# Patient Record
Sex: Female | Born: 1940
Health system: Southern US, Community
[De-identification: ages and names within clinical notes are randomized; demographics above are authoritative.]

## PROBLEM LIST (undated history)

## (undated) DIAGNOSIS — G43909 Migraine, unspecified, not intractable, without status migrainosus: Secondary | ICD-10-CM

## (undated) DIAGNOSIS — D72819 Decreased white blood cell count, unspecified: Secondary | ICD-10-CM

## (undated) DIAGNOSIS — N3281 Overactive bladder: Secondary | ICD-10-CM

## (undated) DIAGNOSIS — D72820 Lymphocytosis (symptomatic): Secondary | ICD-10-CM

## (undated) DIAGNOSIS — M810 Age-related osteoporosis without current pathological fracture: Secondary | ICD-10-CM

## (undated) DIAGNOSIS — I480 Paroxysmal atrial fibrillation: Secondary | ICD-10-CM

## (undated) DIAGNOSIS — I071 Rheumatic tricuspid insufficiency: Secondary | ICD-10-CM

## (undated) DIAGNOSIS — E559 Vitamin D deficiency, unspecified: Secondary | ICD-10-CM

## (undated) DIAGNOSIS — I340 Nonrheumatic mitral (valve) insufficiency: Secondary | ICD-10-CM

## (undated) DIAGNOSIS — Z9289 Personal history of other medical treatment: Secondary | ICD-10-CM

## (undated) DIAGNOSIS — N952 Postmenopausal atrophic vaginitis: Secondary | ICD-10-CM

## (undated) DIAGNOSIS — R079 Chest pain, unspecified: Secondary | ICD-10-CM

## (undated) DIAGNOSIS — F419 Anxiety disorder, unspecified: Secondary | ICD-10-CM

## (undated) DIAGNOSIS — I712 Thoracic aortic aneurysm, without rupture, unspecified: Secondary | ICD-10-CM

## (undated) DIAGNOSIS — E785 Hyperlipidemia, unspecified: Secondary | ICD-10-CM

## (undated) DIAGNOSIS — R7303 Prediabetes: Secondary | ICD-10-CM

## (undated) DIAGNOSIS — I10 Essential (primary) hypertension: Secondary | ICD-10-CM

## (undated) DIAGNOSIS — F32A Depression, unspecified: Secondary | ICD-10-CM

## (undated) DIAGNOSIS — I421 Obstructive hypertrophic cardiomyopathy: Secondary | ICD-10-CM

## (undated) DIAGNOSIS — K635 Polyp of colon: Secondary | ICD-10-CM

## (undated) DIAGNOSIS — I442 Atrioventricular block, complete: Secondary | ICD-10-CM

## (undated) DIAGNOSIS — I447 Left bundle-branch block, unspecified: Secondary | ICD-10-CM

## (undated) DIAGNOSIS — K219 Gastro-esophageal reflux disease without esophagitis: Secondary | ICD-10-CM

## (undated) DIAGNOSIS — I77810 Thoracic aortic ectasia: Secondary | ICD-10-CM

## (undated) DIAGNOSIS — F329 Major depressive disorder, single episode, unspecified: Secondary | ICD-10-CM

## (undated) HISTORY — DX: Lymphocytosis (symptomatic): D72.820

## (undated) HISTORY — DX: Polyp of colon: K63.5

## (undated) HISTORY — DX: Paroxysmal atrial fibrillation: I48.0

## (undated) HISTORY — PX: BREAST EXCISIONAL BIOPSY: SUR124

## (undated) HISTORY — DX: Thoracic aortic aneurysm, without rupture: I71.2

## (undated) HISTORY — DX: Thoracic aortic aneurysm, without rupture, unspecified: I71.20

## (undated) HISTORY — DX: Anxiety disorder, unspecified: F41.9

## (undated) HISTORY — DX: Prediabetes: R73.03

## (undated) HISTORY — PX: HEMORRHOID SURGERY: SHX153

## (undated) HISTORY — PX: TUBAL LIGATION: SHX77

## (undated) HISTORY — DX: Postmenopausal atrophic vaginitis: N95.2

## (undated) HISTORY — DX: Migraine, unspecified, not intractable, without status migrainosus: G43.909

## (undated) HISTORY — PX: BREAST SURGERY: SHX581

## (undated) HISTORY — DX: Overactive bladder: N32.81

## (undated) HISTORY — DX: Personal history of other medical treatment: Z92.89

## (undated) HISTORY — DX: Nonrheumatic mitral (valve) insufficiency: I34.0

## (undated) HISTORY — DX: Rheumatic tricuspid insufficiency: I07.1

## (undated) HISTORY — DX: Decreased white blood cell count, unspecified: D72.819

## (undated) HISTORY — DX: Age-related osteoporosis without current pathological fracture: M81.0

## (undated) HISTORY — DX: Obstructive hypertrophic cardiomyopathy: I42.1

## (undated) HISTORY — DX: Vitamin D deficiency, unspecified: E55.9

## (undated) HISTORY — DX: Thoracic aortic ectasia: I77.810

---

## 1998-07-20 ENCOUNTER — Encounter: Payer: Self-pay | Admitting: Emergency Medicine

## 1998-07-20 ENCOUNTER — Emergency Department (HOSPITAL_COMMUNITY): Admission: EM | Admit: 1998-07-20 | Discharge: 1998-07-20 | Payer: Self-pay | Admitting: Emergency Medicine

## 1998-09-23 ENCOUNTER — Ambulatory Visit (HOSPITAL_COMMUNITY): Admission: RE | Admit: 1998-09-23 | Discharge: 1998-09-23 | Payer: Self-pay | Admitting: Obstetrics and Gynecology

## 1998-09-23 ENCOUNTER — Encounter: Payer: Self-pay | Admitting: Obstetrics and Gynecology

## 1999-06-09 ENCOUNTER — Ambulatory Visit (HOSPITAL_BASED_OUTPATIENT_CLINIC_OR_DEPARTMENT_OTHER): Admission: RE | Admit: 1999-06-09 | Discharge: 1999-06-09 | Payer: Self-pay | Admitting: *Deleted

## 1999-09-21 ENCOUNTER — Ambulatory Visit (HOSPITAL_COMMUNITY): Admission: RE | Admit: 1999-09-21 | Discharge: 1999-09-21 | Payer: Self-pay | Admitting: Internal Medicine

## 1999-09-21 ENCOUNTER — Encounter: Payer: Self-pay | Admitting: Internal Medicine

## 1999-09-22 ENCOUNTER — Other Ambulatory Visit: Admission: RE | Admit: 1999-09-22 | Discharge: 1999-09-22 | Payer: Self-pay | Admitting: Obstetrics and Gynecology

## 1999-09-28 ENCOUNTER — Ambulatory Visit (HOSPITAL_COMMUNITY): Admission: RE | Admit: 1999-09-28 | Discharge: 1999-09-28 | Payer: Self-pay | Admitting: Obstetrics and Gynecology

## 1999-09-28 ENCOUNTER — Encounter: Payer: Self-pay | Admitting: Obstetrics and Gynecology

## 2000-06-20 ENCOUNTER — Encounter: Admission: RE | Admit: 2000-06-20 | Discharge: 2000-06-20 | Payer: Self-pay | Admitting: Obstetrics and Gynecology

## 2000-06-20 ENCOUNTER — Encounter: Payer: Self-pay | Admitting: Obstetrics and Gynecology

## 2000-09-21 ENCOUNTER — Other Ambulatory Visit: Admission: RE | Admit: 2000-09-21 | Discharge: 2000-09-21 | Payer: Self-pay | Admitting: Obstetrics and Gynecology

## 2000-10-04 ENCOUNTER — Ambulatory Visit (HOSPITAL_COMMUNITY): Admission: RE | Admit: 2000-10-04 | Discharge: 2000-10-04 | Payer: Self-pay | Admitting: Obstetrics and Gynecology

## 2000-10-04 ENCOUNTER — Encounter: Payer: Self-pay | Admitting: Obstetrics and Gynecology

## 2001-09-26 ENCOUNTER — Other Ambulatory Visit: Admission: RE | Admit: 2001-09-26 | Discharge: 2001-09-26 | Payer: Self-pay | Admitting: Obstetrics and Gynecology

## 2001-10-08 ENCOUNTER — Ambulatory Visit (HOSPITAL_COMMUNITY): Admission: RE | Admit: 2001-10-08 | Discharge: 2001-10-08 | Payer: Self-pay | Admitting: Obstetrics and Gynecology

## 2001-10-08 ENCOUNTER — Encounter: Payer: Self-pay | Admitting: Obstetrics and Gynecology

## 2001-10-26 ENCOUNTER — Ambulatory Visit (HOSPITAL_COMMUNITY): Admission: RE | Admit: 2001-10-26 | Discharge: 2001-10-26 | Payer: Self-pay | Admitting: Gastroenterology

## 2002-09-02 ENCOUNTER — Emergency Department (HOSPITAL_COMMUNITY): Admission: EM | Admit: 2002-09-02 | Discharge: 2002-09-02 | Payer: Self-pay | Admitting: Emergency Medicine

## 2002-09-02 ENCOUNTER — Encounter: Payer: Self-pay | Admitting: Emergency Medicine

## 2002-10-14 ENCOUNTER — Encounter: Payer: Self-pay | Admitting: Obstetrics and Gynecology

## 2002-10-14 ENCOUNTER — Ambulatory Visit (HOSPITAL_COMMUNITY): Admission: RE | Admit: 2002-10-14 | Discharge: 2002-10-14 | Payer: Self-pay | Admitting: Obstetrics and Gynecology

## 2002-11-14 ENCOUNTER — Other Ambulatory Visit: Admission: RE | Admit: 2002-11-14 | Discharge: 2002-11-14 | Payer: Self-pay | Admitting: Obstetrics and Gynecology

## 2002-11-19 ENCOUNTER — Encounter: Payer: Self-pay | Admitting: Obstetrics and Gynecology

## 2002-11-19 ENCOUNTER — Encounter: Admission: RE | Admit: 2002-11-19 | Discharge: 2002-11-19 | Payer: Self-pay | Admitting: Obstetrics and Gynecology

## 2002-12-24 ENCOUNTER — Encounter: Payer: Self-pay | Admitting: Internal Medicine

## 2002-12-24 ENCOUNTER — Ambulatory Visit (HOSPITAL_COMMUNITY): Admission: RE | Admit: 2002-12-24 | Discharge: 2002-12-24 | Payer: Self-pay | Admitting: Internal Medicine

## 2003-01-10 ENCOUNTER — Encounter: Payer: Self-pay | Admitting: Internal Medicine

## 2003-01-10 ENCOUNTER — Ambulatory Visit (HOSPITAL_COMMUNITY): Admission: RE | Admit: 2003-01-10 | Discharge: 2003-01-10 | Payer: Self-pay | Admitting: Internal Medicine

## 2003-07-15 ENCOUNTER — Encounter: Payer: Self-pay | Admitting: Gastroenterology

## 2003-07-15 ENCOUNTER — Encounter: Admission: RE | Admit: 2003-07-15 | Discharge: 2003-07-15 | Payer: Self-pay | Admitting: Gastroenterology

## 2003-07-31 ENCOUNTER — Ambulatory Visit (HOSPITAL_COMMUNITY): Admission: RE | Admit: 2003-07-31 | Discharge: 2003-07-31 | Payer: Self-pay | Admitting: Gastroenterology

## 2003-10-16 ENCOUNTER — Ambulatory Visit (HOSPITAL_COMMUNITY): Admission: RE | Admit: 2003-10-16 | Discharge: 2003-10-16 | Payer: Self-pay | Admitting: Obstetrics and Gynecology

## 2003-11-17 ENCOUNTER — Other Ambulatory Visit: Admission: RE | Admit: 2003-11-17 | Discharge: 2003-11-17 | Payer: Self-pay | Admitting: Obstetrics and Gynecology

## 2004-01-28 ENCOUNTER — Ambulatory Visit (HOSPITAL_COMMUNITY): Admission: RE | Admit: 2004-01-28 | Discharge: 2004-01-28 | Payer: Self-pay | Admitting: Internal Medicine

## 2004-10-18 ENCOUNTER — Ambulatory Visit (HOSPITAL_COMMUNITY): Admission: RE | Admit: 2004-10-18 | Discharge: 2004-10-18 | Payer: Self-pay | Admitting: Obstetrics and Gynecology

## 2004-11-17 ENCOUNTER — Other Ambulatory Visit: Admission: RE | Admit: 2004-11-17 | Discharge: 2004-11-17 | Payer: Self-pay | Admitting: Obstetrics and Gynecology

## 2005-11-09 ENCOUNTER — Ambulatory Visit (HOSPITAL_COMMUNITY): Admission: RE | Admit: 2005-11-09 | Discharge: 2005-11-09 | Payer: Self-pay | Admitting: Obstetrics and Gynecology

## 2005-11-24 ENCOUNTER — Other Ambulatory Visit: Admission: RE | Admit: 2005-11-24 | Discharge: 2005-11-24 | Payer: Self-pay | Admitting: Obstetrics and Gynecology

## 2006-11-24 ENCOUNTER — Ambulatory Visit (HOSPITAL_COMMUNITY): Admission: RE | Admit: 2006-11-24 | Discharge: 2006-11-24 | Payer: Self-pay | Admitting: Obstetrics and Gynecology

## 2007-01-25 ENCOUNTER — Ambulatory Visit (HOSPITAL_COMMUNITY): Admission: RE | Admit: 2007-01-25 | Discharge: 2007-01-25 | Payer: Self-pay | Admitting: Internal Medicine

## 2007-03-21 ENCOUNTER — Ambulatory Visit (HOSPITAL_COMMUNITY): Admission: RE | Admit: 2007-03-21 | Discharge: 2007-03-21 | Payer: Self-pay | Admitting: Obstetrics and Gynecology

## 2007-11-26 ENCOUNTER — Ambulatory Visit (HOSPITAL_COMMUNITY): Admission: RE | Admit: 2007-11-26 | Discharge: 2007-11-26 | Payer: Self-pay | Admitting: Obstetrics and Gynecology

## 2008-01-28 ENCOUNTER — Other Ambulatory Visit: Admission: RE | Admit: 2008-01-28 | Discharge: 2008-01-28 | Payer: Self-pay | Admitting: Obstetrics and Gynecology

## 2008-05-22 ENCOUNTER — Ambulatory Visit (HOSPITAL_COMMUNITY): Admission: RE | Admit: 2008-05-22 | Discharge: 2008-05-22 | Payer: Self-pay | Admitting: Internal Medicine

## 2008-05-29 ENCOUNTER — Ambulatory Visit (HOSPITAL_COMMUNITY): Admission: RE | Admit: 2008-05-29 | Discharge: 2008-05-29 | Payer: Self-pay | Admitting: Internal Medicine

## 2008-10-08 LAB — HM DEXA SCAN

## 2008-11-28 ENCOUNTER — Ambulatory Visit (HOSPITAL_COMMUNITY): Admission: RE | Admit: 2008-11-28 | Discharge: 2008-11-28 | Payer: Self-pay | Admitting: Obstetrics and Gynecology

## 2009-01-28 ENCOUNTER — Ambulatory Visit: Payer: Self-pay | Admitting: Obstetrics and Gynecology

## 2009-11-30 ENCOUNTER — Ambulatory Visit (HOSPITAL_COMMUNITY): Admission: RE | Admit: 2009-11-30 | Discharge: 2009-11-30 | Payer: Self-pay | Admitting: Obstetrics and Gynecology

## 2010-01-29 ENCOUNTER — Other Ambulatory Visit: Admission: RE | Admit: 2010-01-29 | Discharge: 2010-01-29 | Payer: Self-pay | Admitting: Obstetrics and Gynecology

## 2010-01-29 ENCOUNTER — Ambulatory Visit: Payer: Self-pay | Admitting: Obstetrics and Gynecology

## 2010-02-15 ENCOUNTER — Ambulatory Visit: Payer: Self-pay | Admitting: Obstetrics and Gynecology

## 2010-03-30 ENCOUNTER — Ambulatory Visit: Payer: Self-pay | Admitting: Obstetrics and Gynecology

## 2010-12-02 ENCOUNTER — Ambulatory Visit (HOSPITAL_COMMUNITY)
Admission: RE | Admit: 2010-12-02 | Discharge: 2010-12-02 | Payer: Self-pay | Source: Home / Self Care | Attending: Obstetrics and Gynecology | Admitting: Obstetrics and Gynecology

## 2011-03-25 NOTE — Op Note (Signed)
[place]. [facility]  Patient:    Alexandra Wheeler, Alexandra Wheeler Visit Number: [id number] MRN: [id number]          Service Type: END Location: ENDO Attending Physician:  Rich Brave Dictated by:   Florencia Reasons, M.D. Proc. Date: 10/26/01 Admit Date:  10/26/2001 Discharge Date: 10/26/2001   CC:         Marinus Maw, M.D.   Operative Report  PROCEDURE:  Colonoscopy.  INDICATIONS:  A 70 year old female for colon cancer screening.  FINDINGS:  Normal examination (redundant colon).  DESCRIPTION OF PROCEDURE:  The nature, purpose, and risks of the procedure had been discussed with the patient who provided written consent.  Sedation was Demerol 100 mg and Versed 10 mg IV without arrhythmias or desaturation.  The procedure was initiated with the Olympus pediatric adjustable tension video colonoscope.  However, despite full scope insertion, having the patient in the supine and right lateral decubitus positions, and applying external abdominal compression, I was not able to reach the base of the cecum with that scope or even the level of the ileocecal valve, so the scope was withdrawn.  I saw no abnormalities during withdrawal.  The patient was then recolonoscoped with the adult video colonoscope and this was able to reach the level of the ileocecal valve, as confirmed by transillumination of the right lower quadrant, and I was able to look down into the base of the cecum and see virtually the entire cecal surface area.  So it is not felt that any significant lesions would have been missed.  Pullback was then performed.  There was quite a bit of colonic redundancy and looping noted during this examination.  Otherwise, however, the examination was normal.  No polyps, cancer, colitis, vascular malformations, or diverticulosis were noted.  Retroflexion was attempted in the rectum, but could not be accomplished.  Due to a fairly small rectal ampulla.   No biopsies were obtained. The patient tolerated the procedure well and there were no apparent complications.  IMPRESSION:  Redundant colon, but otherwise normal examination.  PLAN:  Consider follow-up screening colonoscopy in five years. Dictated by:   Florencia Reasons, M.D. Attending Physician:  Rich Brave DD:  10/26/01 TD:  10/28/01 Job: [id number]

## 2011-03-25 NOTE — Op Note (Signed)
   NAME:  Alexandra Wheeler, HUSH                        ACCOUNT NO.:  [id number]   MEDICAL RECORD NO.:  [id number]                   PATIENT TYPE:  AMB   LOCATION:  ENDO                                 FACILITY:  [facility]   PHYSICIAN:  Bernette Redbird, M.D.                DATE OF BIRTH:  1941/10/01   DATE OF PROCEDURE:  07/31/2003  DATE OF DISCHARGE:                                 OPERATIVE REPORT   PROCEDURE:  Upper endoscopy.   INDICATION:  A 70 year old female with recurring chest pain unresponsive to  PPI therapy.   FINDINGS:  Possible laryngeal inflammation, otherwise normal exam.   DESCRIPTION OF PROCEDURE:  The nature, purpose, and risks of the procedure  were familiar to the patient from prior examination.  She provided written  consent.  Sedation was fentanyl 75 mcg and Versed 7 mg IV without  arrhythmias or desaturation.  The Olympus small-caliber adult video  endoscope was passed easily under direct vision.   The vocal cords, in particular the false cords, appeared prominent without  any discrete lesions, and the posterior portion of the larynx appeared boggy  and edematous.   The esophagus was entered under direct vision without significant difficulty  and had normal mucosa, no evidence of spasm, no reflux esophagitis, no free  reflux, no Barrett's esophagus, no varices, infection, or neoplasia and no  ring, stricture, or hiatal hernia.   The stomach contained no significant residual and had normal mucosa without  evidence of gastritis, erosions, ulcers, polyps, or masses including  retroflexed view of the proximal stomach.  The pylorus and duodenum appeared  to __________ to one foreshortened channel, although there was not classic  peptic scarring present.  The second portion of the duodenum looked normal.   The scope was removed from the patient.  No biopsies were obtained.  She  tolerated the procedure well, and there were no apparent complications.   IMPRESSION:   Essentially normal exam except for possible chronic laryngeal  inflammation.    PLAN:  Continue proton pump inhibitor therapy for now.  Consider possible  ENT evaluation.  Since no source of chest pain was identified on this exam,  the patient will need to continue to be followed by her cardiologist and her  primary care physician.                                               Bernette Redbird, M.D.    RB/MEDQ  D:  07/31/2003  T:  07/31/2003  Job:  [id number]   cc:   Lucky Cowboy, M.D.  [address]  [place], [place] [address]  Fax: [phone]   Nicki Guadalajara, M.D.  [phone] [address]  [place], [place] [address]  Fax: [phone]

## 2011-04-05 ENCOUNTER — Encounter (INDEPENDENT_AMBULATORY_CARE_PROVIDER_SITE_OTHER): Payer: Medicare PPO | Admitting: Obstetrics and Gynecology

## 2011-04-05 DIAGNOSIS — N3941 Urge incontinence: Secondary | ICD-10-CM

## 2011-04-05 DIAGNOSIS — M810 Age-related osteoporosis without current pathological fracture: Secondary | ICD-10-CM

## 2011-04-05 DIAGNOSIS — N952 Postmenopausal atrophic vaginitis: Secondary | ICD-10-CM

## 2011-04-05 DIAGNOSIS — N393 Stress incontinence (female) (male): Secondary | ICD-10-CM

## 2011-06-08 ENCOUNTER — Encounter: Payer: Self-pay | Admitting: Obstetrics and Gynecology

## 2011-06-10 ENCOUNTER — Telehealth: Payer: Self-pay

## 2011-06-10 ENCOUNTER — Encounter: Payer: Self-pay | Admitting: Obstetrics and Gynecology

## 2011-06-10 NOTE — Telephone Encounter (Signed)
Message copied by Venora Maples on Fri Jun 10, 2011  3:14 PM ------      Message from: Trellis Paganini      Created: Fri Jun 10, 2011  1:01 PM       Urine culture is normal. Is she symptomatic?

## 2011-06-10 NOTE — Telephone Encounter (Signed)
PT. NOTIFIED BY VOICEMAIL  PER DR. GOTTSEGENS NOTE BELOW & TOLD HER TO CALL BACK & LET ME KNOW EITHER WAY.

## 2011-11-17 ENCOUNTER — Other Ambulatory Visit: Payer: Self-pay | Admitting: Obstetrics and Gynecology

## 2011-11-17 DIAGNOSIS — Z1231 Encounter for screening mammogram for malignant neoplasm of breast: Secondary | ICD-10-CM

## 2011-12-15 ENCOUNTER — Ambulatory Visit (HOSPITAL_COMMUNITY)
Admission: RE | Admit: 2011-12-15 | Discharge: 2011-12-15 | Disposition: A | Discharge status: Home / Self Care | Payer: Medicare PPO | Source: Ambulatory Visit | Attending: Obstetrics and Gynecology | Admitting: Obstetrics and Gynecology

## 2011-12-15 DIAGNOSIS — Z1231 Encounter for screening mammogram for malignant neoplasm of breast: Secondary | ICD-10-CM

## 2012-09-25 ENCOUNTER — Encounter: Payer: Self-pay | Admitting: Gynecology

## 2012-09-25 DIAGNOSIS — N952 Postmenopausal atrophic vaginitis: Secondary | ICD-10-CM | POA: Insufficient documentation

## 2012-09-25 DIAGNOSIS — N3281 Overactive bladder: Secondary | ICD-10-CM | POA: Insufficient documentation

## 2012-09-25 DIAGNOSIS — M810 Age-related osteoporosis without current pathological fracture: Secondary | ICD-10-CM | POA: Insufficient documentation

## 2012-10-03 ENCOUNTER — Ambulatory Visit (INDEPENDENT_AMBULATORY_CARE_PROVIDER_SITE_OTHER): Payer: Medicare PPO | Admitting: Obstetrics and Gynecology

## 2012-10-03 ENCOUNTER — Encounter: Payer: Self-pay | Admitting: Obstetrics and Gynecology

## 2012-10-03 VITALS — BP 114/72 | Ht 65.75 in | Wt 153.0 lb

## 2012-10-03 DIAGNOSIS — M810 Age-related osteoporosis without current pathological fracture: Secondary | ICD-10-CM

## 2012-10-03 DIAGNOSIS — R300 Dysuria: Secondary | ICD-10-CM

## 2012-10-03 DIAGNOSIS — R3915 Urgency of urination: Secondary | ICD-10-CM

## 2012-10-03 DIAGNOSIS — N952 Postmenopausal atrophic vaginitis: Secondary | ICD-10-CM

## 2012-10-03 LAB — URINALYSIS W MICROSCOPIC + REFLEX CULTURE
Bilirubin Urine: NEGATIVE
Casts: NONE SEEN
Crystals: NONE SEEN
Glucose, UA: NEGATIVE mg/dL
Ketones, ur: NEGATIVE mg/dL
Nitrite: NEGATIVE
Protein, ur: NEGATIVE mg/dL
Specific Gravity, Urine: 1.015 (ref 1.005–1.030)
Urobilinogen, UA: 0.2 mg/dL (ref 0.0–1.0)
pH: 7.5 (ref 5.0–8.0)

## 2012-10-03 MED ORDER — NITROFURANTOIN MONOHYD MACRO 100 MG PO CAPS: 100.0000 mg | ORAL_CAPSULE | Freq: Two times a day (BID) | ORAL | Status: DC

## 2012-10-03 NOTE — Progress Notes (Signed)
Patient came to see me today. She is complaining of severe dysuria and vaginal burning. She had an abnormal urinalysis. She also has urinary urgency which has been a long-term problem. She is not on medication at the moment for this. She does not have hematuria or incontinence. She she does have vaginal burning. She is not sexually active and therefore does not have dyspareunia. She is having no vaginal bleeding or pelvic pain. She is up-to-date on her mammograms.She previously took  Evista and Fosamax for osteoporosis. She did not take her Fosamax long because of side effects. She tried one year of IV Reclast but had severe joint pain and would not do it again. We have discussed Prolia but she did not follow through. Her last bone density was 2004 and she has not had one since then even though we have requested her to have followup. She has had no fractures. She has always had normal Pap smears. Her last Pap smear was 2011.  ROS: 12 system review done. Pertinent positives above.  Physical examination:Alexandra Wheeler present. HEENT within normal limits. Neck: Thyroid not large. No masses. Supraclavicular nodes: not enlarged. Breasts: Examined in both sitting and lying  position. No skin changes and no masses. Abdomen: Soft no guarding rebound or masses or hernia. Pelvic: External: Within normal limits. BUS: Within normal limits. Vaginal:within normal limits. Poor  estrogen effect. No evidence of cystocele rectocele or enterocele. Cervix: clean. Uterus: Normal size and shape. Adnexa: No masses. Rectovaginal exam: Confirmatory and negative. Extremities: Within normal limits.  Assessment: #1. Urinary tract infection #2. Atrophic vaginitis #3. Osteoporosis #4. Detrussor instability  Plan: Macrobid twice a day for 7 days. Followup urinalysis. Hyalo  GYN gel. Bone density. Discussed Prolia based on bone density results. Patient will call for DI treatment when necessary. Pap not done.The new Pap smear  guidelines were discussed with the patient.

## 2012-10-03 NOTE — Patient Instructions (Signed)
Schedule  bone density. Return in one week for urinalysis.

## 2012-10-04 LAB — URINE CULTURE: Colony Count: 4000

## 2012-10-08 ENCOUNTER — Encounter: Payer: Self-pay | Admitting: Obstetrics and Gynecology

## 2012-10-18 ENCOUNTER — Encounter: Payer: Self-pay | Admitting: Obstetrics and Gynecology

## 2012-10-19 ENCOUNTER — Other Ambulatory Visit: Payer: Self-pay | Admitting: Internal Medicine

## 2012-10-19 DIAGNOSIS — R531 Weakness: Secondary | ICD-10-CM

## 2012-10-19 DIAGNOSIS — R410 Disorientation, unspecified: Secondary | ICD-10-CM

## 2012-10-24 ENCOUNTER — Ambulatory Visit
Admission: RE | Admit: 2012-10-24 | Discharge: 2012-10-24 | Disposition: A | Discharge status: Home / Self Care | Payer: Medicare PPO | Source: Ambulatory Visit | Attending: Internal Medicine | Admitting: Internal Medicine

## 2012-10-24 DIAGNOSIS — R410 Disorientation, unspecified: Secondary | ICD-10-CM

## 2012-10-24 DIAGNOSIS — R531 Weakness: Secondary | ICD-10-CM

## 2012-11-15 ENCOUNTER — Encounter: Payer: Self-pay | Admitting: Cardiology

## 2012-11-15 ENCOUNTER — Ambulatory Visit (INDEPENDENT_AMBULATORY_CARE_PROVIDER_SITE_OTHER): Payer: Medicare PPO | Admitting: Cardiology

## 2012-11-15 VITALS — BP 120/86 | HR 60 | Ht 65.75 in | Wt 153.0 lb

## 2012-11-15 DIAGNOSIS — R011 Cardiac murmur, unspecified: Secondary | ICD-10-CM

## 2012-11-15 DIAGNOSIS — R079 Chest pain, unspecified: Secondary | ICD-10-CM

## 2012-11-15 DIAGNOSIS — I447 Left bundle-branch block, unspecified: Secondary | ICD-10-CM | POA: Insufficient documentation

## 2012-11-15 NOTE — Progress Notes (Signed)
Patient ID: Alexandra Wheeler, female   DOB: 11-Nov-1940, 72 y.o.   MRN: [id number] PCP: Dr. Oneta Rack  72 yo with history of chronic LBBB presents for cardiology evaluation.  She has two main complaints: left hand tingling and left lateral chest pain.  She will get tingling from time to time only in her left hand.  It feels numb.  This is not associated with exertion.  She also has pain in her left lateral chest and shoulder that occurs every 1-2 weeks. There is no definite trigger.  It is not exertional.  She also reports significant generalized fatigue that has begun over the last few month.  Lab evaluation at PCP's office showed normal hemoglobin and TSH.  She has a LBBB on ECG today but this has been seen on prior ECGs.  No exertional dyspnea but not much formal exercise.   ECG: NSR, LBBB  Labs (12/13): K 4.1, creatinine 0.67, HCT 38.1, TSH normal, LDL 74, HDL 52, TGs 68  PMH: 1. LBBB 2. GERD 3. HTN: Not currently on medications.  4. Depression 5. Hyperlipidemia  SH: Lives in [place].  Quit smoking in 1990s.  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    FH: 2 brothers with CABG in their 73s, mother with PCM.  ROS: All systems reviewed and negative except as per HPI.   Current Outpatient Prescriptions  Medication Sig Dispense Refill  . ALPRAZolam (XANAX) 0.5 MG tablet Take 0.5 mg by mouth at bedtime as needed.      . Ascorbic Acid (VITAMIN C PO) Take by mouth daily.      Marland Kitchen BIOTIN PO Take by mouth daily.      . Calcium Carbonate-Vitamin D (CALCIUM + D PO) Take by mouth.      . Cholecalciferol (VITAMIN D PO) Take by mouth.      . citalopram (CELEXA) 10 MG tablet Take 10 mg by mouth daily.      Marland Kitchen FOLIC ACID PO Take by mouth daily.      Marland Kitchen MAGNESIUM PO Take by mouth.      . Multiple Vitamin (MULTIVITAMIN) tablet Take 1 tablet by mouth daily.      . Omega-3 Fatty Acids (FISH OIL PO) Take by mouth.      . Red Yeast Rice Extract (RED YEAST RICE PO) Take by mouth.        BP  120/86  Pulse 60  Ht 5' 5.75" (1.67 m)  Wt 153 lb (69.4 kg)  BMI 24.88 kg/m2  SpO2 96% General: NAD Neck: No JVD, no thyromegaly or thyroid nodule.  Lungs: Clear to auscultation bilaterally with normal respiratory effort. CV: Nondisplaced PMI.  Heart regular S1/S2, no S3/S4, 2/6 early systolic murmur RUSB.  No peripheral edema.  No carotid bruit.  Normal pedal pulses.  Abdomen: Soft, nontender, no hepatosplenomegaly, no distention.  Skin: Intact without lesions or rashes.  Neurologic: Alert and oriented x 3.  Psych: Normal affect. Extremities: No clubbing or cyanosis.  HEENT: Normal.   Assessment/Plan: 1. Murmur: Patient has an aortic-area systolic murmur.  I will get an echocardiogram to assess for aortic valve pathology.  2. LBBB: It appears that this is chronic and unchanged compared to prior ECGs.  3. Chest pain: Patient has fairly frequent episodes of atypical chest pain.  These episodes concern her because she had 2 brothers with CABG in their 32s.  I think that it would be reasonable to set her up for an adenosine Sestamibi given her  chest pain and fatigue.  Will need to be pharmacologic test due to LBBB.   Marca Ancona 11/15/2012 1:24 PM

## 2012-11-15 NOTE — Patient Instructions (Addendum)
Your physician has requested that you have an adenosine myoview. For further information please visit [email or web address] Please follow instruction sheet, as given.  Your physician has requested that you have an echocardiogram. Echocardiography is a painless test that uses sound waves to create images of your heart. It provides your doctor with information about the size and shape of your heart and how well your heart's chambers and valves are working. This procedure takes approximately one hour. There are no restrictions for this procedure.  Your physician recommends that you schedule a follow-up appointment in: 2-3 weeks with Dr Shirlee Latch.

## 2012-11-26 ENCOUNTER — Ambulatory Visit (HOSPITAL_COMMUNITY): Payer: Medicare PPO | Attending: Cardiology | Admitting: Radiology

## 2012-11-26 VITALS — Ht 65.0 in | Wt 151.0 lb

## 2012-11-26 DIAGNOSIS — I10 Essential (primary) hypertension: Secondary | ICD-10-CM | POA: Insufficient documentation

## 2012-11-26 DIAGNOSIS — R0609 Other forms of dyspnea: Secondary | ICD-10-CM | POA: Insufficient documentation

## 2012-11-26 DIAGNOSIS — R079 Chest pain, unspecified: Secondary | ICD-10-CM | POA: Insufficient documentation

## 2012-11-26 DIAGNOSIS — R002 Palpitations: Secondary | ICD-10-CM | POA: Insufficient documentation

## 2012-11-26 DIAGNOSIS — I447 Left bundle-branch block, unspecified: Secondary | ICD-10-CM | POA: Insufficient documentation

## 2012-11-26 DIAGNOSIS — R42 Dizziness and giddiness: Secondary | ICD-10-CM | POA: Insufficient documentation

## 2012-11-26 DIAGNOSIS — R0989 Other specified symptoms and signs involving the circulatory and respiratory systems: Secondary | ICD-10-CM | POA: Insufficient documentation

## 2012-11-26 MED ORDER — TECHNETIUM TC 99M SESTAMIBI GENERIC - CARDIOLITE
11.0000 | Freq: Once | INTRAVENOUS | Status: AC | PRN
  Administered 2012-11-26: 11 via INTRAVENOUS

## 2012-11-26 MED ORDER — ADENOSINE (DIAGNOSTIC) 3 MG/ML IV SOLN
0.5600 mg/kg | Freq: Once | INTRAVENOUS | Status: AC
  Administered 2012-11-26: 38.4 mg via INTRAVENOUS

## 2012-11-26 MED ORDER — TECHNETIUM TC 99M SESTAMIBI GENERIC - CARDIOLITE
33.0000 | Freq: Once | INTRAVENOUS | Status: AC | PRN
  Administered 2012-11-26: 33 via INTRAVENOUS

## 2012-11-26 NOTE — Progress Notes (Addendum)
[facility] SITE 3 NUCLEAR MED [address] [place], [place] [address] [phone]    Cardiology Nuclear Med Study  Alexandra Wheeler is a 72 y.o. female     MRN : [id number]     DOB: 07/07/1941  Procedure Date: 11/26/2012  Nuclear Med Background Indication for Stress Test:  Evaluation for Ischemia History:  6 yrs ago MPS: ok Cardiac Risk Factors: Family History - CAD, History of Smoking, Hypertension, LBBB and Lipids  Symptoms:  Chest Pain, Dizziness, DOE, Fatigue, Fatigue with Exertion, Light-Headedness, Nausea, Near Syncope, Palpitations, Rapid HR and SOB   Nuclear Pre-Procedure Caffeine/Decaff Intake:  None > 12 hrs NPO After: 9:00pm   Lungs: clear O2 Sat: 95% on room air IV 0.9% NS with Angio Cath:  20g  IV Site: R Antecubital x 1, tolerated well IV Started by:  Irean Hong, RN  Chest Size (in):  34 Cup Size: C  Height: 5\' 5"  (1.651 m)  Weight:  151 lb (68.493 kg)  BMI:  Body mass index is 25.13 kg/(m^2). Tech Comments:  n/a    Nuclear Med Study 1 or 2 day study: 1 day  Stress Test Type:  Adenosine  Reading MD: Marca Ancona, MD  Order Authorizing Provider:  Marca Ancona, MD  Resting Radionuclide: Technetium 31m Sestamibi  Resting Radionuclide Dose: 11.0 mCi   Stress Radionuclide:  Technetium 14m Sestamibi  Stress Radionuclide Dose: 33.0 mCi           Stress Protocol Rest HR: 50 Stress HR: 65  Rest BP: 119/90 Stress BP: 145/87  Exercise Time (min): n/a METS: n/a   Predicted Max HR: 148 bpm % Max HR: 43.92 bpm Rate Pressure Product: 9425    Dose of Adenosine (mg):  38.4 Dose of Lexiscan: n/a mg  Dose of Atropine (mg): n/a Dose of Dobutamine: n/a mcg/kg/min (at max HR)  Stress Test Technologist: Milana Na, EMT-P  Nuclear Technologist:  Domenic Polite, CNMT     Rest Procedure:  Myocardial perfusion imaging was performed at rest 45 minutes following the intravenous administration of Technetium 76m Sestamibi. Rest ECG: NSR-LBBB and  NSR-RBBB  Stress Procedure:  The patient received IV adenosine at 140 mcg/kg/min for 4 minutes. No changes. Technetium 72m Sestamibi was injected at the 2 minute mark and quantitative spect images were obtained after a 45 minute delay. Stress ECG: Uninteretable due to baseline LBBB  QPS Raw Data Images:  Normal; no motion artifact; normal heart/lung ratio. Stress Images:  Normal homogeneous uptake in all areas of the myocardium. Rest Images:  Normal homogeneous uptake in all areas of the myocardium. Subtraction (SDS):  There is no evidence of scar or ischemia. Transient Ischemic Dilatation (Normal <1.22):  1.04 Lung/Heart Ratio (Normal <0.45):  0.30  Quantitative Gated Spect Images QGS EDV:  107 ml QGS ESV:  40 ml  Impression Exercise Capacity:  Adenosine study with no exercise. BP Response:  Normal blood pressure response. Clinical Symptoms:  Chest tightness.  ECG Impression:  Baseline:  LBBB.  EKG uninterpretable due to LBBB at rest and stress. Comparison with Prior Nuclear Study: No images to compare  Overall Impression:  Normal stress nuclear study.  LV Ejection Fraction: 63%.  LV Wall Motion:  NL LV Function; NL Wall Motion  Marca Ancona 11/26/2012  Normal study, please inform patient  Marca Ancona 11/28/2012

## 2012-11-27 ENCOUNTER — Ambulatory Visit (HOSPITAL_COMMUNITY): Payer: Medicare PPO | Attending: Cardiology | Admitting: Radiology

## 2012-11-27 DIAGNOSIS — I447 Left bundle-branch block, unspecified: Secondary | ICD-10-CM

## 2012-11-27 DIAGNOSIS — Z87891 Personal history of nicotine dependence: Secondary | ICD-10-CM | POA: Insufficient documentation

## 2012-11-27 DIAGNOSIS — R072 Precordial pain: Secondary | ICD-10-CM | POA: Insufficient documentation

## 2012-11-27 DIAGNOSIS — R011 Cardiac murmur, unspecified: Secondary | ICD-10-CM

## 2012-11-27 DIAGNOSIS — I10 Essential (primary) hypertension: Secondary | ICD-10-CM | POA: Insufficient documentation

## 2012-11-27 DIAGNOSIS — R079 Chest pain, unspecified: Secondary | ICD-10-CM

## 2012-11-27 DIAGNOSIS — E785 Hyperlipidemia, unspecified: Secondary | ICD-10-CM | POA: Insufficient documentation

## 2012-11-27 NOTE — Progress Notes (Signed)
Echocardiogram performed.  

## 2012-11-28 ENCOUNTER — Encounter: Payer: Self-pay | Admitting: Cardiology

## 2012-11-28 NOTE — Progress Notes (Signed)
LMTCB 11/28/12

## 2012-11-29 ENCOUNTER — Telehealth: Payer: Self-pay | Admitting: Cardiology

## 2012-11-29 NOTE — Telephone Encounter (Signed)
Pt given results of stress test and echo.

## 2012-11-29 NOTE — Telephone Encounter (Signed)
Pt rtn call to Thurston Hole from yesterday to get test results

## 2012-12-03 ENCOUNTER — Telehealth: Payer: Self-pay | Admitting: Cardiology

## 2012-12-03 NOTE — Telephone Encounter (Signed)
New Problem:    Patient called in wanting to know the results of her recent cardiac testing again.  Please call back.

## 2012-12-03 NOTE — Telephone Encounter (Signed)
Spoke with pt about recent myoview and echo results.

## 2012-12-03 NOTE — Progress Notes (Signed)
Pt.notified

## 2012-12-05 ENCOUNTER — Ambulatory Visit: Payer: Medicare PPO | Admitting: Cardiology

## 2012-12-10 ENCOUNTER — Telehealth: Payer: Self-pay | Admitting: Cardiology

## 2012-12-10 NOTE — Telephone Encounter (Signed)
New Problem: ° ° ° °Patient returned your call.  Please call back. °

## 2012-12-10 NOTE — Telephone Encounter (Signed)
LMTCB

## 2012-12-11 ENCOUNTER — Encounter: Payer: Self-pay | Admitting: Cardiology

## 2012-12-12 NOTE — Telephone Encounter (Signed)
Spoke with pt about appt 

## 2012-12-12 NOTE — Telephone Encounter (Signed)
LMTCB

## 2012-12-25 ENCOUNTER — Ambulatory Visit (INDEPENDENT_AMBULATORY_CARE_PROVIDER_SITE_OTHER): Payer: Medicare PPO | Admitting: Cardiology

## 2012-12-25 ENCOUNTER — Encounter: Payer: Self-pay | Admitting: Cardiology

## 2012-12-25 VITALS — BP 112/68 | HR 72 | Ht 65.75 in | Wt 157.0 lb

## 2012-12-25 DIAGNOSIS — R079 Chest pain, unspecified: Secondary | ICD-10-CM

## 2012-12-25 DIAGNOSIS — R011 Cardiac murmur, unspecified: Secondary | ICD-10-CM

## 2012-12-25 DIAGNOSIS — I059 Rheumatic mitral valve disease, unspecified: Secondary | ICD-10-CM

## 2012-12-25 DIAGNOSIS — I447 Left bundle-branch block, unspecified: Secondary | ICD-10-CM

## 2012-12-25 MED ORDER — METOPROLOL TARTRATE 25 MG PO TABS: ORAL_TABLET | ORAL | Status: DC

## 2012-12-25 NOTE — Patient Instructions (Addendum)
Take lopressor (metoprolol tartrate) 12.5 mg two times a day. This will be 1/2 of a 25mg  tablet two times a day.  Your physician wants you to follow-up in: 1 year with Dr Shirlee Latch. (February 2015). You will receive a reminder letter in the mail two months in advance. If you don't receive a letter, please call our office to schedule the follow-up appointment.

## 2012-12-26 NOTE — Progress Notes (Signed)
Patient ID: Alexandra Wheeler, female   DOB: 27-Jul-1941, 72 y.o.   MRN: [id number] PCP: Dr. Oneta Rack  72 yo with history of chronic LBBB presents for cardiology evaluation.  At last appointment, she had two main complaints: left hand tingling and left lateral chest pain.  She will get tingling from time to time only in her left hand.  It feels numb.  This is not associated with exertion.  She also has pain in her left lateral chest and shoulder that occurs every 1-2 weeks. There is no definite trigger.  It is not exertional.  She also reports significant generalized fatigue that has begun over the last few months.  Lab evaluation at PCP's office showed normal hemoglobin and TSH.  She has a chronic LBBB.  No exertional dyspnea but not much formal exercise.   Given the chest pain, I had her get an adenosine sestamibi.  This was a normal study.  I also heard a murmur at last appointment so had her do an echocardiogram.  This showed normal EF but mild focal basal septal hypertrophy with mild systolic anterior motion of the anterior mitral leaflet leading to a mild LV outflow tract gradient.    ECG: NSR, LBBB  Labs (12/13): K 4.1, creatinine 0.67, HCT 38.1, TSH normal, LDL 74, HDL 52, TGs 68  PMH: 1. LBBB 2. GERD 3. HTN: Not currently on medications.  4. Depression 5. Hyperlipidemia 6. Chest pain: Adenosine sestamibi (1/14) with no evidence of ischemia or infarction, EF 63%.  7. Asymmetric focal basal septal hypertrophy: Echo (1/14) with EF 55%, mild asymmetric focal basal septal hypertrophy with mild SAM and mild MR, LVOT peak gradient 15 mmHg.   SH: Lives in [place].  Quit smoking in 1990s.  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    FH: 2 brothers with CABG in their 78s, mother with PCM.  ROS: All systems reviewed and negative except as per HPI.   Current Outpatient Prescriptions  Medication Sig Dispense Refill  . ALPRAZolam (XANAX) 0.5 MG tablet Take 0.5 mg by mouth at  bedtime as needed.      . Ascorbic Acid (VITAMIN C PO) Take by mouth daily.      Marland Kitchen BIOTIN PO Take by mouth daily.      . Calcium Carbonate-Vitamin D (CALCIUM + D PO) Take by mouth.      . Cholecalciferol (VITAMIN D PO) Take by mouth.      . citalopram (CELEXA) 10 MG tablet Take 10 mg by mouth daily.      Marland Kitchen FOLIC ACID PO Take by mouth daily.      Marland Kitchen MAGNESIUM PO Take by mouth.      . Multiple Vitamin (MULTIVITAMIN) tablet Take 1 tablet by mouth daily.      . Omega-3 Fatty Acids (FISH OIL PO) Take by mouth.      . Red Yeast Rice Extract (RED YEAST RICE PO) Take by mouth.      . metoprolol tartrate (LOPRESSOR) 25 MG tablet 1/2 tablet (total 12.5mg ) two times a day  90 tablet  3   No current facility-administered medications for this visit.    BP 112/68  Pulse 72  Ht 5' 5.75" (1.67 m)  Wt 157 lb (71.215 kg)  BMI 25.54 kg/m2 General: NAD Neck: No JVD, no thyromegaly or thyroid nodule.  Lungs: Clear to auscultation bilaterally with normal respiratory effort. CV: Nondisplaced PMI.  Heart regular S1/S2, no S3/S4, 2/6 early systolic murmur RUSB.  No peripheral edema.  No carotid bruit.  Normal pedal pulses.  Abdomen: Soft, nontender, no hepatosplenomegaly, no distention.  Neurologic: Alert and oriented x 3.  Psych: Normal affect. Extremities: No clubbing or cyanosis.   Assessment/Plan: 1. Murmur: Patient has an aortic-area systolic murmur.  This likely originates from her mild LV outflow tract gradient.  She has mild focal basal septal hypertrophy on echo with peak LVOT gradient to 15 mmHg at rest and mild mitral SAM with mild MR.  This may be a relatively benign sigmoid septum variant often seen in older women.  Cannot rule out hypertrophic cardiomyopathy variant though she has had no clinical evidence of this from the past.  I suggested that she start a beta blocker to decrease the degree of LVOT obstruction.  She is willing to start a low dose, will use metoprolol 12.5 mg bid.   2. LBBB: It  appears that this is chronic and unchanged compared to prior ECGs.  3. Chest pain: Atypical.  Adenosine sestamibi was normal.    Marca Ancona 12/26/2012 11:48 AM

## 2013-01-07 ENCOUNTER — Ambulatory Visit: Payer: Medicare PPO | Admitting: Cardiology

## 2013-01-16 ENCOUNTER — Other Ambulatory Visit (HOSPITAL_COMMUNITY): Payer: Self-pay | Admitting: Internal Medicine

## 2013-01-16 DIAGNOSIS — Z1231 Encounter for screening mammogram for malignant neoplasm of breast: Secondary | ICD-10-CM

## 2013-01-23 ENCOUNTER — Ambulatory Visit (HOSPITAL_COMMUNITY)
Admission: RE | Admit: 2013-01-23 | Discharge: 2013-01-23 | Disposition: A | Discharge status: Home / Self Care | Payer: Medicare PPO | Source: Ambulatory Visit | Attending: Internal Medicine | Admitting: Internal Medicine

## 2013-01-23 DIAGNOSIS — Z1231 Encounter for screening mammogram for malignant neoplasm of breast: Secondary | ICD-10-CM | POA: Insufficient documentation

## 2013-06-07 DIAGNOSIS — I442 Atrioventricular block, complete: Secondary | ICD-10-CM

## 2013-06-07 HISTORY — DX: Atrioventricular block, complete: I44.2

## 2013-06-10 ENCOUNTER — Encounter (HOSPITAL_COMMUNITY): Payer: Self-pay | Admitting: Physician Assistant

## 2013-06-10 ENCOUNTER — Emergency Department (HOSPITAL_COMMUNITY): Payer: Medicare PPO

## 2013-06-10 ENCOUNTER — Telehealth: Payer: Self-pay | Admitting: *Deleted

## 2013-06-10 ENCOUNTER — Inpatient Hospital Stay (HOSPITAL_COMMUNITY)
Admission: EM | Admit: 2013-06-10 | Discharge: 2013-06-12 | DRG: 244 | Disposition: A | Discharge status: Home / Self Care | Payer: Medicare PPO | Attending: Cardiovascular Disease | Admitting: Cardiovascular Disease

## 2013-06-10 ENCOUNTER — Inpatient Hospital Stay (HOSPITAL_COMMUNITY): Payer: Medicare PPO

## 2013-06-10 DIAGNOSIS — I10 Essential (primary) hypertension: Secondary | ICD-10-CM

## 2013-06-10 DIAGNOSIS — Z833 Family history of diabetes mellitus: Secondary | ICD-10-CM

## 2013-06-10 DIAGNOSIS — R079 Chest pain, unspecified: Secondary | ICD-10-CM

## 2013-06-10 DIAGNOSIS — I442 Atrioventricular block, complete: Secondary | ICD-10-CM

## 2013-06-10 DIAGNOSIS — Z803 Family history of malignant neoplasm of breast: Secondary | ICD-10-CM

## 2013-06-10 DIAGNOSIS — R011 Cardiac murmur, unspecified: Secondary | ICD-10-CM

## 2013-06-10 DIAGNOSIS — Z79899 Other long term (current) drug therapy: Secondary | ICD-10-CM

## 2013-06-10 DIAGNOSIS — K219 Gastro-esophageal reflux disease without esophagitis: Secondary | ICD-10-CM | POA: Diagnosis present

## 2013-06-10 DIAGNOSIS — I517 Cardiomegaly: Secondary | ICD-10-CM

## 2013-06-10 DIAGNOSIS — M810 Age-related osteoporosis without current pathological fracture: Secondary | ICD-10-CM | POA: Diagnosis present

## 2013-06-10 DIAGNOSIS — I447 Left bundle-branch block, unspecified: Secondary | ICD-10-CM | POA: Diagnosis present

## 2013-06-10 DIAGNOSIS — E785 Hyperlipidemia, unspecified: Secondary | ICD-10-CM | POA: Diagnosis present

## 2013-06-10 DIAGNOSIS — R748 Abnormal levels of other serum enzymes: Secondary | ICD-10-CM | POA: Diagnosis present

## 2013-06-10 DIAGNOSIS — N3281 Overactive bladder: Secondary | ICD-10-CM

## 2013-06-10 DIAGNOSIS — N952 Postmenopausal atrophic vaginitis: Secondary | ICD-10-CM

## 2013-06-10 DIAGNOSIS — R7989 Other specified abnormal findings of blood chemistry: Secondary | ICD-10-CM

## 2013-06-10 DIAGNOSIS — Z87891 Personal history of nicotine dependence: Secondary | ICD-10-CM

## 2013-06-10 DIAGNOSIS — Z8249 Family history of ischemic heart disease and other diseases of the circulatory system: Secondary | ICD-10-CM

## 2013-06-10 DIAGNOSIS — F411 Generalized anxiety disorder: Secondary | ICD-10-CM | POA: Diagnosis present

## 2013-06-10 DIAGNOSIS — Z602 Problems related to living alone: Secondary | ICD-10-CM

## 2013-06-10 DIAGNOSIS — F329 Major depressive disorder, single episode, unspecified: Secondary | ICD-10-CM | POA: Diagnosis present

## 2013-06-10 DIAGNOSIS — Z9851 Tubal ligation status: Secondary | ICD-10-CM

## 2013-06-10 DIAGNOSIS — I2510 Atherosclerotic heart disease of native coronary artery without angina pectoris: Secondary | ICD-10-CM | POA: Diagnosis present

## 2013-06-10 DIAGNOSIS — F3289 Other specified depressive episodes: Secondary | ICD-10-CM | POA: Diagnosis present

## 2013-06-10 HISTORY — DX: Gastro-esophageal reflux disease without esophagitis: K21.9

## 2013-06-10 HISTORY — DX: Chest pain, unspecified: R07.9

## 2013-06-10 HISTORY — DX: Essential (primary) hypertension: I10

## 2013-06-10 HISTORY — DX: Atrioventricular block, complete: I44.2

## 2013-06-10 HISTORY — DX: Depression, unspecified: F32.A

## 2013-06-10 HISTORY — DX: Hyperlipidemia, unspecified: E78.5

## 2013-06-10 HISTORY — DX: Left bundle-branch block, unspecified: I44.7

## 2013-06-10 HISTORY — DX: Major depressive disorder, single episode, unspecified: F32.9

## 2013-06-10 HISTORY — DX: Personal history of other medical treatment: Z92.89

## 2013-06-10 LAB — COMPREHENSIVE METABOLIC PANEL
AST: 52 U/L — ABNORMAL HIGH (ref 0–37)
CO2: 27 mEq/L (ref 19–32)
Calcium: 9.9 mg/dL (ref 8.4–10.5)
Creatinine, Ser: 0.86 mg/dL (ref 0.50–1.10)
GFR calc Af Amer: 76 mL/min — ABNORMAL LOW (ref >90)
GFR calc non Af Amer: 66 mL/min — ABNORMAL LOW (ref >90)

## 2013-06-10 LAB — MAGNESIUM: Magnesium: 2.2 mg/dL (ref 1.5–2.5)

## 2013-06-10 LAB — CBC WITH DIFFERENTIAL/PLATELET
Basophils Absolute: 0 10*3/uL (ref 0.0–0.1)
Basophils Relative: 1 % (ref 0–1)
Eosinophils Absolute: 0.1 10*3/uL (ref 0.0–0.7)
MCH: 29 pg (ref 26.0–34.0)
MCHC: 33.6 g/dL (ref 30.0–36.0)
Monocytes Relative: 8 % (ref 3–12)
Neutrophils Relative %: 55 % (ref 43–77)
Platelets: 185 10*3/uL (ref 150–400)
RDW: 14 % (ref 11.5–15.5)

## 2013-06-10 LAB — PROTIME-INR
INR: 0.98 (ref 0.00–1.49)
Prothrombin Time: 12.8 seconds (ref 11.6–15.2)

## 2013-06-10 MED ORDER — ONE-DAILY MULTI VITAMINS PO TABS
1.0000 | ORAL_TABLET | Freq: Every day | ORAL | Status: DC
Start: 2013-06-10 — End: 2013-06-10

## 2013-06-10 MED ORDER — MAGNESIUM OXIDE 400 (241.3 MG) MG PO TABS
200.0000 mg | ORAL_TABLET | Freq: Every day | ORAL | Status: DC
  Administered 2013-06-12: 10:00:00 200 mg via ORAL
  Filled 2013-06-10 (×2): qty 0.5

## 2013-06-10 MED ORDER — ENOXAPARIN SODIUM 40 MG/0.4ML ~~LOC~~ SOLN
40.0000 mg | SUBCUTANEOUS | Status: DC
  Administered 2013-06-10: 40 mg via SUBCUTANEOUS
  Filled 2013-06-10 (×2): qty 0.4

## 2013-06-10 MED ORDER — SODIUM CHLORIDE 0.9 % IJ SOLN: 3.0000 mL | INTRAMUSCULAR | Status: DC | PRN

## 2013-06-10 MED ORDER — VITAMIN D3 25 MCG (1000 UNIT) PO TABS
1000.0000 [IU] | ORAL_TABLET | Freq: Every day | ORAL | Status: DC
  Administered 2013-06-12: 1000 [IU] via ORAL
  Filled 2013-06-10 (×2): qty 1

## 2013-06-10 MED ORDER — ACETAMINOPHEN 325 MG PO TABS: 650.0000 mg | ORAL_TABLET | ORAL | Status: DC | PRN

## 2013-06-10 MED ORDER — ADULT MULTIVITAMIN W/MINERALS CH
1.0000 | ORAL_TABLET | Freq: Every day | ORAL | Status: DC
  Administered 2013-06-12: 10:00:00 1 via ORAL
  Filled 2013-06-10 (×2): qty 1

## 2013-06-10 MED ORDER — FOLIC ACID 1 MG PO TABS
1.0000 mg | ORAL_TABLET | Freq: Every day | ORAL | Status: DC
  Administered 2013-06-10 – 2013-06-12 (×2): 1 mg via ORAL
  Filled 2013-06-10 (×3): qty 1

## 2013-06-10 MED ORDER — ZOLPIDEM TARTRATE 5 MG PO TABS: 5.0000 mg | ORAL_TABLET | Freq: Every evening | ORAL | Status: DC | PRN

## 2013-06-10 MED ORDER — ASPIRIN 81 MG PO CHEW
324.0000 mg | CHEWABLE_TABLET | ORAL | Status: AC
  Administered 2013-06-11: 324 mg via ORAL
  Filled 2013-06-10: qty 4

## 2013-06-10 MED ORDER — NITROGLYCERIN 0.4 MG SL SUBL: 0.4000 mg | SUBLINGUAL_TABLET | SUBLINGUAL | Status: DC | PRN

## 2013-06-10 MED ORDER — ONDANSETRON HCL 4 MG/2ML IJ SOLN: 4.0000 mg | Freq: Four times a day (QID) | INTRAMUSCULAR | Status: DC | PRN

## 2013-06-10 MED ORDER — CITALOPRAM HYDROBROMIDE 10 MG PO TABS
10.0000 mg | ORAL_TABLET | Freq: Every day | ORAL | Status: DC
  Administered 2013-06-10 – 2013-06-12 (×3): 10 mg via ORAL
  Filled 2013-06-10 (×3): qty 1

## 2013-06-10 MED ORDER — ALPRAZOLAM 0.25 MG PO TABS: 0.2500 mg | ORAL_TABLET | Freq: Two times a day (BID) | ORAL | Status: DC | PRN

## 2013-06-10 MED ORDER — MAGNESIUM 250 MG PO TABS: 250.0000 mg | ORAL_TABLET | Freq: Every day | ORAL | Status: DC

## 2013-06-10 MED ORDER — ALPRAZOLAM 0.25 MG PO TABS
0.5000 mg | ORAL_TABLET | Freq: Every evening | ORAL | Status: DC | PRN
  Administered 2013-06-10 – 2013-06-11 (×2): 0.5 mg via ORAL
  Filled 2013-06-10: qty 2
  Filled 2013-06-10: qty 1

## 2013-06-10 MED ORDER — SODIUM CHLORIDE 0.9 % IV SOLN: 250.0000 mL | INTRAVENOUS | Status: DC | PRN

## 2013-06-10 MED ORDER — VITAMIN C 500 MG PO TABS
500.0000 mg | ORAL_TABLET | Freq: Every day | ORAL | Status: DC
  Administered 2013-06-12: 10:00:00 500 mg via ORAL
  Filled 2013-06-10 (×2): qty 1

## 2013-06-10 MED ORDER — OMEGA-3-ACID ETHYL ESTERS 1 G PO CAPS
1.0000 g | ORAL_CAPSULE | Freq: Every day | ORAL | Status: DC
  Administered 2013-06-12: 1 g via ORAL
  Filled 2013-06-10 (×2): qty 1

## 2013-06-10 MED ORDER — SODIUM CHLORIDE 0.9 % IJ SOLN
3.0000 mL | Freq: Two times a day (BID) | INTRAMUSCULAR | Status: DC
  Administered 2013-06-10: 3 mL via INTRAVENOUS

## 2013-06-10 MED ORDER — ASPIRIN 81 MG PO CHEW
324.0000 mg | CHEWABLE_TABLET | Freq: Once | ORAL | Status: AC
  Administered 2013-06-10: 324 mg via ORAL
  Filled 2013-06-10: qty 4

## 2013-06-10 MED ORDER — BIOTIN 5 MG PO CAPS: 5.0000 mg | ORAL_CAPSULE | Freq: Every day | ORAL | Status: DC

## 2013-06-10 MED ORDER — CALCIUM CARBONATE-VITAMIN D 500-200 MG-UNIT PO TABS
1.0000 | ORAL_TABLET | Freq: Every day | ORAL | Status: DC
  Administered 2013-06-12: 1 via ORAL
  Filled 2013-06-10 (×2): qty 1

## 2013-06-10 NOTE — ED Notes (Signed)
Cardiology MD at bedside.

## 2013-06-10 NOTE — ED Provider Notes (Signed)
I saw and evaluated the patient, reviewed the resident's note and I agree with the findings and plan.  ECG shows third degree heart block, rate of 36, LBBB, LVH and repol abn.  Pt with fatigue, weakness, worse with exertion for past 6 days, seen by PCP today.  Pt is in no distress, had a HA earlier, resolved now.  No CP, back pain.  No syncope, but has felt lightheaded, likely cardiac in etiology.  Will need cardiac evaluation, admission, may require pacemaker.  Pt is protecting airway, no distress, no focal deficits, lungs clear.      CRITICAL CARE Performed by: Lear Ng Total critical care time: 30 min Critical care time was exclusive of separately billable procedures and treating other patients. Critical care was necessary to treat or prevent imminent or life-threatening deterioration. Critical care was time spent personally by me on the following activities: development of treatment plan with patient and/or surrogate as well as nursing, discussions with consultants, evaluation of patient's response to treatment, examination of patient, obtaining history from patient or surrogate, ordering and performing treatments and interventions, ordering and review of laboratory studies, ordering and review of radiographic studies, pulse oximetry and re-evaluation of patient's condition.   Impression: Heart block, 3rd degree Elevated troponin HTN   Gavin Pound. Treyson Axel, MD 06/10/13 1616

## 2013-06-10 NOTE — H&P (Signed)
History and Physical   Patient ID: Alexandra Wheeler MRN: [id number], DOB/AGE: May 05, 1941 72 y.o. Date of Encounter: 06/10/2013  Primary Physician: Nadean Corwin, MD Primary Cardiologist: DM  Chief Complaint:   Weakness  HPI: Alexandra Wheeler is a 72 y.o. female with no history of CAD. Starting last Wednesday, she noticed increased DOE and weakness. If she was still, no problems. If she did anything, she became SOB and was extremely weak. She limited her activities but did not improve. She had no chest pain and no palpitations. She did not have true presyncope but was lightheaded at times. She waited for her physical with Dr. Oneta Rack and when he checked her, he sent her immediately to the ER. In the ER, she is in CHB with a ventricular escape rhythm in the 30s. Lying still, she is comfortable and not SOB.   Past Medical History  Diagnosis Date  . Osteoporosis   . DI (detrusor instability)   . Atrophic vaginitis   . Anxiety   . Colon polyps   . H/O echocardiogram     a. Echo 11/2012:EF 50-55%, focal basal septal hypertrophy with some mitral valve SAM (mild) and MR.  This may be a sigmoid septum with advanced age or could be a variant of hypertrophic cardiomyopathy.  Marland Kitchen LBBB (left bundle branch block)   . Depression   . HTN (hypertension)   . GERD (gastroesophageal reflux disease)   . Hyperlipidemia   . Chest pain     a. Adenosine sestamibi (1/14) with no evidence of ischemia or infarction, EF 63%.     Surgical History:  Past Surgical History  Procedure Laterality Date  . Breast surgery      Breast cyst  . Tubal ligation    . Hemorrhoid surgery       I have reviewed the patient's current medications. Prior to Admission medications   Medication Sig Start Date End Date Taking? Authorizing Provider  ALPRAZolam Prudy Feeler) 0.5 MG tablet Take 0.5 mg by mouth at bedtime as needed for anxiety.    Yes Historical Provider, MD  Ascorbic Acid (VITAMIN C PO) Take 1 tablet by mouth  daily.    Yes Historical Provider, MD  BIOTIN PO Take 1 tablet by mouth daily.    Yes Historical Provider, MD  Calcium Carbonate-Vitamin D (CALCIUM + D PO) Take 1 tablet by mouth daily.    Yes Historical Provider, MD  Cholecalciferol (VITAMIN D PO) Take 1 tablet by mouth daily.    Yes Historical Provider, MD  citalopram (CELEXA) 10 MG tablet Take 10 mg by mouth daily.   Yes Historical Provider, MD  FOLIC ACID PO Take 1 tablet by mouth daily.    Yes Historical Provider, MD  MAGNESIUM PO Take 1 tablet by mouth daily.    Yes Historical Provider, MD  Multiple Vitamin (MULTIVITAMIN) tablet Take 1 tablet by mouth daily.   Yes Historical Provider, MD  Omega-3 Fatty Acids (FISH OIL PO) Take 1 capsule by mouth daily.    Yes Historical Provider, MD  Red Yeast Rice Extract (RED YEAST RICE PO) Take 1 tablet by mouth daily.    Yes Historical Provider, MD   Scheduled Meds: Continuous Infusions: PRN Meds:.    Allergies: No Known Allergies  History   Social History  . Marital Status: Widowed    Spouse Name: N/A    Number of Children: N/A  . Years of Education: N/A   Occupational History  . Part-time at [employer]  History Main Topics  . Smoking status: Former Games developer  . Smokeless tobacco: Never Used  . Alcohol Use: No  . Drug Use: No  . Sexually Active: No   Other Topics Concern  . Not on file   Social History Narrative   Lives alone. Dtr in the area.    Family History  Problem Relation Age of Onset  . Diabetes Mother   . Heart disease Mother   . Diabetes Brother   . Hypertension Brother   . Heart disease Brother   . Breast cancer Sister 8  . Aneurysm Brother    Family Status  Relation Status Death Age  . Brother Alive     Review of Systems:   Full 14-point review of systems otherwise negative except as noted above.  Physical Exam: Blood pressure 112/91, pulse 37, temperature 97.9 F (36.6 C), temperature source Oral, resp. rate 11, SpO2 100.00%. General: Well  developed, well nourished,female in no acute distress. Head: Normocephalic, atraumatic, sclera non-icteric, no xanthomas, nares are without discharge. Dentition: good Neck: No carotid bruits. JVD not elevated. No thyromegally Lungs: Good expansion bilaterally. without wheezes or rhonchi.  Heart: Regular rate and rhythm with S1 S2.  No S3 or S4.  2/6 murmur, no rubs, or gallops appreciated. Abdomen: Soft, non-tender, non-distended with normoactive bowel sounds. No hepatomegaly. No rebound/guarding. No obvious abdominal masses. Msk:  Strength and tone appear normal for age. No joint deformities or effusions, no spine or costo-vertebral angle tenderness. Extremities: No clubbing or cyanosis. No edema.  Distal pedal pulses are 2+ in 4 extrem Neuro: Alert and oriented X 3. Moves all extremities spontaneously. No focal deficits noted. Psych:  Responds to questions appropriately with a normal affect. Skin: No rashes or lesions noted  Labs:   Lab Results  Component Value Date   WBC 6.2 06/10/2013   HGB 14.1 06/10/2013   HCT 42.0 06/10/2013   MCV 86.2 06/10/2013   PLT 185 06/10/2013    Recent Labs  06/10/13 1430  INR 0.98     Recent Labs Lab 06/10/13 1430  NA 143  K 3.6  CL 105  CO2 27  BUN 12  CREATININE 0.86  CALCIUM 9.9  PROT 6.9  BILITOT 0.4  ALKPHOS 88  ALT 161*  AST 52*  GLUCOSE 108*    Recent Labs  06/10/13 1431  TROPONINI 0.66*    Radiology/Studies: Dg Chest Portable 1 View 06/10/2013   *RADIOLOGY REPORT*  Clinical Data: Bradycardia  PORTABLE CHEST - 1 VIEW  Comparison: 05/22/2008, chest MRI 01/10/2003  Findings: Lobulated masses at the lung apices are larger, likely corresponding to the thoracic meningoceles as seen on prior dedicated imaging. Pacing pads overlie the chest.  Mild moderate enlargement of the cardiomediastinal silhouette is slightly more prominent which may be in part due to technique.  The lungs are clear.  No pleural effusion.  No new osseous abnormality.   IMPRESSION: No acute cardiopulmonary process allowing for technique.   Original Report Authenticated By: Christiana Pellant, M.D.     Cardiac Cath:  Echo: 11/27/2012 Study Conclusions - Left ventricle: The cavity size was normal. There was mild focal basal hypertrophy of the septum. Systolic function was normal. The estimated ejection fraction was in the range of 50% to 55%. Wall motion was normal; there were no regional wall motion abnormalities. Doppler parameters are consistent with abnormal left ventricular relaxation (grade 1 diastolic dysfunction). - Aortic valve: Trivial regurgitation. Mean gradient: 9mm Hg (S). Peak gradient: 15mm Hg (S). - Mitral  valve: There was mild systolic anterior motion. Mild regurgitation. Impressions: - Proximal septal thickening and mild systolic anterior motion of the mitral valve; no significant gradient at rest.  ECG: 10-Jun-2013 13:46:49   CHB Right bundle branch block Left anterior fascicular block ** Bifascicular block ** Left ventricular hypertrophy with repolarization abnormality Vent. rate 36 BPM PR interval 664 ms QRS duration 166 ms QT/QTc 528/408 ms P-R-T axes 65 -67 92  ASSESSMENT AND PLAN:  Principal Problem:   CHB (complete heart block) Active Problems:   LBBB (left bundle branch block)   Elevated troponin   Melida Quitter, PA-C 06/10/2013 5:15 PM Beeper [phone]  See my HP note   Charlton Haws

## 2013-06-10 NOTE — H&P (Signed)
Physician History and Physical  Patient ID: Alexandra Wheeler MRN: [id number] DOB/AGE: Jun 14, 1941 72 y.o. Admit date: 06/10/2013  Primary Care Physician: Alexandra Corwin, MD Primary Cardiologist:  Alexandra Wheeler  Active Problems:   * No active hospital problems. *   HPI:   72 yo last seen by DM 12/25/12  Had LBBB, murmur and atypical chest pain.  Echo showed normal EF with small LVOT gradient.  Adenosine myovue was normal.  She was supposed to start lopressor For the presumed LVOT gradient but never did.  She has had a week of presyncope dizzyness and malaise.  Denies chest pain.  Found to be in CHB in ER rate 40 with stable BP.  Troponin .66 TSH has been normal and renal function and K normal.  Because of mildly elevated troponin and elevated LFT;s concern for subacute MI  Bedside echo showed normal EF with mild distal septal hypokinesis but no evidence of large anterior MI.  There was also no effusion.  Patient indicates she has only been taking vitamins.  Mild dyspnea    Review of systems complete and found to be negative unless listed above   Past Medical History  Diagnosis Date  . Osteoporosis   . DI (detrusor instability)   . Atrophic vaginitis   . Anxiety   . Colon polyps   . H/O echocardiogram     a. Echo 11/2012:EF 50-55%, focal basal septal hypertrophy with some mitral valve SAM (mild) and MR.  This may be a sigmoid septum with advanced age or could be a variant of hypertrophic cardiomyopathy.  Marland Kitchen LBBB (left bundle branch block)   . Depression   . HTN (hypertension)   . GERD (gastroesophageal reflux disease)   . Hyperlipidemia   . Chest pain     a. Adenosine sestamibi (1/14) with no evidence of ischemia or infarction, EF 63%.     Family History  Problem Relation Age of Onset  . Diabetes Mother   . Heart disease Mother   . Diabetes Brother   . Hypertension Brother   . Heart disease Brother   . Breast cancer Sister 18  . Aneurysm Brother     History   Social History  .  Marital Status: Widowed    Spouse Name: N/A    Number of Children: N/A  . Years of Education: N/A   Occupational History  . Part-time at [employer]    Social History Main Topics  . Smoking status: Former Games developer  . Smokeless tobacco: Never Used  . Alcohol Use: No  . Drug Use: No  . Sexually Active: No   Other Topics Concern  . Not on file   Social History Narrative   Lives alone. Dtr in the area.    Past Surgical History  Procedure Laterality Date  . Breast surgery      Breast cyst  . Tubal ligation    . Hemorrhoid surgery        (Not in a hospital admission)  Physical Exam: Blood pressure 112/91, pulse 37, temperature 97.9 F (36.6 C), temperature source Oral, resp. rate 11, SpO2 100.00%.    Affect appropriate Healthy:  appears younger than stated age HEENT: normal Neck supple with no adenopathy JVP normal no bruits no thyromegaly Cannon A waves  Lungs clear with no wheezing and good diaphragmatic motion Heart:  S1/S2 systolic murmur, no rub, gallop or click PMI normal Abdomen: benighn, BS positve, no tenderness, no AAA no bruit.  No HSM or HJR Distal pulses intact with  no bruits No edema Neuro non-focal Skin warm and dry No muscular weakness   Labs:   Lab Results  Component Value Date   WBC 6.2 06/10/2013   HGB 14.1 06/10/2013   HCT 42.0 06/10/2013   MCV 86.2 06/10/2013   PLT 185 06/10/2013    Recent Labs Lab 06/10/13 1430  NA 143  K 3.6  CL 105  CO2 27  BUN 12  CREATININE 0.86  CALCIUM 9.9  PROT 6.9  BILITOT 0.4  ALKPHOS 88  ALT 161*  AST 52*  GLUCOSE 108*   Lab Results  Component Value Date   TROPONINI 0.66* 06/10/2013       Radiology: Dg Chest Portable 1 View  06/10/2013   *RADIOLOGY REPORT*  Clinical Data: Bradycardia  PORTABLE CHEST - 1 VIEW  Comparison: 05/22/2008, chest MRI 01/10/2003  Findings: Lobulated masses at the lung apices are larger, likely corresponding to the thoracic meningoceles as seen on prior dedicated imaging. Pacing pads  overlie the chest.  Mild moderate enlargement of the cardiomediastinal silhouette is slightly more prominent which may be in part due to technique.  The lungs are clear.  No pleural effusion.  No new osseous abnormality.  IMPRESSION: No acute cardiopulmonary process allowing for technique.   Original Report Authenticated By: Christiana Pellant, M.D.    EKG: 11/15/12  SR rate 54 LBBB  In ER she was not externally pacing when I saw her but ECG with either CHB vs isorhythmic AV disocation and RBBB LAFB pattern.    ASSESSMENT AND PLAN:   Heart Block:  Symptomatic with no obvious reversable cause in setting of chronic LBBB.  Fortunately she indicates not taking lopressor.  She had a negative adenosine myovue 11/15/12 and is not having chest pain.  Echo with no discrete RWMAls  However mildly elevated troponin and elevated LFTs.  Discussed need for cath and PPM with patient and daughter Alexandra Wheeler.  She is asymptomatic laying in bed with good BP and I dont think temporary wire needed tonight.  Placing it now would risk malfunction overnight and asystole.  Discussed with Dr Johney Frame.  Admit to CCU follow enzymes.  Atropine and epi at bedside.  Keep external pads on.  Cath with temp wire and then PPM tomorrow.  Patient and daughter understand issues and seriousness of situation and need for monitoring in CCU HTN:  No Rx [place] in heart block.  Allow vasocontriction Elevated LFTls  Echo shows normal LV function Differential includes subacute MI and passive congestion but neither quite fit. Will foloow and see if it resolves post pacing  Signed: Theron Arista Nishan8/02/2013, 5:03 PM

## 2013-06-10 NOTE — Progress Notes (Signed)
  Echocardiogram 2D Echocardiogram limited has been performed.  Alexandra Wheeler FRANCES 06/10/2013, 5:13 PM

## 2013-06-10 NOTE — ED Notes (Addendum)
Pt c/o generalized weakness, intermittent dizziness, lightheadedness & has noticed SOB with exertion x 1 week. Denies CP. Sent here from MD's office due to low HR.  Pt noted to be in 3rd degree HB, placed on Zoll.  Pt A&OX4, elevated BP presently, no history of HTN

## 2013-06-10 NOTE — ED Notes (Signed)
Went to her MDs today for her physical.  Found Sinus bradycardia,  A_V block pt. Is dizzy,. And weak,   Skin is is p/w/d  Denies any chest pain.

## 2013-06-10 NOTE — Telephone Encounter (Signed)
Received call/ DOD call, pt is in complete heart block/ pt to go to [facility] ED, pt will arrive by family member, Mardella Layman was notified of pts soon arrival.

## 2013-06-10 NOTE — Progress Notes (Signed)
PHARMACIST - PHYSICIAN ORDER COMMUNICATION  CONCERNING: P&T Medication Policy on Herbal Medications  DESCRIPTION:  This patient's order for:  biotin  has been noted.  This product(s) is classified as an "herbal" or natural product. Due to a lack of definitive safety studies or FDA approval, nonstandard manufacturing practices, plus the potential risk of unknown drug-drug interactions while on inpatient medications, the Pharmacy and Therapeutics Committee does not permit the use of "herbal" or natural products of this type within [facility].   ACTION TAKEN: Please reevaluate patient's clinical condition at discharge and address if the herbal or natural product(s) should be resumed at that time.   Margie Billet, PharmD Clinical Pharmacist - Resident Pager: [phone] Pharmacy: [phone] 06/10/2013 7:02 PM

## 2013-06-10 NOTE — ED Provider Notes (Signed)
CSN: [id number]     Arrival date & time 06/10/13  1258 History      Chief Complaint  Patient presents with  . Bradycardia   (Consider location/radiation/quality/duration/timing/severity/associated sxs/prior Treatment) HPI  72 year old female with history of LBBB, GERD, hypertension per record (patient denies), chest pain with recent negative stress test in 11/2012 presents from PCP office for lightheadedness, generalized weakness and bradycardia found on EKG.  Patient reports symptoms began last Tuesday.  Reports she has felt fatigued, with especially easy fatigue and dyspnea on exertion.  Symptoms are severe.  Denies chest pain, dyspnea at rest, syncope.  Notes lightheadedness worse with exertion.  Past Medical History  Diagnosis Date  . Osteoporosis   . DI (detrusor instability)   . Atrophic vaginitis   . Anxiety   . Colon polyps    Past Surgical History  Procedure Laterality Date  . Breast surgery      Breast cyst  . Tubal ligation    . Hemorrhoid surgery     Family History  Problem Relation Age of Onset  . Diabetes Mother   . Heart disease Mother   . Diabetes Brother   . Hypertension Brother   . Heart disease Brother   . Breast cancer Sister 48  . Aneurysm Brother    History  Substance Use Topics  . Smoking status: Former Games developer  . Smokeless tobacco: Never Used  . Alcohol Use: No   OB History   Grav Para Term Preterm Abortions TAB SAB Ect Mult Living   2 2 2       1      Review of Systems  Constitutional: Positive for fatigue. Negative for fever.  HENT: Negative for sore throat and neck pain.   Eyes: Negative for visual disturbance.  Respiratory: Positive for shortness of breath (on exertion). Negative for cough.   Cardiovascular: Negative for chest pain.  Gastrointestinal: Negative for nausea, vomiting, abdominal pain and diarrhea.  Genitourinary: Negative for difficulty urinating.  Musculoskeletal: Negative for back pain.  Skin: Negative for rash.   Neurological: Positive for light-headedness. Negative for syncope and headaches (this AM, now resolved).    Allergies  Review of patient's allergies indicates no known allergies.  Home Medications   Current Outpatient Rx  Name  Route  Sig  Dispense  Refill  . ALPRAZolam (XANAX) 0.5 MG tablet   Oral   Take 0.5 mg by mouth at bedtime as needed.         . Ascorbic Acid (VITAMIN C PO)   Oral   Take by mouth daily.         Marland Kitchen BIOTIN PO   Oral   Take by mouth daily.         . Calcium Carbonate-Vitamin D (CALCIUM + D PO)   Oral   Take by mouth.         . Cholecalciferol (VITAMIN D PO)   Oral   Take by mouth.         . citalopram (CELEXA) 10 MG tablet   Oral   Take 10 mg by mouth daily.         Marland Kitchen FOLIC ACID PO   Oral   Take by mouth daily.         Marland Kitchen MAGNESIUM PO   Oral   Take by mouth.         . metoprolol tartrate (LOPRESSOR) 25 MG tablet      1/2 tablet (total 12.5mg ) two times a day  90 tablet   3   . Multiple Vitamin (MULTIVITAMIN) tablet   Oral   Take 1 tablet by mouth daily.         . Omega-3 Fatty Acids (FISH OIL PO)   Oral   Take by mouth.         . Red Yeast Rice Extract (RED YEAST RICE PO)   Oral   Take by mouth.          BP 195/76  Pulse 36  Temp(Src) 97.9 F (36.6 C) (Oral)  Resp 15  SpO2 99% Physical Exam  Nursing note and vitals reviewed. Constitutional: She is oriented to person, place, and time. She appears well-developed and well-nourished. No distress.  HENT:  Head: Normocephalic and atraumatic.  Eyes: Conjunctivae and EOM are normal.  Neck: Normal range of motion.  Cardiovascular: Regular rhythm, normal heart sounds and intact distal pulses.  Bradycardia present.  Exam reveals no gallop and no friction rub.   No murmur heard. Pulmonary/Chest: Effort normal and breath sounds normal. No respiratory distress. She has no wheezes. She has no rales.  Abdominal: Soft. She exhibits no distension. There is no  tenderness. There is no guarding.  Musculoskeletal: She exhibits edema (trace). She exhibits no tenderness.  Neurological: She is alert and oriented to person, place, and time.  Skin: Skin is warm and dry. No rash noted. She is not diaphoretic. No erythema.    ED Course   Procedures (including critical care time)  Labs Reviewed - No data to display No results found. No diagnosis found.  MDM  72 year old female with history of LBBB, GERD, hypertension per record (patient denies), chest pain with recent negative stress test in 11/2012 presents from PCP office for lightheadedness, generalized weakness and bradycardia found on EKG.  EKG on arrival consistent with complete heart block.  Patient well appearing with normal mentation, hypertensive blood pressures, HR of 36.  Denies acute chest pain and describes subacute presentation with fatigue and exertional fatigue and shortness of breath not consistent with ACS however patient with elevated troponin of .66.  Given 324mg  ASA and cardiology consulted for concern of complete heart block and elevated troponin.  Patient remained stable throughout ED stay and is awaiting bed and Cardiology evaluation.   Rhae Lerner, MD 06/10/13 [phone]

## 2013-06-10 NOTE — ED Notes (Signed)
Ronny Flurry, PA with [facility] at bedside.  Bedside ECHO complete.

## 2013-06-11 ENCOUNTER — Encounter (HOSPITAL_COMMUNITY)
Admission: EM | Disposition: A | Discharge status: Home / Self Care | Payer: Self-pay | Source: Home / Self Care | Attending: Cardiovascular Disease

## 2013-06-11 ENCOUNTER — Encounter (HOSPITAL_COMMUNITY): Payer: Self-pay | Admitting: Surgery

## 2013-06-11 DIAGNOSIS — I442 Atrioventricular block, complete: Secondary | ICD-10-CM

## 2013-06-11 DIAGNOSIS — R42 Dizziness and giddiness: Secondary | ICD-10-CM

## 2013-06-11 DIAGNOSIS — I214 Non-ST elevation (NSTEMI) myocardial infarction: Secondary | ICD-10-CM

## 2013-06-11 DIAGNOSIS — I447 Left bundle-branch block, unspecified: Secondary | ICD-10-CM

## 2013-06-11 DIAGNOSIS — I10 Essential (primary) hypertension: Secondary | ICD-10-CM

## 2013-06-11 HISTORY — PX: PERMANENT PACEMAKER INSERTION: SHX5480

## 2013-06-11 HISTORY — PX: PACEMAKER INSERTION: SHX728

## 2013-06-11 HISTORY — PX: TEMPORARY PACEMAKER INSERTION: SHX5471

## 2013-06-11 HISTORY — PX: LEFT HEART CATHETERIZATION WITH CORONARY ANGIOGRAM: SHX5451

## 2013-06-11 LAB — COMPREHENSIVE METABOLIC PANEL
ALT: 112 U/L — ABNORMAL HIGH (ref 0–35)
AST: 35 U/L (ref 0–37)
CO2: 24 mEq/L (ref 19–32)
Calcium: 9 mg/dL (ref 8.4–10.5)
Chloride: 107 mEq/L (ref 96–112)
Creatinine, Ser: 0.8 mg/dL (ref 0.50–1.10)
GFR calc Af Amer: 83 mL/min — ABNORMAL LOW (ref >90)
GFR calc non Af Amer: 72 mL/min — ABNORMAL LOW (ref >90)
Glucose, Bld: 90 mg/dL (ref 70–99)
Total Bilirubin: 0.4 mg/dL (ref 0.3–1.2)

## 2013-06-11 LAB — TROPONIN I: Troponin I: 1.06 ng/mL (ref <0.30)

## 2013-06-11 SURGERY — LEFT HEART CATHETERIZATION WITH CORONARY ANGIOGRAM
Anesthesia: LOCAL | Site: Shoulder

## 2013-06-11 SURGERY — PERMANENT PACEMAKER INSERTION
Anesthesia: LOCAL

## 2013-06-11 MED ORDER — ACETAMINOPHEN 325 MG PO TABS: 325.0000 mg | ORAL_TABLET | ORAL | Status: DC | PRN

## 2013-06-11 MED ORDER — NITROGLYCERIN 0.2 MG/ML ON CALL CATH LAB
INTRAVENOUS | Status: AC
  Filled 2013-06-11: qty 1

## 2013-06-11 MED ORDER — HYDROCODONE-ACETAMINOPHEN 5-325 MG PO TABS
1.0000 | ORAL_TABLET | ORAL | Status: DC | PRN
  Administered 2013-06-11 – 2013-06-12 (×3): 1 via ORAL
  Filled 2013-06-11 (×3): qty 1

## 2013-06-11 MED ORDER — FENTANYL CITRATE 0.05 MG/ML IJ SOLN
INTRAMUSCULAR | Status: AC
  Filled 2013-06-11: qty 2

## 2013-06-11 MED ORDER — MIDAZOLAM HCL 5 MG/5ML IJ SOLN
INTRAMUSCULAR | Status: AC
  Filled 2013-06-11: qty 5

## 2013-06-11 MED ORDER — ONDANSETRON HCL 4 MG/2ML IJ SOLN: 4.0000 mg | Freq: Four times a day (QID) | INTRAMUSCULAR | Status: DC | PRN

## 2013-06-11 MED ORDER — LIDOCAINE HCL (PF) 1 % IJ SOLN
INTRAMUSCULAR | Status: AC
  Filled 2013-06-11: qty 30

## 2013-06-11 MED ORDER — CEFAZOLIN SODIUM-DEXTROSE 2-3 GM-% IV SOLR
2.0000 g | INTRAVENOUS | Status: DC
  Filled 2013-06-11: qty 50

## 2013-06-11 MED ORDER — HEPARIN (PORCINE) IN NACL 2-0.9 UNIT/ML-% IJ SOLN
INTRAMUSCULAR | Status: AC
  Filled 2013-06-11: qty 500

## 2013-06-11 MED ORDER — HEPARIN (PORCINE) IN NACL 2-0.9 UNIT/ML-% IJ SOLN
INTRAMUSCULAR | Status: AC
  Filled 2013-06-11: qty 1000

## 2013-06-11 MED ORDER — CHLORHEXIDINE GLUCONATE 4 % EX LIQD: 60.0000 mL | Freq: Once | CUTANEOUS | Status: DC

## 2013-06-11 MED ORDER — MIDAZOLAM HCL 2 MG/2ML IJ SOLN
INTRAMUSCULAR | Status: AC
  Filled 2013-06-11: qty 2

## 2013-06-11 MED ORDER — CEFAZOLIN SODIUM 1-5 GM-% IV SOLN
1.0000 g | Freq: Four times a day (QID) | INTRAVENOUS | Status: AC
  Administered 2013-06-11 – 2013-06-12 (×3): 1 g via INTRAVENOUS
  Filled 2013-06-11 (×3): qty 50

## 2013-06-11 MED ORDER — SODIUM CHLORIDE 0.9 % IV SOLN: INTRAVENOUS | Status: DC

## 2013-06-11 MED ORDER — LIDOCAINE HCL (PF) 1 % IJ SOLN
INTRAMUSCULAR | Status: AC
  Filled 2013-06-11: qty 60

## 2013-06-11 MED ORDER — YOU HAVE A PACEMAKER BOOK
Freq: Once | Status: AC
  Administered 2013-06-11: 21:00:00
  Filled 2013-06-11: qty 1

## 2013-06-11 MED ORDER — SODIUM CHLORIDE 0.9 % IV SOLN: INTRAVENOUS | Status: AC

## 2013-06-11 MED ORDER — SODIUM CHLORIDE 0.9 % IR SOLN
80.0000 mg | Status: DC
  Filled 2013-06-11: qty 2

## 2013-06-11 NOTE — Care Management Note (Signed)
    Page 1 of 1   06/11/2013     11:05:42 AM   CARE MANAGEMENT NOTE 06/11/2013  Patient:  Alexandra Wheeler, Alexandra Wheeler   Account Number:  [id number]  Date Initiated:  06/11/2013  Documentation initiated by:  Junius Creamer  Subjective/Objective Assessment:   adm w complete heart block, temp pacer     Action/Plan:   lives w fam, pcp dr Nelva Nay [place]   Anticipated DC Date:     Anticipated DC Plan:        DC Planning Services  CM consult      Choice offered to / List presented to:             Status of service:   Medicare Important Message given?   (If response is "NO", the following Medicare IM given date fields will be blank) Date Medicare IM given:   Date Additional Medicare IM given:    Discharge Disposition:  HOME/SELF CARE  Per UR Regulation:  Reviewed for med. necessity/level of care/duration of stay  If discussed at Long Length of Stay Meetings, dates discussed:    Comments:

## 2013-06-11 NOTE — Interval H&P Note (Signed)
History and Physical Interval Note:  06/11/2013 9:02 AM  Alexandra Wheeler  has presented today for cardiac cath with with the diagnosis of NSTEMI. The various methods of treatment have been discussed with the patient and family. After consideration of risks, benefits and other options for treatment, the patient has consented to  Procedure(s): LEFT HEART CATHETERIZATION WITH CORONARY ANGIOGRAM (N/A) TEMPORARY PACEMAKER INSERTION (N/A) as a surgical intervention .  The patient's history has been reviewed, patient examined, no change in status, stable for surgery.  I have reviewed the patient's chart and labs.  Questions were answered to the patient's satisfaction.    Cath Lab Visit (complete for each Cath Lab visit)  Clinical Evaluation Leading to the Procedure:   ACS: yes  Non-ACS:    Anginal Classification: No Symptoms  Anti-ischemic medical therapy: No Therapy  Non-Invasive Test Results: No non-invasive testing performed  Prior CABG: No previous CABG        [name]

## 2013-06-11 NOTE — Consult Note (Signed)
ELECTROPHYSIOLOGY CONSULT NOTE    Patient ID: Alexandra Wheeler MRN: [id number], DOB/AGE: 04/23/1941 72 y.o.  Admit date: 06/10/2013 Date of Consult: 06-11-2013  Primary Physician: Nadean Corwin, MD Primary Cardiologist: Marca Ancona, MD  Reason for Consultation: complete heart block  HPI:  Alexandra Wheeler is a 72 year old female with a past medical history significant for hypertension, GERD, hyperlipidemia, atypical chest pain and LBBB.  Recent echo showed normal EF with small LVOT gradient.  She had an adenosine myoview which was normal.  She was prescribed Lopressor but never started this medication.  For the last week, she has been having presyncope, dizziness, and malaise.  She went to her PCP for a physical and was found to be in heart block and was referred to [facility]  She presented to the ER and was found to be in CHB with RBBB and LAFB.  Bedside echo demonstrated normal EF with mild distal septal hypokinesis.  Troponin was elevated at 0.66 on admission (last 2.07).  TSH and electrolytes were normal.  Plan is for cardiac catheterization today for further evaluation.   She was on no AV nodal blocking agents at home.  She denies problems with chest pain, palpitations, or pre-syncope.  She does report that she has had dyspnea on exertion for the past week.  She has had no fevers or chills.  She is retired from [employer].  She is widowed but has family support with her daughter close by.   ROS is negative except as outlined above.   Past Medical History  Diagnosis Date  . Osteoporosis   . DI (detrusor instability)   . Atrophic vaginitis   . Anxiety   . Colon polyps   . H/O echocardiogram     a. Echo 11/2012:EF 50-55%, focal basal septal hypertrophy with some mitral valve SAM (mild) and MR.  This may be a sigmoid septum with advanced age or could be a variant of hypertrophic cardiomyopathy.  Marland Kitchen LBBB (left bundle branch block)   . Depression   . HTN (hypertension)    . GERD (gastroesophageal reflux disease)   . Hyperlipidemia   . Chest pain     a. Adenosine sestamibi (1/14) with no evidence of ischemia or infarction, EF 63%.      Surgical History:  Past Surgical History  Procedure Laterality Date  . Breast surgery      Breast cyst  . Tubal ligation    . Hemorrhoid surgery       Prescriptions prior to admission  Medication Sig Dispense Refill  . ALPRAZolam (XANAX) 0.5 MG tablet Take 0.5 mg by mouth at bedtime as needed for anxiety.       . Ascorbic Acid (VITAMIN C PO) Take 1 tablet by mouth Alexandra.       Marland Kitchen BIOTIN PO Take 1 tablet by mouth Alexandra.       . Calcium Carbonate-Vitamin D (CALCIUM + D PO) Take 1 tablet by mouth Alexandra.       . Cholecalciferol (VITAMIN D PO) Take 1 tablet by mouth Alexandra.       . citalopram (CELEXA) 10 MG tablet Take 10 mg by mouth Alexandra.      Marland Kitchen FOLIC ACID PO Take 1 tablet by mouth Alexandra.       Marland Kitchen MAGNESIUM PO Take 1 tablet by mouth Alexandra.       . Multiple Vitamin (MULTIVITAMIN) tablet Take 1 tablet by mouth Alexandra.      . Omega-3 Fatty  Acids (FISH OIL PO) Take 1 capsule by mouth Alexandra.       . Red Yeast Rice Extract (RED YEAST RICE PO) Take 1 tablet by mouth Alexandra.         Inpatient Medications:  . calcium-vitamin D  1 tablet Oral Alexandra  . cholecalciferol  1,000 Units Oral Alexandra  . citalopram  10 mg Oral Alexandra  . enoxaparin (LOVENOX) injection  40 mg Subcutaneous Q24H  . folic acid  1 mg Oral Alexandra  . magnesium oxide  200 mg Oral Alexandra  . multivitamin with minerals  1 tablet Oral Alexandra  . omega-3 acid ethyl esters  1 g Oral Alexandra  . sodium chloride  3 mL Intravenous Q12H  . sodium chloride  3 mL Intravenous Q12H  . vitamin C  500 mg Oral Alexandra    Allergies: No Known Allergies  History   Social History  . Marital Status: Widowed    Spouse Name: N/A    Number of Children: N/A  . Years of Education: N/A   Occupational History  . Part-time at [employer]    Social History Main Topics  . Smoking status:  Former Games developer  . Smokeless tobacco: Never Used  . Alcohol Use: No  . Drug Use: No  . Sexually Active: No   Other Topics Concern  . Not on file   Social History Narrative   Lives alone. Dtr in the area.     Family History  Problem Relation Age of Onset  . Diabetes Mother   . Heart disease Mother   . Diabetes Brother   . Hypertension Brother   . Heart disease Brother   . Breast cancer Sister 70  . Aneurysm Brother     Physical Exam: Filed Vitals:   06/11/13 0400 06/11/13 0500 06/11/13 0600 06/11/13 0700  BP: 147/79 149/86 136/80 109/64  Pulse: 36 36 36 37  Temp:      TempSrc:      Resp: 17 13 10 18   Height:  5\' 5"  (1.651 m)    Weight:  152 lb 4.8 oz (69.083 kg)    SpO2: 94% 97% 96% 95%    GEN- The patient is well appearing, alert and oriented x 3 today.   Head- normocephalic, atraumatic Eyes-  Sclera clear, conjunctiva pink Ears- hearing intact Oropharynx- clear Neck- supple, no JVP Lymph- no cervical lymphadenopathy Lungs- Clear to ausculation bilaterally, normal work of breathing Heart- Regular rate and rhythm, no murmurs, rubs or gallops, PMI not laterally displaced GI- soft, NT, ND, + BS Extremities- no clubbing, cyanosis, or edema MS- no significant deformity or atrophy Skin- no rash or lesion Psych- euthymic mood, full affect Neuro- strength and sensation are intact  Labs:   Lab Results  Component Value Date   WBC 6.2 06/10/2013   HGB 14.1 06/10/2013   HCT 42.0 06/10/2013   MCV 86.2 06/10/2013   PLT 185 06/10/2013    Recent Labs Lab 06/10/13 1430  NA 143  K 3.6  CL 105  CO2 27  BUN 12  CREATININE 0.86  CALCIUM 9.9  PROT 6.9  BILITOT 0.4  ALKPHOS 88  ALT 161*  AST 52*  GLUCOSE 108*   Lab Results  Component Value Date   TROPONINI 2.07* 06/11/2013    Radiology/Studies: X-ray Chest Pa And Lateral 06/10/2013   *RADIOLOGY REPORT*  Clinical Data: Shortness of breath.  Heart palpitations.  CHEST - 2 VIEW  Comparison: Single view of the chest  06/10/2012 PA and lateral  chest 05/22/2008.  MRI chest 01/28/2004.  Findings: There is cardiomegaly but no edema.  Lungs are clear.  No pneumothorax or pleural effusion.  Biapical opacities are again seen and correspond to thoracic meningoceles seen on prior MRI.  IMPRESSION: Cardiomegaly without acute disease.   Original Report Authenticated By: Holley Dexter, M.D.   ZOX:[id number] heart block with RBBB and LAFB  TELEMETRY: complete heart block, v rates 30's  Assessment and Plan:  1. NSTEMI I think that her symptoms are due to AV block and that her elevated troponin is due to heart strain.  An acute thrombotic event seems less likely given her presentation.  I do however agree with Dr Eden Emms that cath is prudent at this point.  She will proceed with cath today.  We will defer decision regarding PPM until after her cath.  2. Complete heart block The patient has symptomatic complete heart block.  She has a known LBBB which is representative of chronic intrinsic conduction disease.  She is on no AV nodal agents.  We will proceed with cath as above and make a decision about pacing afterwards.  3. HTN Stable No change required today

## 2013-06-11 NOTE — H&P (View-Only) (Signed)
  ELECTROPHYSIOLOGY CONSULT NOTE    Patient ID: Alexandra Wheeler MRN: [id number], DOB/AGE: 12/22/1940 71 y.o.  Admit date: 06/10/2013 Date of Consult: 06-11-2013  Primary Physician: [name],[name], MD Primary Cardiologist: Dalton McLean, MD  Reason for Consultation: complete heart block  HPI:  Alexandra Wheeler is a 71 year old female with a past medical history significant for hypertension, GERD, hyperlipidemia, atypical chest pain and LBBB.  Recent echo showed normal EF with small LVOT gradient.  She had an adenosine myoview which was normal.  She was prescribed Lopressor but never started this medication.  For the last week, she has been having presyncope, dizziness, and malaise.  She went to her PCP for a physical and was found to be in heart block and was referred to [facility].  She presented to the ER and was found to be in CHB with RBBB and LAFB.  Bedside echo demonstrated normal EF with mild distal septal hypokinesis.  Troponin was elevated at 0.66 on admission (last 2.07).  TSH and electrolytes were normal.  Plan is for cardiac catheterization today for further evaluation.   She was on no AV nodal blocking agents at home.  She denies problems with chest pain, palpitations, or pre-syncope.  She does report that she has had dyspnea on exertion for the past week.  She has had no fevers or chills.  She is retired from [employer].  She is widowed but has family support with her daughter close by.   ROS is negative except as outlined above.   Past Medical History  Diagnosis Date  . Osteoporosis   . DI (detrusor instability)   . Atrophic vaginitis   . Anxiety   . Colon polyps   . H/O echocardiogram     a. Echo 11/2012:EF 50-55%, focal basal septal hypertrophy with some mitral valve SAM (mild) and MR.  This may be a sigmoid septum with advanced age or could be a variant of hypertrophic cardiomyopathy.  . LBBB (left bundle branch block)   . Depression   . HTN (hypertension)    . GERD (gastroesophageal reflux disease)   . Hyperlipidemia   . Chest pain     a. Adenosine sestamibi (1/14) with no evidence of ischemia or infarction, EF 63%.      Surgical History:  Past Surgical History  Procedure Laterality Date  . Breast surgery      Breast cyst  . Tubal ligation    . Hemorrhoid surgery       Prescriptions prior to admission  Medication Sig Dispense Refill  . ALPRAZolam (XANAX) 0.5 MG tablet Take 0.5 mg by mouth at bedtime as needed for anxiety.       . Ascorbic Acid (VITAMIN C PO) Take 1 tablet by mouth daily.       . BIOTIN PO Take 1 tablet by mouth daily.       . Calcium Carbonate-Vitamin D (CALCIUM + D PO) Take 1 tablet by mouth daily.       . Cholecalciferol (VITAMIN D PO) Take 1 tablet by mouth daily.       . citalopram (CELEXA) 10 MG tablet Take 10 mg by mouth daily.      . FOLIC ACID PO Take 1 tablet by mouth daily.       . MAGNESIUM PO Take 1 tablet by mouth daily.       . Multiple Vitamin (MULTIVITAMIN) tablet Take 1 tablet by mouth daily.      . Omega-3 Fatty   Acids (FISH OIL PO) Take 1 capsule by mouth daily.       . Red Yeast Rice Extract (RED YEAST RICE PO) Take 1 tablet by mouth daily.         Inpatient Medications:  . calcium-vitamin D  1 tablet Oral Daily  . cholecalciferol  1,000 Units Oral Daily  . citalopram  10 mg Oral Daily  . enoxaparin (LOVENOX) injection  40 mg Subcutaneous Q24H  . folic acid  1 mg Oral Daily  . magnesium oxide  200 mg Oral Daily  . multivitamin with minerals  1 tablet Oral Daily  . omega-3 acid ethyl esters  1 g Oral Daily  . sodium chloride  3 mL Intravenous Q12H  . sodium chloride  3 mL Intravenous Q12H  . vitamin C  500 mg Oral Daily    Allergies: No Known Allergies  History   Social History  . Marital Status: Widowed    Spouse Name: N/A    Number of Children: N/A  . Years of Education: N/A   Occupational History  . Part-time at [employer]    Social History Main Topics  . Smoking status:  Former Smoker  . Smokeless tobacco: Never Used  . Alcohol Use: No  . Drug Use: No  . Sexually Active: No   Other Topics Concern  . Not on file   Social History Narrative   Lives alone. Dtr in the area.     Family History  Problem Relation Age of Onset  . Diabetes Mother   . Heart disease Mother   . Diabetes Brother   . Hypertension Brother   . Heart disease Brother   . Breast cancer Sister 67  . Aneurysm Brother     Physical Exam: Filed Vitals:   06/11/13 0400 06/11/13 0500 06/11/13 0600 06/11/13 0700  BP: 147/79 149/86 136/80 109/64  Pulse: 36 36 36 37  Temp:      TempSrc:      Resp: 17 13 10 18  Height:  5' 5" (1.651 m)    Weight:  152 lb 4.8 oz (69.083 kg)    SpO2: 94% 97% 96% 95%    GEN- The patient is well appearing, alert and oriented x 3 today.   Head- normocephalic, atraumatic Eyes-  Sclera clear, conjunctiva pink Ears- hearing intact Oropharynx- clear Neck- supple, no JVP Lymph- no cervical lymphadenopathy Lungs- Clear to ausculation bilaterally, normal work of breathing Heart- Regular rate and rhythm, no murmurs, rubs or gallops, PMI not laterally displaced GI- soft, NT, ND, + BS Extremities- no clubbing, cyanosis, or edema MS- no significant deformity or atrophy Skin- no rash or lesion Psych- euthymic mood, full affect Neuro- strength and sensation are intact  Labs:   Lab Results  Component Value Date   WBC 6.2 06/10/2013   HGB 14.1 06/10/2013   HCT 42.0 06/10/2013   MCV 86.2 06/10/2013   PLT 185 06/10/2013    Recent Labs Lab 06/10/13 1430  NA 143  K 3.6  CL 105  CO2 27  BUN 12  CREATININE 0.86  CALCIUM 9.9  PROT 6.9  BILITOT 0.4  ALKPHOS 88  ALT 161*  AST 52*  GLUCOSE 108*   Lab Results  Component Value Date   TROPONINI 2.07* 06/11/2013    Radiology/Studies: X-ray Chest Pa And Lateral 06/10/2013   *RADIOLOGY REPORT*  Clinical Data: Shortness of breath.  Heart palpitations.  CHEST - 2 VIEW  Comparison: Single view of the chest  06/10/2012 PA and lateral   chest 05/22/2008.  MRI chest 01/28/2004.  Findings: There is cardiomegaly but no edema.  Lungs are clear.  No pneumothorax or pleural effusion.  Biapical opacities are again seen and correspond to thoracic meningoceles seen on prior MRI.  IMPRESSION: Cardiomegaly without acute disease.   Original Report Authenticated By: Thomas D'Alessio, M.D.   EKG:complete heart block with RBBB and LAFB  TELEMETRY: complete heart block, v rates 30's  Assessment and Plan:  1. NSTEMI I think that her symptoms are due to AV block and that her elevated troponin is due to heart strain.  An acute thrombotic event seems less likely given her presentation.  I do however agree with Dr Nishan that cath is prudent at this point.  She will proceed with cath today.  We will defer decision regarding PPM until after her cath.  2. Complete heart block The patient has symptomatic complete heart block.  She has a known LBBB which is representative of chronic intrinsic conduction disease.  She is on no AV nodal agents.  We will proceed with cath as above and make a decision about pacing afterwards.  3. HTN Stable No change required today   

## 2013-06-11 NOTE — Op Note (Signed)
SURGEON:  Hillis Range, MD     PREPROCEDURE DIAGNOSIS:  Symptomatic complete heart block    POSTPROCEDURE DIAGNOSIS:  Symptomatic complete heart block     PROCEDURES:   1.  Pacemaker implantation.     INTRODUCTION:  Alexandra Wheeler is a 72 y.o. female with a history of complete heart block who presents today for pacemaker implantation.  The patient reports intermittent episodes of dizziness and fatigue over the past few days.  No reversible causes have been identified.  She has required temporary pacing for her complete heart block.  The patient therefore presents today for pacemaker implantation.     DESCRIPTION OF PROCEDURE:  Informed written consent was obtained, and  the patient was brought to the electrophysiology lab in a fasting state.  The patient required no sedation for the procedure today.  The patients left chest was prepped and draped in the usual sterile fashion by the EP lab staff. The skin overlying the left deltopectoral region was infiltrated with lidocaine for local analgesia.  A 4-cm incision was made over the left deltopectoral region.  A left subcutaneous pacemaker pocket was fashioned using a combination of sharp and blunt dissection. Electrocautery was required to assure hemostasis.    RA/RV Lead Placement: The left axillary vein was therefore cannulated.  Through the left axillary vein, a [employer] model [phone] (serial number  [id number]) right atrial lead and a [facility] model [phone] (serial number  [id number]) right ventricular lead were advanced with fluoroscopic visualization into the right atrial appendage and right ventricular apex positions respectively.  Initial atrial lead P- waves measured 4 mV with impedance of 619 ohms and a threshold of 0.3 V at 0.5 msec.  Right ventricular lead R-waves measured 28 mV with an impedance of  862 ohms and a threshold of 0.4 V at 0.5 msec.  Both leads were secured to the pectoralis fascia using #2-0 silk over the suture  sleeves.   Device Placement:  The leads were then connected to a [facility] Assurity DR  model [id number] (serial number  [id number] ) pacemaker.  The pocket was irrigated with copious gentamicin solution.  The pacemaker was then placed into the pocket.  The pocket was then closed in 2 layers with 2.0 Vicryl suture for the subcutaneous and subcuticular layers.  Steri-  Strips and a sterile dressing were then applied.  There were no early apparent complications.     CONCLUSIONS:   1. Successful implantation of a [employer] Assurity DR  dual-chamber pacemaker for symptomatic complete heart block  2. No early apparent complications.           Hillis Range, MD 06/11/2013 11:53 AM

## 2013-06-11 NOTE — CV Procedure (Signed)
   Cardiac Catheterization Operative Report  Alexandra Wheeler [id number] 8/5/20149:49 AM [name],[name], MD  Procedure Performed:  1. Left Heart Catheterization 2. Selective Coronary Angiography 3. Left ventricular angiogram 4. Placement of temporary transvenous pacemaker  Operator: Verne Carrow, MD  Indication: 72 yo female admitted with complete heart block, dizziness and mild troponin elevation. Plans for cardiac cath to exclude obstructive CAD then placement of temporary pacemaker while awaiting permanent pacemaker.                                      Procedure Details: The risks, benefits, complications, treatment options, and expected outcomes were discussed with the patient. The patient and/or family concurred with the proposed plan, giving informed consent. The patient was brought to the cath lab after IV hydration was begun and oral premedication was given. The patient was further sedated with Versed and Fentanyl. The right groin was prepped and draped in the usual manner. Using the modified Seldinger access technique, a 6 French sheath was placed in the right femoral artery. A 6 French sheath was placed in the right femoral vein. Standard diagnostic catheters were used to perform selective coronary angiography. A pigtail catheter was used to perform a left ventricular angiogram. A single electrode transvenous pacemaker was placed from the right femoral vein.   There were no immediate complications. The patient was taken to the recovery area in stable condition.   Hemodynamic Findings: Central aortic pressure: 103/67 Left ventricular pressure: 123/6/11  Angiographic Findings:  Left main:  Eccentric 10% ostial stenosis.   Left Anterior Descending Artery: Large caliber vessel that courses to the apex. Mild luminal irregularities in the mid vessel. Several small diagonal branches.   Circumflex Artery: Large caliber vessel with large intermediate branch and large  OM branch. No obstructive disease.   Right Coronary Artery: Moderate caliber dominant vessel with no obstructive disease.   Left Ventricular Angiogram: LVEF=50-55% with subtle hypokinesis of the antero-apical wall.   Impression: 1. Mild non-obstructive CAD 2. Preserved LV systolic function 3. Complete heart block, now s/p insertion of temporary transvenous pacemaker.   Recommendations: No further ischemic evaluation. She will likely need permanent pacing. Dr. Johney Frame is following. Continue temporary transvenous pacing until decision made regarding timing of permanent pacemaker.        Complications:  None. The patient tolerated the procedure well.

## 2013-06-12 ENCOUNTER — Inpatient Hospital Stay (HOSPITAL_COMMUNITY): Payer: Medicare PPO

## 2013-06-12 LAB — BASIC METABOLIC PANEL
BUN: 10 mg/dL (ref 6–23)
Calcium: 8.8 mg/dL (ref 8.4–10.5)
Creatinine, Ser: 0.76 mg/dL (ref 0.50–1.10)
GFR calc non Af Amer: 82 mL/min — ABNORMAL LOW (ref >90)
Glucose, Bld: 96 mg/dL (ref 70–99)

## 2013-06-12 NOTE — Discharge Instructions (Signed)
° °  Supplemental Discharge Instructions for  Pacemaker/Defibrillator Patients  Activity No heavy lifting or vigorous activity with your left/right arm for 6 to 8 weeks.  Do not raise your left/right arm above your head for one week.  Gradually raise your affected arm as drawn below.           08/08                      08/09                        08/10                     08/11       NO DRIVING for 1 week; you may begin driving on 78/29/5621. WOUND CARE   Keep the wound area clean and dry.  Do not get this area wet for one week. No showers for one week; you may shower on 06/20/2013.   The tape/steri-strips on your wound will fall off; do not pull them off.  No bandage is needed on the site.  DO  NOT apply any creams, oils, or ointments to the wound area.   If you notice any drainage or discharge from the wound, any swelling or bruising at the site, or you develop a fever > 101? F after you are discharged home, call the office at once.  Special Instructions   You are still able to use cellular telephones; use the ear opposite the side where you have your pacemaker/defibrillator.  Avoid carrying your cellular phone near your device.   When traveling through airports, show security personnel your identification card to avoid being screened in the metal detectors.  Ask the security personnel to use the hand wand.   Avoid arc welding equipment, MRI testing (magnetic resonance imaging), TENS units (transcutaneous nerve stimulators).  Call the office for questions about other devices.   Avoid electrical appliances that are in poor condition or are not properly grounded.   Microwave ovens are safe to be near or to operate.

## 2013-06-12 NOTE — Discharge Summary (Signed)
ELECTROPHYSIOLOGY DISCHARGE SUMMARY    Patient ID: Alexandra Wheeler,  MRN: [id number], DOB/AGE: May 18, 1941 72 y.o.  Admit date: 06/10/2013 Discharge date: 06/12/2013  Primary Care Physician: Nadean Corwin, MD  Primary Cardiologist / EP: Shirlee Latch, MD / Johney Frame, MD  Primary Discharge Diagnosis:  1. Complete heart block s/p PPM implantation  - Normal pacemaker function  2. Focal basal hypertrophy   - has been reluctant start beta blockers and wishes to discuss further with Dr Shirlee Latch in follow-up  3. Mild, nonobstructive CAD  4. Preserved LV function  Secondary Discharge Diagnoses:  1. HTN 2. Dyslipidemia 3. GERD 4. LBBB 5. Anxiety  Procedures This Admission:  1. Cardiac catheterization and temporary pacing wire placement 06/11/2013 Hemodynamic Findings:  Central aortic pressure: 103/67  Left ventricular pressure: 123/6/11  Angiographic Findings:  Left main: Eccentric 10% ostial stenosis.  Left Anterior Descending Artery: Large caliber vessel that courses to the apex. Mild luminal irregularities in the mid vessel. Several small diagonal branches.  Circumflex Artery: Large caliber vessel with large intermediate branch and large OM branch. No obstructive disease.  Right Coronary Artery: Moderate caliber dominant vessel with no obstructive disease.  Left Ventricular Angiogram: LVEF=50-55% with subtle hypokinesis of the antero-apical wall.  Impression:  1. Mild non-obstructive CAD  2. Preserved LV systolic function  3. Complete heart block, now s/p insertion of temporary transvenous pacemaker.  2. Permanent pacemaker implantation 06/11/2013 RA lead - [employer] Jude Medical model [phone] (serial number [id number])  RV lead Educational psychologist Medical model [phone] (serial number [id number])  Device - [employer] Jude Medical Assurity DR model [phone] (serial number [id number])   History and Hospital Course:  Alexandra Wheeler is a 72 year old woman with known LBBB, HTN, dyslipidemia and asymmetric focal  basal septal hypertrophy with mild SAM who presented 06/10/2013 with weakness and DOE. She was found to have complete heart block on admission. Troponin was elevated; therefore, she underwent definitive evaluation of her coronary anatomy with cardiac catheterization on 06/11/2013. This revealed mild, nonobstructive CAD with preserved LV function. A temporary pacing wire was placed. She was evaluated by Dr. Johney Frame. There were no reversible causes for CHB identified; therefore, she met criteria for permanent pacing. She then underwent permanent pacemaker implantation on 06/11/2013. Ms. Grenz tolerated this procedure well without any immediate complication. She remains hemodynamically stable and afebrile. Her chest xray shows stable lead placement without pneumothorax. Her device interrogation shows normal PPM function with stable lead parameters/measurements. Her implant site is intact without significant bleeding or hematoma. She has been given discharge instructions including wound care and activity restrictions. she will follow-up in 10 days for wound check. There were no changes made to her medications. She has been seen, examined and deemed stable for discharge today by Dr. Hillis Range.  Discharge Vitals: Blood pressure 132/68, pulse 60, temperature 98.6 F (37 C), temperature source Oral, resp. rate 20, height 5\' 5"  (1.651 m), weight 151 lb 7.3 oz (68.7 kg), SpO2 96.00%.   Labs: Lab Results  Component Value Date   WBC 6.2 06/10/2013   HGB 14.1 06/10/2013   HCT 42.0 06/10/2013   MCV 86.2 06/10/2013   PLT 185 06/10/2013     Recent Labs Lab 06/11/13 0500 06/12/13 0430  NA 142 141  K 3.8 3.7  CL 107 108  CO2 24 25  BUN 13 10  CREATININE 0.80 0.76  CALCIUM 9.0 8.8  PROT 5.9*  --   BILITOT 0.4  --   ALKPHOS 73  --  ALT 112*  --   AST 35  --   GLUCOSE 90 96   Lab Results  Component Value Date   TROPONINI 1.06* 06/11/2013     Recent Labs  06/10/13 1430  INR 0.98    Disposition:  The  patient is being discharged in stable condition.  Follow-up:     Follow-up Information   Follow up with [facility] On 06/20/2013. (At 4:00 PM for wound check)    Contact information:   [address] [place] [address] [phone]      Follow up with Marca Ancona, MD On 08/14/2013. (At 4:30 PM for hospital follow-up)    Contact information:   [address] [place] [address] [phone]     Discharge Medications:    Medication List         ALPRAZolam 0.5 MG tablet  Commonly known as:  XANAX  Take 0.5 mg by mouth at bedtime as needed for anxiety.     BIOTIN PO  Take 1 tablet by mouth daily.     CALCIUM + D PO  Take 1 tablet by mouth daily.     citalopram 10 MG tablet  Commonly known as:  CELEXA  Take 10 mg by mouth daily.     FISH OIL PO  Take 1 capsule by mouth daily.     FOLIC ACID PO  Take 1 tablet by mouth daily.     MAGNESIUM PO  Take 1 tablet by mouth daily.     multivitamin tablet  Take 1 tablet by mouth daily.     RED YEAST RICE PO  Take 1 tablet by mouth daily.     VITAMIN C PO  Take 1 tablet by mouth daily.     VITAMIN D PO  Take 1 tablet by mouth daily.       Duration of Discharge Encounter: Greater than 30 minutes including physician time.  Limmie Patricia, PA-C 06/12/2013, 8:07 AM   Hillis Range, MD

## 2013-06-12 NOTE — Progress Notes (Signed)
ELECTROPHYSIOLOGY ROUNDING NOTE    Patient Name: Alexandra Wheeler Date of Encounter: 06/12/2013    SUBJECTIVE:Patient feels well this morning.  No chest pain or shortness of breath.  S/p cardiac catheterization and pacemaker implant yesterday.   TELEMETRY: Reviewed telemetry pt  P synchronous pacing Filed Vitals:   06/11/13 1920 06/11/13 2031 06/12/13 0013 06/12/13 0413  BP: 125/82 129/85 139/84 132/68  Pulse: 60 60 64 60  Temp:  97.8 F (36.6 C) 98.1 F (36.7 C) 98.6 F (37 C)  TempSrc:  Oral Oral Oral  Resp:  18 20 20   Height:      Weight:   151 lb 7.3 oz (68.7 kg)   SpO2: 95% 95% 96% 96%    Intake/Output Summary (Last 24 hours) at 06/12/13 0647 Last data filed at 06/12/13 0253  Gross per 24 hour  Intake    700 ml  Output   1100 ml  Net   -400 ml    CURRENT MEDICATIONS: . calcium-vitamin D  1 tablet Oral Daily  . cholecalciferol  1,000 Units Oral Daily  . citalopram  10 mg Oral Daily  . folic acid  1 mg Oral Daily  . magnesium oxide  200 mg Oral Daily  . multivitamin with minerals  1 tablet Oral Daily  . omega-3 acid ethyl esters  1 g Oral Daily  . vitamin C  500 mg Oral Daily   Physical Exam: Filed Vitals:   06/11/13 1920 06/11/13 2031 06/12/13 0013 06/12/13 0413  BP: 125/82 129/85 139/84 132/68  Pulse: 60 60 64 60  Temp:  97.8 F (36.6 C) 98.1 F (36.7 C) 98.6 F (37 C)  TempSrc:  Oral Oral Oral  Resp:  18 20 20   Height:      Weight:   151 lb 7.3 oz (68.7 kg)   SpO2: 95% 95% 96% 96%    GEN- The patient is well appearing, alert and oriented x 3 today.   Head- normocephalic, atraumatic Eyes-  Sclera clear, conjunctiva pink Ears- hearing intact Oropharynx- clear Neck- supple, no JVP  Lungs- Clear to ausculation bilaterally, normal work of breathing Heart- Regular rate and rhythm, no murmurs, rubs or gallops, PMI not laterally displaced GI- soft, NT, ND, + BS Extremities- no clubbing, cyanosis, or edema MS- no significant deformity or  atrophy Skin- pacemaker pocket is without hematoma  LABS: Basic Metabolic Panel:  Recent Labs  16/10/96 1430 06/11/13 0500 06/12/13 0430  NA 143 142 141  K 3.6 3.8 3.7  CL 105 107 108  CO2 27 24 25   GLUCOSE 108* 90 96  BUN 12 13 10   CREATININE 0.86 0.80 0.76  CALCIUM 9.9 9.0 8.8  MG 2.2  --   --    Liver Function Tests:  Recent Labs  06/10/13 1430 06/11/13 0500  AST 52* 35  ALT 161* 112*  ALKPHOS 88 73  BILITOT 0.4 0.4  PROT 6.9 5.9*  ALBUMIN 3.7 3.1*   CBC:  Recent Labs  06/10/13 1430  WBC 6.2  NEUTROABS 3.4  HGB 14.1  HCT 42.0  MCV 86.2  PLT 185   Cardiac Enzymes:  Recent Labs  06/10/13 1932 06/11/13 0038 06/11/13 0500  TROPONINI 1.18* 2.07* 1.06*     Radiology/Studies:  Final result pending, leads in stable position. No obvious pneumothorax  DEVICE INTERROGATION: Device interrogation reviewed and normal (see paper chart)  Principal Problem:   CHB (complete heart block) Active Problems:   LBBB (left bundle branch block)   Elevated troponin  Assessment and Plan:   1. Complete heart block Normal pacemaker function Routine wound care and follow-up  2, HTN Stable No change required today  3. Focal basal hypertrophy She has been reluctant start beta blockers and wishes to discuss further with Dr Shirlee Latch  DC to home today

## 2013-06-15 ENCOUNTER — Encounter (HOSPITAL_COMMUNITY): Payer: Self-pay | Admitting: *Deleted

## 2013-06-19 ENCOUNTER — Telehealth: Payer: Self-pay | Admitting: Cardiology

## 2013-06-19 NOTE — Telephone Encounter (Signed)
New prob  Pt states she has a rash developing at the site she had a cath. She wants to speak with a nurse.

## 2013-06-19 NOTE — Telephone Encounter (Signed)
Pt states she developed a rash today on the front and back of both upper legs. She states there is no swelling, redness,rash or drainage at the cath site. She denies any new meds. I reviewed with Dr Binnie Kand benadryl cream to rash PRN, use benadryl PO 25mg  every 6 hours prn. Contact PCP if rash does not improve. Pt verbalized understanding.

## 2013-06-20 ENCOUNTER — Ambulatory Visit (INDEPENDENT_AMBULATORY_CARE_PROVIDER_SITE_OTHER): Payer: Medicare PPO | Admitting: *Deleted

## 2013-06-20 DIAGNOSIS — I442 Atrioventricular block, complete: Secondary | ICD-10-CM

## 2013-06-20 DIAGNOSIS — Z950 Presence of cardiac pacemaker: Secondary | ICD-10-CM

## 2013-06-20 NOTE — Progress Notes (Signed)
Pt seen in device clinic for follow up of recently implanted pacemaker.  Wound well healed.  No redness, swelling, or edema.  Steri-strips removed today.   Device interrogated and found to be functioning normally.  No changes made today. See PaceArt for full details.  Pt denies chest pain, shortness of breath, palpitations, or dizziness.  ROV w/ Dr. Johney Frame 09/16/13 @ 10:15  Alexandra Wheeler 06/20/2013 5:24 PM

## 2013-06-21 LAB — PACEMAKER DEVICE OBSERVATION
AL AMPLITUDE: 1.4 mv
AL IMPEDENCE PM: 525 Ohm
BAMS-0001: 150 {beats}/min
BATTERY VOLTAGE: 3.0982 V
RV LEAD IMPEDENCE PM: 662.5 Ohm
VENTRICULAR PACING PM: 99

## 2013-07-01 ENCOUNTER — Telehealth: Payer: Self-pay | Admitting: Cardiology

## 2013-07-01 NOTE — Telephone Encounter (Signed)
That is fine with me.

## 2013-07-01 NOTE — Telephone Encounter (Signed)
That's fine but she just had a pacer put in and needs to see Dr Johney Frame first and pacer clinic

## 2013-07-01 NOTE — Telephone Encounter (Signed)
New Problem  Pt request to see Dr. Eden Emms instead of Dr. Shirlee Latch Dr. Eden Emms seen her in the hospital

## 2013-07-02 NOTE — Telephone Encounter (Signed)
APPT RESCHEDULED WITH DR Eden Emms   SEE APPT./CY LMTCB .Zack Seal

## 2013-07-04 NOTE — Telephone Encounter (Signed)
PT AWARE OF  APPT   .Alexandra Wheeler

## 2013-07-16 ENCOUNTER — Encounter: Payer: Self-pay | Admitting: Internal Medicine

## 2013-08-14 ENCOUNTER — Encounter: Payer: Medicare PPO | Admitting: Cardiology

## 2013-09-16 ENCOUNTER — Encounter: Payer: Self-pay | Admitting: Internal Medicine

## 2013-09-16 ENCOUNTER — Ambulatory Visit (INDEPENDENT_AMBULATORY_CARE_PROVIDER_SITE_OTHER): Payer: Medicare PPO | Admitting: Internal Medicine

## 2013-09-16 VITALS — BP 131/91 | HR 66 | Ht 63.0 in | Wt 153.1 lb

## 2013-09-16 DIAGNOSIS — I442 Atrioventricular block, complete: Secondary | ICD-10-CM

## 2013-09-16 DIAGNOSIS — I10 Essential (primary) hypertension: Secondary | ICD-10-CM

## 2013-09-16 NOTE — Patient Instructions (Signed)
Your physician wants you to follow-up in: 06/2014 with Dr Johney Frame Bonita Quin will receive a reminder letter in the mail two months in advance. If you don't receive a letter, please call our office to schedule the follow-up appointment.    Remote monitoring is used to monitor your Pacemaker or ICD from home. This monitoring reduces the number of office visits required to check your device to one time per year. It allows [place] to keep an eye on the functioning of your device to ensure it is working properly. You are scheduled for a device check from home on 12/19/13. You may send your transmission at any time that day. If you have a wireless device, the transmission will be sent automatically. After your physician reviews your transmission, you will receive a postcard with your next transmission date.

## 2013-09-16 NOTE — Progress Notes (Signed)
PCP: Nadean Corwin, MD Primary Cardiologist:  Dr Suszanne Conners is a 72 y.o. female who presents today for routine electrophysiology followup.  Since having her PPM implanted, the patient reports doing very well.  Her exercise tolerance is much improved and dizziness is resolved. Today, she denies symptoms of palpitations, chest pain, shortness of breath,  lower extremity edema,  or syncope.  The patient is otherwise without complaint today.   Past Medical History  Diagnosis Date  . Osteoporosis   . DI (detrusor instability)   . Atrophic vaginitis   . Anxiety   . Colon polyps   . H/O echocardiogram     a. Echo 11/2012:EF 50-55%, focal basal septal hypertrophy with some mitral valve SAM (mild) and MR.  This may be a sigmoid septum with advanced age or could be a variant of hypertrophic cardiomyopathy.  Marland Kitchen LBBB (left bundle branch block)   . Depression   . HTN (hypertension)   . GERD (gastroesophageal reflux disease)   . Hyperlipidemia   . Chest pain     a. Adenosine sestamibi (1/14) with no evidence of ischemia or infarction, EF 63%.   . Complete heart block 06-2013    status post pacemaker implantation by Dr Johney Frame 06-11-2013   Past Surgical History  Procedure Laterality Date  . Breast surgery      Breast cyst  . Tubal ligation    . Hemorrhoid surgery    . Pacemaker insertion  06-11-2013    STJ dual chamber pacemaker implanted by Dr Johney Frame for complete heart block    Current Outpatient Prescriptions  Medication Sig Dispense Refill  . ALPRAZolam (XANAX) 0.5 MG tablet Take 0.5 mg by mouth at bedtime as needed for anxiety.       . Ascorbic Acid (VITAMIN C PO) Take 1 tablet by mouth daily.       Marland Kitchen BIOTIN PO Take 1 tablet by mouth daily.       . Calcium Carbonate-Vitamin D (CALCIUM + D PO) Take 1 tablet by mouth daily.       . Cholecalciferol (VITAMIN D PO) Take 1 tablet by mouth daily.       . citalopram (CELEXA) 10 MG tablet Take 10 mg by mouth daily.      Marland Kitchen FOLIC  ACID PO Take 1 tablet by mouth daily.       Marland Kitchen MAGNESIUM PO Take 1 tablet by mouth daily.       . Multiple Vitamin (MULTIVITAMIN) tablet Take 1 tablet by mouth daily.      . Omega-3 Fatty Acids (FISH OIL PO) Take 1 capsule by mouth daily.       . Red Yeast Rice Extract (RED YEAST RICE PO) Take 1 tablet by mouth daily.        No current facility-administered medications for this visit.    Physical Exam: Filed Vitals:   09/16/13 1110  BP: 131/91  Pulse: 66  Height: 5\' 3"  (1.6 m)  Weight: 153 lb 1.9 oz (69.455 kg)    GEN- The patient is well appearing, alert and oriented x 3 today.   Head- normocephalic, atraumatic Eyes-  Sclera clear, conjunctiva pink Ears- hearing intact Oropharynx- clear Lungs- Clear to ausculation bilaterally, normal work of breathing Chest- pacemaker pocket is well healed Heart- Regular rate and rhythm, no murmurs, rubs or gallops, PMI not laterally displaced GI- soft, NT, ND, + BS Extremities- no clubbing, cyanosis, or edema  Pacemaker interrogation- reviewed in detail today,  See PACEART report  Assessment and Plan:  1. Complete heart block Normal pacemaker function See Pace Art report No changes today  2. HTN Stable No change required today  Merlin Return 8/15 for device follow-up

## 2013-09-24 LAB — MDC_IDC_ENUM_SESS_TYPE_INCLINIC
Brady Statistic RV Percent Paced: 99.87 %
Date Time Interrogation Session: 20141110171749
Implantable Pulse Generator Model: 2240
Implantable Pulse Generator Serial Number: 7523768
Lead Channel Impedance Value: 562.5 Ohm
Lead Channel Impedance Value: 800 Ohm
Lead Channel Pacing Threshold Pulse Width: 0.4 ms
Lead Channel Setting Sensing Sensitivity: 4 mV

## 2013-10-02 ENCOUNTER — Encounter: Payer: Medicare PPO | Admitting: Cardiovascular Disease

## 2013-10-08 ENCOUNTER — Encounter: Payer: Self-pay | Admitting: Physician Assistant

## 2013-10-08 DIAGNOSIS — R7303 Prediabetes: Secondary | ICD-10-CM

## 2013-10-08 DIAGNOSIS — I10 Essential (primary) hypertension: Secondary | ICD-10-CM | POA: Insufficient documentation

## 2013-10-08 DIAGNOSIS — E782 Mixed hyperlipidemia: Secondary | ICD-10-CM | POA: Insufficient documentation

## 2013-10-08 DIAGNOSIS — E559 Vitamin D deficiency, unspecified: Secondary | ICD-10-CM

## 2013-10-08 DIAGNOSIS — E785 Hyperlipidemia, unspecified: Secondary | ICD-10-CM

## 2013-10-08 DIAGNOSIS — R7309 Other abnormal glucose: Secondary | ICD-10-CM | POA: Insufficient documentation

## 2013-10-10 ENCOUNTER — Ambulatory Visit: Payer: Self-pay | Admitting: Physician Assistant

## 2013-10-10 ENCOUNTER — Other Ambulatory Visit (HOSPITAL_COMMUNITY)
Admission: RE | Admit: 2013-10-10 | Discharge: 2013-10-10 | Disposition: A | Discharge status: Home / Self Care | Payer: Medicare PPO | Source: Ambulatory Visit | Attending: Gynecology | Admitting: Gynecology

## 2013-10-10 ENCOUNTER — Encounter: Payer: Self-pay | Admitting: Women's Health

## 2013-10-10 ENCOUNTER — Ambulatory Visit (INDEPENDENT_AMBULATORY_CARE_PROVIDER_SITE_OTHER): Payer: Medicare PPO | Admitting: Women's Health

## 2013-10-10 VITALS — BP 126/88 | Ht 64.0 in | Wt 155.4 lb

## 2013-10-10 DIAGNOSIS — Z124 Encounter for screening for malignant neoplasm of cervix: Secondary | ICD-10-CM

## 2013-10-10 NOTE — Progress Notes (Signed)
Alexandra Wheeler Oct 22, 1941 [id number]    History:    The patient presents for breast and pelvic exam. Postmenopausal with no bleeding/no HRT. Normal Pap and mammogram history. Osteoporosis T score -3.3 at spine has had numerous side effects with Fosamax, E. visit, and request and declines further treatment. Negative colonoscopy 2004. Had a pacemaker placed 2014 and has been better after. Not sexually active.  Past medical history, past surgical history, family history and social history were all reviewed and documented in the EPIC chart. Retired from [employer]. History of numerous lipomas.   ROS:  A  ROS was performed and pertinent positives and negatives are included in the history.  Exam:  Filed Vitals:   10/10/13 0819  BP: 126/88    General appearance:  Normal Head/Neck:  Normal, without cervical or supraclavicular adenopathy. Thyroid:  Symmetrical, normal in size, without palpable masses or nodularity. Respiratory  Effort:  Normal  Auscultation:  Clear without wheezing or rhonchi Cardiovascular  Auscultation:  Regular rate, without rubs, murmurs or gallops  Edema/varicosities:  Not grossly evident Abdominal  Soft,nontender, without masses, guarding or rebound.  Liver/spleen:  No organomegaly noted  Hernia:  None appreciated  Skin  Inspection:  Grossly normal soft mobile 3 cm probable lipoma on left inner wrist, 2 cm soft mobile probable lipoma left axilla.  Palpation:  Grossly normal Neurologic/psychiatric  Orientation:  Normal with appropriate conversation.  Mood/affect:  Normal  Genitourinary    Breasts: Examined lying and sitting.     Right: Without masses, retractions, discharge or axillary adenopathy.     Left: Without masses, retractions, discharge or axillary adenopathy.   Inguinal/mons:  Normal without inguinal adenopathy  External genitalia:  Normal  BUS/Urethra/Skene's glands:  Normal  Bladder:  Normal  Vagina:  Normal  Cervix:   Normal  Uterus:   normal in size, shape and contour.  Midline and mobile  Adnexa/parametria:     Rt: Without masses or tenderness.   Lt: Without masses or tenderness.  Anus and perineum: Normal  Digital rectal exam: Normal sphincter tone without palpated masses or tenderness  Assessment/Plan:  72 y.o.  WWF G2P1 for breast and pelvic exam with no complaints.  Postmenopausal/no bleeding/no HRT Osteoporosis declines further treatment Pacemaker 2014 Probable lipoma on left lower wrist and left axilla Hypertension-primary care labs and meds  Plan: Alert primary care to probable lipomas at scheduled office visit in 2 months. SBE's, continue annual mammogram, reviewed 3D tomography and encouraged history of dense breast. Schedule repeat screening colonoscopy. Current on all vaccines. Regular exercise, home safety and fall prevention discussed. Continue care with primary care for labs. Pap. Pap normal 2011, new screening guidelines reviewed.    Harrington Challenger WHNP, 1:28 PM 10/10/2013

## 2013-10-10 NOTE — Patient Instructions (Signed)
Health Recommendations for Postmenopausal Women Respected and ongoing research has looked at the most common causes of death, disability, and poor quality of life in postmenopausal women. The causes include heart disease, diseases of blood vessels, diabetes, depression, cancer, and bone loss (osteoporosis). Many things can be done to help lower the chances of developing these and other common problems: CARDIOVASCULAR DISEASE Heart Disease: A heart attack is a medical emergency. Know the signs and symptoms of a heart attack. Below are things women can do to reduce their risk for heart disease.   Do not smoke. If you smoke, quit.  Aim for a healthy weight. Being overweight causes many preventable deaths. Eat a healthy and balanced diet and drink an adequate amount of liquids.  Get moving. Make a commitment to be more physically active. Aim for 30 minutes of activity on most, if not all days of the week.  Eat for heart health. Choose a diet that is low in saturated fat and cholesterol and eliminate trans fat. Include whole grains, vegetables, and fruits. Read and understand the labels on food containers before buying.  Know your numbers. Ask your caregiver to check your blood pressure, cholesterol (total, HDL, LDL, triglycerides) and blood glucose. Work with your caregiver on improving your entire clinical picture.  High blood pressure. Limit or stop your table salt intake (try salt substitute and food seasonings). Avoid salty foods and drinks. Read labels on food containers before buying. Eating well and exercising can help control high blood pressure. STROKE  Stroke is a medical emergency. Stroke may be the result of a blood clot in a blood vessel in the brain or by a brain hemorrhage (bleeding). Know the signs and symptoms of a stroke. To lower the risk of developing a stroke:  Avoid fatty foods.  Quit smoking.  Control your diabetes, blood pressure, and irregular heart rate. THROMBOPHLEBITIS  (BLOOD CLOT) OF THE LEG  Becoming overweight and leading a stationary lifestyle may also contribute to developing blood clots. Controlling your diet and exercising will help lower the risk of developing blood clots. CANCER SCREENING  Breast Cancer: Take steps to reduce your risk of breast cancer.  You should practice "breast self-awareness." This means understanding the normal appearance and feel of your breasts and should include breast self-examination. Any changes detected, no matter how small, should be reported to your caregiver.  After age 40, you should have a clinical breast exam (CBE) every year.  Starting at age 40, you should consider having a mammogram (breast X-ray) every year.  If you have a family history of breast cancer, talk to your caregiver about genetic screening.  If you are at high risk for breast cancer, talk to your caregiver about having an MRI and a mammogram every year.  Intestinal or Stomach Cancer: Tests to consider are a rectal exam, fecal occult blood, sigmoidoscopy, and colonoscopy. Women who are high risk may need to be screened at an earlier age and more often.  Cervical Cancer:  Beginning at age 30, you should have a Pap test every 3 years as long as the past 3 Pap tests have been normal.  If you have had past treatment for cervical cancer or a condition that could lead to cancer, you need Pap tests and screening for cancer for at least 20 years after your treatment.  If you had a hysterectomy for a problem that was not cancer or a condition that could lead to cancer, then you no longer need Pap tests.    If you are between ages 65 and 70, and you have had normal Pap tests going back 10 years, you no longer need Pap tests.  If Pap tests have been discontinued, risk factors (such as a new sexual partner) need to be reassessed to determine if screening should be resumed.  Some medical problems can increase the chance of getting cervical cancer. In these  cases, your caregiver may recommend more frequent screening and Pap tests.  Uterine Cancer: If you have vaginal bleeding after reaching menopause, you should notify your caregiver.  Ovarian cancer: Other than yearly pelvic exams, there are no reliable tests available to screen for ovarian cancer at this time except for yearly pelvic exams.  Lung Cancer: Yearly chest X-rays can detect lung cancer and should be done on high risk women, such as cigarette smokers and women with chronic lung disease (emphysema).  Skin Cancer: A complete body skin exam should be done at your yearly examination. Avoid overexposure to the sun and ultraviolet light lamps. Use a strong sun block cream when in the sun. All of these things are important in lowering the risk of skin cancer. MENOPAUSE Menopause Symptoms: Hormone therapy products are effective for treating symptoms associated with menopause:  Moderate to severe hot flashes.  Night sweats.  Mood swings.  Headaches.  Tiredness.  Loss of sex drive.  Insomnia.  Other symptoms. Hormone replacement carries certain risks, especially in older women. Women who use or are thinking about using estrogen or estrogen with progestin treatments should discuss that with their caregiver. Your caregiver will help you understand the benefits and risks. The ideal dose of hormone replacement therapy is not known. The Food and Drug Administration (FDA) has concluded that hormone therapy should be used only at the lowest doses and for the shortest amount of time to reach treatment goals.  OSTEOPOROSIS Protecting Against Bone Loss and Preventing Fracture: If you use hormone therapy for prevention of bone loss (osteoporosis), the risks for bone loss must outweigh the risk of the therapy. Ask your caregiver about other medications known to be safe and effective for preventing bone loss and fractures. To guard against bone loss or fractures, the following is recommended:  If  you are less than age 50, take 1000 mg of calcium and at least 600 mg of Vitamin D per day.  If you are greater than age 50 but less than age 70, take 1200 mg of calcium and at least 600 mg of Vitamin D per day.  If you are greater than age 70, take 1200 mg of calcium and at least 800 mg of Vitamin D per day. Smoking and excessive alcohol intake increases the risk of osteoporosis. Eat foods rich in calcium and vitamin D and do weight bearing exercises several times a week as your caregiver suggests. DIABETES Diabetes Melitus: If you have Type I or Type 2 diabetes, you should keep your blood sugar under control with diet, exercise and recommended medication. Avoid too many sweets, starchy and fatty foods. Being overweight can make control more difficult. COGNITION AND MEMORY Cognition and Memory: Menopausal hormone therapy is not recommended for the prevention of cognitive disorders such as Alzheimer's disease or memory loss.  DEPRESSION  Depression may occur at any age, but is common in elderly women. The reasons may be because of physical, medical, social (loneliness), or financial problems and needs. If you are experiencing depression because of medical problems and control of symptoms, talk to your caregiver about this. Physical activity and   exercise may help with mood and sleep. Community and volunteer involvement may help your sense of value and worth. If you have depression and you feel that the problem is getting worse or becoming severe, talk to your caregiver about treatment options that are best for you. ACCIDENTS  Accidents are common and can be serious in the elderly woman. Prepare your house to prevent accidents. Eliminate throw rugs, place hand bars in the bath, shower and toilet areas. Avoid wearing high heeled shoes or walking on wet, snowy, and icy areas. Limit or stop driving if you have vision or hearing problems, or you feel you are unsteady with you movements and  reflexes. HEPATITIS C Hepatitis C is a type of viral infection affecting the liver. It is spread mainly through contact with blood from an infected person. It can be treated, but if left untreated, it can lead to severe liver damage over years. Many people who are infected do not know that the virus is in their blood. If you are a "baby-boomer", it is recommended that you have one screening test for Hepatitis C. IMMUNIZATIONS  Several immunizations are important to consider having during your senior years, including:   Tetanus, diptheria, and pertussis booster shot.  Influenza every year before the flu season begins.  Pneumonia vaccine.  Shingles vaccine.  Others as indicated based on your specific needs. Talk to your caregiver about these. Document Released: 12/16/2005 Document Revised: 10/10/2012 Document Reviewed: 08/11/2008 [employer] Patient Information 2014 [employer].  

## 2013-10-16 ENCOUNTER — Encounter: Payer: Medicare PPO | Admitting: Cardiovascular Disease

## 2013-11-11 ENCOUNTER — Other Ambulatory Visit: Payer: Self-pay | Admitting: Gastroenterology

## 2013-11-14 ENCOUNTER — Encounter: Payer: Medicare PPO | Admitting: Cardiovascular Disease

## 2013-12-19 ENCOUNTER — Encounter: Payer: Self-pay | Admitting: Internal Medicine

## 2013-12-19 ENCOUNTER — Ambulatory Visit (INDEPENDENT_AMBULATORY_CARE_PROVIDER_SITE_OTHER): Payer: Medicare PPO | Admitting: *Deleted

## 2013-12-19 DIAGNOSIS — I442 Atrioventricular block, complete: Secondary | ICD-10-CM

## 2013-12-19 LAB — MDC_IDC_ENUM_SESS_TYPE_REMOTE
Battery Remaining Longevity: 121 mo
Battery Voltage: 2.99 V
Implantable Pulse Generator Model: 2240
Lead Channel Impedance Value: 550 Ohm
Lead Channel Pacing Threshold Amplitude: 1.25 V
Lead Channel Pacing Threshold Pulse Width: 0.4 ms
Lead Channel Pacing Threshold Pulse Width: 0.4 ms
Lead Channel Setting Pacing Amplitude: 2 V
Lead Channel Setting Pacing Pulse Width: 0.4 ms
MDC IDC MSMT LEADCHNL RA PACING THRESHOLD AMPLITUDE: 0.5 V
MDC IDC MSMT LEADCHNL RA SENSING INTR AMPL: 1.6 mV
MDC IDC MSMT LEADCHNL RV IMPEDANCE VALUE: 730 Ohm
MDC IDC MSMT LEADCHNL RV SENSING INTR AMPL: 12 mV
MDC IDC PG SERIAL: 7523768
MDC IDC SESS DTM: 20150212104453
MDC IDC SET LEADCHNL RV PACING AMPLITUDE: 1.5 V
MDC IDC SET LEADCHNL RV SENSING SENSITIVITY: 4 mV
MDC IDC STAT BRADY AP VP PERCENT: 67 %
MDC IDC STAT BRADY AP VS PERCENT: 1 %
MDC IDC STAT BRADY AS VP PERCENT: 33 %
MDC IDC STAT BRADY AS VS PERCENT: 1 %
MDC IDC STAT BRADY RA PERCENT PACED: 67 %
MDC IDC STAT BRADY RV PERCENT PACED: 99 %

## 2013-12-25 ENCOUNTER — Encounter: Payer: Self-pay | Admitting: *Deleted

## 2014-01-01 ENCOUNTER — Telehealth: Payer: Self-pay | Admitting: Internal Medicine

## 2014-01-01 NOTE — Telephone Encounter (Signed)
LMOVM w/ my direct line. 

## 2014-01-01 NOTE — Telephone Encounter (Signed)
New message     Did we get her last device check from home

## 2014-01-06 ENCOUNTER — Encounter: Payer: Self-pay | Admitting: *Deleted

## 2014-01-07 NOTE — Telephone Encounter (Signed)
Updated pt we automatically received her transmission on 12/19/13. Updated pt to disregard the letter she received dated 12/25/13.

## 2014-01-09 ENCOUNTER — Ambulatory Visit: Payer: Self-pay | Admitting: Internal Medicine

## 2014-01-22 ENCOUNTER — Encounter: Payer: Self-pay | Admitting: Internal Medicine

## 2014-01-22 ENCOUNTER — Ambulatory Visit (INDEPENDENT_AMBULATORY_CARE_PROVIDER_SITE_OTHER): Payer: Medicare PPO | Admitting: Internal Medicine

## 2014-01-22 VITALS — BP 136/88 | HR 68 | Temp 98.1°F | Resp 16 | Ht 65.25 in | Wt 157.8 lb

## 2014-01-22 DIAGNOSIS — Z79899 Other long term (current) drug therapy: Secondary | ICD-10-CM

## 2014-01-22 DIAGNOSIS — I10 Essential (primary) hypertension: Secondary | ICD-10-CM

## 2014-01-22 DIAGNOSIS — R7303 Prediabetes: Secondary | ICD-10-CM

## 2014-01-22 DIAGNOSIS — E559 Vitamin D deficiency, unspecified: Secondary | ICD-10-CM

## 2014-01-22 DIAGNOSIS — E785 Hyperlipidemia, unspecified: Secondary | ICD-10-CM

## 2014-01-22 LAB — HEPATIC FUNCTION PANEL
ALT: 15 U/L (ref 0–35)
AST: 19 U/L (ref 0–37)
Albumin: 4 g/dL (ref 3.5–5.2)
Alkaline Phosphatase: 73 U/L (ref 39–117)
BILIRUBIN DIRECT: 0.1 mg/dL (ref 0.0–0.3)
BILIRUBIN INDIRECT: 0.4 mg/dL (ref 0.2–1.2)
Total Bilirubin: 0.5 mg/dL (ref 0.2–1.2)
Total Protein: 6.7 g/dL (ref 6.0–8.3)

## 2014-01-22 LAB — CBC WITH DIFFERENTIAL/PLATELET
BASOS PCT: 1 % (ref 0–1)
Basophils Absolute: 0 10*3/uL (ref 0.0–0.1)
EOS ABS: 0.1 10*3/uL (ref 0.0–0.7)
Eosinophils Relative: 2 % (ref 0–5)
HEMATOCRIT: 39.7 % (ref 36.0–46.0)
HEMOGLOBIN: 13 g/dL (ref 12.0–15.0)
LYMPHS ABS: 1.6 10*3/uL (ref 0.7–4.0)
Lymphocytes Relative: 41 % (ref 12–46)
MCH: 27.4 pg (ref 26.0–34.0)
MCHC: 32.7 g/dL (ref 30.0–36.0)
MCV: 83.6 fL (ref 78.0–100.0)
MONO ABS: 0.3 10*3/uL (ref 0.1–1.0)
Monocytes Relative: 7 % (ref 3–12)
Neutro Abs: 1.9 10*3/uL (ref 1.7–7.7)
Neutrophils Relative %: 49 % (ref 43–77)
Platelets: 206 10*3/uL (ref 150–400)
RBC: 4.75 MIL/uL (ref 3.87–5.11)
RDW: 14.2 % (ref 11.5–15.5)
WBC: 3.8 10*3/uL — ABNORMAL LOW (ref 4.0–10.5)

## 2014-01-22 LAB — LIPID PANEL
CHOL/HDL RATIO: 2.5 ratio
Cholesterol: 155 mg/dL (ref 0–200)
HDL: 63 mg/dL (ref >39)
LDL Cholesterol: 79 mg/dL (ref 0–99)
Triglycerides: 67 mg/dL (ref <150)
VLDL: 13 mg/dL (ref 0–40)

## 2014-01-22 LAB — BASIC METABOLIC PANEL WITH GFR
BUN: 10 mg/dL (ref 6–23)
CALCIUM: 9.2 mg/dL (ref 8.4–10.5)
CO2: 29 mEq/L (ref 19–32)
Chloride: 104 mEq/L (ref 96–112)
Creat: 0.79 mg/dL (ref 0.50–1.10)
GFR, EST AFRICAN AMERICAN: 86 mL/min
GFR, Est Non African American: 74 mL/min
GLUCOSE: 90 mg/dL (ref 70–99)
Potassium: 4.3 mEq/L (ref 3.5–5.3)
SODIUM: 142 meq/L (ref 135–145)

## 2014-01-22 LAB — HEMOGLOBIN A1C
Hgb A1c MFr Bld: 5.8 % — ABNORMAL HIGH (ref <5.7)
MEAN PLASMA GLUCOSE: 120 mg/dL — AB (ref <117)

## 2014-01-22 LAB — TSH: TSH: 0.743 u[IU]/mL (ref 0.350–4.500)

## 2014-01-22 LAB — MAGNESIUM: MAGNESIUM: 2.2 mg/dL (ref 1.5–2.5)

## 2014-01-22 MED ORDER — BISOPROLOL-HYDROCHLOROTHIAZIDE 5-6.25 MG PO TABS: 1.0000 | ORAL_TABLET | Freq: Every day | ORAL | Status: DC

## 2014-01-22 NOTE — Patient Instructions (Signed)

## 2014-01-22 NOTE — Progress Notes (Signed)
Patient ID: Alexandra Wheeler, female   DOB: 1941/01/11, 73 y.o.   MRN: [id number]    This very nice 73 y.o. female presents for 3 month follow up with Labile Hypertension, ASHD/CHB/Pacemaker (06/2013), Hyperlipidemia, Pre-Diabetes and Vitamin D Deficiency.    Labile HTN predates since 2000 and has been treated with prn HCTZ. . Today's BP: 136/88 mmHg.  She does report occasional throbbing HA.Patient was found to have CHB in Aug 2014 and had a permanent cardiac pacemaker inserted. Patient denies any cardiac type chest pain, palpitations, dyspnea/orthopnea/PND, dizziness, claudication, or dependent edema.     Hyperlipidemia is controlled with diet & meds. Last Cholesterol was 159, Triglycerides were 62, HDL 53 and LDL 94 in Aug 2014 - at goal. Patient denies myalgias or other med SE's.    Also, the patient has history of PreDiabetes with A1c 5.9 % in Feb 2014 and last A1c was 5.7% in Aug 2014. Patient denies any symptoms of reactive hypoglycemia, diabetic polys, paresthesias or visual blurring.   Further, Patient has history of Vitamin D Deficiency  Of 34 in [address] with last vitamin D of 102 in Aug 2014. Patient supplements vitamin D without any suspected side-effects.  Medication Sig  . ALPRAZolam (XANAX) 0.5 MG tablet Take 0.5 mg by mouth at bedtime as needed for anxiety.   . Ascorbic Acid (VITAMIN C PO) Take 1 tablet by mouth daily.   Marland Kitchen BIOTIN PO Take 1 tablet by mouth daily.   . Calcium Carbonate-Vitamin D (CALCIUM + D PO) Take 1 tablet by mouth daily.   . Cholecalciferol (VITAMIN D PO) Take 1 tablet by mouth daily.   Marland Kitchen FOLIC ACID PO Take 1 tablet by mouth daily.   . hydrochlorothiazide (HYDRODIURIL) 25 MG tablet Take 25 mg by mouth as needed.  Marland Kitchen MAGNESIUM PO Take 1 tablet by mouth daily.   . Multiple Vitamin (MULTIVITAMIN) tablet Take 1 tablet by mouth daily.  . Omega-3 Fatty Acids (FISH OIL PO) Take 1 capsule by mouth daily.   . Red Yeast Rice Extract (RED YEAST RICE PO) Take 1 tablet by  mouth daily.      Allergies  Allergen Reactions  . Actonel [Risedronate Sodium]   . Boniva [Ibandronic Acid]   . Evista [Raloxifene]   . Fosamax [Alendronate Sodium]   . Levsin [Hyoscyamine Sulfate]   . Mobic [Meloxicam]   . Topamax [Topiramate]   . Zoloft [Sertraline Hcl]    PMHx:   Past Medical History  Diagnosis Date  . Osteoporosis   . DI (detrusor instability)   . Atrophic vaginitis   . Anxiety   . Colon polyps   . H/O echocardiogram     a. Echo 11/2012:EF 50-55%, focal basal septal hypertrophy with some mitral valve SAM (mild) and MR.  This may be a sigmoid septum with advanced age or could be a variant of hypertrophic cardiomyopathy.  Marland Kitchen LBBB (left bundle branch block)   . Depression   . HTN (hypertension)   . Hyperlipidemia   . Chest pain     a. Adenosine sestamibi (1/14) with no evidence of ischemia or infarction, EF 63%.   . Complete heart block 06-2013    status post pacemaker implantation by Dr Johney Frame 06-11-2013  . Migraines   . Prediabetes   . Vitamin D deficiency   . GERD (gastroesophageal reflux disease)     History + Hyplori via EGD   FHx:    Reviewed / unchanged  SHx:    Reviewed / unchanged  Systems Review: Constitutional: Denies fever, chills, wt changes, headaches, insomnia, fatigue, night sweats, change in appetite. Eyes: Denies redness, blurred vision, diplopia, discharge, itchy, watery eyes.  ENT: Denies discharge, congestion, post nasal drip, epistaxis, sore throat, earache, hearing loss, dental pain, tinnitus, vertigo, sinus pain, snoring.  CV: Denies chest pain, palpitations, irregular heartbeat, syncope, dyspnea, diaphoresis, orthopnea, PND, claudication, edema. Respiratory: denies cough, dyspnea, DOE, pleurisy, hoarseness, laryngitis, wheezing.  Gastrointestinal: Denies dysphagia, odynophagia, heartburn, reflux, water brash, abdominal pain or cramps, nausea, vomiting, bloating, diarrhea, constipation, hematemesis, melena, hematochezia,  or  hemorrhoids. Genitourinary: Denies dysuria, frequency, urgency, nocturia, hesitancy, discharge, hematuria, flank pain. Musculoskeletal: Denies arthralgias, myalgias, stiffness, jt. swelling, pain, limp, strain/sprain.  Skin: Denies pruritus, rash, hives, warts, acne, eczema, change in skin lesion(s). Neuro: No weakness, tremor, incoordination, spasms, paresthesia, or pain. Psychiatric: Denies confusion, memory loss, or sensory loss. Endo: Denies change in weight, skin, hair change.  Heme/Lymph: No excessive bleeding, bruising, orenlarged lymph nodes.  Exam:  BP 136/88  Pulse 68  Temp 98.1 F (36.7 C)   Resp 16  Ht 5' 5.25"   Wt 157 lb 12.8 oz   BMI 26.07 kg/m2  Appears well nourished - in no distress. Eyes: PERRLA, EOMs, conjunctiva no swelling or erythema. Sinuses: No frontal/maxillary tenderness ENT/Mouth: EAC's clear, TM's nl w/o erythema, bulging. Nares clear w/o erythema, swelling, exudates. Oropharynx clear without erythema or exudates. Oral hygiene is good. Tongue normal, non obstructing. Hearing intact.  Neck: Supple. Thyroid nl. Car 2+/2+ without bruits, nodes or JVD. Chest: Respirations nl with BS clear & equal w/o rales, rhonchi, wheezing or stridor.  Cor: Heart sounds normal w/ regular rate and rhythm without sig. murmurs, gallops, clicks, or rubs. Peripheral pulses normal and equal  without edema.  Abdomen: Soft & bowel sounds normal. Non-tender w/o guarding, rebound, hernias, masses, or organomegaly.  Musculoskeletal: Full ROM all peripheral extremities, joint stability, 5/5 strength, and normal gait.  Skin: Warm, dry without exposed rashes, lesions, ecchymosis apparent.  Neuro: Cranial nerves intact, reflexes equal bilaterally. Sensory-motor testing grossly intact. Tendon reflexes grossly intact.  Pysch: Alert & oriented x 3. Insight and judgement nl & appropriate. No ideations.  Assessment and Plan:  1. Labile Hypertension - Continue monitor blood pressure at home.  Continue diet same. Add Rx Ziac - 5 for BP and HA's.  2. Hyperlipidemia - Continue diet/meds, exercise,& lifestyle modifications. Continue monitor periodic cholesterol/liver & renal functions   3. Pre-diabetes/Insulin Resistance - Continue diet, exercise, lifestyle modifications. Monitor appropriate labs.  4. Vitamin D Deficiency - Continue supplementation.  Recommended regular exercise, BP monitoring, weight control, and discussed med and SE's. Recommended labs to assess and monitor clinical status. Further disposition pending results of labs.

## 2014-01-23 LAB — VITAMIN D 25 HYDROXY (VIT D DEFICIENCY, FRACTURES): Vit D, 25-Hydroxy: 120 ng/mL — ABNORMAL HIGH (ref 30–89)

## 2014-01-23 LAB — INSULIN, FASTING: Insulin fasting, serum: 7 u[IU]/mL (ref 3–28)

## 2014-02-05 ENCOUNTER — Ambulatory Visit: Payer: Self-pay | Admitting: Internal Medicine

## 2014-02-24 ENCOUNTER — Other Ambulatory Visit: Payer: Self-pay | Admitting: *Deleted

## 2014-02-24 ENCOUNTER — Other Ambulatory Visit: Payer: Self-pay | Admitting: Emergency Medicine

## 2014-02-24 MED ORDER — RANITIDINE HCL 300 MG PO TABS: 300.0000 mg | ORAL_TABLET | Freq: Every day | ORAL | Status: DC

## 2014-02-24 MED ORDER — ALPRAZOLAM 1 MG PO TABS: ORAL_TABLET | ORAL | Status: DC

## 2014-03-10 ENCOUNTER — Other Ambulatory Visit: Payer: Self-pay | Admitting: Internal Medicine

## 2014-03-10 DIAGNOSIS — Z1231 Encounter for screening mammogram for malignant neoplasm of breast: Secondary | ICD-10-CM

## 2014-03-24 ENCOUNTER — Encounter: Payer: Self-pay | Admitting: Internal Medicine

## 2014-03-24 ENCOUNTER — Ambulatory Visit (INDEPENDENT_AMBULATORY_CARE_PROVIDER_SITE_OTHER): Payer: Medicare PPO | Admitting: *Deleted

## 2014-03-24 DIAGNOSIS — I442 Atrioventricular block, complete: Secondary | ICD-10-CM

## 2014-03-24 NOTE — Progress Notes (Signed)
Remote pacemaker transmission.   

## 2014-03-25 LAB — MDC_IDC_ENUM_SESS_TYPE_REMOTE
Battery Remaining Longevity: 121 mo
Battery Remaining Percentage: 95.5 %
Battery Voltage: 2.99 V
Brady Statistic AP VP Percent: 68 %
Brady Statistic AP VS Percent: 1 %
Brady Statistic AS VP Percent: 32 %
Brady Statistic AS VS Percent: 1 %
Brady Statistic RA Percent Paced: 68 %
Date Time Interrogation Session: 20150518060010
Implantable Pulse Generator Model: 2240
Implantable Pulse Generator Serial Number: 7523768
Lead Channel Impedance Value: 580 Ohm
Lead Channel Pacing Threshold Amplitude: 0.5 V
Lead Channel Pacing Threshold Amplitude: 0.75 V
Lead Channel Pacing Threshold Pulse Width: 0.4 ms
Lead Channel Sensing Intrinsic Amplitude: 12 mV
Lead Channel Sensing Intrinsic Amplitude: 5 mV
Lead Channel Setting Pacing Amplitude: 2 V
Lead Channel Setting Sensing Sensitivity: 4 mV
MDC IDC MSMT LEADCHNL RV IMPEDANCE VALUE: 710 Ohm
MDC IDC MSMT LEADCHNL RV PACING THRESHOLD PULSEWIDTH: 0.4 ms
MDC IDC SET LEADCHNL RV PACING AMPLITUDE: 1 V
MDC IDC SET LEADCHNL RV PACING PULSEWIDTH: 0.4 ms
MDC IDC STAT BRADY RV PERCENT PACED: 99 %

## 2014-03-27 ENCOUNTER — Encounter: Payer: Self-pay | Admitting: Cardiology

## 2014-04-07 ENCOUNTER — Other Ambulatory Visit: Payer: Self-pay | Admitting: Internal Medicine

## 2014-04-07 ENCOUNTER — Ambulatory Visit (HOSPITAL_COMMUNITY)
Admission: RE | Admit: 2014-04-07 | Discharge: 2014-04-07 | Disposition: A | Discharge status: Home / Self Care | Payer: Medicare PPO | Source: Ambulatory Visit | Attending: Internal Medicine | Admitting: Internal Medicine

## 2014-04-07 DIAGNOSIS — Z1231 Encounter for screening mammogram for malignant neoplasm of breast: Secondary | ICD-10-CM | POA: Insufficient documentation

## 2014-04-29 ENCOUNTER — Ambulatory Visit (INDEPENDENT_AMBULATORY_CARE_PROVIDER_SITE_OTHER): Payer: Medicare PPO | Admitting: Emergency Medicine

## 2014-04-29 ENCOUNTER — Encounter: Payer: Self-pay | Admitting: Emergency Medicine

## 2014-04-29 VITALS — BP 118/80 | HR 66 | Temp 98.0°F | Resp 16 | Ht 65.0 in | Wt 151.0 lb

## 2014-04-29 DIAGNOSIS — R7309 Other abnormal glucose: Secondary | ICD-10-CM

## 2014-04-29 DIAGNOSIS — R5383 Other fatigue: Secondary | ICD-10-CM

## 2014-04-29 DIAGNOSIS — I10 Essential (primary) hypertension: Secondary | ICD-10-CM

## 2014-04-29 DIAGNOSIS — E782 Mixed hyperlipidemia: Secondary | ICD-10-CM

## 2014-04-29 DIAGNOSIS — E538 Deficiency of other specified B group vitamins: Secondary | ICD-10-CM

## 2014-04-29 DIAGNOSIS — N951 Menopausal and female climacteric states: Secondary | ICD-10-CM

## 2014-04-29 DIAGNOSIS — D649 Anemia, unspecified: Secondary | ICD-10-CM

## 2014-04-29 DIAGNOSIS — R5381 Other malaise: Secondary | ICD-10-CM

## 2014-04-29 DIAGNOSIS — R232 Flushing: Secondary | ICD-10-CM

## 2014-04-29 LAB — HEMOGLOBIN A1C
HEMOGLOBIN A1C: 5.9 % — AB (ref <5.7)
Mean Plasma Glucose: 123 mg/dL — ABNORMAL HIGH (ref <117)

## 2014-04-29 MED ORDER — HYDROCORTISONE 2.5 % RE CREA: 1.0000 "application " | TOPICAL_CREAM | Freq: Two times a day (BID) | RECTAL | Status: DC

## 2014-04-29 NOTE — Progress Notes (Signed)
Subjective:    Patient ID: Alexandra Wheeler, female    DOB: 1941/03/05, 73 y.o.   MRN: [id number]  HPI Comments: 73 yo WF presents for 3 month F/U for HTN, Cholesterol, Pre-Dm, D. Deficient. She has been eating better. She has started exercising 3-4 times weekly. She has noticed more hot flashes since temperatures have increased. She seems more tired than normal. She notes hot flashes have been on/off since pacemaker last 8/14 was installed. WBC             3.8   01/22/2014 HGB            13.0   01/22/2014 HCT            39.7   01/22/2014 PLT             206   01/22/2014 GLUCOSE          90   01/22/2014 CHOL            155   01/22/2014 TRIG             67   01/22/2014 HDL              63   01/22/2014 LDLCALC          79   01/22/2014 ALT              15   01/22/2014 AST              19   01/22/2014 NA              142   01/22/2014 K               4.3   01/22/2014 CL              104   01/22/2014 CREATININE     0.79   01/22/2014 BUN              10   01/22/2014 CO2              29   01/22/2014 TSH           0.743   01/22/2014 INR            0.98   06/10/2013 HGBA1C          5.8   01/22/2014   Hyperlipidemia  Gastrophageal Reflux Associated symptoms include fatigue.     Medication List       This list is accurate as of: 04/29/14 10:00 AM.  Always use your most recent med list.               ALPRAZolam 1 MG tablet  Commonly known as:  XANAX  1/2 to 1 tab TID PRN for anxiety     BIOTIN PO  Take 1 tablet by mouth daily.     CALCIUM + D PO  Take 1 tablet by mouth daily.     citalopram 20 MG tablet  Commonly known as:  CELEXA  Take 20 mg by mouth daily.     FISH OIL PO  Take 1 capsule by mouth daily.     FOLIC ACID PO  Take 1 tablet by mouth daily.     hydrochlorothiazide 25 MG tablet  Commonly known as:  HYDRODIURIL  Take 25 mg by mouth as needed.     MAGNESIUM PO  Take 500 mg by mouth daily.     multivitamin tablet  Take 1 tablet  by mouth daily.     ranitidine 300 MG  tablet  Commonly known as:  ZANTAC  Take 1 tablet (300 mg total) by mouth at bedtime.     RED YEAST RICE PO  Take 1 tablet by mouth daily.     Tretinoin (Emollient) 0.05 % Crea     VITAMIN C PO  Take 1 tablet by mouth daily.     VITAMIN D PO  Take 5,000 Units by mouth daily.       Allergies  Allergen Reactions  . Actonel [Risedronate Sodium]   . Boniva [Ibandronic Acid]   . Evista [Raloxifene]   . Fosamax [Alendronate Sodium]   . Levsin [Hyoscyamine Sulfate]   . Mobic [Meloxicam]   . Topamax [Topiramate]   . Zoloft [Sertraline Hcl]    Past Medical History  Diagnosis Date  . Osteoporosis   . DI (detrusor instability)   . Atrophic vaginitis   . Anxiety   . Colon polyps   . H/O echocardiogram     a. Echo 11/2012:EF 50-55%, focal basal septal hypertrophy with some mitral valve SAM (mild) and MR.  This may be a sigmoid septum with advanced age or could be a variant of hypertrophic cardiomyopathy.  Marland Kitchen LBBB (left bundle branch block)   . Depression   . HTN (hypertension)   . Hyperlipidemia   . Chest pain     a. Adenosine sestamibi (1/14) with no evidence of ischemia or infarction, EF 63%.   . Complete heart block 06-2013    status post pacemaker implantation by Dr Johney Frame 06-11-2013  . Migraines   . Prediabetes   . Vitamin D deficiency   . GERD (gastroesophageal reflux disease)     History + Hyplori via EGD      Review of Systems  Constitutional: Positive for fatigue.  Endocrine: Positive for heat intolerance.  All other systems reviewed and are negative.  BP 118/80  Pulse 66  Temp(Src) 98 F (36.7 C) (Temporal)  Resp 16  Ht 5\' 5"  (1.651 m)  Wt 151 lb (68.493 kg)  BMI 25.13 kg/m2     Objective:   Physical Exam  Nursing note and vitals reviewed. Constitutional: She is oriented to person, place, and time. She appears well-developed and well-nourished. No distress.  HENT:  Head: Normocephalic and atraumatic.  Right Ear: External ear normal.  Left Ear:  External ear normal.  Nose: Nose normal.  Mouth/Throat: Oropharynx is clear and moist.  Eyes: Conjunctivae and EOM are normal.  Neck: Normal range of motion. Neck supple. No JVD present. No thyromegaly present.  Cardiovascular: Normal rate, regular rhythm, normal heart sounds and intact distal pulses.   Pulmonary/Chest: Effort normal and breath sounds normal.  Abdominal: Soft. Bowel sounds are normal. She exhibits no distension and no mass. There is no tenderness. There is no rebound and no guarding.  Genitourinary:  Declined exam  Musculoskeletal: Normal range of motion. She exhibits no edema and no tenderness.  Lymphadenopathy:    She has no cervical adenopathy.  Neurological: She is alert and oriented to person, place, and time. No cranial nerve deficit.  Skin: Skin is warm and dry. No rash noted. No erythema. No pallor.  Psychiatric: She has a normal mood and affect. Her behavior is normal. Judgment and thought content normal.          Assessment & Plan:  1.  3 month F/U for HTN, Cholesterol, Pre-Dm, D. Deficient. Needs healthy diet, cardio QD and obtain healthy weight. Check Labs, Check  BP if >130/80 call office   2. ? Anal itch/ hemorrhoid- Anusol AD- w/c if no change for further evaluation. Patient noted after visit mild anal/ hemorrhoid irritation and wants to try cream before exam.

## 2014-04-30 ENCOUNTER — Telehealth: Payer: Self-pay | Admitting: Hematology and Oncology

## 2014-04-30 ENCOUNTER — Other Ambulatory Visit: Payer: Self-pay | Admitting: Emergency Medicine

## 2014-04-30 DIAGNOSIS — R6889 Other general symptoms and signs: Secondary | ICD-10-CM

## 2014-04-30 LAB — BASIC METABOLIC PANEL WITH GFR
BUN: 7 mg/dL (ref 6–23)
CO2: 24 meq/L (ref 19–32)
Calcium: 9 mg/dL (ref 8.4–10.5)
Chloride: 108 mEq/L (ref 96–112)
Creat: 0.75 mg/dL (ref 0.50–1.10)
GFR, Est Non African American: 79 mL/min
GLUCOSE: 99 mg/dL (ref 70–99)
Potassium: 4 mEq/L (ref 3.5–5.3)
SODIUM: 141 meq/L (ref 135–145)

## 2014-04-30 LAB — CBC WITH DIFFERENTIAL/PLATELET
Basophils Absolute: 0 10*3/uL (ref 0.0–0.1)
Basophils Relative: 2 % — ABNORMAL HIGH (ref 0–1)
Eosinophils Absolute: 0.1 10*3/uL (ref 0.0–0.7)
Eosinophils Relative: 4 % (ref 0–5)
HEMATOCRIT: 37.6 % (ref 36.0–46.0)
HEMOGLOBIN: 12.5 g/dL (ref 12.0–15.0)
LYMPHS ABS: 1.2 10*3/uL (ref 0.7–4.0)
Lymphocytes Relative: 51 % — ABNORMAL HIGH (ref 12–46)
MCH: 28 pg (ref 26.0–34.0)
MCHC: 33.2 g/dL (ref 30.0–36.0)
MCV: 84.3 fL (ref 78.0–100.0)
MONO ABS: 0.2 10*3/uL (ref 0.1–1.0)
MONOS PCT: 8 % (ref 3–12)
NEUTROS PCT: 35 % — AB (ref 43–77)
Neutro Abs: 0.8 10*3/uL — ABNORMAL LOW (ref 1.7–7.7)
Platelets: 194 10*3/uL (ref 150–400)
RBC: 4.46 MIL/uL (ref 3.87–5.11)
RDW: 14.5 % (ref 11.5–15.5)
WBC: 2.4 10*3/uL — ABNORMAL LOW (ref 4.0–10.5)

## 2014-04-30 LAB — HEPATIC FUNCTION PANEL
ALBUMIN: 3.9 g/dL (ref 3.5–5.2)
ALK PHOS: 70 U/L (ref 39–117)
ALT: 28 U/L (ref 0–35)
AST: 25 U/L (ref 0–37)
Bilirubin, Direct: 0.1 mg/dL (ref 0.0–0.3)
Indirect Bilirubin: 0.2 mg/dL (ref 0.2–1.2)
TOTAL PROTEIN: 6.3 g/dL (ref 6.0–8.3)
Total Bilirubin: 0.3 mg/dL (ref 0.2–1.2)

## 2014-04-30 LAB — TSH: TSH: 0.59 u[IU]/mL (ref 0.350–4.500)

## 2014-04-30 LAB — IRON AND TIBC
%SAT: 18 % — AB (ref 20–55)
Iron: 54 ug/dL (ref 42–145)
TIBC: 305 ug/dL (ref 250–470)
UIBC: 251 ug/dL (ref 125–400)

## 2014-04-30 LAB — LIPID PANEL
CHOL/HDL RATIO: 2.8 ratio
CHOLESTEROL: 148 mg/dL (ref 0–200)
HDL: 53 mg/dL (ref >39)
LDL Cholesterol: 85 mg/dL (ref 0–99)
Triglycerides: 52 mg/dL (ref <150)
VLDL: 10 mg/dL (ref 0–40)

## 2014-04-30 LAB — VITAMIN B12: VITAMIN B 12: 385 pg/mL (ref 211–911)

## 2014-04-30 LAB — INSULIN, FASTING: Insulin fasting, serum: 12 u[IU]/mL (ref 3–28)

## 2014-04-30 LAB — FOLATE RBC: RBC Folate: 342 ng/mL (ref >280)

## 2014-04-30 LAB — CORTISOL: Cortisol, Plasma: 12.9 ug/dL

## 2014-04-30 NOTE — Telephone Encounter (Signed)
LEFT MESSAGE FOR PATIENT TO RETURN CALL TO SCHEDULE NP APPT.  °

## 2014-05-19 ENCOUNTER — Ambulatory Visit (HOSPITAL_BASED_OUTPATIENT_CLINIC_OR_DEPARTMENT_OTHER): Payer: Medicare PPO | Admitting: Hematology and Oncology

## 2014-05-19 ENCOUNTER — Encounter: Payer: Self-pay | Admitting: Hematology and Oncology

## 2014-05-19 ENCOUNTER — Ambulatory Visit: Payer: Medicare PPO

## 2014-05-19 ENCOUNTER — Telehealth: Payer: Self-pay | Admitting: Hematology and Oncology

## 2014-05-19 VITALS — BP 146/83 | HR 73 | Temp 98.2°F | Resp 18 | Ht 65.0 in | Wt 148.1 lb

## 2014-05-19 DIAGNOSIS — D72820 Lymphocytosis (symptomatic): Secondary | ICD-10-CM | POA: Insufficient documentation

## 2014-05-19 DIAGNOSIS — D72819 Decreased white blood cell count, unspecified: Secondary | ICD-10-CM

## 2014-05-19 HISTORY — DX: Decreased white blood cell count, unspecified: D72.819

## 2014-05-19 HISTORY — DX: Lymphocytosis (symptomatic): D72.820

## 2014-05-19 NOTE — Progress Notes (Signed)
[place] NOTE  Patient Care Team: Unk Pinto, MD as PCP - General (Internal Medicine) Unk Pinto, MD as Attending Physician (Internal Medicine) Larey Dresser, MD as Consulting Physician (Cardiology) Edison Nasuti, MD as Consulting Physician (Neurology)  CHIEF COMPLAINTS/PURPOSE OF CONSULTATION:  Leukopenia  HISTORY OF PRESENTING ILLNESS:  Alexandra Wheeler 73 y.o. female is here because of thrombocytopenia.  She was found to have abnormal CBC from routine blood work done at her physician's office. 3 months ago, her white blood cell count was low at 3.8 and recently at 2.4. She denies recent infection. She cannot remember when she was prescribed antibiotics. She denies new lymphadenopathy. Denies new medication. She denies prior blood or platelet transfusions   MEDICAL HISTORY:  Past Medical History  Diagnosis Date   Osteoporosis    DI (detrusor instability)    Atrophic vaginitis    Anxiety    Colon polyps    H/O echocardiogram     a. Echo 11/2012:EF 50-55%, focal basal septal hypertrophy with some mitral valve SAM (mild) and MR.  This may be a sigmoid septum with advanced age or could be a variant of hypertrophic cardiomyopathy.   LBBB (left bundle branch block)    Depression    HTN (hypertension)    Hyperlipidemia    Chest pain     a. Adenosine sestamibi (1/14) with no evidence of ischemia or infarction, EF 63%.    Complete heart block 06-2013    status post pacemaker implantation by Dr Rayann Heman 06-11-2013   Migraines    Prediabetes    Vitamin D deficiency    GERD (gastroesophageal reflux disease)     History + Hyplori via EGD   Leukopenia 05/19/2014   Lymphocytosis 05/19/2014    SURGICAL HISTORY: Past Surgical History  Procedure Laterality Date   Breast surgery      Breast cyst   Tubal ligation     Hemorrhoid surgery     Pacemaker insertion  06-11-2013    STJ dual chamber pacemaker implanted by Dr Rayann Heman for  complete heart block    SOCIAL HISTORY: History   Social History   Marital Status: Widowed    Spouse Name: N/A    Number of Children: N/A   Years of Education: N/A   Occupational History   Part-time at [employer] Topics   Smoking status: Former Smoker    Types: Cigarettes    Quit date: 10/09/1975   Smokeless tobacco: Never Used   Alcohol Use: No   Drug Use: No   Sexual Activity: No   Other Topics Concern   Not on file   Social History Narrative   Lives alone. Dtr in the area.    FAMILY HISTORY: Family History  Problem Relation Age of Onset   Diabetes Mother    Heart disease Mother    Diabetes Brother    Hypertension Brother    Heart disease Brother    Breast cancer Sister 19   Aneurysm Brother    Stroke Brother    Cancer Father     prostate   Diabetes Brother    Heart disease Brother     ALLERGIES:  is allergic to actonel; boniva; evista; fosamax; levsin; mobic; topamax; and zoloft.  MEDICATIONS:  Current Outpatient Prescriptions  Medication Sig Dispense Refill   ALPRAZolam (XANAX) 1 MG tablet 1/2 to 1 tab TID PRN for anxiety  270 tablet  1   Ascorbic Acid (VITAMIN C PO) Take  1 tablet by mouth daily.        aspirin EC 81 MG tablet Take 81 mg by mouth daily.       BIOTIN PO Take 1 tablet by mouth daily.        Calcium Carbonate-Vitamin D (CALCIUM + D PO) Take 1 tablet by mouth daily.        Cholecalciferol (VITAMIN D PO) Take 5,000 Units by mouth daily.        citalopram (CELEXA) 20 MG tablet Take 20 mg by mouth daily.       FOLIC ACID PO Take 1 tablet by mouth daily.        hydrochlorothiazide (HYDRODIURIL) 25 MG tablet Take 25 mg by mouth as needed.       hydrocortisone (ANUSOL-HC) 2.5 % rectal cream Place 1 application rectally 2 (two) times daily.  30 g  0   MAGNESIUM PO Take 500 mg by mouth daily.        Multiple Vitamin (MULTIVITAMIN) tablet Take 1 tablet by mouth daily.       Omega-3 Fatty Acids  (FISH OIL PO) Take 1 capsule by mouth daily.        ranitidine (ZANTAC) 300 MG tablet Take 1 tablet (300 mg total) by mouth at bedtime.  90 tablet  1   Red Yeast Rice Extract (RED YEAST RICE PO) Take 1 tablet by mouth daily.        Tretinoin, Facial Wrinkles, (TRETINOIN, EMOLLIENT,) 0.05 % CREA        No current facility-administered medications for this visit.    REVIEW OF SYSTEMS:   Constitutional: Denies fevers, chills or abnormal night sweats Eyes: Denies blurriness of vision, double vision or watery eyes Ears, nose, mouth, throat, and face: Denies mucositis or sore throat Respiratory: Denies cough, dyspnea or wheezes Cardiovascular: Denies palpitation, chest discomfort or lower extremity swelling Gastrointestinal:  Denies nausea, heartburn or change in bowel habits Skin: Denies abnormal skin rashes Lymphatics: Denies new lymphadenopathy or easy bruising Neurological:Denies numbness, tingling or new weaknesses Behavioral/Psych: Mood is stable, no new changes  All other systems were reviewed with the patient and are negative.  PHYSICAL EXAMINATION: ECOG PERFORMANCE STATUS: 0 - Asymptomatic  Filed Vitals:   05/19/14 1007  BP: 146/83  Pulse: 73  Temp: 98.2 F (36.8 C)  Resp: 18   Filed Weights   05/19/14 1007  Weight: 148 lb 1.6 oz (67.178 kg)    GENERAL:alert, no distress and comfortable SKIN: skin color, texture, turgor are normal, no rashes or significant lesions EYES: normal, conjunctiva are pink and non-injected, sclera clear OROPHARYNX:no exudate, no erythema and lips, buccal mucosa, and tongue normal  NECK: supple, thyroid normal size, non-tender, without nodularity LYMPH:  no palpable lymphadenopathy in the cervical, axillary or inguinal LUNGS: clear to auscultation and percussion with normal breathing effort HEART: regular rate & rhythm and no murmurs and no lower extremity edema ABDOMEN:abdomen soft, non-tender and normal bowel sounds Musculoskeletal:no  cyanosis of digits and no clubbing  PSYCH: alert & oriented x 3 with fluent speech NEURO: no focal motor/sensory deficits  LABORATORY DATA:  I have reviewed the data as listed Recent Results (from the past 2160 hour(s))  [id number]     Status: None   Collection Time    03/25/14  9:20 AM      Result Value Ref Range   Date Time Interrogation Session [id number]     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     Pulse Gen Model 2240 Assurity  DR     Pulse Gen Serial Number [id number]     RV Sense Sensitivity 4     RV Adaptation Mode Fixed Pacing     RA Pace Amplitude 2     RV Pace PulseWidth 0.4     RV Pace Amplitude 1     Lead Channel Status      RA Impedance 580     RA Amplitude 5     RA Pacing Amplitude 0.5     RA Pacing PulseWidth 0.4     Lead Channel Status      RV IMPEDANCE 710     RV Amplitude 12     RV Pacing Amplitude 0.75     RV Pacing PulseWidth 0.4     Battery Status MOS     Battery Longevity 121     Battery Percent 95.5     Battery Voltage 2.99     Brady RA Perc Paced 68     Brady RV Perc Paced 99     Brady AP VP Percent 68     Brady AS VP Percent 32     Brady AP VS Percent 1     Brady AS VS Percent 1     Eval Rhythm Ap-Vp     Miscellaneous Comment       Value: Pacemaker remote check. Device function reviewed. Impedance, sensing, auto capture thresholds consistent with previous measurements. Histograms appropriate for patient and level of activity. All other diagnostic data reviewed and is appropriate and      stable for patient. Real time/magnet EGM shows appropriate sensing and capture. 25 mode switches, <1%, PAC's and atrial tachy.  No ventricular high rate episodes.  PMT noted on previous transmission.   Estimated longevity 9.8 years. Plan to follow in 3      months remotely, to see in office annually.  ROV 06/26/14 @ 9:15am with Dr. Rayann Heman.  HEPATIC FUNCTION PANEL     Status: None   Collection Time    04/29/14 10:17 AM       Result Value Ref Range   Total Bilirubin 0.3  0.2 - 1.2 mg/dL   Bilirubin, Direct 0.1  0.0 - 0.3 mg/dL   Indirect Bilirubin 0.2  0.2 - 1.2 mg/dL   Alkaline Phosphatase 70  39 - 117 U/L   AST 25  0 - 37 U/L   ALT 28  0 - 35 U/L   Total Protein 6.3  6.0 - 8.3 g/dL   Albumin 3.9  3.5 - 5.2 g/dL  LIPID PANEL     Status: None   Collection Time    04/29/14 10:17 AM      Result Value Ref Range   Cholesterol 148  0 - 200 mg/dL   Comment: ATP III Classification:           < 200        mg/dL        Desirable          200 - 239     mg/dL        Borderline High          >= 240        mg/dL        High         Triglycerides 52  <150 mg/dL   HDL 53  >39 mg/dL   Total CHOL/HDL Ratio 2.8     VLDL 10  0 - 40 mg/dL   LDL Cholesterol  85  0 - 99 mg/dL   Comment:       Total Cholesterol/HDL Ratio:CHD Risk                            Coronary Heart Disease Risk Table                                            Men       Women              1/2 Average Risk              3.4        3.3                  Average Risk              5.0        4.4               2X Average Risk              9.6        7.1               3X Average Risk             23.4       11.0     Use the calculated Patient Ratio above and the CHD Risk table      to determine the patient's CHD Risk.     ATP III Classification (LDL):           < 100        mg/dL         Optimal          100 - 129     mg/dL         Near or Above Optimal          130 - 159     mg/dL         Borderline High          160 - 189     mg/dL         High           > 190        mg/dL         Very High        BASIC METABOLIC PANEL WITH GFR     Status: None   Collection Time    04/29/14 10:17 AM      Result Value Ref Range   Sodium 141  135 - 145 mEq/L   Potassium 4.0  3.5 - 5.3 mEq/L   Chloride 108  96 - 112 mEq/L   CO2 24  19 - 32 mEq/L   Glucose, Bld 99  70 - 99 mg/dL   BUN 7  6 - 23 mg/dL   Creat 0.75  0.50 - 1.10 mg/dL   Calcium 9.0  8.4 - 10.5 mg/dL    GFR, Est African American >89     GFR, Est Non African American 79     Comment:       The estimated GFR is a calculation valid for adults (>=2 years old)     that uses the CKD-EPI algorithm to adjust for age and sex. It is  not to be used for children, pregnant women, hospitalized patients,        patients on dialysis, or with rapidly changing kidney function.     According to the NKDEP, eGFR >89 is normal, 60-89 shows mild     impairment, 30-59 shows moderate impairment, 15-29 shows severe     impairment and <15 is ESRD.        INSULIN, FASTING     Status: None   Collection Time    04/29/14 10:17 AM      Result Value Ref Range   Insulin fasting, serum 12  3 - 28 uIU/mL  HEMOGLOBIN A1C     Status: Abnormal   Collection Time    04/29/14 10:17 AM      Result Value Ref Range   Hemoglobin A1C 5.9 (*) <5.7 %   Comment:                                                                            According to the ADA Clinical Practice Recommendations for 2011, when     HbA1c is used as a screening test:             >=6.5%   Diagnostic of Diabetes Mellitus                (if abnormal result is confirmed)           5.7-6.4%   Increased risk of developing Diabetes Mellitus           References:Diagnosis and Classification of Diabetes Mellitus,Diabetes     IZTI,4580,99(IPJAS 1):S62-S69 and Standards of Medical Care in             Diabetes - 2011,Diabetes Care,2011,34 (Suppl 1):S11-S61.         Mean Plasma Glucose 123 (*) <117 mg/dL  CBC WITH DIFFERENTIAL     Status: Abnormal   Collection Time    04/29/14 10:17 AM      Result Value Ref Range   WBC 2.4 (*) 4.0 - 10.5 K/uL   RBC 4.46  3.87 - 5.11 MIL/uL   Hemoglobin 12.5  12.0 - 15.0 g/dL   HCT 37.6  36.0 - 46.0 %   MCV 84.3  78.0 - 100.0 fL   MCH 28.0  26.0 - 34.0 pg   MCHC 33.2  30.0 - 36.0 g/dL   RDW 14.5  11.5 - 15.5 %   Platelets 194  150 - 400 K/uL   Neutrophils Relative % 35 (*) 43 - 77 %   Neutro Abs 0.8 (*) 1.7 - 7.7  K/uL   Lymphocytes Relative 51 (*) 12 - 46 %   Lymphs Abs 1.2  0.7 - 4.0 K/uL   Monocytes Relative 8  3 - 12 %   Monocytes Absolute 0.2  0.1 - 1.0 K/uL   Eosinophils Relative 4  0 - 5 %   Eosinophils Absolute 0.1  0.0 - 0.7 K/uL   Basophils Relative 2 (*) 0 - 1 %   Basophils Absolute 0.0  0.0 - 0.1 K/uL   Smear Review SEE NOTE     Comment:       RBC, Platelet and WBC Morphology unremarkable  TSH  Status: None   Collection Time    04/29/14 10:17 AM      Result Value Ref Range   TSH 0.590  0.350 - 4.500 uIU/mL  VITAMIN B12     Status: None   Collection Time    04/29/14 10:17 AM      Result Value Ref Range   Vitamin B-12 385  211 - 911 pg/mL  IRON AND TIBC     Status: Abnormal   Collection Time    04/29/14 10:17 AM      Result Value Ref Range   Iron 54  42 - 145 ug/dL   UIBC 251  125 - 400 ug/dL   TIBC 305  250 - 470 ug/dL   %SAT 18 (*) 20 - 55 %  FOLATE RBC     Status: None   Collection Time    04/29/14 10:17 AM      Result Value Ref Range   RBC Folate 342  >280 ng/mL   Comment: Reference range not established for pediatric patients.  CORTISOL     Status: None   Collection Time    04/29/14 10:17 AM      Result Value Ref Range   Cortisol, Plasma 12.9     Comment:       AM:  4.3 - 22.4 ug/dL     PM:  3.1 - 16.7 ug/dL   ASSESSMENT & PLAN Leukopenia Cause is unknown. The patient denies recent history of fevers, cough, chills, diarrhea or dysuria. She is asymptomatic from the leukopenia. I will observe for now.  I will order additional work up for this

## 2014-05-19 NOTE — Assessment & Plan Note (Signed)
Cause is unknown. The patient denies recent history of fevers, cough, chills, diarrhea or dysuria. She is asymptomatic from the leukopenia. I will observe for now.  I will order additional work up for this

## 2014-05-19 NOTE — Telephone Encounter (Signed)
gv adn printed appt sched and avs for pt for July  °

## 2014-05-19 NOTE — Progress Notes (Signed)
Checked in new pt with no financial concerns. °

## 2014-05-20 ENCOUNTER — Other Ambulatory Visit (HOSPITAL_BASED_OUTPATIENT_CLINIC_OR_DEPARTMENT_OTHER): Payer: Medicare PPO

## 2014-05-20 ENCOUNTER — Other Ambulatory Visit: Payer: Medicare PPO

## 2014-05-20 ENCOUNTER — Other Ambulatory Visit (HOSPITAL_COMMUNITY)
Admission: RE | Admit: 2014-05-20 | Discharge: 2014-05-20 | Disposition: A | Discharge status: Home / Self Care | Payer: Medicare PPO | Source: Ambulatory Visit | Attending: Hematology and Oncology | Admitting: Hematology and Oncology

## 2014-05-20 DIAGNOSIS — D72819 Decreased white blood cell count, unspecified: Secondary | ICD-10-CM

## 2014-05-20 LAB — CBC WITH DIFFERENTIAL/PLATELET
BASO%: 1.4 % (ref 0.0–2.0)
Basophils Absolute: 0 10*3/uL (ref 0.0–0.1)
EOS%: 3.1 % (ref 0.0–7.0)
Eosinophils Absolute: 0.1 10*3/uL (ref 0.0–0.5)
HEMATOCRIT: 39.3 % (ref 34.8–46.6)
HGB: 12.4 g/dL (ref 11.6–15.9)
LYMPH%: 56.7 % — AB (ref 14.0–49.7)
MCH: 27.7 pg (ref 25.1–34.0)
MCHC: 31.6 g/dL (ref 31.5–36.0)
MCV: 87.7 fL (ref 79.5–101.0)
MONO#: 0.2 10*3/uL (ref 0.1–0.9)
MONO%: 7.3 % (ref 0.0–14.0)
NEUT#: 0.9 10*3/uL — ABNORMAL LOW (ref 1.5–6.5)
NEUT%: 31.5 % — AB (ref 38.4–76.8)
Platelets: 185 10*3/uL (ref 145–400)
RBC: 4.48 10*6/uL (ref 3.70–5.45)
RDW: 13.9 % (ref 11.2–14.5)
WBC: 2.9 10*3/uL — ABNORMAL LOW (ref 3.9–10.3)
lymph#: 1.6 10*3/uL (ref 0.9–3.3)

## 2014-05-20 LAB — MORPHOLOGY: PLT EST: ADEQUATE

## 2014-05-20 LAB — CHCC SMEAR

## 2014-05-21 ENCOUNTER — Telehealth: Payer: Self-pay | Admitting: *Deleted

## 2014-05-21 LAB — SEDIMENTATION RATE: Sed Rate: 4 mm/hr (ref 0–22)

## 2014-05-21 LAB — ANA: Anti Nuclear Antibody(ANA): NEGATIVE

## 2014-05-21 LAB — FLOW CYTOMETRY

## 2014-05-21 NOTE — Telephone Encounter (Signed)
Should not be a problem

## 2014-05-21 NOTE — Telephone Encounter (Signed)
Pt has a Mission Trip to [place] planned for August 26 th.  Has to pay for the remainder of her trip soon and asks if Dr. Bertis RuddyGorsuch thinks it will be ok to travel?  States "no shots" required for her travel.

## 2014-05-21 NOTE — Telephone Encounter (Signed)
Informed pt it should not be a problem to travel per Dr. Bertis RuddyGorsuch.  She verbalized understanding.

## 2014-06-02 ENCOUNTER — Ambulatory Visit (HOSPITAL_BASED_OUTPATIENT_CLINIC_OR_DEPARTMENT_OTHER): Payer: Medicare PPO | Admitting: Hematology and Oncology

## 2014-06-02 ENCOUNTER — Encounter: Payer: Self-pay | Admitting: Hematology and Oncology

## 2014-06-02 ENCOUNTER — Telehealth: Payer: Self-pay | Admitting: Hematology and Oncology

## 2014-06-02 VITALS — BP 135/96 | HR 84 | Temp 98.8°F | Resp 20 | Ht 65.0 in | Wt 148.9 lb

## 2014-06-02 DIAGNOSIS — D72819 Decreased white blood cell count, unspecified: Secondary | ICD-10-CM

## 2014-06-02 NOTE — Assessment & Plan Note (Signed)
Cause is unknown. The patient denies recent history of fevers, cough, chills, diarrhea or dysuria. She is asymptomatic from the leukopenia. I will observe for now.  So far, workup for autoimmune disorder is negative. Peripheral smear is unremarkable and the white blood cell which could be reactive. I will see her back in 3 months repeat blood work history and physical examination. I do not see any indication for bone marrow aspirate and biopsy for now.

## 2014-06-02 NOTE — Telephone Encounter (Signed)
gv adn printed appt sched and avs for pt for OCT °

## 2014-06-02 NOTE — Progress Notes (Signed)
[facility] OFFICE PROGRESS NOTE  [name],[name], MD SUMMARY OF HEMATOLOGIC HISTORY: She was found to have abnormal CBC from routine blood work done at her physician's office. 3 months ago, her white blood cell count was low at 3.8 and recently at 2.4. She denies recent infection. She cannot remember when she was prescribed antibiotics. She denies new lymphadenopathy. Denies new medication. She denies prior blood or platelet transfusions INTERVAL HISTORY: Zada Findersudrey F Servantes 73 y.o. female returns for further followup. She feels well.  I have reviewed the past medical history, past surgical history, social history and family history with the patient and they are unchanged from previous note.  ALLERGIES:  is allergic to actonel; boniva; evista; fosamax; levsin; mobic; topamax; and zoloft.  MEDICATIONS:  Current Outpatient Prescriptions  Medication Sig Dispense Refill  . ALPRAZolam (XANAX) 1 MG tablet 1/2 to 1 tab TID PRN for anxiety  270 tablet  1  . Ascorbic Acid (VITAMIN C PO) Take 1 tablet by mouth daily.       Marland Kitchen. aspirin EC 81 MG tablet Take 81 mg by mouth daily.      Marland Kitchen. BIOTIN PO Take 1 tablet by mouth daily.       . Calcium Carbonate-Vitamin D (CALCIUM + D PO) Take 1 tablet by mouth daily.       . Cholecalciferol (VITAMIN D PO) Take 5,000 Units by mouth daily.       . citalopram (CELEXA) 20 MG tablet Take 20 mg by mouth daily.      Marland Kitchen. FOLIC ACID PO Take 1 tablet by mouth daily.       . hydrocortisone (ANUSOL-HC) 2.5 % rectal cream Place 1 application rectally 2 (two) times daily.  30 g  0  . MAGNESIUM PO Take 500 mg by mouth daily.       . Multiple Vitamin (MULTIVITAMIN) tablet Take 1 tablet by mouth daily.      . Omega-3 Fatty Acids (FISH OIL PO) Take 1 capsule by mouth daily.       . ranitidine (ZANTAC) 300 MG tablet Take 1 tablet (300 mg total) by mouth at bedtime.  90 tablet  1  . Red Yeast Rice Extract (RED YEAST RICE PO) Take 1 tablet by mouth daily.        . Tretinoin, Facial Wrinkles, (TRETINOIN, EMOLLIENT,) 0.05 % CREA       . hydrochlorothiazide (HYDRODIURIL) 25 MG tablet Take 25 mg by mouth as needed.       No current facility-administered medications for this visit.     REVIEW OF SYSTEMS:   Constitutional: Denies fevers, chills or night sweats Eyes: Denies blurriness of vision Ears, nose, mouth, throat, and face: Denies mucositis or sore throat Respiratory: Denies cough, dyspnea or wheezes Cardiovascular: Denies palpitation, chest discomfort or lower extremity swelling Gastrointestinal:  Denies nausea, heartburn or change in bowel habits Skin: Denies abnormal skin rashes Lymphatics: Denies new lymphadenopathy or easy bruising Neurological:Denies numbness, tingling or new weaknesses Behavioral/Psych: Mood is stable, no new changes  All other systems were reviewed with the patient and are negative.  PHYSICAL EXAMINATION: ECOG PERFORMANCE STATUS: 0 - Asymptomatic  Filed Vitals:   06/02/14 0834  BP: 135/96  Pulse: 84  Temp: 98.8 F (37.1 C)  Resp: 20   Filed Weights   06/02/14 0834  Weight: 148 lb 14.4 oz (67.541 kg)    GENERAL:alert, no distress and comfortable Musculoskeletal:no cyanosis of digits and no clubbing  NEURO: alert & oriented x 3  with fluent speech, no focal motor/sensory deficits  LABORATORY DATA:  I have reviewed the data as listed No results found for this or any previous visit (from the past 48 hour(s)).  Lab Results  Component Value Date   WBC 2.9* 05/20/2014   HGB 12.4 05/20/2014   HCT 39.3 05/20/2014   MCV 87.7 05/20/2014   PLT 185 05/20/2014    ASSESSMENT & PLAN:  Leukopenia Cause is unknown. The patient denies recent history of fevers, cough, chills, diarrhea or dysuria. She is asymptomatic from the leukopenia. I will observe for now.  So far, workup for autoimmune disorder is negative. Peripheral smear is unremarkable and the white blood cell which could be reactive. I will see her back in 3  months repeat blood work history and physical examination. I do not see any indication for bone marrow aspirate and biopsy for now.      All questions were answered. The patient knows to call the clinic with any problems, questions or concerns. No barriers to learning was detected.  I spent 15 minutes counseling the patient face to face. The total time spent in the appointment was 20 minutes and more than 50% was on counseling.     Ketina Mars, MD 06/02/2014 10:15 AM

## 2014-06-04 ENCOUNTER — Other Ambulatory Visit: Payer: Self-pay | Admitting: Internal Medicine

## 2014-06-18 ENCOUNTER — Encounter: Payer: Self-pay | Admitting: Internal Medicine

## 2014-06-26 ENCOUNTER — Encounter: Payer: Self-pay | Admitting: Internal Medicine

## 2014-06-26 ENCOUNTER — Ambulatory Visit (INDEPENDENT_AMBULATORY_CARE_PROVIDER_SITE_OTHER): Payer: Medicare PPO | Admitting: Internal Medicine

## 2014-06-26 VITALS — BP 128/86 | HR 65 | Ht 65.0 in | Wt 148.1 lb

## 2014-06-26 DIAGNOSIS — I442 Atrioventricular block, complete: Secondary | ICD-10-CM

## 2014-06-26 DIAGNOSIS — Z950 Presence of cardiac pacemaker: Secondary | ICD-10-CM

## 2014-06-26 DIAGNOSIS — I10 Essential (primary) hypertension: Secondary | ICD-10-CM

## 2014-06-26 LAB — MDC_IDC_ENUM_SESS_TYPE_INCLINIC
Brady Statistic RA Percent Paced: 69 %
Brady Statistic RV Percent Paced: 99.93 %
Date Time Interrogation Session: 20150820132739
Implantable Pulse Generator Model: 2240
Implantable Pulse Generator Serial Number: 7523768
Lead Channel Impedance Value: 662.5 Ohm
Lead Channel Pacing Threshold Amplitude: 0.875 V
Lead Channel Pacing Threshold Pulse Width: 0.4 ms
Lead Channel Pacing Threshold Pulse Width: 0.4 ms
Lead Channel Sensing Intrinsic Amplitude: 12 mV
Lead Channel Setting Pacing Amplitude: 1.125
Lead Channel Setting Pacing Pulse Width: 0.4 ms
Lead Channel Setting Sensing Sensitivity: 4 mV
MDC IDC MSMT BATTERY REMAINING LONGEVITY: 115.2 mo
MDC IDC MSMT BATTERY VOLTAGE: 2.99 V
MDC IDC MSMT LEADCHNL RA IMPEDANCE VALUE: 550 Ohm
MDC IDC MSMT LEADCHNL RA PACING THRESHOLD AMPLITUDE: 0.5 V
MDC IDC MSMT LEADCHNL RA PACING THRESHOLD AMPLITUDE: 0.5 V
MDC IDC MSMT LEADCHNL RA SENSING INTR AMPL: 2.4 mV
MDC IDC MSMT LEADCHNL RV PACING THRESHOLD PULSEWIDTH: 0.4 ms
MDC IDC SET LEADCHNL RA PACING AMPLITUDE: 2 V

## 2014-06-26 NOTE — Progress Notes (Signed)
PCP: Nadean Corwin, MD Primary Cardiologist:  Dr Suszanne Conners is a 73 y.o. female who presents today for routine electrophysiology followup.  Since her last visit, the patient reports doing very well.   Today, she denies symptoms of palpitations, chest pain, shortness of breath,  lower extremity edema,  or syncope.  The patient is otherwise without complaint today.   Past Medical History  Diagnosis Date  . Osteoporosis   . DI (detrusor instability)   . Atrophic vaginitis   . Anxiety   . Colon polyps   . H/O echocardiogram     a. Echo 11/2012:EF 50-55%, focal basal septal hypertrophy with some mitral valve SAM (mild) and MR.  This may be a sigmoid septum with advanced age or could be a variant of hypertrophic cardiomyopathy.  Marland Kitchen LBBB (left bundle branch block)   . Depression   . HTN (hypertension)   . Hyperlipidemia   . Chest pain     a. Adenosine sestamibi (1/14) with no evidence of ischemia or infarction, EF 63%.   . Complete heart block 06-2013    status post pacemaker implantation by Dr Johney Frame 06-11-2013  . Migraines   . Prediabetes   . Vitamin D deficiency   . GERD (gastroesophageal reflux disease)     History + Hyplori via EGD  . Leukopenia 05/19/2014  . Lymphocytosis 05/19/2014   Past Surgical History  Procedure Laterality Date  . Breast surgery      Breast cyst  . Tubal ligation    . Hemorrhoid surgery    . Pacemaker insertion  06-11-2013    STJ dual chamber pacemaker implanted by Dr Johney Frame for complete heart block    Current Outpatient Prescriptions  Medication Sig Dispense Refill  . ALPRAZolam (XANAX) 1 MG tablet 1/2 to 1 tab TID PRN for anxiety  270 tablet  1  . Ascorbic Acid (VITAMIN C PO) Take 1 tablet by mouth daily.       Marland Kitchen aspirin EC 81 MG tablet Take 81 mg by mouth daily.      Marland Kitchen BIOTIN PO Take 1 tablet by mouth daily.       . Calcium Carbonate-Vitamin D (CALCIUM + D PO) Take 1 tablet by mouth daily.       . Cholecalciferol (VITAMIN D PO) Take  5,000 Units by mouth daily.       . citalopram (CELEXA) 20 MG tablet Take 20 mg by mouth daily.      Marland Kitchen FOLIC ACID PO Take 1 tablet by mouth daily.       . hydrochlorothiazide (HYDRODIURIL) 25 MG tablet Take 25 mg by mouth as needed.      . hydrocortisone (ANUSOL-HC) 2.5 % rectal cream Place 1 application rectally 2 (two) times daily.  30 g  0  . MAGNESIUM PO Take 500 mg by mouth daily.       . Multiple Vitamin (MULTIVITAMIN) tablet Take 1 tablet by mouth daily.      . Omega-3 Fatty Acids (FISH OIL PO) Take 1 capsule by mouth daily.       . Red Yeast Rice Extract (RED YEAST RICE PO) Take 1 tablet by mouth daily.       . Tretinoin, Facial Wrinkles, (TRETINOIN, EMOLLIENT,) 0.05 % CREA APPLY TO AFFECTED AREA DAILY AS DIRECTED  40 g  2  . ranitidine (ZANTAC) 300 MG tablet Take 1 tablet (300 mg total) by mouth at bedtime.  90 tablet  1   No current facility-administered medications  for this visit.    Physical Exam: Filed Vitals:   06/26/14 0927  BP: 128/86  Pulse: 65  Height: 5\' 5"  (1.651 m)  Weight: 148 lb 1.9 oz (67.187 kg)    GEN- The patient is well appearing, alert and oriented x 3 today.   Head- normocephalic, atraumatic Eyes-  Sclera clear, conjunctiva pink Ears- hearing intact Oropharynx- clear Lungs- Clear to ausculation bilaterally, normal work of breathing Chest- pacemaker pocket is well healed Heart- Regular rate and rhythm, no murmurs, rubs or gallops, PMI not laterally displaced GI- soft, NT, ND, + BS Extremities- no clubbing, cyanosis, or edema  Pacemaker interrogation- reviewed in detail today,  See PACEART report  Assessment and Plan:  1. Complete heart block Normal pacemaker function See Pace Art report No changes today  2. HTN Stable No change required today  Merlin Return to see me in 1 year

## 2014-06-26 NOTE — Patient Instructions (Signed)
Your physician recommends that you continue on your current medications as directed. Please refer to the Current Medication list given to you today.  Your physician wants you to follow-up in: 1 YEAR WITH DR. ALLRED. You will receive a reminder letter in the mail two months in advance. If you don't receive a letter, please call our office to schedule the follow-up appointment.  Remote monitoring is used to monitor your Pacemaker of ICD from home. This monitoring reduces the number of office visits required to check your device to one time per year. It allows us to keep an eye on the functioning of your device to ensure it is working properly. You are scheduled for a device check from home on 09/25/2014. You may send your transmission at any time that day. If you have a wireless device, the transmission will be sent automatically. After your physician reviews your transmission, you will receive a postcard with your next transmission date.   

## 2014-07-02 ENCOUNTER — Encounter: Payer: Self-pay | Admitting: Internal Medicine

## 2014-08-22 ENCOUNTER — Encounter: Payer: Self-pay | Admitting: Internal Medicine

## 2014-08-22 ENCOUNTER — Ambulatory Visit (INDEPENDENT_AMBULATORY_CARE_PROVIDER_SITE_OTHER): Payer: Medicare PPO | Admitting: Internal Medicine

## 2014-08-22 ENCOUNTER — Other Ambulatory Visit: Payer: Self-pay | Admitting: *Deleted

## 2014-08-22 VITALS — BP 120/84 | HR 72 | Temp 97.5°F | Resp 16 | Ht 63.0 in | Wt 149.0 lb

## 2014-08-22 DIAGNOSIS — R7303 Prediabetes: Secondary | ICD-10-CM

## 2014-08-22 DIAGNOSIS — I10 Essential (primary) hypertension: Secondary | ICD-10-CM

## 2014-08-22 DIAGNOSIS — Z9181 History of falling: Secondary | ICD-10-CM

## 2014-08-22 DIAGNOSIS — Z0001 Encounter for general adult medical examination with abnormal findings: Secondary | ICD-10-CM

## 2014-08-22 DIAGNOSIS — Z79899 Other long term (current) drug therapy: Secondary | ICD-10-CM | POA: Insufficient documentation

## 2014-08-22 DIAGNOSIS — E785 Hyperlipidemia, unspecified: Secondary | ICD-10-CM

## 2014-08-22 DIAGNOSIS — Z1212 Encounter for screening for malignant neoplasm of rectum: Secondary | ICD-10-CM

## 2014-08-22 DIAGNOSIS — E559 Vitamin D deficiency, unspecified: Secondary | ICD-10-CM

## 2014-08-22 DIAGNOSIS — Z1331 Encounter for screening for depression: Secondary | ICD-10-CM

## 2014-08-22 DIAGNOSIS — R6889 Other general symptoms and signs: Secondary | ICD-10-CM

## 2014-08-22 LAB — CBC WITH DIFFERENTIAL/PLATELET
BASOS PCT: 2 % — AB (ref 0–1)
Basophils Absolute: 0.1 10*3/uL (ref 0.0–0.1)
EOS ABS: 0.1 10*3/uL (ref 0.0–0.7)
Eosinophils Relative: 2 % (ref 0–5)
HCT: 38.5 % (ref 36.0–46.0)
Hemoglobin: 12.6 g/dL (ref 12.0–15.0)
Lymphocytes Relative: 46 % (ref 12–46)
Lymphs Abs: 1.3 10*3/uL (ref 0.7–4.0)
MCH: 28.1 pg (ref 26.0–34.0)
MCHC: 32.7 g/dL (ref 30.0–36.0)
MCV: 85.7 fL (ref 78.0–100.0)
Monocytes Absolute: 0.3 10*3/uL (ref 0.1–1.0)
Monocytes Relative: 11 % (ref 3–12)
NEUTROS PCT: 39 % — AB (ref 43–77)
Neutro Abs: 1.1 10*3/uL — ABNORMAL LOW (ref 1.7–7.7)
PLATELETS: 215 10*3/uL (ref 150–400)
RBC: 4.49 MIL/uL (ref 3.87–5.11)
RDW: 14.5 % (ref 11.5–15.5)
WBC: 2.9 10*3/uL — ABNORMAL LOW (ref 4.0–10.5)

## 2014-08-22 LAB — HEMOGLOBIN A1C
HEMOGLOBIN A1C: 6 % — AB (ref <5.7)
Mean Plasma Glucose: 126 mg/dL — ABNORMAL HIGH (ref <117)

## 2014-08-22 MED ORDER — CITALOPRAM HYDROBROMIDE 20 MG PO TABS: 20.0000 mg | ORAL_TABLET | Freq: Every day | ORAL | Status: DC

## 2014-08-22 MED ORDER — TRETINOIN (EMOLLIENT) 0.05 % EX CREA: TOPICAL_CREAM | CUTANEOUS | Status: DC

## 2014-08-22 NOTE — Patient Instructions (Signed)
Recommend the book "The END of DIETING" by Dr Baker Janus   and the book "The END of DIABETES " by Dr Excell Seltzer  At [email or web address] - get book & Audio CD's      Being diabetic has a  300% increased risk for heart attack, stroke, cancer, and alzheimer- type vascular dementia. It is very important that you work harder with diet by avoiding all foods that are white except chicken & fish. Avoid white rice (brown & wild rice is OK), white potatoes (sweetpotatoes in moderation is OK), White bread or wheat bread or anything made out of white flour like bagels, donuts, rolls, buns, biscuits, cakes, pastries, cookies, pizza crust, and pasta (made from white flour & egg whites) - vegetarian pasta or spinach or wheat pasta is OK. Multigrain breads like Arnold's or [employer], or multigrain sandwich thins or flatbreads.  Diet, exercise and weight loss can reverse and cure diabetes in the early stages.  Diet, exercise and weight loss is very important in the control and prevention of complications of diabetes which affects every system in your body, ie. Brain - dementia/stroke, eyes - glaucoma/blindness, heart - heart attack/heart failure, kidneys - dialysis, stomach - gastric paralysis, intestines - malabsorption, nerves - severe painful neuritis, circulation - gangrene & loss of a leg(s), and finally cancer and Alzheimers.    I recommend avoid fried & greasy foods,  sweets/candy, white rice (brown or wild rice or Quinoa is OK), white potatoes (sweet potatoes are OK) - anything made from white flour - bagels, doughnuts, rolls, buns, biscuits,white and wheat breads, pizza crust and traditional pasta made of white flour & egg white(vegetarian pasta or spinach or wheat pasta is OK).  Multi-grain bread is OK - like multi-grain flat bread or sandwich thins. Avoid alcohol in excess. Exercise is also important.    Eat all the vegetables you want - avoid meat, especially red meat and dairy - especially cheese.  Cheese  is the most concentrated form of trans-fats which is the worst thing to clog up our arteries. Veggie cheese is OK which can be found in the fresh produce section at [employer] or [employer] or [employer]  Preventive Care for Adults A healthy lifestyle and preventive care can promote health and wellness. Preventive health guidelines for women include the following key practices.  A routine yearly physical is a good way to check with your health care provider about your health and preventive screening. It is a chance to share any concerns and updates on your health and to receive a thorough exam.  Visit your dentist for a routine exam and preventive care every 6 months. Brush your teeth twice a day and floss once a day. Good oral hygiene prevents tooth decay and gum disease.  The frequency of eye exams is based on your age, health, family medical history, use of contact lenses, and other factors. Follow your health care provider's recommendations for frequency of eye exams.  Eat a healthy diet. Foods like vegetables, fruits, whole grains, low-fat dairy products, and lean protein foods contain the nutrients you need without too many calories. Decrease your intake of foods high in solid fats, added sugars, and salt. Eat the right amount of calories for you.Get information about a proper diet from your health care provider, if necessary.  Regular physical exercise is one of the most important things you can do for your health. Most adults should get at least 150 minutes of moderate-intensity exercise (any activity that increases  your heart rate and causes you to sweat) each week. In addition, most adults need muscle-strengthening exercises on 2 or more days a week.  Maintain a healthy weight. The body mass index (BMI) is a screening tool to identify possible weight problems. It provides an estimate of body fat based on height and weight. Your health care provider can find your BMI and can help you  achieve or maintain a healthy weight.For adults 20 years and older:  A BMI below 18.5 is considered underweight.  A BMI of 18.5 to 24.9 is normal.  A BMI of 25 to 29.9 is considered overweight.  A BMI of 30 and above is considered obese.  Maintain normal blood lipids and cholesterol levels by exercising and minimizing your intake of saturated fat. Eat a balanced diet with plenty of fruit and vegetables. If your lipid or cholesterol levels are high, you are over 50, or you are at high risk for heart disease, you may need your cholesterol levels checked more frequently.Ongoing high lipid and cholesterol levels should be treated with medicines if diet and exercise are not working.  If you smoke, find out from your health care provider how to quit. If you do not use tobacco, do not start.  Lung cancer screening is recommended for adults aged 55-80 years who are at high risk for developing lung cancer because of a history of smoking. A yearly low-dose CT scan of the lungs is recommended for people who have at least a 30-pack-year history of smoking and are a current smoker or have quit within the past 15 years. A pack year of smoking is smoking an average of 1 pack of cigarettes a day for 1 year (for example: 1 pack a day for 30 years or 2 packs a day for 15 years). Yearly screening should continue until the smoker has stopped smoking for at least 15 years. Yearly screening should be stopped for people who develop a health problem that would prevent them from having lung cancer treatment.  Avoid use of street drugs. Do not share needles with anyone. Ask for help if you need support or instructions about stopping the use of drugs.  High blood pressure causes heart disease and increases the risk of stroke.  Ongoing high blood pressure should be treated with medicines if weight loss and exercise do not work.  If you are 55-79 years old, ask your health care provider if you should take aspirin to  prevent strokes.  Diabetes screening involves taking a blood sample to check your fasting blood sugar level. This should be done once every 3 years, after age 45, if you are within normal weight and without risk factors for diabetes. Testing should be considered at a younger age or be carried out more frequently if you are overweight and have at least 1 risk factor for diabetes.  Breast cancer screening is essential preventive care for women. You should practice "breast self-awareness." This means understanding the normal appearance and feel of your breasts and may include breast self-examination. Any changes detected, no matter how small, should be reported to a health care provider. Women in their 20s and 30s should have a clinical breast exam (CBE) by a health care provider as part of a regular health exam every 1 to 3 years. After age 40, women should have a CBE every year. Starting at age 40, women should consider having a mammogram (breast X-ray test) every year. Women who have a family history of breast cancer should   talk to their health care provider about genetic screening. Women at a high risk of breast cancer should talk to their health care providers about having an MRI and a mammogram every year.  Breast cancer gene (BRCA)-related cancer risk assessment is recommended for women who have family members with BRCA-related cancers. BRCA-related cancers include breast, ovarian, tubal, and peritoneal cancers. Having family members with these cancers may be associated with an increased risk for harmful changes (mutations) in the breast cancer genes BRCA1 and BRCA2. Results of the assessment will determine the need for genetic counseling and BRCA1 and BRCA2 testing.  Routine pelvic exams to screen for cancer are no longer recommended for nonpregnant women who are considered low risk for cancer of the pelvic organs (ovaries, uterus, and vagina) and who do not have symptoms. Ask your health care provider  if a screening pelvic exam is right for you.  If you have had past treatment for cervical cancer or a condition that could lead to cancer, you need Pap tests and screening for cancer for at least 20 years after your treatment. If Pap tests have been discontinued, your risk factors (such as having a new sexual partner) need to be reassessed to determine if screening should be resumed. Some women have medical problems that increase the chance of getting cervical cancer. In these cases, your health care provider may recommend more frequent screening and Pap tests.    Colorectal cancer can be detected and often prevented. Most routine colorectal cancer screening begins at the age of 90 years and continues through age 8 years. However, your health care provider may recommend screening at an earlier age if you have risk factors for colon cancer. On a yearly basis, your health care provider may provide home test kits to check for hidden blood in the stool. Use of a small camera at the end of a tube, to directly examine the colon (sigmoidoscopy or colonoscopy), can detect the earliest forms of colorectal cancer. Talk to your health care provider about this at age 86, when routine screening begins. Direct exam of the colon should be repeated every 5-10 years through age 32 years, unless early forms of pre-cancerous polyps or small growths are found.  Osteoporosis is a disease in which the bones lose minerals and strength with aging. This can result in serious bone fractures or breaks. The risk of osteoporosis can be identified using a bone density scan. Women ages 75 years and over and women at risk for fractures or osteoporosis should discuss screening with their health care providers. Ask your health care provider whether you should take a calcium supplement or vitamin D to reduce the rate of osteoporosis.  Menopause can be associated with physical symptoms and risks. Hormone replacement therapy is available to  decrease symptoms and risks. You should talk to your health care provider about whether hormone replacement therapy is right for you.  Use sunscreen. Apply sunscreen liberally and repeatedly throughout the day. You should seek shade when your shadow is shorter than you. Protect yourself by wearing long sleeves, pants, a wide-brimmed hat, and sunglasses year round, whenever you are outdoors.  Once a month, do a whole body skin exam, using a mirror to look at the skin on your back. Tell your health care provider of new moles, moles that have irregular borders, moles that are larger than a pencil eraser, or moles that have changed in shape or color.  Stay current with required vaccines (immunizations).  Influenza vaccine. All adults  should be immunized every year.  Tetanus, diphtheria, and acellular pertussis (Td, Tdap) vaccine. Pregnant women should receive 1 dose of Tdap vaccine during each pregnancy. The dose should be obtained regardless of the length of time since the last dose. Immunization is preferred during the 27th-36th week of gestation. An adult who has not previously received Tdap or who does not know her vaccine status should receive 1 dose of Tdap. This initial dose should be followed by tetanus and diphtheria toxoids (Td) booster doses every 10 years. Adults with an unknown or incomplete history of completing a 3-dose immunization series with Td-containing vaccines should begin or complete a primary immunization series including a Tdap dose. Adults should receive a Td booster every 10 years.    Zoster vaccine. One dose is recommended for adults aged 38 years or older unless certain conditions are present.    Pneumococcal 13-valent conjugate (PCV13) vaccine. When indicated, a person who is uncertain of her immunization history and has no record of immunization should receive the PCV13 vaccine. An adult aged 94 years or older who has certain medical conditions and has not been previously  immunized should receive 1 dose of PCV13 vaccine. This PCV13 should be followed with a dose of pneumococcal polysaccharide (PPSV23) vaccine. The PPSV23 vaccine dose should be obtained at least 8 weeks after the dose of PCV13 vaccine. An adult aged 42 years or older who has certain medical conditions and previously received 1 or more doses of PPSV23 vaccine should receive 1 dose of PCV13. The PCV13 vaccine dose should be obtained 1 or more years after the last PPSV23 vaccine dose.    Pneumococcal polysaccharide (PPSV23) vaccine. When PCV13 is also indicated, PCV13 should be obtained first. All adults aged 55 years and older should be immunized. An adult younger than age 50 years who has certain medical conditions should be immunized. Any person who resides in a nursing home or long-term care facility should be immunized. An adult smoker should be immunized. People with an immunocompromised condition and certain other conditions should receive both PCV13 and PPSV23 vaccines. People with human immunodeficiency virus (HIV) infection should be immunized as soon as possible after diagnosis. Immunization during chemotherapy or radiation therapy should be avoided. Routine use of PPSV23 vaccine is not recommended for American Indians, [place], or people younger than 65 years unless there are medical conditions that require PPSV23 vaccine. When indicated, people who have unknown immunization and have no record of immunization should receive PPSV23 vaccine. One-time revaccination 5 years after the first dose of PPSV23 is recommended for people aged 19-64 years who have chronic kidney failure, nephrotic syndrome, asplenia, or immunocompromised conditions. People who received 1-2 doses of PPSV23 before age 19 years should receive another dose of PPSV23 vaccine at age 22 years or later if at least 5 years have passed since the previous dose. Doses of PPSV23 are not needed for people immunized with PPSV23 at or after  age 15 years.   Preventive Services / Frequency  Ages 21 years and over  Blood pressure check.** / Every 1 to 2 years.  Lipid and cholesterol check.** / Every 5 years beginning at age 74 years.  Lung cancer screening. / Every year if you are aged 44-80 years and have a 30-pack-year history of smoking and currently smoke or have quit within the past 15 years. Yearly screening is stopped once you have quit smoking for at least 15 years or develop a health problem that would prevent you from having  lung cancer treatment.  Clinical breast exam.** / Every year after age 88 years.  BRCA-related cancer risk assessment.** / For women who have family members with a BRCA-related cancer (breast, ovarian, tubal, or peritoneal cancers).  Mammogram.** / Every year beginning at age 53 years and continuing for as long as you are in good health. Consult with your health care provider.  Pap test.** / Every 3 years starting at age 84 years through age 24 or 29 years with 3 consecutive normal Pap tests. Testing can be stopped between 65 and 70 years with 3 consecutive normal Pap tests and no abnormal Pap or HPV tests in the past 10 years.  Fecal occult blood test (FOBT) of stool. / Every year beginning at age 67 years and continuing until age 70 years. You may not need to do this test if you get a colonoscopy every 10 years.  Flexible sigmoidoscopy or colonoscopy.** / Every 5 years for a flexible sigmoidoscopy or every 10 years for a colonoscopy beginning at age 22 years and continuing until age 27 years.  Hepatitis C blood test.** / For all people born from [birth year] through 1966 and any individual with known risks for hepatitis C.  Osteoporosis screening.** / A one-time screening for women ages 66 years and over and women at risk for fractures or osteoporosis.  Skin self-exam. / Monthly.  Influenza vaccine. / Every year.  Tetanus, diphtheria, and acellular pertussis (Tdap/Td) vaccine.** / 1 dose of Td  every 10 years.  Zoster vaccine.** / 1 dose for adults aged 72 years or older.  Pneumococcal 13-valent conjugate (PCV13) vaccine.** / Consult your health care provider.  Pneumococcal polysaccharide (PPSV23) vaccine.** / 1 dose for all adults aged 54 years and older.

## 2014-08-22 NOTE — Progress Notes (Signed)
Patient ID: Alexandra Wheeler, female   DOB: [birth year], 73 y.o.   MRN: [id number]  Annual Screening Comprehensive Examination  This very nice 73 y.o.WWF presents for complete physical.  Patient has been followed for LabileHTN,   Prediabetes, Hyperlipidemia, and Vitamin D Deficiency.    Patient has been monitored expectantly for labile HTN many years. She take HCTZ sparingly for dependent edema which in years past was associated with volume related HTN. Patient's BP has been controlled at home and patient denies any cardiac symptoms as chest pain, palpitations, shortness of breath, dizziness or ankle swelling. Today's BP: 120/84 mmHg    Patient's hyperlipidemia is controlled with diet and medications. Patient denies myalgias or other medication SE's. Last lipids were  At goal - Total Cholesterol 148; HDL  53; LDL 85; Triglycerides 52 on 04/29/2014.   Patient has  prediabetes predating since July 2011 and patient denies reactive hypoglycemic symptoms, visual blurring, diabetic polys, or paresthesias. Last A1c was 5.9% on  04/29/2014.   Finally, patient has history of Vitamin D Deficiency (34 in 2008)  and last Vitamin D was 120 on 01/22/2014 and dose 9%,000 u) was decreased from qd to 4 x/wk.  Medication Sig  . ALPRAZolam (XANAX) 1 MG tablet 1/2 to 1 tab TID PRN for anxiety  . Ascorbic Acid (VITAMIN C PO) Take 1 tablet by mouth daily.   Marland Kitchen. aspirin EC 81 MG tablet Take 81 mg by mouth daily.  Marland Kitchen. BIOTIN PO Take 1 tablet by mouth daily.   . Calcium Carbonate-Vitamin D (CALCIUM + D PO) Take 1 tablet by mouth daily.   . Cholecalciferol (VITAMIN D PO) Take 5,000 Units by mouth daily.   Marland Kitchen. FOLIC ACID PO Take 1 tablet by mouth daily.   . hydrochlorothiazide (HYDRODIURIL) 25 MG tablet Take 25 mg by mouth as needed.  . hydrocortisone (ANUSOL-HC) 2.5 % rectal cream Place 1 application rectally 2 (two) times daily.  Marland Kitchen. MAGNESIUM PO Take 500 mg by mouth daily.   . Multiple Vitamin (MULTIVITAMIN) tablet Take 1  tablet by mouth daily.  . Omega-3 Fatty Acids (FISH OIL PO) Take 1 capsule by mouth daily.   . ranitidine (ZANTAC) 300 MG tablet Take 1 tablet (300 mg total) by mouth at bedtime.  . Red Yeast Rice Extract (RED YEAST RICE PO) Take 1 tablet by mouth daily.    Allergies  Allergen Reactions  . Actonel [Risedronate Sodium]   . Boniva [Ibandronic Acid]   . Evista [Raloxifene]   . Fosamax [Alendronate Sodium]   . Levsin [Hyoscyamine Sulfate]   . Mobic [Meloxicam]   . Topamax [Topiramate]   . Zoloft [Sertraline Hcl]    Past Medical History  Diagnosis Date  . Osteoporosis   . DI (detrusor instability)   . Atrophic vaginitis   . Anxiety   . Colon polyps   . H/O echocardiogram     a. Echo 11/2012:EF 50-55%, focal basal septal hypertrophy with some mitral valve SAM (mild) and MR.  This may be a sigmoid septum with advanced age or could be a variant of hypertrophic cardiomyopathy.  Marland Kitchen. LBBB (left bundle branch block)   . Depression   . HTN (hypertension)   . Hyperlipidemia   . Chest pain     a. Adenosine sestamibi (1/14) with no evidence of ischemia or infarction, EF 63%.   . Complete heart block 06-2013    status post pacemaker implantation by Dr Johney FrameAllred 06-11-2013  . Migraines   . Prediabetes   . Vitamin D  deficiency   . GERD (gastroesophageal reflux disease)     History + Hyplori via EGD  . Leukopenia 05/19/2014  . Lymphocytosis 05/19/2014   Immunization History  Administered Date(s) Administered  . Influenza Split 08/06/2014  . Pneumococcal-Unspecified 10/08/2010  . Td 05/09/2011  . Zoster 05/09/2011   Health Maintenance  Topic Date Due  . Influenza Vaccine  06/08/2015  . Mammogram  04/07/2016  . Tetanus/tdap  05/08/2021  . Colonoscopy  11/12/2023  . Pneumococcal Polysaccharide Vaccine Age 465 And Over  Completed  . Zostavax  Completed   Past Surgical History  Procedure Laterality Date  . Breast surgery      Breast cyst  . Tubal ligation    . Hemorrhoid surgery    .  Pacemaker insertion  06-11-2013    STJ dual chamber pacemaker implanted by Dr Johney FrameAllred for complete heart block   Family History  Problem Relation Age of Onset  . Diabetes Mother   . Heart disease Mother   . Diabetes Brother   . Hypertension Brother   . Heart disease Brother   . Breast cancer Sister [id number]  . Aneurysm Brother   . Stroke Brother   . Cancer Father     prostate  . Diabetes Brother   . Heart disease Brother    History  Substance Use Topics  . Smoking status: Former Smoker    Types: Cigarettes    Quit date: 10/09/1975  . Smokeless tobacco: Never Used  . Alcohol Use: No    ROS Constitutional: Denies fever, chills, weight loss/gain, headaches, insomnia, fatigue, night sweats, and change in appetite. Eyes: Denies redness, blurred vision, diplopia, discharge, itchy, watery eyes.  ENT: Denies discharge, congestion, post nasal drip, epistaxis, sore throat, earache, hearing loss, dental pain, Tinnitus, Vertigo, Sinus pain, snoring.  Cardio: Denies chest pain, palpitations, irregular heartbeat, syncope, dyspnea, diaphoresis, orthopnea, PND, claudication, edema Respiratory: denies cough, dyspnea, DOE, pleurisy, hoarseness, laryngitis, wheezing.  Gastrointestinal: Denies dysphagia, heartburn, reflux, water brash, pain, cramps, nausea, vomiting, bloating, diarrhea, constipation, hematemesis, melena, hematochezia, jaundice, hemorrhoids Genitourinary: Denies dysuria, frequency, urgency, nocturia, hesitancy, discharge, hematuria, flank pain Breast: Breast lumps, nipple discharge, bleeding.  Musculoskeletal: Denies arthralgia, myalgia, stiffness, Jt. Swelling, pain, limp, and strain/sprain. Denies falls. Skin: Denies puritis, rash, hives, warts, acne, eczema, changing in skin lesion Neuro: No weakness, tremor, incoordination, spasms, paresthesia, pain Psychiatric: Denies confusion, memory loss, sensory loss. Denies Depression. Endocrine: Denies change in weight, skin, hair change,  nocturia, and paresthesia, diabetic polys, visual blurring, hyper / hypo glycemic episodes.  Heme/Lymph: No excessive bleeding, bruising, enlarged lymph nodes.  Physical Exam  BP 120/84  Pulse 72  Temp(Src) 97.5 F (36.4 C) (Temporal)  Resp 16  Ht 5\' 3"  (1.6 m)  Wt 149 lb (67.586 kg)  BMI 26.40 kg/m2  General Appearance: Well nourished and in no apparent distress. Eyes: PERRLA, EOMs, conjunctiva no swelling or erythema, normal fundi and vessels. Sinuses: No frontal/maxillary tenderness ENT/Mouth: EACs patent / TMs  nl. Nares clear without erythema, swelling, mucoid exudates. Oral hygiene is good. No erythema, swelling, or exudate. Tongue normal, non-obstructing. Tonsils not swollen or erythematous. Hearing normal.  Neck: Supple, thyroid normal. No bruits, nodes or JVD. Respiratory: Respiratory effort normal.  BS equal and clear bilateral without rales, rhonci, wheezing or stridor. Cardio: Heart sounds are normal with regular rate and rhythm and no murmurs, rubs or gallops. Peripheral pulses are normal and equal bilaterally without edema. No aortic or femoral bruits. Chest: symmetric with normal excursions and percussion. Breasts: Symmetric,  without lumps, nipple discharge, retractions, or fibrocystic changes.  Abdomen: Flat, soft, with bowl sounds. Nontender, no guarding, rebound, hernias, masses, or organomegaly.  Lymphatics: Non tender without lymphadenopathy.  Musculoskeletal: Full ROM all peripheral extremities, joint stability, 5/5 strength, and normal gait. Skin: Warm and dry without rashes, lesions, cyanosis, clubbing or  ecchymosis.  Neuro: Cranial nerves intact, reflexes equal bilaterally. Normal muscle tone, no cerebellar symptoms. Sensation intact.  Pysch: Awake and oriented X 3, normal affect, Insight and Judgment appropriate.  Assessment and Plan  1. Encounter for general adult medical examination with abnormal findings   2. Essential hypertension  -  Monitoring  expectantly -  Microalbumin / creatinine urine ratio -  EKG 12-Lead -  [place], RETROPERITNL ABD,  LTD -  TSH  3. Hyperlipidemia  - Monitoring on Diet - Lipid panel  4. Prediabetes  - Hemoglobin A1c - Insulin, fasting  5. Vitamin D deficiency  - Vit D  Level  6. Screening for rectal cancer  - POC Hemoccult Bld/Stl (3-Cd Home Screen); Future  7. Medication management  - Urine Microscopic - CBC with Differential - BASIC METABOLIC PANEL WITH GFR - Hepatic function panel - Magnesium  8. At low risk for fall   9. Screening for depression  - Negative screen   Continue prudent diet as discussed, weight control, BP monitoring, regular exercise, and medications. Discussed med's effects and SE's. Screening labs and tests as requested with regular follow-up as recommended.

## 2014-08-23 LAB — HEPATIC FUNCTION PANEL
ALT: 27 U/L (ref 0–35)
AST: 26 U/L (ref 0–37)
Albumin: 3.8 g/dL (ref 3.5–5.2)
Alkaline Phosphatase: 72 U/L (ref 39–117)
BILIRUBIN TOTAL: 0.3 mg/dL (ref 0.2–1.2)
Bilirubin, Direct: 0.1 mg/dL (ref 0.0–0.3)
Indirect Bilirubin: 0.2 mg/dL (ref 0.2–1.2)
Total Protein: 6.3 g/dL (ref 6.0–8.3)

## 2014-08-23 LAB — URINALYSIS, MICROSCOPIC ONLY
Bacteria, UA: NONE SEEN
Casts: NONE SEEN
Crystals: NONE SEEN
Squamous Epithelial / LPF: NONE SEEN

## 2014-08-23 LAB — TSH: TSH: 1.609 u[IU]/mL (ref 0.350–4.500)

## 2014-08-23 LAB — LIPID PANEL
CHOLESTEROL: 137 mg/dL (ref 0–200)
HDL: 58 mg/dL (ref >39)
LDL Cholesterol: 70 mg/dL (ref 0–99)
Total CHOL/HDL Ratio: 2.4 Ratio
Triglycerides: 43 mg/dL (ref <150)
VLDL: 9 mg/dL (ref 0–40)

## 2014-08-23 LAB — INSULIN, FASTING: INSULIN FASTING, SERUM: 6 u[IU]/mL (ref 2.0–19.6)

## 2014-08-23 LAB — MICROALBUMIN / CREATININE URINE RATIO: CREATININE, URINE: 12.5 mg/dL

## 2014-08-23 LAB — BASIC METABOLIC PANEL WITH GFR
BUN: 12 mg/dL (ref 6–23)
CALCIUM: 9.3 mg/dL (ref 8.4–10.5)
CO2: 29 mEq/L (ref 19–32)
Chloride: 104 mEq/L (ref 96–112)
Creat: 0.76 mg/dL (ref 0.50–1.10)
GFR, Est Non African American: 78 mL/min
Glucose, Bld: 104 mg/dL — ABNORMAL HIGH (ref 70–99)
Potassium: 4.5 mEq/L (ref 3.5–5.3)
SODIUM: 139 meq/L (ref 135–145)

## 2014-08-23 LAB — VITAMIN D 25 HYDROXY (VIT D DEFICIENCY, FRACTURES): VIT D 25 HYDROXY: 104 ng/mL — AB (ref 30–89)

## 2014-08-23 LAB — MAGNESIUM: MAGNESIUM: 2.2 mg/dL (ref 1.5–2.5)

## 2014-09-01 ENCOUNTER — Other Ambulatory Visit (HOSPITAL_BASED_OUTPATIENT_CLINIC_OR_DEPARTMENT_OTHER): Payer: Medicare PPO

## 2014-09-01 ENCOUNTER — Encounter: Payer: Self-pay | Admitting: Hematology and Oncology

## 2014-09-01 ENCOUNTER — Other Ambulatory Visit (INDEPENDENT_AMBULATORY_CARE_PROVIDER_SITE_OTHER): Payer: Medicare PPO | Admitting: *Deleted

## 2014-09-01 ENCOUNTER — Ambulatory Visit (HOSPITAL_BASED_OUTPATIENT_CLINIC_OR_DEPARTMENT_OTHER): Payer: Medicare PPO | Admitting: Hematology and Oncology

## 2014-09-01 VITALS — BP 136/80 | HR 77 | Temp 98.5°F | Resp 18 | Ht 63.0 in | Wt 146.1 lb

## 2014-09-01 DIAGNOSIS — Z1212 Encounter for screening for malignant neoplasm of rectum: Secondary | ICD-10-CM

## 2014-09-01 DIAGNOSIS — D72819 Decreased white blood cell count, unspecified: Secondary | ICD-10-CM

## 2014-09-01 LAB — CBC WITH DIFFERENTIAL/PLATELET
BASO%: 1 % (ref 0.0–2.0)
BASOS ABS: 0 10*3/uL (ref 0.0–0.1)
EOS%: 1.8 % (ref 0.0–7.0)
Eosinophils Absolute: 0.1 10*3/uL (ref 0.0–0.5)
HCT: 41.4 % (ref 34.8–46.6)
HEMOGLOBIN: 12.9 g/dL (ref 11.6–15.9)
LYMPH%: 35.5 % (ref 14.0–49.7)
MCH: 27.4 pg (ref 25.1–34.0)
MCHC: 31.1 g/dL — ABNORMAL LOW (ref 31.5–36.0)
MCV: 87.9 fL (ref 79.5–101.0)
MONO#: 0.3 10*3/uL (ref 0.1–0.9)
MONO%: 8 % (ref 0.0–14.0)
NEUT#: 2.2 10*3/uL (ref 1.5–6.5)
NEUT%: 53.7 % (ref 38.4–76.8)
Platelets: 181 10*3/uL (ref 145–400)
RBC: 4.71 10*6/uL (ref 3.70–5.45)
RDW: 13.4 % (ref 11.2–14.5)
WBC: 4 10*3/uL (ref 3.9–10.3)
lymph#: 1.4 10*3/uL (ref 0.9–3.3)

## 2014-09-01 LAB — POC HEMOCCULT BLD/STL (HOME/3-CARD/SCREEN)
Card #2 Fecal Occult Blod, POC: NEGATIVE
Card #3 Fecal Occult Blood, POC: NEGATIVE
FECAL OCCULT BLD: NEGATIVE

## 2014-09-01 LAB — MORPHOLOGY: PLT EST: ADEQUATE

## 2014-09-01 NOTE — Assessment & Plan Note (Signed)
Cause is unknown, likely related to mild bone marrow suppression from some mild low grade viral infection. This has resolved. I reassured the patient.

## 2014-09-01 NOTE — Progress Notes (Signed)
[name] OFFICE PROGRESS NOTE  Wheeler,Alexandra DAVID, MD SUMMARY OF HEMATOLOGIC HISTORY: She was found to have abnormal CBC from routine blood work done at her physician's office. 3 months ago, her white blood cell count was low at 3.8 and recently at 2.4. She was asymptomatic and I recommended observation  INTERVAL HISTORY: Alexandra Wheeler 73 y.o. female returns for follow-up. She denies recent infections  I have reviewed the past medical history, past surgical history, social history and family history with the patient and they are unchanged from previous note.  ALLERGIES:  is allergic to actonel; boniva; evista; fosamax; levsin; mobic; topamax; and zoloft.  MEDICATIONS:  Current Outpatient Prescriptions  Medication Sig Dispense Refill  . ALPRAZolam (XANAX) 1 MG tablet 1/2 to 1 tab TID PRN for anxiety  270 tablet  1  . Ascorbic Acid (VITAMIN C PO) Take 1 tablet by mouth daily.       Marland Kitchen. aspirin EC 81 MG tablet Take 81 mg by mouth daily.      Marland Kitchen. BIOTIN PO Take 1 tablet by mouth daily.       . Calcium Carbonate-Vitamin D (CALCIUM + D PO) Take 1 tablet by mouth daily.       . Cholecalciferol (VITAMIN D PO) Take 5,000 Units by mouth daily.       . citalopram (CELEXA) 20 MG tablet Take 1 tablet (20 mg total) by mouth daily.  90 tablet  4  . FOLIC ACID PO Take 1 tablet by mouth daily.       . hydrochlorothiazide (HYDRODIURIL) 25 MG tablet Take 25 mg by mouth as needed.      . hydrocortisone (ANUSOL-HC) 2.5 % rectal cream Place 1 application rectally 2 (two) times daily.  30 g  0  . MAGNESIUM PO Take 500 mg by mouth daily.       . Multiple Vitamin (MULTIVITAMIN) tablet Take 1 tablet by mouth daily.      . Omega-3 Fatty Acids (FISH OIL PO) Take 1 capsule by mouth daily.       . ranitidine (ZANTAC) 300 MG tablet Take 1 tablet (300 mg total) by mouth at bedtime.  90 tablet  1  . Red Yeast Rice Extract (RED YEAST RICE PO) Take 1 tablet by mouth daily.       . Tretinoin, Facial  Wrinkles, (TRETINOIN, EMOLLIENT,) 0.05 % CREA APPLY TO AFFECTED AREA DAILY AS DIRECTED  40 g  2   No current facility-administered medications for this visit.     REVIEW OF SYSTEMS:   Constitutional: Denies fevers, chills or night sweats Eyes: Denies blurriness of vision Ears, nose, mouth, throat, and face: Denies mucositis or sore throat Respiratory: Denies cough, dyspnea or wheezes Cardiovascular: Denies palpitation, chest discomfort or lower extremity swelling Gastrointestinal:  Denies nausea, heartburn or change in bowel habits Skin: Denies abnormal skin rashes Lymphatics: Denies new lymphadenopathy or easy bruising Neurological:Denies numbness, tingling or new weaknesses Behavioral/Psych: Mood is stable, no new changes  All other systems were reviewed with the patient and are negative.  PHYSICAL EXAMINATION: ECOG PERFORMANCE STATUS: 0 - Asymptomatic  Filed Vitals:   09/01/14 1056  BP: 136/80  Pulse: 77  Temp: 98.5 F (36.9 C)  Resp: 18   Filed Weights   09/01/14 1056  Weight: 146 lb 1.6 oz (66.271 kg)    GENERAL:alert, no distress and comfortable SKIN: skin color, texture, turgor are normal, no rashes or significant lesions Musculoskeletal:no cyanosis of digits and no clubbing  NEURO:  alert & oriented x 3 with fluent speech, no focal motor/sensory deficits  LABORATORY DATA:  I have reviewed the data as listed Results for orders placed in visit on 09/01/14 (from the past 48 hour(s))  CBC WITH DIFFERENTIAL     Status: Abnormal   Collection Time    09/01/14 10:35 AM      Result Value Ref Range   WBC 4.0  3.9 - 10.3 10e3/uL   NEUT# 2.2  1.5 - 6.5 10e3/uL   HGB 12.9  11.6 - 15.9 g/dL   HCT 16.141.4  09.634.8 - 04.546.6 %   Platelets 181  145 - 400 10e3/uL   MCV 87.9  79.5 - 101.0 fL   MCH 27.4  25.1 - 34.0 pg   MCHC 31.1 (*) 31.5 - 36.0 g/dL   RBC 4.094.71  8.113.70 - 9.145.45 10e6/uL   RDW 13.4  11.2 - 14.5 %   lymph# 1.4  0.9 - 3.3 10e3/uL   MONO# 0.3  0.1 - 0.9 10e3/uL    Eosinophils Absolute 0.1  0.0 - 0.5 10e3/uL   Basophils Absolute 0.0  0.0 - 0.1 10e3/uL   NEUT% 53.7  38.4 - 76.8 %   LYMPH% 35.5  14.0 - 49.7 %   MONO% 8.0  0.0 - 14.0 %   EOS% 1.8  0.0 - 7.0 %   BASO% 1.0  0.0 - 2.0 %    Lab Results  Component Value Date   WBC 4.0 09/01/2014   HGB 12.9 09/01/2014   HCT 41.4 09/01/2014   MCV 87.9 09/01/2014   PLT 181 09/01/2014    ASSESSMENT & PLAN:  Leukopenia Cause is unknown, likely related to mild bone marrow suppression from some mild low grade viral infection. This has resolved. I reassured the patient.       All questions were answered. The patient knows to call the clinic with any problems, questions or concerns. No barriers to learning was detected.  I spent 10 minutes counseling the patient face to face. The total time spent in the appointment was 15 minutes and more than 50% was on counseling.     Gasper Hopes, MD 09/01/2014 11:15 AM

## 2014-09-04 ENCOUNTER — Encounter: Payer: Self-pay | Admitting: Internal Medicine

## 2014-09-08 ENCOUNTER — Encounter: Payer: Self-pay | Admitting: Hematology and Oncology

## 2014-09-25 ENCOUNTER — Encounter: Payer: Self-pay | Admitting: Internal Medicine

## 2014-09-25 ENCOUNTER — Ambulatory Visit (INDEPENDENT_AMBULATORY_CARE_PROVIDER_SITE_OTHER): Payer: Medicare PPO | Admitting: *Deleted

## 2014-09-25 DIAGNOSIS — I442 Atrioventricular block, complete: Secondary | ICD-10-CM

## 2014-09-25 NOTE — Progress Notes (Signed)
Remote pacemaker transmission.   

## 2014-09-26 LAB — MDC_IDC_ENUM_SESS_TYPE_REMOTE
Battery Remaining Longevity: 115 mo
Battery Remaining Percentage: 92 %
Battery Voltage: 2.99 V
Brady Statistic AS VS Percent: 1 %
Brady Statistic RV Percent Paced: 99 %
Date Time Interrogation Session: 20151119090013
Implantable Pulse Generator Model: 2240
Implantable Pulse Generator Serial Number: 7523768
Lead Channel Impedance Value: 510 Ohm
Lead Channel Pacing Threshold Amplitude: 0.875 V
Lead Channel Pacing Threshold Pulse Width: 0.4 ms
Lead Channel Pacing Threshold Pulse Width: 0.4 ms
Lead Channel Sensing Intrinsic Amplitude: 3.4 mV
Lead Channel Setting Pacing Amplitude: 2 V
Lead Channel Setting Pacing Pulse Width: 0.4 ms
MDC IDC MSMT LEADCHNL RA PACING THRESHOLD AMPLITUDE: 0.5 V
MDC IDC MSMT LEADCHNL RV IMPEDANCE VALUE: 610 Ohm
MDC IDC MSMT LEADCHNL RV SENSING INTR AMPL: 12 mV
MDC IDC SET LEADCHNL RV PACING AMPLITUDE: 1.125
MDC IDC SET LEADCHNL RV SENSING SENSITIVITY: 4 mV
MDC IDC STAT BRADY AP VP PERCENT: 66 %
MDC IDC STAT BRADY AP VS PERCENT: 1 %
MDC IDC STAT BRADY AS VP PERCENT: 34 %
MDC IDC STAT BRADY RA PERCENT PACED: 66 %

## 2014-10-10 ENCOUNTER — Telehealth: Payer: Self-pay | Admitting: *Deleted

## 2014-10-10 NOTE — Telephone Encounter (Signed)
Called pt to adjust her PVARP due to frequent PMT discovered on Merlin. Pt coming to device clinic 10/14/14.

## 2014-10-14 ENCOUNTER — Ambulatory Visit (INDEPENDENT_AMBULATORY_CARE_PROVIDER_SITE_OTHER): Payer: Medicare PPO | Admitting: *Deleted

## 2014-10-14 DIAGNOSIS — I442 Atrioventricular block, complete: Secondary | ICD-10-CM

## 2014-10-14 LAB — MDC_IDC_ENUM_SESS_TYPE_INCLINIC
Battery Remaining Longevity: 122.4 mo
Implantable Pulse Generator Model: 2240
Implantable Pulse Generator Serial Number: 7523768
Lead Channel Impedance Value: 637.5 Ohm
Lead Channel Pacing Threshold Amplitude: 0.75 V
Lead Channel Pacing Threshold Pulse Width: 0.4 ms
Lead Channel Sensing Intrinsic Amplitude: 12 mV
Lead Channel Sensing Intrinsic Amplitude: 2.1 mV
MDC IDC MSMT BATTERY VOLTAGE: 2.99 V
MDC IDC MSMT LEADCHNL RA IMPEDANCE VALUE: 525 Ohm
MDC IDC SESS DTM: 20151208170032
MDC IDC SET LEADCHNL RA PACING AMPLITUDE: 2 V
MDC IDC SET LEADCHNL RV PACING AMPLITUDE: 1 V
MDC IDC SET LEADCHNL RV PACING PULSEWIDTH: 0.4 ms
MDC IDC SET LEADCHNL RV SENSING SENSITIVITY: 4 mV
MDC IDC STAT BRADY RA PERCENT PACED: 66 %
MDC IDC STAT BRADY RV PERCENT PACED: 99.96 %

## 2014-10-14 NOTE — Progress Notes (Signed)
Pacemaker check in clinic to change PVARP due to PMT. Changed PVARP from 275 to 300. Merlin 12-09-14 and ROV in August with JA.

## 2014-10-16 ENCOUNTER — Encounter (HOSPITAL_COMMUNITY): Payer: Self-pay | Admitting: Cardiovascular Disease

## 2014-10-28 ENCOUNTER — Encounter: Payer: Self-pay | Admitting: Internal Medicine

## 2014-12-24 ENCOUNTER — Ambulatory Visit: Payer: Self-pay | Admitting: Physician Assistant

## 2014-12-29 ENCOUNTER — Ambulatory Visit (INDEPENDENT_AMBULATORY_CARE_PROVIDER_SITE_OTHER): Payer: Medicare PPO | Admitting: *Deleted

## 2014-12-29 DIAGNOSIS — I442 Atrioventricular block, complete: Secondary | ICD-10-CM

## 2014-12-29 LAB — MDC_IDC_ENUM_SESS_TYPE_INCLINIC
Battery Voltage: 2.99 V
Brady Statistic RA Percent Paced: 71 %
Brady Statistic RV Percent Paced: 99 %
Implantable Pulse Generator Model: 2240
Lead Channel Impedance Value: 480 Ohm
Lead Channel Impedance Value: 610 Ohm
Lead Channel Sensing Intrinsic Amplitude: 4.1 mV
Lead Channel Setting Pacing Amplitude: 1 V
Lead Channel Setting Pacing Amplitude: 2 V
Lead Channel Setting Pacing Pulse Width: 0.4 ms
MDC IDC MSMT BATTERY REMAINING PERCENTAGE: 95 % — AB
MDC IDC MSMT LEADCHNL RV PACING THRESHOLD AMPLITUDE: 0.75 V
MDC IDC MSMT LEADCHNL RV PACING THRESHOLD PULSEWIDTH: 0.4 ms
MDC IDC MSMT LEADCHNL RV SENSING INTR AMPL: 12 mV
MDC IDC PG SERIAL: 7523768
MDC IDC SET LEADCHNL RV SENSING SENSITIVITY: 4 mV

## 2014-12-29 NOTE — Progress Notes (Signed)
Remote pacemaker transmission.   

## 2015-01-05 ENCOUNTER — Encounter: Payer: Self-pay | Admitting: Cardiology

## 2015-01-12 ENCOUNTER — Encounter: Payer: Self-pay | Admitting: Internal Medicine

## 2015-01-19 ENCOUNTER — Encounter: Payer: Self-pay | Admitting: Cardiology

## 2015-02-18 ENCOUNTER — Telehealth: Payer: Self-pay | Admitting: Internal Medicine

## 2015-02-18 NOTE — Telephone Encounter (Signed)
Follow up      Did we get her transmission?  The box is beeping

## 2015-02-18 NOTE — Telephone Encounter (Signed)
Informed patient that remote was received. No episodes recorded from today. Freeze capture shows AP-VP. Lead measurements are within normal limits. Patient voiced understanding of information given.

## 2015-02-18 NOTE — Telephone Encounter (Signed)
New message      1. Has your device fired? no  2. Is you device beeping? no  3. Are you experiencing draining or swelling at device site? no 4. Are you calling to see if we received your device transmission?no  5. Have you passed out? No Pt has a pacemaker.  She has been feeling dizzy and lightheaded.  Can we pull her transmission reports and see if anything is showing?

## 2015-02-18 NOTE — Telephone Encounter (Signed)
Called pt to request manual transmission. Will review once received and call pt back.

## 2015-03-25 ENCOUNTER — Ambulatory Visit (INDEPENDENT_AMBULATORY_CARE_PROVIDER_SITE_OTHER): Payer: Medicare PPO | Admitting: Internal Medicine

## 2015-03-25 ENCOUNTER — Encounter: Payer: Self-pay | Admitting: Internal Medicine

## 2015-03-25 ENCOUNTER — Other Ambulatory Visit: Payer: Self-pay | Admitting: Internal Medicine

## 2015-03-25 VITALS — BP 108/74 | HR 64 | Temp 97.5°F | Resp 16 | Ht 63.0 in | Wt 154.0 lb

## 2015-03-25 DIAGNOSIS — Z1331 Encounter for screening for depression: Secondary | ICD-10-CM

## 2015-03-25 DIAGNOSIS — N952 Postmenopausal atrophic vaginitis: Secondary | ICD-10-CM

## 2015-03-25 DIAGNOSIS — D72819 Decreased white blood cell count, unspecified: Secondary | ICD-10-CM

## 2015-03-25 DIAGNOSIS — R6889 Other general symptoms and signs: Secondary | ICD-10-CM

## 2015-03-25 DIAGNOSIS — Z1231 Encounter for screening mammogram for malignant neoplasm of breast: Secondary | ICD-10-CM

## 2015-03-25 DIAGNOSIS — Z0000 Encounter for general adult medical examination without abnormal findings: Secondary | ICD-10-CM

## 2015-03-25 DIAGNOSIS — I442 Atrioventricular block, complete: Secondary | ICD-10-CM

## 2015-03-25 DIAGNOSIS — I447 Left bundle-branch block, unspecified: Secondary | ICD-10-CM

## 2015-03-25 DIAGNOSIS — R7303 Prediabetes: Secondary | ICD-10-CM

## 2015-03-25 DIAGNOSIS — Z0001 Encounter for general adult medical examination with abnormal findings: Secondary | ICD-10-CM

## 2015-03-25 DIAGNOSIS — M810 Age-related osteoporosis without current pathological fracture: Secondary | ICD-10-CM

## 2015-03-25 DIAGNOSIS — N3281 Overactive bladder: Secondary | ICD-10-CM

## 2015-03-25 DIAGNOSIS — D72820 Lymphocytosis (symptomatic): Secondary | ICD-10-CM

## 2015-03-25 DIAGNOSIS — Z79899 Other long term (current) drug therapy: Secondary | ICD-10-CM

## 2015-03-25 DIAGNOSIS — Z9181 History of falling: Secondary | ICD-10-CM

## 2015-03-25 DIAGNOSIS — E785 Hyperlipidemia, unspecified: Secondary | ICD-10-CM

## 2015-03-25 DIAGNOSIS — E559 Vitamin D deficiency, unspecified: Secondary | ICD-10-CM

## 2015-03-25 DIAGNOSIS — I10 Essential (primary) hypertension: Secondary | ICD-10-CM

## 2015-03-25 LAB — MAGNESIUM: Magnesium: 2.2 mg/dL (ref 1.5–2.5)

## 2015-03-25 LAB — BASIC METABOLIC PANEL WITH GFR
BUN: 12 mg/dL (ref 6–23)
CALCIUM: 9.1 mg/dL (ref 8.4–10.5)
CO2: 28 meq/L (ref 19–32)
CREATININE: 0.69 mg/dL (ref 0.50–1.10)
Chloride: 105 mEq/L (ref 96–112)
GFR, Est African American: >89 mL/min
GFR, Est Non African American: 86 mL/min
Glucose, Bld: 81 mg/dL (ref 70–99)
Potassium: 4.3 mEq/L (ref 3.5–5.3)
Sodium: 140 mEq/L (ref 135–145)

## 2015-03-25 LAB — CBC WITH DIFFERENTIAL/PLATELET
Basophils Absolute: 0 10*3/uL (ref 0.0–0.1)
Basophils Relative: 1 % (ref 0–1)
EOS PCT: 5 % (ref 0–5)
Eosinophils Absolute: 0.1 10*3/uL (ref 0.0–0.7)
HCT: 39.2 % (ref 36.0–46.0)
HEMOGLOBIN: 12.8 g/dL (ref 12.0–15.0)
LYMPHS ABS: 1.3 10*3/uL (ref 0.7–4.0)
LYMPHS PCT: 48 % — AB (ref 12–46)
MCH: 28 pg (ref 26.0–34.0)
MCHC: 32.7 g/dL (ref 30.0–36.0)
MCV: 85.8 fL (ref 78.0–100.0)
MONO ABS: 0.3 10*3/uL (ref 0.1–1.0)
MPV: 10 fL (ref 8.6–12.4)
Monocytes Relative: 9 % (ref 3–12)
Neutro Abs: 1 10*3/uL — ABNORMAL LOW (ref 1.7–7.7)
Neutrophils Relative %: 37 % — ABNORMAL LOW (ref 43–77)
Platelets: 200 10*3/uL (ref 150–400)
RBC: 4.57 MIL/uL (ref 3.87–5.11)
RDW: 14.5 % (ref 11.5–15.5)
WBC: 2.8 10*3/uL — ABNORMAL LOW (ref 4.0–10.5)

## 2015-03-25 LAB — LIPID PANEL
CHOLESTEROL: 143 mg/dL (ref 0–200)
HDL: 57 mg/dL (ref >=46)
LDL Cholesterol: 74 mg/dL (ref 0–99)
Total CHOL/HDL Ratio: 2.5 Ratio
Triglycerides: 60 mg/dL (ref <150)
VLDL: 12 mg/dL (ref 0–40)

## 2015-03-25 LAB — HEPATIC FUNCTION PANEL
ALK PHOS: 72 U/L (ref 39–117)
ALT: 18 U/L (ref 0–35)
AST: 17 U/L (ref 0–37)
Albumin: 3.8 g/dL (ref 3.5–5.2)
Bilirubin, Direct: 0.1 mg/dL (ref 0.0–0.3)
Indirect Bilirubin: 0.3 mg/dL (ref 0.2–1.2)
Total Bilirubin: 0.4 mg/dL (ref 0.2–1.2)
Total Protein: 6.4 g/dL (ref 6.0–8.3)

## 2015-03-25 LAB — TSH: TSH: 1.369 u[IU]/mL (ref 0.350–4.500)

## 2015-03-25 LAB — HEMOGLOBIN A1C
Hgb A1c MFr Bld: 5.9 % — ABNORMAL HIGH (ref <5.7)
MEAN PLASMA GLUCOSE: 123 mg/dL — AB (ref <117)

## 2015-03-25 NOTE — Patient Instructions (Signed)
Please take prednisone as seen below.  Please take 60 mg on day 1 then take 40 mg daily for four days then you can discontinue.    Please add in walking.

## 2015-03-25 NOTE — Progress Notes (Signed)
Patient ID: Alexandra Wheeler, female   DOB: 21-Oct-1941, 74 y.o.   MRN: [id number]   [facility]  Assessment:    1. LBBB (left bundle branch block) -followed by Dr. Johney FrameAllred  2. CHB (complete heart block) -s/p pacemaker -followed by Dr. Johney FrameAllred 3. Essential hypertension -cont meds -monitor at home -diet and exercise -follow with Dr. Johney FrameAllred too - TSH  4. Prediabetes -diet and exercise - Hemoglobin A1c - Insulin, random  5. Osteoporosis -needs DEXA scan will order  6. DI (detrusor instability) -stable and currently being monitored  7. Atrophic vaginitis -monitor -not currently an issue -not using estrogens  8. Vitamin D deficiency -cont supplement - Vitamin D 1,25 dihydroxy  9. Hyperlipidemia -cont red yeast rice -diet and exercsie - Lipid panel  10. Leukopenia -cbc -followed by Dr. Bertis RuddyGorsuch  11. Lymphocytosis -followed by Dr. Bertis RuddyGorsuch  12. Medication management  - CBC with Differential/Platelet - BASIC METABOLIC PANEL WITH GFR - Hepatic function panel - Magnesium  13. Vitamin D deficiency -cont supplement - Vitamin D 1,25 dihydroxy      Plan:   During the course of the visit the patient was educated and counseled about appropriate screening and preventive services including:    Pneumococcal vaccine   Influenza vaccine  Td vaccine  Screening electrocardiogram  Bone densitometry screening  Colorectal cancer screening  Diabetes screening  Glaucoma screening  Nutrition counseling   Advanced directives: requested  Screening recommendations, referrals: Vaccinations:  Immunization History  Administered Date(s) Administered  . Influenza Split 08/06/2014  . Pneumococcal-Unspecified 10/08/2010  . Td 05/09/2011  . Zoster 05/09/2011   Patient due for Prevnar 13 shot.   Nutrition assessed and recommended  Colonoscopy not indicated Recommended yearly ophthalmology/optometry visit for glaucoma screening and  checkup Recommended yearly dental visit for hygiene and checkup Advanced directives - undecided  Conditions/risks identified: BMI: Discussed weight loss, diet, and increase physical activity.  Increase physical activity: AHA recommends 150 minutes of physical activity a week.  Medications reviewed PreDiabetes is not at goal, ACE/ARB therapy: No, Reason not on Ace Inhibitor/ARB therapy:  not indicated Urinary Incontinence is an issue: discussed non pharmacology and pharmacology options.  Fall risk: low- discussed PT, home fall assessment, medications.   Subjective:    Alexandra Wheeler is a 74 y.o.  female who presents for Medicare Annual Wellness Visit and complete physical.  Date of last medicare wellness visit is unknown.  She has had elevated blood pressure for many years.. Her blood pressure has been controlled at home, & today their BP is BP: 108/74 mmHg She does not workout. She reports that she used to walk but she has not been taking the time to go walking.   She denies chest pain, shortness of breath, dizziness.   She is on cholesterol medication and denies myalgias. Her cholesterol is at goal. The cholesterol last visit was:  Lab Results  Component Value Date   CHOL 137 08/22/2014   HDL 58 08/22/2014   LDLCALC 70 08/22/2014   TRIG 43 08/22/2014   CHOLHDL 2.4 08/22/2014    She has had prediabetes since 2011. She has been working on diet and exercise for prediabetes, and denies foot ulcerations, hyperglycemia, hypoglycemia , increased appetite, nausea, paresthesia of the feet, polydipsia, polyuria, visual disturbances, vomiting and weight loss. Last A1C in the office was:  Lab Results  Component Value Date   HGBA1C 6.0* 08/22/2014  She has been working on diet and has been cutting out carbs and  sugars.  Patient is on Vitamin D supplement.   Lab Results  Component Value Date   VD25OH 104* 08/22/2014     She does report the occasional posterior neck pain which bothers her  more at night time and goes away completely in the morning.      Names of Other Physician/Practitioners you currently use: 1. [facility] here for primary care 2. [facility], eye doctor, last visit 2-3 years ago 3. Valma CavaWaldren, dentist, last visit 6 months ago  Patient Care Team: Lucky CowboyWilliam [name], MD as PCP - General (Internal Medicine) Lucky CowboyWilliam [name], MD as Attending Physician (Internal Medicine) Hillis RangeJames Allred, MD as Consulting Physician (Cardiology) Artis DelayNi Gorsuch, MD as Consulting Physician (Hematology and Oncology) Bernette Redbirdobert Buccini, MD as Consulting Physician (Gastroenterology)  Medication Review: Current Outpatient Prescriptions on File Prior to Visit  Medication Sig Dispense Refill  . ALPRAZolam (XANAX) 1 MG tablet 1/2 to 1 tab TID PRN for anxiety 270 tablet 1  . Ascorbic Acid (VITAMIN C PO) Take 1 tablet by mouth daily.     Marland Kitchen. aspirin EC 81 MG tablet Take 81 mg by mouth daily.    Marland Kitchen. BIOTIN PO Take 1 tablet by mouth daily.     . Calcium Carbonate-Vitamin D (CALCIUM + D PO) Take 1 tablet by mouth daily.     . Cholecalciferol (VITAMIN D PO) Take 5,000 Units by mouth daily.     . citalopram (CELEXA) 20 MG tablet Take 1 tablet (20 mg total) by mouth daily. 90 tablet 4  . FOLIC ACID PO Take 1 tablet by mouth daily.     . hydrochlorothiazide (HYDRODIURIL) 25 MG tablet Take 25 mg by mouth as needed.    . hydrocortisone (ANUSOL-HC) 2.5 % rectal cream Place 1 application rectally 2 (two) times daily. 30 g 0  . MAGNESIUM PO Take 500 mg by mouth daily.     . Multiple Vitamin (MULTIVITAMIN) tablet Take 1 tablet by mouth daily.    . Omega-3 Fatty Acids (FISH OIL PO) Take 1 capsule by mouth daily.     . Red Yeast Rice Extract (RED YEAST RICE PO) Take 1 tablet by mouth daily.     . Tretinoin, Facial Wrinkles, (TRETINOIN, EMOLLIENT,) 0.05 % CREA APPLY TO AFFECTED AREA DAILY AS DIRECTED 40 g 2  . ranitidine (ZANTAC) 300 MG tablet Take 1 tablet (300 mg total) by mouth  at bedtime. 90 tablet 1   No current facility-administered medications on file prior to visit.    Current Problems (verified) Patient Active Problem List   Diagnosis Date Noted  . Medication management 08/22/2014  . Leukopenia 05/19/2014  . Lymphocytosis 05/19/2014  . Vitamin D deficiency   . Prediabetes   . HTN (hypertension)   . Hyperlipidemia   . CHB (complete heart block) 06/10/2013  . LBBB (left bundle branch block) 11/15/2012  . Osteoporosis   . DI (detrusor instability)   . Atrophic vaginitis     Screening Tests Health Maintenance  Topic Date Due  . PNA vac Low Risk Adult (2 of 2 - PCV13) 10/09/2011  . INFLUENZA VACCINE  06/08/2015  . MAMMOGRAM  04/07/2016  . TETANUS/TDAP  05/08/2021  . COLONOSCOPY  11/12/2023  . DEXA SCAN  Completed  . ZOSTAVAX  Completed    Immunization History  Administered Date(s) Administered  . Influenza Split 08/06/2014  . Pneumococcal-Unspecified 10/08/2010  . Td 05/09/2011  . Zoster 05/09/2011    Preventative care: Last colonoscopy: 11/11/13  History reviewed: allergies, current medications, past  family history, past medical history, past social history, past surgical history and problem list  Risk Factors: Tobacco History  Substance Use Topics  . Smoking status: Former Smoker    Types: Cigarettes    Quit date: 10/09/1975  . Smokeless tobacco: Never Used  . Alcohol Use: No   She does smoke.  Patient is not a former smoker. Are there smokers in your home (other than you)?  No Alcohol Current alcohol use: none  Caffeine Current caffeine use: denies use  Exercise Current exercise: none  Nutrition/Diet Current diet: in general, a "healthy" diet    Cardiac risk factors: advanced age (older than 26 for men, 34 for women), diabetes mellitus, dyslipidemia, family history of premature cardiovascular disease, hypertension, obesity (BMI >= 30 kg/m2) and sedentary lifestyle.  Depression Screen (Note: if answer to either of  the following is "Yes", a more complete depression screening is indicated)   Q1: Over the past two weeks, have you felt down, depressed or hopeless? No  Q2: Over the past two weeks, have you felt little interest or pleasure in doing things? No  Have you lost interest or pleasure in daily life? No  Do you often feel hopeless? No  Do you cry easily over simple problems? No  Activities of Daily Living In your present state of health, do you have any difficulty performing the following activities?:  Driving? No Managing money?  No Feeding yourself? No Getting from bed to chair? No Climbing a flight of stairs? No Preparing food and eating?: No Bathing or showering? No Getting dressed: No Getting to the toilet? No Using the toilet:No Moving around from place to place: No In the past year have you fallen or had a near fall?:No   Are you sexually active?  No  Do you have more than one partner?  No  Vision Difficulties: No  Hearing Difficulties: No Do you often ask people to speak up or repeat themselves? No Do you experience ringing or noises in your ears? No Do you have difficulty understanding soft or whispered voices? Sometimes.  Cognition  Do you feel that you have a problem with memory?No  Do you often misplace items? No  Do you feel safe at home?  Yes  Advanced directives Does patient have a Health Care Power of Attorney? No Does patient have a Living Will? Yes  Past Medical History  Diagnosis Date  . Osteoporosis   . DI (detrusor instability)   . Atrophic vaginitis   . Anxiety   . Colon polyps   . H/O echocardiogram     a. Echo 11/2012:EF 50-55%, focal basal septal hypertrophy with some mitral valve SAM (mild) and MR.  This may be a sigmoid septum with advanced age or could be a variant of hypertrophic cardiomyopathy.  Marland Kitchen LBBB (left bundle branch block)   . Depression   . HTN (hypertension)   . Hyperlipidemia   . Chest pain     a. Adenosine sestamibi (1/14) with  no evidence of ischemia or infarction, EF 63%.   . Complete heart block 06-2013    status post pacemaker implantation by Dr Johney Frame 06-11-2013  . Migraines   . Prediabetes   . Vitamin D deficiency   . GERD (gastroesophageal reflux disease)     History + Hyplori via EGD  . Leukopenia 05/19/2014  . Lymphocytosis 05/19/2014   Past Surgical History  Procedure Laterality Date  . Breast surgery      Breast cyst  . Tubal ligation    .  Hemorrhoid surgery    . Pacemaker insertion  06-11-2013    STJ dual chamber pacemaker implanted by Dr Johney Frame for complete heart block  . Left heart catheterization with coronary angiogram N/A 06/11/2013    Procedure: LEFT HEART CATHETERIZATION WITH CORONARY ANGIOGRAM;  Surgeon: Kathleene Hazel, MD;  Location[facility] CATH LAB;  Service: Cardiovascular;  Laterality: N/A;  . Temporary pacemaker insertion N/A 06/11/2013    Procedure: TEMPORARY PACEMAKER INSERTION;  Surgeon: Kathleene Hazel, MD;  Location: [facility] CATH LAB;  Service: Cardiovascular;  Laterality: N/A;  . Permanent pacemaker insertion Left 06/11/2013    Procedure: PERMANENT PACEMAKER INSERTION;  Surgeon: Kathleene Hazel, MD;  Location: [facility] CATH LAB;  Service: Cardiovascular;  Laterality: Left;  . Permanent pacemaker insertion N/A 06/11/2013    Procedure: PERMANENT PACEMAKER INSERTION;  Surgeon: Hillis Range, MD;  Location: [facility] CATH LAB;  Service: Cardiovascular;  Laterality: N/A;    Review of Systems  Constitutional: Positive for malaise/fatigue. Negative for fever and chills.  HENT: Negative for congestion, ear pain, sore throat and tinnitus.   Respiratory: Negative for cough, shortness of breath and wheezing.   Cardiovascular: Negative for palpitations, claudication and leg swelling.  Gastrointestinal: Negative for heartburn, nausea, diarrhea, constipation, blood in stool and melena.  Genitourinary: Negative for dysuria, urgency and frequency.  Skin: Negative.   Neurological: Positive for headaches.  Negative for sensory change, focal weakness and loss of consciousness.  Psychiatric/Behavioral: Negative for depression. The patient is not nervous/anxious and does not have insomnia.      Objective:     BP 108/74 mmHg  Pulse 64  Temp(Src) 97.5 F (36.4 C)  Resp 16  Ht  (1.6 m)  Wt 154 lb (69.854 kg)  BMI 27.29 kg/m2  General Appearance: Well nourished, alert, WD/WN, female and in no apparent distress. Eyes: PERRLA, EOMs, conjunctiva no swelling or erythema, normal fundi and vessels. Sinuses: No frontal/maxillary tenderness ENT/Mouth: EACs patent / TMs  nl. Nares clear without erythema, swelling, mucoid exudates. Oral hygiene is good. No erythema, swelling, or exudate. Tongue normal, non-obstructing. Tonsils not swollen or erythematous. Hearing normal.  Neck: Supple, thyroid normal. No bruits, nodes or JVD. Respiratory: Respiratory effort normal.  BS equal and clear bilateral without rales, rhonci, wheezing or stridor. Cardio: Heart sounds with soft 2/6 murmur heard best on left sternal border with regular rate and rhythm and no rubs or gallops. Peripheral pulses are normal and equal bilaterally without edema. No aortic or femoral bruits. Chest: symmetric with normal excursions and percussion. S/p pacemaker Breasts: Symmetric, without lumps, nipple discharge, retractions, or fibrocystic changes.  Abdomen: Flat, soft  with nl bowel sounds. Nontender, no guarding, rebound, hernias, masses, or organomegaly.  Lymphatics: Non tender without lymphadenopathy.  Musculoskeletal: Full ROM all peripheral extremities, joint stability, 5/5 strength, and normal gait.  Tenderness over the occipital and mastoid processes.   Skin: Warm and dry without rashes, lesions, cyanosis, clubbing or  ecchymosis.  Neuro: Cranial nerves intact, reflexes equal bilaterally. Normal muscle tone, no cerebellar symptoms. Sensation intact.  Pysch: Alert and oriented X 3, normal affect, Insight and Judgment  appropriate.   Cognitive Testing  Alert? Yes  Normal Appearance?Yes  Oriented to person? Yes  Place? Yes   Time? Yes  Recall of three objects?  Yes  Can perform simple calculations? Yes  Displays appropriate judgment? Yes  Can read the correct time from a watch/clock?Yes  Medicare Attestation I have personally reviewed: The patient's medical and social history Their use of alcohol, tobacco or  illicit drugs Their current medications and supplements The patient's functional ability including ADLs,fall risks, home safety risks, cognitive, and hearing and visual impairment Diet and physical activities Evidence for depression or mood disorders  The patient's weight, height, BMI, and visual acuity have been recorded in the chart.  I have made referrals, counseling, and provided education to the patient based on review of the above and I have provided the patient with a written personalized care plan for preventive services.  Over 40 minutes of exam, counseling, chart review was performed.   Terri Piedra, PA-C   03/25/2015

## 2015-03-26 LAB — INSULIN, RANDOM: Insulin: 5 u[IU]/mL (ref 2.0–19.6)

## 2015-03-28 LAB — VITAMIN D 1,25 DIHYDROXY
Vitamin D 1, 25 (OH)2 Total: 39 pg/mL (ref 18–72)
Vitamin D3 1, 25 (OH)2: 39 pg/mL

## 2015-04-01 ENCOUNTER — Ambulatory Visit (HOSPITAL_COMMUNITY): Payer: Medicare PPO

## 2015-04-01 ENCOUNTER — Telehealth: Payer: Self-pay | Admitting: *Deleted

## 2015-04-01 ENCOUNTER — Ambulatory Visit (INDEPENDENT_AMBULATORY_CARE_PROVIDER_SITE_OTHER): Payer: Medicare PPO | Admitting: *Deleted

## 2015-04-01 DIAGNOSIS — I442 Atrioventricular block, complete: Secondary | ICD-10-CM

## 2015-04-01 LAB — CUP PACEART REMOTE DEVICE CHECK
Battery Remaining Percentage: 95.5 %
Brady Statistic AP VS Percent: 1 %
Brady Statistic AS VP Percent: 32 %
Brady Statistic AS VS Percent: 1 %
Brady Statistic RV Percent Paced: 99 %
Date Time Interrogation Session: 20160525060022
Lead Channel Impedance Value: 510 Ohm
Lead Channel Pacing Threshold Pulse Width: 0.4 ms
Lead Channel Pacing Threshold Pulse Width: 0.4 ms
Lead Channel Sensing Intrinsic Amplitude: 12 mV
Lead Channel Setting Pacing Amplitude: 1 V
Lead Channel Setting Pacing Pulse Width: 0.4 ms
Lead Channel Setting Sensing Sensitivity: 4 mV
MDC IDC MSMT BATTERY REMAINING LONGEVITY: 121 mo
MDC IDC MSMT BATTERY VOLTAGE: 2.99 V
MDC IDC MSMT LEADCHNL RA PACING THRESHOLD AMPLITUDE: 0.5 V
MDC IDC MSMT LEADCHNL RA SENSING INTR AMPL: 2.6 mV
MDC IDC MSMT LEADCHNL RV IMPEDANCE VALUE: 610 Ohm
MDC IDC MSMT LEADCHNL RV PACING THRESHOLD AMPLITUDE: 0.75 V
MDC IDC SET LEADCHNL RA PACING AMPLITUDE: 2 V
MDC IDC STAT BRADY AP VP PERCENT: 68 %
MDC IDC STAT BRADY RA PERCENT PACED: 67 %
Pulse Gen Model: 2240
Pulse Gen Serial Number: 7523768

## 2015-04-01 NOTE — Progress Notes (Signed)
Remote pacemaker transmission.   

## 2015-04-01 NOTE — Telephone Encounter (Signed)
New Message   Patient is calling because she does not think her pace checked to day. Please give patient a call.

## 2015-04-01 NOTE — Telephone Encounter (Signed)
Informed pt that transmission was received.  

## 2015-04-06 ENCOUNTER — Other Ambulatory Visit: Payer: Self-pay | Admitting: Hematology and Oncology

## 2015-04-06 DIAGNOSIS — D72819 Decreased white blood cell count, unspecified: Secondary | ICD-10-CM

## 2015-04-08 ENCOUNTER — Telehealth: Payer: Self-pay | Admitting: Hematology and Oncology

## 2015-04-08 NOTE — Telephone Encounter (Signed)
s.w. pt and advised on June appt....pt ok and aware °

## 2015-04-13 ENCOUNTER — Ambulatory Visit (HOSPITAL_COMMUNITY): Payer: Medicare PPO

## 2015-04-13 ENCOUNTER — Ambulatory Visit (HOSPITAL_COMMUNITY)
Admission: RE | Admit: 2015-04-13 | Discharge: 2015-04-13 | Disposition: A | Discharge status: Home / Self Care | Payer: Medicare PPO | Source: Ambulatory Visit | Attending: Internal Medicine | Admitting: Internal Medicine

## 2015-04-13 DIAGNOSIS — Z1231 Encounter for screening mammogram for malignant neoplasm of breast: Secondary | ICD-10-CM | POA: Diagnosis present

## 2015-04-15 ENCOUNTER — Encounter: Payer: Self-pay | Admitting: Cardiology

## 2015-04-21 ENCOUNTER — Encounter: Payer: Self-pay | Admitting: Internal Medicine

## 2015-04-21 DIAGNOSIS — M5136 Other intervertebral disc degeneration, lumbar region: Secondary | ICD-10-CM | POA: Diagnosis not present

## 2015-04-21 DIAGNOSIS — M5032 Other cervical disc degeneration, mid-cervical region: Secondary | ICD-10-CM | POA: Diagnosis not present

## 2015-04-22 ENCOUNTER — Telehealth: Payer: Self-pay | Admitting: Internal Medicine

## 2015-04-22 ENCOUNTER — Other Ambulatory Visit: Payer: Self-pay | Admitting: Internal Medicine

## 2015-04-22 MED ORDER — ALPRAZOLAM 1 MG PO TABS: ORAL_TABLET | ORAL | Status: DC

## 2015-04-22 NOTE — Telephone Encounter (Signed)
PLEASE FAX RENEWAL TO [facility]   ALPRAZolam (XANAX) 1 MG tablet [[id number]      Thank you, Theodoro Kalata Referral Coordinator  [facility] [phone] ext. [phone] Fax [phone]

## 2015-04-27 ENCOUNTER — Encounter: Payer: Self-pay | Admitting: Hematology and Oncology

## 2015-04-27 ENCOUNTER — Telehealth: Payer: Self-pay | Admitting: Hematology and Oncology

## 2015-04-27 ENCOUNTER — Ambulatory Visit (HOSPITAL_BASED_OUTPATIENT_CLINIC_OR_DEPARTMENT_OTHER): Payer: Medicare PPO | Admitting: Hematology and Oncology

## 2015-04-27 ENCOUNTER — Other Ambulatory Visit (HOSPITAL_BASED_OUTPATIENT_CLINIC_OR_DEPARTMENT_OTHER): Payer: Medicare PPO

## 2015-04-27 VITALS — BP 116/75 | HR 66 | Temp 98.4°F | Resp 18 | Ht 63.0 in | Wt 151.7 lb

## 2015-04-27 DIAGNOSIS — D72819 Decreased white blood cell count, unspecified: Secondary | ICD-10-CM

## 2015-04-27 LAB — CBC WITH DIFFERENTIAL/PLATELET
BASO%: 1.1 % (ref 0.0–2.0)
Basophils Absolute: 0.1 10*3/uL (ref 0.0–0.1)
EOS%: 1.5 % (ref 0.0–7.0)
Eosinophils Absolute: 0.1 10*3/uL (ref 0.0–0.5)
HEMATOCRIT: 41.8 % (ref 34.8–46.6)
HGB: 13.4 g/dL (ref 11.6–15.9)
LYMPH#: 2.1 10*3/uL (ref 0.9–3.3)
LYMPH%: 39.6 % (ref 14.0–49.7)
MCH: 27.7 pg (ref 25.1–34.0)
MCHC: 31.9 g/dL (ref 31.5–36.0)
MCV: 86.8 fL (ref 79.5–101.0)
MONO#: 0.4 10*3/uL (ref 0.1–0.9)
MONO%: 7.7 % (ref 0.0–14.0)
NEUT%: 50.1 % (ref 38.4–76.8)
NEUTROS ABS: 2.6 10*3/uL (ref 1.5–6.5)
Platelets: 194 10*3/uL (ref 145–400)
RBC: 4.82 10*6/uL (ref 3.70–5.45)
RDW: 13.5 % (ref 11.2–14.5)
WBC: 5.3 10*3/uL (ref 3.9–10.3)

## 2015-04-27 LAB — MORPHOLOGY
PLT EST: ADEQUATE
RBC Comments: NORMAL

## 2015-04-27 NOTE — Assessment & Plan Note (Signed)
Cause is unknown. The patient probably have constitutional leukopenia with intermittent bone marrow suppression from stress or low grade viral infection. This has resolved. I reassured the patient. No further workup is needed.  I will see her on a yearly basis.

## 2015-04-27 NOTE — Telephone Encounter (Signed)
Gave adn printed avs for pt °

## 2015-04-27 NOTE — Progress Notes (Signed)
[facility] OFFICE PROGRESS NOTE  [name],[name], MD SUMMARY OF HEMATOLOGIC HISTORY:  She was found to have abnormal CBC from routine blood work done at her physician's office. 3 months ago, her white blood cell count was low at 3.8 and recently at 2.4. She was asymptomatic and I recommended observation INTERVAL HISTORY: Alexandra Wheeler 74 y.o. female returns for further follow-up. She feels well. Denies recent infection.  I have reviewed the past medical history, past surgical history, social history and family history with the patient and they are unchanged from previous note.  ALLERGIES:  is allergic to actonel; boniva; evista; fosamax; levsin; mobic; topamax; and zoloft.  MEDICATIONS:  Current Outpatient Prescriptions  Medication Sig Dispense Refill  . ALPRAZolam (XANAX) 1 MG tablet 1/2 to 1 tab TID PRN for anxiety 270 tablet 1  . Ascorbic Acid (VITAMIN C PO) Take 1 tablet by mouth daily.     Marland Kitchen aspirin EC 81 MG tablet Take 81 mg by mouth daily.    Marland Kitchen BIOTIN PO Take 1 tablet by mouth daily.     . Calcium Carbonate-Vitamin D (CALCIUM + D PO) Take 1 tablet by mouth daily.     . Cholecalciferol (VITAMIN D PO) Take 5,000 Units by mouth daily.     . citalopram (CELEXA) 20 MG tablet Take 1 tablet (20 mg total) by mouth daily. 90 tablet 4  . FOLIC ACID PO Take 1 tablet by mouth daily.     . hydrocortisone (ANUSOL-HC) 2.5 % rectal cream Place 1 application rectally 2 (two) times daily. 30 g 0  . MAGNESIUM PO Take 500 mg by mouth daily.     . Multiple Vitamin (MULTIVITAMIN) tablet Take 1 tablet by mouth daily.    . Omega-3 Fatty Acids (FISH OIL PO) Take 1 capsule by mouth daily.     . Red Yeast Rice Extract (RED YEAST RICE PO) Take 1 tablet by mouth daily.     . Tretinoin, Facial Wrinkles, (TRETINOIN, EMOLLIENT,) 0.05 % CREA APPLY TO AFFECTED AREA DAILY AS DIRECTED 40 g 2  . hydrochlorothiazide (HYDRODIURIL) 25 MG tablet Take 25 mg by mouth as needed.    . ranitidine  (ZANTAC) 300 MG tablet Take 1 tablet (300 mg total) by mouth at bedtime. 90 tablet 1   No current facility-administered medications for this visit.     REVIEW OF SYSTEMS:   Constitutional: Denies fevers, chills or night sweats Eyes: Denies blurriness of vision Ears, nose, mouth, throat, and face: Denies mucositis or sore throat Respiratory: Denies cough, dyspnea or wheezes Cardiovascular: Denies palpitation, chest discomfort or lower extremity swelling Gastrointestinal:  Denies nausea, heartburn or change in bowel habits Skin: Denies abnormal skin rashes Lymphatics: Denies new lymphadenopathy or easy bruising Neurological:Denies numbness, tingling or new weaknesses Behavioral/Psych: Mood is stable, no new changes  All other systems were reviewed with the patient and are negative.  PHYSICAL EXAMINATION: ECOG PERFORMANCE STATUS: 0 - Asymptomatic  Filed Vitals:   04/27/15 1219  BP: 116/75  Pulse: 66  Temp: 98.4 F (36.9 C)  Resp: 18   Filed Weights   04/27/15 1219  Weight: 151 lb 11.2 oz (68.811 kg)    GENERAL:alert, no distress and comfortable SKIN: skin color, texture, turgor are normal, no rashes or significant lesions EYES: normal, Conjunctiva are pink and non-injected, sclera clear Musculoskeletal:no cyanosis of digits and no clubbing  NEURO: alert & oriented x 3 with fluent speech, no focal motor/sensory deficits  LABORATORY DATA:  I have reviewed the  data as listed Results for orders placed or performed in visit on 04/27/15 (from the past 48 hour(s))  CBC with Differential/Platelet     Status: None   Collection Time: 04/27/15 12:05 PM  Result Value Ref Range   WBC 5.3 3.9 - 10.3 10e3/uL   NEUT# 2.6 1.5 - 6.5 10e3/uL   HGB 13.4 11.6 - 15.9 g/dL   HCT 12.4 58.0 - 99.8 %   Platelets 194 145 - 400 10e3/uL   MCV 86.8 79.5 - 101.0 fL   MCH 27.7 25.1 - 34.0 pg   MCHC 31.9 31.5 - 36.0 g/dL   RBC 3.38 2.50 - 5.39 10e6/uL   RDW 13.5 11.2 - 14.5 %   lymph# 2.1 0.9 -  3.3 10e3/uL   MONO# 0.4 0.1 - 0.9 10e3/uL   Eosinophils Absolute 0.1 0.0 - 0.5 10e3/uL   Basophils Absolute 0.1 0.0 - 0.1 10e3/uL   NEUT% 50.1 38.4 - 76.8 %   LYMPH% 39.6 14.0 - 49.7 %   MONO% 7.7 0.0 - 14.0 %   EOS% 1.5 0.0 - 7.0 %   BASO% 1.1 0.0 - 2.0 %    Lab Results  Component Value Date   WBC 5.3 04/27/2015   HGB 13.4 04/27/2015   HCT 41.8 04/27/2015   MCV 86.8 04/27/2015   PLT 194 04/27/2015    ASSESSMENT & PLAN:  Leukopenia Cause is unknown. The patient probably have constitutional leukopenia with intermittent bone marrow suppression from stress or low grade viral infection. This has resolved. I reassured the patient. No further workup is needed.  I will see her on a yearly basis.   All questions were answered. The patient knows to call the clinic with any problems, questions or concerns. No barriers to learning was detected.  I spent 10 minutes counseling the patient face to face. The total time spent in the appointment was 15 minutes and more than 50% was on counseling.     [facility], Reyaan Thoma, MD 6/20/201612:34 PM

## 2015-06-08 ENCOUNTER — Telehealth: Payer: Self-pay | Admitting: *Deleted

## 2015-06-08 NOTE — Telephone Encounter (Signed)
Not available by phone.

## 2015-06-08 NOTE — Telephone Encounter (Signed)
Patient called and asked how to wean off Celexa.  Per Dr Oneta Rack, take 1/2 tab daily x 2 weeks and then 1/2 tab every other day x 2 weeks.  Patient aware.

## 2015-07-20 ENCOUNTER — Telehealth: Payer: Self-pay | Admitting: Internal Medicine

## 2015-07-20 DIAGNOSIS — R0600 Dyspnea, unspecified: Secondary | ICD-10-CM

## 2015-07-20 NOTE — Telephone Encounter (Signed)
Patient states that she has noticed an increased amount of DOE x 3 weeks. Patient denies any ShOB at rest, orthopnea, abdominal bloating, or high sodium diet. Patient states that she has chronic LE edema.  Remote reviewed. Normal device function per freeze and lead measurements. Histogram is appropriate for patient's activity level. 70 mode switch episodes recorded since 10/14/14---max dur. 8 mins (05/13/15), Max A 205 (05/22/15), last 07/15/15---AT per EGMs. Patient Vp >99%--chronic (implanted for CHB)---last echo 06/10/2013. No ventricular high rate episodes.  Informed patient that her device is functioning just as it should and that she should keep her appt with Dr. Johney Frame for 08/10/15. Patient voiced understanding.   Will forward note to Dr.Allred for any further recommendations

## 2015-07-20 NOTE — Telephone Encounter (Signed)
Patient c/o SOB on exertion for several weeks.  She st she "just hasn't felt right." She st she feels like she did before she got her pacemaker. She st she can barely walk without getting SOB. Patient is not in distress on the phone. She also c/o an intermittent "fluttering feeling" in her chest for 2 weeks.  She has no chest pain. She has no VS to report.  To [facility] and Dr. Johney Frame for recommendations.

## 2015-07-20 NOTE — Telephone Encounter (Signed)
Asked patient to send a remote. Verbal instructions given on how to do this. Patient voiced understanding.

## 2015-07-20 NOTE — Telephone Encounter (Signed)
NewMessage   Pt c/o Shortness Of Breath: STAT if SOB developed within the last 24 hours or pt is noticeably SOB on the phone  1. Are you currently SOB (can you hear that pt is SOB on the phone)? No,   2. How long have you been experiencing SOB? 3 weeks  3. Are you SOB when sitting or when up moving around? Up moving around  4. Are you currently experiencing any other symptoms? Hot flashes

## 2015-07-22 NOTE — Telephone Encounter (Signed)
Please order echo call to see if she needs earlier follow-up with gen cardiology NP/PA

## 2015-07-23 NOTE — Telephone Encounter (Signed)
Informed patient that Dr.Allred has recommended an echo. Patient voiced understanding. Will place order and have scheduling to call her with an appt.

## 2015-07-24 NOTE — Addendum Note (Signed)
Addended by: Sebastian Ache on: 07/24/2015 05:12 PM   Modules accepted: Orders

## 2015-07-24 NOTE — Telephone Encounter (Signed)
Forwarded to [employer] to schedule echo.

## 2015-07-27 ENCOUNTER — Telehealth (HOSPITAL_COMMUNITY): Payer: Self-pay | Admitting: *Deleted

## 2015-07-27 ENCOUNTER — Other Ambulatory Visit: Payer: Self-pay | Admitting: Internal Medicine

## 2015-07-27 MED ORDER — PREDNISONE 20 MG PO TABS: ORAL_TABLET | ORAL | Status: DC

## 2015-07-31 ENCOUNTER — Other Ambulatory Visit: Payer: Self-pay

## 2015-07-31 ENCOUNTER — Ambulatory Visit (HOSPITAL_COMMUNITY): Payer: Medicare PPO | Attending: Cardiology

## 2015-07-31 DIAGNOSIS — I071 Rheumatic tricuspid insufficiency: Secondary | ICD-10-CM | POA: Insufficient documentation

## 2015-07-31 DIAGNOSIS — I351 Nonrheumatic aortic (valve) insufficiency: Secondary | ICD-10-CM | POA: Insufficient documentation

## 2015-07-31 DIAGNOSIS — I5189 Other ill-defined heart diseases: Secondary | ICD-10-CM | POA: Diagnosis not present

## 2015-07-31 DIAGNOSIS — R0600 Dyspnea, unspecified: Secondary | ICD-10-CM

## 2015-07-31 DIAGNOSIS — I340 Nonrheumatic mitral (valve) insufficiency: Secondary | ICD-10-CM | POA: Diagnosis not present

## 2015-07-31 DIAGNOSIS — I517 Cardiomegaly: Secondary | ICD-10-CM | POA: Insufficient documentation

## 2015-08-10 ENCOUNTER — Encounter: Payer: Self-pay | Admitting: Internal Medicine

## 2015-08-10 ENCOUNTER — Other Ambulatory Visit: Payer: Self-pay | Admitting: Internal Medicine

## 2015-08-10 ENCOUNTER — Ambulatory Visit (INDEPENDENT_AMBULATORY_CARE_PROVIDER_SITE_OTHER): Payer: Medicare PPO | Admitting: Internal Medicine

## 2015-08-10 VITALS — BP 118/80 | HR 73 | Ht 65.0 in | Wt 153.8 lb

## 2015-08-10 DIAGNOSIS — I442 Atrioventricular block, complete: Secondary | ICD-10-CM | POA: Diagnosis not present

## 2015-08-10 DIAGNOSIS — I10 Essential (primary) hypertension: Secondary | ICD-10-CM | POA: Diagnosis not present

## 2015-08-10 LAB — CUP PACEART INCLINIC DEVICE CHECK
Battery Voltage: 2.99 V
Brady Statistic RA Percent Paced: 61 %
Brady Statistic RV Percent Paced: 99.92 %
Date Time Interrogation Session: 20161003112132
Lead Channel Impedance Value: 625 Ohm
Lead Channel Pacing Threshold Amplitude: 0.25 V
Lead Channel Pacing Threshold Amplitude: 0.25 V
Lead Channel Pacing Threshold Pulse Width: 0.4 ms
Lead Channel Pacing Threshold Pulse Width: 0.4 ms
Lead Channel Sensing Intrinsic Amplitude: 2.1 mV
Lead Channel Setting Pacing Amplitude: 2 V
Lead Channel Setting Pacing Pulse Width: 0.4 ms
Lead Channel Setting Sensing Sensitivity: 4 mV
MDC IDC MSMT BATTERY REMAINING LONGEVITY: 123.6 mo
MDC IDC MSMT LEADCHNL RA IMPEDANCE VALUE: 562.5 Ohm
MDC IDC MSMT LEADCHNL RA PACING THRESHOLD PULSEWIDTH: 0.4 ms
MDC IDC MSMT LEADCHNL RV PACING THRESHOLD AMPLITUDE: 1 V
MDC IDC MSMT LEADCHNL RV PACING THRESHOLD AMPLITUDE: 1 V
MDC IDC MSMT LEADCHNL RV PACING THRESHOLD PULSEWIDTH: 0.4 ms
MDC IDC MSMT LEADCHNL RV SENSING INTR AMPL: 12 mV
MDC IDC PG SERIAL: 7523768
MDC IDC SET LEADCHNL RV PACING AMPLITUDE: 1.125

## 2015-08-10 NOTE — Patient Instructions (Addendum)
Medication Instructions:  Your physician recommends that you continue on your current medications as directed. Please refer to the Current Medication list given to you today.   Labwork: None ordered   Testing/Procedures: None ordered   Follow-Up:  Your physician recommends that you schedule a follow-up appointment with Dr Shirlee Latch next available to follow up on her HOCM  Remote monitoring is used to monitor your Pacemaker  from home. This monitoring reduces the number of office visits required to check your device to one time per year. It allows [place] to keep an eye on the functioning of your device to ensure it is working properly. You are scheduled for a device check from home on 11/10/15. You may send your transmission at any time that day. If you have a wireless device, the transmission will be sent automatically. After your physician reviews your transmission, you will receive a postcard with your next transmission date.  Your physician wants you to follow-up in: 12 months with Gypsy Balsam, NP You will receive a reminder letter in the mail two months in advance. If you don't receive a letter, please call our office to schedule the follow-up appointment.     Any Other Special Instructions Will Be Listed Below (If Applicable).

## 2015-08-11 NOTE — Progress Notes (Signed)
PCP: Nadean Corwin, MD Primary Cardiologist:  Dr Suszanne Conners is a 74 y.o. female who presents today for routine electrophysiology followup.  Since her last visit, the patient reports doing reasonably well.  She has had more difficulty with exertional SOB.   Today, she denies symptoms of palpitations, chest pain, lower extremity edema,  or syncope.  The patient is otherwise without complaint today.   Past Medical History  Diagnosis Date  . Osteoporosis   . DI (detrusor instability)   . Atrophic vaginitis   . Anxiety   . Colon polyps   . H/O echocardiogram     a. Echo 11/2012:EF 50-55%, focal basal septal hypertrophy with some mitral valve SAM (mild) and MR.  This may be a sigmoid septum with advanced age or could be a variant of hypertrophic cardiomyopathy.  Marland Kitchen LBBB (left bundle branch block)   . Depression   . HTN (hypertension)   . Hyperlipidemia   . Chest pain     a. Adenosine sestamibi (1/14) with no evidence of ischemia or infarction, EF 63%.   . Complete heart block (HCC) 06-2013    status post pacemaker implantation by Dr Johney Frame 06-11-2013  . Migraines   . Prediabetes   . Vitamin D deficiency   . GERD (gastroesophageal reflux disease)     History + Hyplori via EGD  . Leukopenia 05/19/2014  . Lymphocytosis 05/19/2014   Past Surgical History  Procedure Laterality Date  . Breast surgery      Breast cyst  . Tubal ligation    . Hemorrhoid surgery    . Pacemaker insertion  06-11-2013    STJ dual chamber pacemaker implanted by Dr Johney Frame for complete heart block  . Left heart catheterization with coronary angiogram N/A 06/11/2013    Procedure: LEFT HEART CATHETERIZATION WITH CORONARY ANGIOGRAM;  Surgeon: Kathleene Hazel, MD;  Location: [facility] CATH LAB;  Service: Cardiovascular;  Laterality: N/A;  . Temporary pacemaker insertion N/A 06/11/2013    Procedure: TEMPORARY PACEMAKER INSERTION;  Surgeon: Kathleene Hazel, MD;  Location: [facility] CATH LAB;  Service:  Cardiovascular;  Laterality: N/A;  . Permanent pacemaker insertion Left 06/11/2013    Procedure: PERMANENT PACEMAKER INSERTION;  Surgeon: Kathleene Hazel, MD;  Location: [facility] CATH LAB;  Service: Cardiovascular;  Laterality: Left;  . Permanent pacemaker insertion N/A 06/11/2013    Procedure: PERMANENT PACEMAKER INSERTION;  Surgeon: Hillis Range, MD;  Location: [facility] CATH LAB;  Service: Cardiovascular;  Laterality: N/A;    Current Outpatient Prescriptions  Medication Sig Dispense Refill  . ALPRAZolam (XANAX) 1 MG tablet Take 0.5-1 mg by mouth 3 (three) times daily as needed for anxiety.    . Ascorbic Acid (VITAMIN C PO) Take 1 tablet by mouth daily.     Marland Kitchen aspirin EC 81 MG tablet Take 81 mg by mouth daily.    Marland Kitchen BIOTIN PO Take 1 tablet by mouth daily.     . Calcium Carbonate-Vitamin D (CALCIUM + D PO) Take 1 tablet by mouth daily.     . Cholecalciferol (VITAMIN D PO) Take 5,000 Units by mouth daily.     . Coenzyme Q10 (CO Q 10 PO) Take 1 capsule by mouth daily.    Marland Kitchen FOLIC ACID PO Take 1 tablet by mouth daily.     . hydrochlorothiazide (HYDRODIURIL) 25 MG tablet Take 25 mg by mouth daily as needed (swelling).     . hydrocortisone (ANUSOL-HC) 2.5 % rectal cream Place 1 application rectally 2 (two) times daily. 30  g 0  . MAGNESIUM PO Take 500 mg by mouth daily.     . Multiple Vitamin (MULTIVITAMIN) tablet Take 1 tablet by mouth daily.    . Omega-3 Fatty Acids (FISH OIL PO) Take 1 capsule by mouth daily.     . Tretinoin, Facial Wrinkles, (TRETINOIN, EMOLLIENT,) 0.05 % CREA APPLY TO AFFECTED AREA DAILY AS DIRECTED 40 g 2  . ranitidine (ZANTAC) 300 MG tablet Take 1 tablet (300 mg total) by mouth at bedtime. 90 tablet 1   No current facility-administered medications for this visit.    Physical Exam: Filed Vitals:   08/10/15 1103  BP: 118/80  Pulse: 73  Height:  (1.651 m)  Weight: 153 lb 12.8 oz (69.763 kg)    GEN- The patient is well appearing, alert and oriented x 3 today.   Head-  normocephalic, atraumatic Eyes-  Sclera clear, conjunctiva pink Ears- hearing intact Oropharynx- clear Lungs- Clear to ausculation bilaterally, normal work of breathing Chest- pacemaker pocket is well healed Heart- Regular rate and rhythm, 2/6 SEM LUSB GI- soft, NT, ND, + BS Extremities- no clubbing, cyanosis, or edema  Pacemaker interrogation- reviewed in detail today,  See PACEART report  Assessment and Plan:  1. Complete heart block Normal pacemaker function See Pace Art report No changes today  2. HTN Stable No change required today  3. SOB Echo is reviewed Likely exacerbated by hypertrophic CM Will refer back to Dr Shirlee Latch for his opinion  Merlin Return to see EP NP in 1 year

## 2015-08-21 ENCOUNTER — Telehealth: Payer: Self-pay | Admitting: Internal Medicine

## 2015-08-21 NOTE — Telephone Encounter (Signed)
Spoke with patient and let her know Dr Johney FrameAllred wants her to see Dr Shirlee LatchMcLean for HOCM.  She will be called for the appointment

## 2015-08-21 NOTE — Telephone Encounter (Signed)
New Message    Pt wants Rn to call her back she wants to know if Dr. Ellin GoodieAllres is concerned enough for her to come in   She states that he is aware

## 2015-08-25 ENCOUNTER — Telehealth: Payer: Self-pay | Admitting: Cardiology

## 2015-08-25 NOTE — Telephone Encounter (Signed)
Follow up  Alexandra Wheeler called back. Alexandra Wheeler would also like to see Dr. Eden EmmsNishan as her Cardiologist moving forward.

## 2015-08-25 NOTE — Telephone Encounter (Signed)
Follow Up ° °Pt returned call//  °

## 2015-08-25 NOTE — Telephone Encounter (Signed)
Left message to call back  

## 2015-08-25 NOTE — Telephone Encounter (Signed)
For Hypertrophic cardiomyopathy patients, I like to see them in CHF clinic.  She should be able to get in with me there in the next couple of weeks.  Please arrange.

## 2015-08-25 NOTE — Telephone Encounter (Signed)
New Message      Pt calling to f/u on previous conversation w/ Tresa EndoKelly about the fact that Dr. Johney FrameAllred wants pt to see Dr. Shirlee LatchMcLean as soon as possible. Pt notified that Thurston Holenne is not here today. Please call pt back and advise.

## 2015-08-25 NOTE — Telephone Encounter (Addendum)
Pt states that at last OV with Dr. Johney FrameAllred he had mentioned that he would like for her to see Dr. Shirlee LatchMcLean soon for her HOCM. Dr. Alford HighlandMcLean's next available is January 2017 and Dr. Jenel LucksAllred's nurse states that she really needs to be seen sooner. Advised pt that Dr. Alford HighlandMcLean's nurse is not in the office this week and we would have to work out a time for her to see Dr. Shirlee LatchMcLean. Pt states that she seen Dr. Eden EmmsNishan in the hospital and she wouldn't mind switching to Dr. Eden EmmsNishan if the physicians were agreeable. Pt said if Dr. Shirlee LatchMcLean can see her then she is fine with seeing him as well. Advised pt that I will route this information to Dr. Shirlee LatchMcLean to advise on possible time he can see pt in our office or if he is ok with pt changing physicians.

## 2015-08-25 NOTE — Telephone Encounter (Signed)
Spoke with pt and informed her that Dr. Shirlee LatchMcLean would like to see her in the CHF clinic. Advised pt that a nurse from the CHF clinic would contact her to schedule the appt. Pt verbalized understanding and was appreciative for call back.

## 2015-08-26 ENCOUNTER — Telehealth (HOSPITAL_COMMUNITY): Payer: Self-pay | Admitting: Vascular Surgery

## 2015-08-26 NOTE — Telephone Encounter (Signed)
Attempting to contact pt to schedule appointment: left message

## 2015-08-26 NOTE — Telephone Encounter (Signed)
Left message to make pt appt w/ mclean

## 2015-08-27 NOTE — Telephone Encounter (Signed)
appt 11/14

## 2015-09-01 ENCOUNTER — Ambulatory Visit (INDEPENDENT_AMBULATORY_CARE_PROVIDER_SITE_OTHER): Payer: Medicare PPO | Admitting: Internal Medicine

## 2015-09-01 ENCOUNTER — Encounter: Payer: Self-pay | Admitting: Internal Medicine

## 2015-09-01 VITALS — BP 126/84 | HR 64 | Temp 97.7°F | Resp 16 | Ht 64.5 in | Wt 156.2 lb

## 2015-09-01 DIAGNOSIS — R6889 Other general symptoms and signs: Secondary | ICD-10-CM

## 2015-09-01 DIAGNOSIS — I10 Essential (primary) hypertension: Secondary | ICD-10-CM | POA: Diagnosis not present

## 2015-09-01 DIAGNOSIS — Z789 Other specified health status: Secondary | ICD-10-CM

## 2015-09-01 DIAGNOSIS — M5481 Occipital neuralgia: Secondary | ICD-10-CM | POA: Diagnosis not present

## 2015-09-01 DIAGNOSIS — Z79899 Other long term (current) drug therapy: Secondary | ICD-10-CM

## 2015-09-01 DIAGNOSIS — Z1389 Encounter for screening for other disorder: Secondary | ICD-10-CM | POA: Diagnosis not present

## 2015-09-01 DIAGNOSIS — R7309 Other abnormal glucose: Secondary | ICD-10-CM | POA: Diagnosis not present

## 2015-09-01 DIAGNOSIS — Z6826 Body mass index (BMI) 26.0-26.9, adult: Secondary | ICD-10-CM

## 2015-09-01 DIAGNOSIS — Z1212 Encounter for screening for malignant neoplasm of rectum: Secondary | ICD-10-CM

## 2015-09-01 DIAGNOSIS — E559 Vitamin D deficiency, unspecified: Secondary | ICD-10-CM

## 2015-09-01 DIAGNOSIS — E785 Hyperlipidemia, unspecified: Secondary | ICD-10-CM

## 2015-09-01 DIAGNOSIS — Z1331 Encounter for screening for depression: Secondary | ICD-10-CM

## 2015-09-01 DIAGNOSIS — Z23 Encounter for immunization: Secondary | ICD-10-CM

## 2015-09-01 DIAGNOSIS — R7303 Prediabetes: Secondary | ICD-10-CM | POA: Diagnosis not present

## 2015-09-01 DIAGNOSIS — Z9181 History of falling: Secondary | ICD-10-CM

## 2015-09-01 DIAGNOSIS — Z0001 Encounter for general adult medical examination with abnormal findings: Secondary | ICD-10-CM | POA: Diagnosis not present

## 2015-09-01 DIAGNOSIS — E663 Overweight: Secondary | ICD-10-CM | POA: Insufficient documentation

## 2015-09-01 LAB — TSH: TSH: 1.716 u[IU]/mL (ref 0.350–4.500)

## 2015-09-01 MED ORDER — BUTALBITAL-APAP-CAFFEINE 50-325-40 MG PO CAPS
ORAL_CAPSULE | ORAL | Status: DC
Start: 2015-09-01 — End: 2015-09-21

## 2015-09-01 NOTE — Progress Notes (Addendum)
Patient ID: Alexandra Wheeler, female   DOB: 18-Aug-1941, 74 y.o.   MRN: [id number]   Medicare Annual Preventative Visit And Comprehensive Evaluation,  Examination & Management     This very nice 74 y.o. [facility] presents for  presents for a Medicare Preventative Visit & comprehensive evaluation and management of multiple medical co-morbidities.  Patient has been followed for HTN, Prediabetes, Hyperlipidemia, GERD and Vitamin D Deficiency. Patient's GERD is controlled with prudent diet and Ranitidine.      Today patient's main complaint is of right posterior scalp and right neck pain which she describes as a "headache" and is always present to a greater or lesser degree. Has been receiving EDSI's from Dr Ethelene Hal in the left neck and low back for "swollen disks". Denies any sinus sx's, neuro or visual sx's, relates the severity as occassionally up to 7-8/10, and occas prevents sleep or awakens from sleep.       Patient has been followed a number of years circa  [phone] expectantly with labile HTN taking HCTZ on a prn basis to control her dependent edema.  In Aug 2014 during a routine CPE her EKG showed CHB and she ultimately had a permanent pacemaker implanted. Heart cath at that time was normal. Patient is followed by Dr Eden Emms. Patient's BP has been controlled at home and patient denies any cardiac symptoms as chest pain, palpitations, shortness of breath, dizziness or recent ankle swelling. Today's BP: 126/84 mmHg      Patient's hyperlipidemia is controlled with diet. Last lipids were at goal with Cholesterol 143; HDL 57; LDL 74; Triglycerides 60 on 03/25/2015.     Patient has prediabetes predating since Feb 2011 with A1c 5.9% and patient denies reactive hypoglycemic symptoms, visual blurring, diabetic polys, or paresthesias. Last A1c was still 5.9% on 03/25/2015.     Finally, patient has history of Vitamin D Deficiency of 34 in 2008  and last Vitamin D was 104 in Oct 2015.      Medication Sig  . ALPRAZolam   1 MG tablet Take 0.5-1 mg by mouth 3 (three) times daily as needed for anxiety.  Marland Kitchen VITAMIN C  Take 1 tablet by mouth daily.   Marland Kitchen aspirin EC 81 MG tablet Take 81 mg by mouth daily.  Marland Kitchen BIOTIN PO Take 1 tablet by mouth daily.   Marland Kitchen CALCIUM + D  Take 1 tablet by mouth daily.   Marland Kitchen VITAMIN D  Take 5,000 Units by mouth daily.   . Coenzyme Q10 Take 1 capsule by mouth daily.  Marland Kitchen FOLIC ACID  Take 1 tablet by mouth daily.   . hctz 25 MG tablet Take 25 mg by mouth daily as needed (swelling).   . hydrocortisone (ANUSOL-HC) 2.5 % rectal cream Place 1 application rectally 2 (two) times daily.  Marland Kitchen MAGNESIUM PO Take 500 mg by mouth daily.   . Multiple Vitamin  Take 1 tablet by mouth daily.  Marland Kitchen FISH OIL Take 1 capsule by mouth daily.   . ranitidine 300 MG tablet Take 1 tablet (300 mg total) by mouth at bedtime.  . Tretinoin  0.05 % CREA APPLY TO AFFECTED AREA DAILY AS DIRECTED   Allergies  Allergen Reactions  . Actonel [Risedronate Sodium]     Pt didn't like the way it made her feel   . Boniva [Ibandronic Acid]     Pt didn't like the way it made her feel  . Evista [Raloxifene]     Pt didn't like the way it made her  feel  . Fosamax [Alendronate Sodium]     Pt didn't like the way it made her feel   . Levsin [Hyoscyamine Sulfate]     unknown  . Mobic [Meloxicam]     unknown   . Topamax [Topiramate]     unknown   . Zoloft [Sertraline Hcl]     unknown    Past Medical History  Diagnosis Date  . Osteoporosis   . DI (detrusor instability)   . Atrophic vaginitis   . Anxiety   . Colon polyps   . H/O echocardiogram     a. Echo 11/2012:EF 50-55%, focal basal septal hypertrophy with some mitral valve SAM (mild) and MR.  This may be a sigmoid septum with advanced age or could be a variant of hypertrophic cardiomyopathy.  . LBBB (left bundle branch block)   . Depression   . HTN (hypertension)   . HyperliMarland Kitchenpidemia   . Chest pain     a. Adenosine sestamibi (1/14) with no evidence of ischemia or infarction,  EF 63%.   . Complete heart block (HCC) 06-2013    status post pacemaker implantation by Dr Johney Frame 06-11-2013  . Migraines   . Prediabetes   . Vitamin D deficiency   . GERD (gastroesophageal reflux disease)     History + Hyplori via EGD  . Leukopenia 05/19/2014  . Lymphocytosis 05/19/2014   Health Maintenance  Topic Date Due  . PNA vac Low Risk Adult (2 of 2 - PCV13) 10/09/2011  . INFLUENZA VACCINE  06/08/2015  . MAMMOGRAM  04/12/2017  . TETANUS/TDAP  05/08/2021  . COLONOSCOPY  11/12/2023  . DEXA SCAN  Completed  . ZOSTAVAX  Completed   Immunization History  Administered Date(s) Administered  . Influenza Split 08/06/2014  . Influenza, High Dose Seasonal PF 09/01/2015  . Pneumococcal Conjugate-13 09/01/2015  . Pneumococcal-Unspecified 10/08/2010  . Td 05/09/2011  . Zoster 05/09/2011   Past Surgical History  Procedure Laterality Date  . Breast surgery      Breast cyst  . Tubal ligation    . Hemorrhoid surgery    . Pacemaker insertion  06-11-2013    STJ dual chamber pacemaker implanted by Dr Johney Frame for complete heart block  . Left heart catheterization with coronary angiogram N/A 06/11/2013    Procedure: LEFT HEART CATHETERIZATION WITH CORONARY ANGIOGRAM;  Surgeon: Kathleene Hazel, MD;  Location: [facility] CATH LAB;  Service: Cardiovascular;  Laterality: N/A;  . Temporary pacemaker insertion N/A 06/11/2013    Procedure: TEMPORARY PACEMAKER INSERTION;  Surgeon: Kathleene Hazel, MD;  Location: [facility] CATH LAB;  Service: Cardiovascular;  Laterality: N/A;  . Permanent pacemaker insertion Left 06/11/2013    Procedure: PERMANENT PACEMAKER INSERTION;  Surgeon: Kathleene Hazel, MD;  Location: [facility] CATH LAB;  Service: Cardiovascular;  Laterality: Left;  . Permanent pacemaker insertion N/A 06/11/2013    Procedure: PERMANENT PACEMAKER INSERTION;  Surgeon: Hillis Range, MD;  Location: [facility] CATH LAB;  Service: Cardiovascular;  Laterality: N/A;   Family History  Problem Relation Age of Onset  .  Diabetes Mother   . Heart disease Mother   . Diabetes Brother   . Hypertension Brother   . Heart disease Brother   . Breast cancer Sister 8  . Aneurysm Brother   . Stroke Brother   . Cancer Father     prostate  . Diabetes Brother   . Heart disease Brother    Social History  Substance Use Topics  . Smoking status: Former Smoker    Types: Cigarettes  Quit date: 10/09/1975  . Smokeless tobacco: Never Used  . Alcohol Use: No    ROS Constitutional: Denies fever, chills, weight loss/gain, headaches, insomnia,  night sweats, and change in appetite. Does c/o fatigue. Eyes: Denies redness, blurred vision, diplopia, discharge, itchy, watery eyes.  ENT: Denies discharge, congestion, post nasal drip, epistaxis, sore throat, earache, hearing loss, dental pain, Tinnitus, Vertigo, Sinus pain, snoring.  Cardio: Denies chest pain, palpitations, irregular heartbeat, syncope, dyspnea, diaphoresis, orthopnea, PND, claudication, edema Respiratory: denies cough, dyspnea, DOE, pleurisy, hoarseness, laryngitis, wheezing.  Gastrointestinal: Denies dysphagia, heartburn, reflux, water brash, pain, cramps, nausea, vomiting, bloating, diarrhea, constipation, hematemesis, melena, hematochezia, jaundice, hemorrhoids Genitourinary: Denies dysuria, frequency, urgency, nocturia, hesitancy, discharge, hematuria, flank pain Breast: Breast lumps, nipple discharge, bleeding.  Musculoskeletal: Denies arthralgia, myalgia, stiffness, Jt. Swelling, pain, limp, and strain/sprain. Denies falls. Skin: Denies puritis, rash, hives, warts, acne, eczema, changing in skin lesion Neuro: No weakness, tremor, incoordination, spasms, paresthesia, pain Psychiatric: Denies confusion, memory loss, sensory loss. Denies Depression. Endocrine: Denies change in weight, skin, hair change, nocturia, and paresthesia, diabetic polys, visual blurring, hyper / hypo glycemic episodes.  Heme/Lymph: No excessive bleeding, bruising, enlarged lymph  nodes.  Physical Exam  BP 126/84 mmHg  Pulse 64  Temp(Src) 97.7 F (36.5 C)  Resp 16  Ht 5' 4.5" (1.638 m)  Wt 156 lb 3.2 oz (70.852 kg)  BMI 26.41 kg/m2  General Appearance: Well nourished and in no apparent distress. Eyes: PERRLA, EOMs, conjunctiva no swelling or erythema, normal fundi and vessels. Sinuses: No frontal/maxillary tenderness ENT/Mouth: EACs patent / TMs  nl. Nares clear without erythema, swelling, mucoid exudates. Oral hygiene is good. No erythema, swelling, or exudate. Tongue normal, non-obstructing. Tonsils not swollen or erythematous. Hearing normal.  Neck: Supple, thyroid normal. No bruits, nodes or JVD. Respiratory: Respiratory effort normal.  BS equal and clear bilateral without rales, rhonci, wheezing or stridor. Cardio: Heart sounds are normal with regular rate and rhythm and no murmurs, rubs or gallops. Peripheral pulses are normal and equal bilaterally without edema. No aortic or femoral bruits. Chest: symmetric with normal excursions and percussion. Breasts: Symmetric, without lumps, nipple discharge, retractions, or fibrocystic changes.  Abdomen: Flat, soft, with bowel sounds. Nontender, no guarding, rebound, hernias, masses, or organomegaly.  Lymphatics: Non tender without lymphadenopathy.  Genitourinary:  Musculoskeletal: Full ROM all peripheral extremities, joint stability, 5/5 strength, and normal gait. Skin: Warm and dry without rashes, lesions, cyanosis, clubbing or  ecchymosis.  Neuro: Cranial nerves intact, reflexes equal bilaterally. Normal muscle tone, no cerebellar symptoms. Sensation intact by Monofilament testing to the toes bilaterally.  Pysch: Alert and oriented X 3, normal affect, Insight and Judgment appropriate.   Assessment and Plan  1. Encounter for general adult medical examination with abnormal findings   2. Essential hypertension  - EKG 12-Lead - [place], RETROPERITNL ABD,  LTD - TSH  3. Hyperlipidemia  - Lipid panel  4.  Prediabetes  - Hemoglobin A1c - Insulin, random  5. Vitamin D deficiency  - Vit D  25 hydroxy   6. Occipital neuralgia of right side - ? Occipital Neuralgia  - Ambulatory referral to Neurology - Dr Santiago Glad  - Butalbital-APAP-Caffeine (CAPACET) 50-325-40 MG capsule; Take 1 to 2 caposules 4 x day if needed for pain  Dispense: 30 capsule; Refill: 1  7. Screening for rectal cancer  - POC Hemoccult Bld/Stl   8. At low risk for fall   9. BMI 26+,  adult   10. Depression screen   11. Need  for prophylactic vaccination against Streptococcus pneumoniae (pneumococcus)  - Pneumococcal conjugate vaccine 13-valent  12. Need for prophylactic vaccination and inoculation against influenza  - Flu vaccine HIGH DOSE PF (Fluzone High dose)  13. Medication management  - Microalbumin / creatinine urine ratio - Urinalysis, Routine w reflex microscopic  - CBC with Differential/Platelet - BASIC METABOLIC PANEL WITH GFR - Hepatic function panel - Magnesium   Continue prudent diet as discussed, weight control, BP monitoring, regular exercise, and medications. Discussed med's effects and SE's. Screening labs and tests as requested with regular follow-up as recommended.  Over 40 minutes of exam, counseling, chart review was performed.

## 2015-09-01 NOTE — Patient Instructions (Signed)

## 2015-09-02 LAB — BASIC METABOLIC PANEL WITH GFR
BUN: 18 mg/dL (ref 7–25)
CALCIUM: 9.3 mg/dL (ref 8.6–10.4)
CO2: 28 mmol/L (ref 20–31)
Chloride: 104 mmol/L (ref 98–110)
Creat: 0.74 mg/dL (ref 0.60–0.93)
GFR, EST NON AFRICAN AMERICAN: 80 mL/min (ref >=60)
Glucose, Bld: 87 mg/dL (ref 65–99)
POTASSIUM: 4.4 mmol/L (ref 3.5–5.3)
SODIUM: 143 mmol/L (ref 135–146)

## 2015-09-02 LAB — URINALYSIS, ROUTINE W REFLEX MICROSCOPIC
BILIRUBIN URINE: NEGATIVE
Glucose, UA: NEGATIVE
HGB URINE DIPSTICK: NEGATIVE
KETONES UR: NEGATIVE
Leukocytes, UA: NEGATIVE
NITRITE: NEGATIVE
PROTEIN: NEGATIVE
SPECIFIC GRAVITY, URINE: 1.017 (ref 1.001–1.035)
pH: 7.5 (ref 5.0–8.0)

## 2015-09-02 LAB — HEPATIC FUNCTION PANEL
ALBUMIN: 3.9 g/dL (ref 3.6–5.1)
ALK PHOS: 74 U/L (ref 33–130)
ALT: 15 U/L (ref 6–29)
AST: 18 U/L (ref 10–35)
Bilirubin, Direct: 0.1 mg/dL (ref <=0.2)
Indirect Bilirubin: 0.2 mg/dL (ref 0.2–1.2)
TOTAL PROTEIN: 6.3 g/dL (ref 6.1–8.1)
Total Bilirubin: 0.3 mg/dL (ref 0.2–1.2)

## 2015-09-02 LAB — CBC WITH DIFFERENTIAL/PLATELET
BASOS ABS: 0 10*3/uL (ref 0.0–0.1)
BASOS PCT: 1 % (ref 0–1)
EOS ABS: 0.1 10*3/uL (ref 0.0–0.7)
EOS PCT: 4 % (ref 0–5)
HCT: 39 % (ref 36.0–46.0)
Hemoglobin: 12.8 g/dL (ref 12.0–15.0)
LYMPHS PCT: 56 % — AB (ref 12–46)
Lymphs Abs: 1.7 10*3/uL (ref 0.7–4.0)
MCH: 27.9 pg (ref 26.0–34.0)
MCHC: 32.8 g/dL (ref 30.0–36.0)
MCV: 85.2 fL (ref 78.0–100.0)
MONO ABS: 0.3 10*3/uL (ref 0.1–1.0)
MPV: 9.9 fL (ref 8.6–12.4)
Monocytes Relative: 10 % (ref 3–12)
Neutro Abs: 0.9 10*3/uL — ABNORMAL LOW (ref 1.7–7.7)
Neutrophils Relative %: 29 % — ABNORMAL LOW (ref 43–77)
PLATELETS: 216 10*3/uL (ref 150–400)
RBC: 4.58 MIL/uL (ref 3.87–5.11)
RDW: 14.3 % (ref 11.5–15.5)
WBC: 3 10*3/uL — AB (ref 4.0–10.5)

## 2015-09-02 LAB — HEMOGLOBIN A1C
Hgb A1c MFr Bld: 5.8 % — ABNORMAL HIGH (ref <5.7)
MEAN PLASMA GLUCOSE: 120 mg/dL — AB (ref <117)

## 2015-09-02 LAB — MICROALBUMIN / CREATININE URINE RATIO
CREATININE, URINE: 70 mg/dL (ref 20–320)
MICROALB/CREAT RATIO: 14 ug/mg{creat} (ref <30)
Microalb, Ur: 1 mg/dL

## 2015-09-02 LAB — LIPID PANEL
Cholesterol: 147 mg/dL (ref 125–200)
HDL: 57 mg/dL (ref >=46)
LDL CALC: 78 mg/dL (ref <130)
Total CHOL/HDL Ratio: 2.6 Ratio (ref <=5.0)
Triglycerides: 59 mg/dL (ref <150)
VLDL: 12 mg/dL (ref <30)

## 2015-09-02 LAB — INSULIN, RANDOM: INSULIN: 8.7 u[IU]/mL (ref 2.0–19.6)

## 2015-09-02 LAB — VITAMIN D 25 HYDROXY (VIT D DEFICIENCY, FRACTURES): Vit D, 25-Hydroxy: 80 ng/mL (ref 30–100)

## 2015-09-02 LAB — MAGNESIUM: Magnesium: 2.4 mg/dL (ref 1.5–2.5)

## 2015-09-21 ENCOUNTER — Other Ambulatory Visit: Payer: Self-pay | Admitting: *Deleted

## 2015-09-21 ENCOUNTER — Encounter (HOSPITAL_COMMUNITY): Payer: Self-pay

## 2015-09-21 ENCOUNTER — Ambulatory Visit (HOSPITAL_COMMUNITY)
Admission: RE | Admit: 2015-09-21 | Discharge: 2015-09-21 | Disposition: A | Discharge status: Home / Self Care | Payer: Medicare PPO | Source: Ambulatory Visit | Attending: Cardiology | Admitting: Cardiology

## 2015-09-21 VITALS — BP 102/70 | HR 75 | Wt 154.5 lb

## 2015-09-21 DIAGNOSIS — Z950 Presence of cardiac pacemaker: Secondary | ICD-10-CM | POA: Insufficient documentation

## 2015-09-21 DIAGNOSIS — R0789 Other chest pain: Secondary | ICD-10-CM | POA: Insufficient documentation

## 2015-09-21 DIAGNOSIS — I10 Essential (primary) hypertension: Secondary | ICD-10-CM | POA: Diagnosis not present

## 2015-09-21 DIAGNOSIS — Z7982 Long term (current) use of aspirin: Secondary | ICD-10-CM | POA: Insufficient documentation

## 2015-09-21 DIAGNOSIS — Z87891 Personal history of nicotine dependence: Secondary | ICD-10-CM | POA: Insufficient documentation

## 2015-09-21 DIAGNOSIS — I499 Cardiac arrhythmia, unspecified: Secondary | ICD-10-CM | POA: Diagnosis not present

## 2015-09-21 DIAGNOSIS — I442 Atrioventricular block, complete: Secondary | ICD-10-CM | POA: Diagnosis not present

## 2015-09-21 DIAGNOSIS — I422 Other hypertrophic cardiomyopathy: Secondary | ICD-10-CM | POA: Insufficient documentation

## 2015-09-21 DIAGNOSIS — Z79899 Other long term (current) drug therapy: Secondary | ICD-10-CM | POA: Diagnosis not present

## 2015-09-21 DIAGNOSIS — K219 Gastro-esophageal reflux disease without esophagitis: Secondary | ICD-10-CM | POA: Insufficient documentation

## 2015-09-21 DIAGNOSIS — E785 Hyperlipidemia, unspecified: Secondary | ICD-10-CM | POA: Insufficient documentation

## 2015-09-21 DIAGNOSIS — I421 Obstructive hypertrophic cardiomyopathy: Secondary | ICD-10-CM

## 2015-09-21 DIAGNOSIS — Z8249 Family history of ischemic heart disease and other diseases of the circulatory system: Secondary | ICD-10-CM | POA: Diagnosis not present

## 2015-09-21 DIAGNOSIS — Z1212 Encounter for screening for malignant neoplasm of rectum: Secondary | ICD-10-CM

## 2015-09-21 LAB — POC HEMOCCULT BLD/STL (HOME/3-CARD/SCREEN)
FECAL OCCULT BLD: NEGATIVE
FECAL OCCULT BLD: NEGATIVE
Fecal Occult Blood, POC: NEGATIVE

## 2015-09-21 MED ORDER — METOPROLOL SUCCINATE ER 25 MG PO TB24: 12.5000 mg | ORAL_TABLET | Freq: Every day | ORAL | Status: DC

## 2015-09-21 NOTE — Progress Notes (Signed)
Patient ID: Alexandra Wheeler, female   DOB: 1940-11-27, 74 y.o.   MRN: [id number] PCP: Dr. [name]  74 yo with history of chronic LBBB, possible hypertrophic cardiomyopathy, and CHB with St Jude PPM presents for cardiology followup.  She has an echocardiogram that looks like hypertrophic cardiomyopathy with asymmetric septal hypertrophy and mitral valve SAM with mild MR.  No resting LVOT gradient was recorded.  She developed CHB in 2012 and had St Jude PPM.  She had cardiac cath at that time with no significant CAD.    She does have some exertional dyspnea.  Shortness of breath will occur at varying times, usually with exertion but no definite degree of exertion will bring it on. No lightheadedness or syncope.  Generally she does well. Able to do her yardwork including cutting the grass.  Generally pretty active.  No exertional chest pain, does occasionally get chest discomfort while lying in bed at night that she attributes to GERD.  ECG: NSR, LBBB  Labs (12/13): K 4.1, creatinine 0.67, HCT 38.1, TSH normal, LDL 74, HDL 52, TGs 68 Labs (10/16): K 4.4, creatinine 0.74  PMH: 1. LBBB 2. GERD 3. HTN  4. Depression 5. Hyperlipidemia 6. Chest pain: Adenosine sestamibi (1/14) with no evidence of ischemia or infarction, EF 63%. LHC (8/14) with mild nonobstructive CAD.  7. HCM: Suspected by echo. Echo (1/14) with EF 55%, mild asymmetric focal basal septal hypertrophy with mild SAM and mild MR, LVOT peak gradient 15 mmHg. Echo (9/16) with EF 55-60%, moderate asymmetric septal hypertrophy, no LVOT gradient noted at rest, systolic anterior motion of the mitral valve with moderate MR.  8. Complete heart block: St Jude PPM 2012.   SH: Lives in [place].  Quit smoking in 1990s.  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    FH: 2 brothers with CABG in their 75s, mother with PCM and CHF, no sudden cardiac death.  ROS: All systems reviewed and negative except as per HPI.   Current Outpatient  Prescriptions  Medication Sig Dispense Refill  . ALPRAZolam (XANAX) 1 MG tablet Take 0.5-1 mg by mouth 3 (three) times daily as needed for anxiety.    . Ascorbic Acid (VITAMIN C PO) Take 1 tablet by mouth daily.     Marland Kitchen aspirin EC 81 MG tablet Take 81 mg by mouth daily.    Marland Kitchen BIOTIN PO Take 1 tablet by mouth daily.     . Calcium Carbonate-Vitamin D (CALCIUM + D PO) Take 1 tablet by mouth daily.     . Cholecalciferol (VITAMIN D PO) Take 5,000 Units by mouth daily.     . Coenzyme Q10 (CO Q 10 PO) Take 1 capsule by mouth daily.    Marland Kitchen FOLIC ACID PO Take 1 tablet by mouth daily.     . hydrochlorothiazide (HYDRODIURIL) 25 MG tablet Take 25 mg by mouth daily as needed (swelling).     . hydrocortisone (ANUSOL-HC) 2.5 % rectal cream Place 1 application rectally 2 (two) times daily. 30 g 0  . MAGNESIUM PO Take 500 mg by mouth daily.     . Multiple Vitamin (MULTIVITAMIN) tablet Take 1 tablet by mouth daily.    . ranitidine (ZANTAC) 300 MG tablet Take 300 mg by mouth daily as needed for heartburn.    . Tretinoin, Facial Wrinkles, (TRETINOIN, EMOLLIENT,) 0.05 % CREA APPLY TO AFFECTED AREA DAILY AS DIRECTED 40 g 2  . metoprolol succinate (TOPROL XL) 25 MG 24 hr tablet Take 0.5 tablets (12.5  mg total) by mouth daily. 90 tablet 3   No current facility-administered medications for this encounter.    BP 102/70 mmHg  Pulse 75  Wt 154 lb 8 oz (70.081 kg)  SpO2 98% General: NAD Neck: No JVD, no thyromegaly or thyroid nodule.  Lungs: Clear to auscultation bilaterally with normal respiratory effort. CV: Nondisplaced PMI.  Heart regular S1/S2, no S3/S4, 2/6 systolic murmur RUSB and apex.  The murmur does not change with standing or valsalva.  No peripheral edema.  No carotid bruit.  Normal pedal pulses.  Abdomen: Soft, nontender, no hepatosplenomegaly, no distention.  Neurologic: Alert and oriented x 3.  Psych: Normal affect. Extremities: No clubbing or cyanosis.   Assessment/Plan: 1. Hypertrophic  cardiomyopathy: Based on her echo, Mrs Alexandra Wheeler appears to have hypertrophic cardiomyopathy with asymmetric septal hypertrophy and mitral valve SAM.  No significant LVOT gradient measured at rest.  Systolic murmur on exam.  NYHA class II symptoms => she has some exertional dyspnea but it is not easy to characterize.  No family history of sudden death.   - Would start beta blocker, begin low with Toprol XL 12.5 mg daily and will titrate up as able.  - SCD risk stratification: No family history of sudden death.  Septum is not markedly thickened.  Will arrange ETT to assess for exercise-induced arrhythmias and to make sure that she has a normal BP response to exercise. Can follow PVC burden on her PPM. Finally, unable to get cardiac MRI given presence of pacemaker.  - She has one daughter.  Daughter should get echo to screen for HCM.  2. Complete heart block: Has St Jude PPM, followed by Dr Alexandra Wheeler.   3. Chest pain: Atypical.  Normal LHC in 2012.      Alexandra Wheeler 09/21/2015 4:17 PM

## 2015-09-21 NOTE — Progress Notes (Signed)
Advanced Heart Failure Medication Review by a Pharmacist  Does the patient  feel that his/her medications are working for him/her?  yes  Has the patient been experiencing any side effects to the medications prescribed?  no  Does the patient measure his/her own blood pressure or blood glucose at home?  no   Does the patient have any problems obtaining medications due to transportation or finances?   no  Understanding of regimen: good Understanding of indications: good Potential of compliance: good Patient understands to avoid NSAIDs. Patient understands to avoid decongestants.  Issues to address at subsequent visits: None   Pharmacist comments:  Ms. Alexandra Wheeler is a pleasant 74 yo F presenting without a medication list but with excellent recall of her medications including dosages. She reports only requiring hctz every 2-3 weeks with minimal episodes of edema. She did not have any specific medication-related questions or concerns for me at this time.  Alexandra Wheeler, PharmD, BCPS, CPP Clinical Pharmacist Pager: [phone] Phone: [phone] 09/21/2015 8:43 AM     Time with patient: 4 minutes Preparation and documentation time: 2 minutes Total time: 6 minutes

## 2015-09-21 NOTE — Patient Instructions (Signed)
Medications:  Start Metoprolol Succinate 12.5 mg (1/2 tablet) daily  Procedures:  [facility]read[facility]  Follow up:  6 weeks with Shirlee LatchMcLean

## 2015-09-24 ENCOUNTER — Other Ambulatory Visit: Payer: Self-pay | Admitting: Specialist

## 2015-09-24 DIAGNOSIS — R51 Headache: Secondary | ICD-10-CM

## 2015-09-24 DIAGNOSIS — R519 Headache, unspecified: Secondary | ICD-10-CM

## 2015-09-24 DIAGNOSIS — Z049 Encounter for examination and observation for unspecified reason: Secondary | ICD-10-CM | POA: Diagnosis not present

## 2015-09-24 DIAGNOSIS — M542 Cervicalgia: Secondary | ICD-10-CM

## 2015-09-24 DIAGNOSIS — Z79899 Other long term (current) drug therapy: Secondary | ICD-10-CM | POA: Diagnosis not present

## 2015-09-29 ENCOUNTER — Other Ambulatory Visit: Payer: Self-pay | Admitting: Specialist

## 2015-09-29 DIAGNOSIS — R51 Headache: Principal | ICD-10-CM

## 2015-09-29 DIAGNOSIS — M542 Cervicalgia: Secondary | ICD-10-CM

## 2015-09-29 DIAGNOSIS — G8929 Other chronic pain: Secondary | ICD-10-CM

## 2015-09-30 DIAGNOSIS — R51 Headache: Secondary | ICD-10-CM | POA: Diagnosis not present

## 2015-09-30 DIAGNOSIS — M542 Cervicalgia: Secondary | ICD-10-CM | POA: Diagnosis not present

## 2015-09-30 DIAGNOSIS — M791 Myalgia: Secondary | ICD-10-CM | POA: Diagnosis not present

## 2015-10-07 ENCOUNTER — Encounter: Payer: Medicare PPO | Admitting: Physician Assistant

## 2015-10-07 ENCOUNTER — Ambulatory Visit (INDEPENDENT_AMBULATORY_CARE_PROVIDER_SITE_OTHER): Payer: Medicare PPO

## 2015-10-07 DIAGNOSIS — I499 Cardiac arrhythmia, unspecified: Secondary | ICD-10-CM | POA: Diagnosis not present

## 2015-10-07 LAB — EXERCISE TOLERANCE TEST
CHL CUP RESTING HR STRESS: 67 {beats}/min
CSEPED: 5 min
Estimated workload: 7 METS
Exercise duration (sec): 30 s
MPHR: 145 {beats}/min
Peak HR: 117 {beats}/min
Percent HR: 81 %
RPE: 15

## 2015-10-09 ENCOUNTER — Ambulatory Visit
Admission: RE | Admit: 2015-10-09 | Discharge: 2015-10-09 | Disposition: A | Discharge status: Home / Self Care | Payer: Medicare PPO | Source: Ambulatory Visit | Attending: Specialist | Admitting: Specialist

## 2015-10-09 ENCOUNTER — Telehealth (HOSPITAL_COMMUNITY): Payer: Self-pay

## 2015-10-09 DIAGNOSIS — G8929 Other chronic pain: Secondary | ICD-10-CM

## 2015-10-09 DIAGNOSIS — R51 Headache: Principal | ICD-10-CM

## 2015-10-09 DIAGNOSIS — M542 Cervicalgia: Secondary | ICD-10-CM

## 2015-10-09 DIAGNOSIS — R519 Headache, unspecified: Secondary | ICD-10-CM

## 2015-10-09 MED ORDER — IOPAMIDOL (ISOVUE-300) INJECTION 61%
75.0000 mL | Freq: Once | INTRAVENOUS | Status: AC | PRN
  Administered 2015-10-09: 75 mL via INTRAVENOUS

## 2015-10-09 NOTE — Telephone Encounter (Signed)
LVMTCB

## 2015-10-14 ENCOUNTER — Other Ambulatory Visit: Payer: Self-pay | Admitting: Specialist

## 2015-10-14 DIAGNOSIS — R519 Headache, unspecified: Secondary | ICD-10-CM

## 2015-10-14 DIAGNOSIS — M542 Cervicalgia: Secondary | ICD-10-CM

## 2015-10-14 DIAGNOSIS — R51 Headache: Principal | ICD-10-CM

## 2015-10-15 DIAGNOSIS — M791 Myalgia: Secondary | ICD-10-CM | POA: Diagnosis not present

## 2015-10-15 DIAGNOSIS — M542 Cervicalgia: Secondary | ICD-10-CM | POA: Diagnosis not present

## 2015-10-15 DIAGNOSIS — R51 Headache: Secondary | ICD-10-CM | POA: Diagnosis not present

## 2015-10-20 ENCOUNTER — Ambulatory Visit
Admission: RE | Admit: 2015-10-20 | Discharge: 2015-10-20 | Disposition: A | Discharge status: Home / Self Care | Payer: Medicare PPO | Source: Ambulatory Visit | Attending: Specialist | Admitting: Specialist

## 2015-10-20 ENCOUNTER — Other Ambulatory Visit: Payer: Medicare PPO

## 2015-10-20 DIAGNOSIS — M542 Cervicalgia: Secondary | ICD-10-CM

## 2015-10-20 DIAGNOSIS — R51 Headache: Principal | ICD-10-CM

## 2015-10-20 DIAGNOSIS — R519 Headache, unspecified: Secondary | ICD-10-CM

## 2015-10-26 DIAGNOSIS — M791 Myalgia: Secondary | ICD-10-CM | POA: Diagnosis not present

## 2015-10-26 DIAGNOSIS — R51 Headache: Secondary | ICD-10-CM | POA: Diagnosis not present

## 2015-10-26 DIAGNOSIS — M542 Cervicalgia: Secondary | ICD-10-CM | POA: Diagnosis not present

## 2015-11-04 ENCOUNTER — Encounter (HOSPITAL_COMMUNITY): Payer: Medicare PPO

## 2015-11-10 ENCOUNTER — Ambulatory Visit (INDEPENDENT_AMBULATORY_CARE_PROVIDER_SITE_OTHER): Payer: Medicare PPO | Admitting: *Deleted

## 2015-11-10 DIAGNOSIS — I442 Atrioventricular block, complete: Secondary | ICD-10-CM | POA: Diagnosis not present

## 2015-11-11 DIAGNOSIS — M791 Myalgia: Secondary | ICD-10-CM | POA: Diagnosis not present

## 2015-11-11 DIAGNOSIS — R51 Headache: Secondary | ICD-10-CM | POA: Diagnosis not present

## 2015-11-11 DIAGNOSIS — M542 Cervicalgia: Secondary | ICD-10-CM | POA: Diagnosis not present

## 2015-11-11 NOTE — Progress Notes (Signed)
Remote pacemaker transmission.   

## 2015-11-18 ENCOUNTER — Encounter: Payer: Self-pay | Admitting: Cardiology

## 2015-11-18 LAB — CUP PACEART REMOTE DEVICE CHECK
Battery Remaining Percentage: 95.5 %
Battery Voltage: 2.99 V
Brady Statistic AP VP Percent: 57 %
Brady Statistic AP VS Percent: 1 %
Brady Statistic AS VP Percent: 43 %
Brady Statistic AS VS Percent: 1 %
Implantable Lead Implant Date: 20140805
Implantable Lead Implant Date: 20140805
Implantable Lead Location: 753859
Implantable Lead Model: 1948
Lead Channel Impedance Value: 640 Ohm
Lead Channel Pacing Threshold Amplitude: 0.25 V
Lead Channel Pacing Threshold Amplitude: 0.875 V
Lead Channel Pacing Threshold Pulse Width: 0.4 ms
Lead Channel Sensing Intrinsic Amplitude: 2.9 mV
Lead Channel Setting Pacing Amplitude: 1.125
Lead Channel Setting Pacing Pulse Width: 0.4 ms
MDC IDC LEAD LOCATION: 753860
MDC IDC LEAD MODEL: 1944
MDC IDC MSMT BATTERY REMAINING LONGEVITY: 127 mo
MDC IDC MSMT LEADCHNL RA IMPEDANCE VALUE: 560 Ohm
MDC IDC MSMT LEADCHNL RA PACING THRESHOLD PULSEWIDTH: 0.4 ms
MDC IDC MSMT LEADCHNL RV SENSING INTR AMPL: 12 mV
MDC IDC PG SERIAL: 7523768
MDC IDC SESS DTM: 20170103070015
MDC IDC SET LEADCHNL RA PACING AMPLITUDE: 2 V
MDC IDC SET LEADCHNL RV SENSING SENSITIVITY: 4 mV
MDC IDC STAT BRADY RA PERCENT PACED: 57 %
MDC IDC STAT BRADY RV PERCENT PACED: 99 %

## 2015-11-25 ENCOUNTER — Encounter (HOSPITAL_COMMUNITY): Payer: Medicare PPO

## 2015-11-25 DIAGNOSIS — M791 Myalgia: Secondary | ICD-10-CM | POA: Diagnosis not present

## 2015-11-25 DIAGNOSIS — M542 Cervicalgia: Secondary | ICD-10-CM | POA: Diagnosis not present

## 2015-11-25 DIAGNOSIS — R51 Headache: Secondary | ICD-10-CM | POA: Diagnosis not present

## 2015-12-04 ENCOUNTER — Ambulatory Visit: Payer: Medicare PPO | Admitting: Cardiology

## 2015-12-09 ENCOUNTER — Ambulatory Visit (INDEPENDENT_AMBULATORY_CARE_PROVIDER_SITE_OTHER): Payer: Medicare PPO | Admitting: Internal Medicine

## 2015-12-09 ENCOUNTER — Encounter: Payer: Self-pay | Admitting: Internal Medicine

## 2015-12-09 VITALS — BP 118/82 | HR 68 | Temp 97.0°F | Resp 16 | Ht 64.5 in | Wt 154.2 lb

## 2015-12-09 DIAGNOSIS — R51 Headache: Secondary | ICD-10-CM | POA: Diagnosis not present

## 2015-12-09 DIAGNOSIS — M25512 Pain in left shoulder: Secondary | ICD-10-CM

## 2015-12-09 DIAGNOSIS — G8929 Other chronic pain: Secondary | ICD-10-CM

## 2015-12-09 MED ORDER — PREDNISONE 20 MG PO TABS: ORAL_TABLET | ORAL | Status: DC

## 2015-12-09 MED ORDER — TRAMADOL HCL 50 MG PO TABS: ORAL_TABLET | ORAL | Status: AC

## 2015-12-09 MED ORDER — GABAPENTIN 600 MG PO TABS: ORAL_TABLET | ORAL | Status: DC

## 2015-12-09 NOTE — Progress Notes (Signed)
Subjective:    Patient ID: Alexandra Wheeler, female    DOB: May 24, 1941, 75 y.o.   MRN: [id number]  HPI Patient is a very nice 75 yo WWF referred to Dr Judie Petit. Neale Burly last Oct 2016 for suspected R Occipital Neuralgia. Since the she has been receiving occipital & posterior Cx injections w/o any benefit for her HA's.  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 She describes her neck pains as constant and waxing & waning in intensity throughout the day - occasionally precluding sleep. More recently over the last 2 weeks she has been experiencing bilat shoulder pains - worse on the left.    Medication Sig  . ALPRAZolam 1 MG tablet Take 0.5-1 mg by mouth 3 (three) times daily as needed for anxiety.  Marland Kitchen VITAMIN C Take 1 tablet by mouth daily.   Marland Kitchen aspirin EC 81 MG Take 81 mg by mouth daily.  Marland Kitchen BIOTIN PO Take 1 tablet by mouth daily.   . Calcium -Vitamin D   Take 1 tablet by mouth daily.   Marland Kitchen VITAMIN D Take 5,000 Units by mouth daily.   . Coenzyme Q10 (CO Q 10 PO) Take 1 capsule by mouth daily.  Marland Kitchen FOLIC ACID PO Take 1 tablet by mouth daily.   . hydrochlorothiazide  25 MG  Take 25 mg by mouth daily as needed (swelling).   . ANUSOL-HC 2.5 % rectal crm Place 1 application rectally 2 (two) times daily.  Marland Kitchen MAGNESIUM  Take 500 mg by mouth daily.   . metoprolol succinate- XL 25 MG  Take 0.5 tablets (12.5 mg total) by mouth daily.  . Multiple Vitamin  Take 1 tablet by mouth daily.  . Ranitidine 300 MG  Take 300 mg by mouth daily as needed for heartburn.  . Tretinoin 0.05 % CREA APPLY TO AFFECTED AREA DAILY AS DIRECTED   Allergies  Allergen Reactions  . Actonel [Risedronate Sodium]     Pt didn't like the way it made her feel   . Boniva [Ibandronic Acid]     Pt didn't like the way it made her feel  . Evista [Raloxifene]     Pt didn't like the way it made her feel  . Fosamax [Alendronate Sodium]     Pt didn't like the way it made her feel   . Levsin  [Hyoscyamine Sulfate]     unknown  . Mobic [Meloxicam]     unknown   . Topamax [Topiramate]     unknown   . Zoloft [Sertraline Hcl]     unknown    Past Medical History  Diagnosis Date  . Osteoporosis   . DI (detrusor instability)   . Atrophic vaginitis   . Anxiety   . Colon polyps   . H/O echocardiogram     a. Echo 11/2012:EF 50-55%, focal basal septal hypertrophy with some mitral valve SAM (mild) and MR.  This may be a sigmoid septum with advanced age or could be a variant of hypertrophic cardiomyopathy.  Marland Kitchen LBBB (left bundle branch block)   . Depression   . HTN (hypertension)   . Hyperlipidemia   . Chest pain     a. Adenosine sestamibi (1/14) with no evidence of ischemia or infarction, EF 63%.   . Complete heart block (HCC) 06-2013    status post pacemaker implantation by Dr Johney Frame 06-11-2013  . Migraines   . Prediabetes   . Vitamin D deficiency   . GERD (  gastroesophageal reflux disease)     History + Hyplori via EGD  . Leukopenia 05/19/2014  . Lymphocytosis 05/19/2014   Review of Systems  10 point systems review negative except as above.  Objective:   Physical Exam  BP 118/82 mmHg  Pulse 68  Temp(Src) 97 F (36.1 C)  Resp 16  Ht 5' 4.5" (1.638 m)  Wt 154 lb 3.2 oz (69.945 kg)  BMI 26.07 kg/m2  HEENT - Eac's patent. TM's Nl. EOM's full. PERRLA. NasoOroPharynx clear. Neck - supple. Nl Thyroid. Carotids 2+ & No bruits, nodes, JVD (+) exquisite tender trigger points of the occipital & posterior cx area.  Chest - Clear equal BS w/o Rales, rhonchi, wheezes. Cor - Nl HS. RRR w/o sig MGR. PP 1(+). No edema. Abd - No palpable organomegaly, masses or tenderness. BS nl. MS- FROM w/o deformities. Decreased internal/external rotation 7 abduction of the L shoulder due to pain.Muscle power, tone and bulk Nl. Gait Nl. Neuro - No obvious Cr N abnormalities. Sensory, motor and Cerebellar functions appear Nl w/o focal abnormalities. Psyche - Mental status normal & appropriate.  No  delusions, ideations or obvious mood abnormalities.    Assessment & Plan:   1. Chronic intractable headache, unspecified headache type  - gabapentin (NEURONTIN) 600 MG tablet; Take 1/2 to 1 tablet 3 to 4 x day as needed for neuralgia pain  Dispense: 120 tablet; Refill: 5 - predniSONE (DELTASONE) 20 MG tablet; 1 tab 3 x day for 3 days, then 1 tab 2 x day for 3 days, then 1 tab 1 x day for 5 days  Dispense: 20 tablet; Refill: 0 - traMADol (ULTRAM) 50 MG tablet; Take 1 tablet 4 x day if needed for severe pain  Dispense: 50 tablet; Refill: 1 - ROV 2 weeks to reassess  2. Shoulder pain, acute, left  - predniSONE (DELTASONE) 20 MG tablet; 1 tab 3 x day for 3 days, then 1 tab 2 x day for 3 days, then 1 tab 1 x day for 5 days  Dispense: 20 tablet; Refill: 0 - traMADol (ULTRAM) 50 MG tablet; Take 1 tablet 4 x day if needed for severe pain  Dispense: 50 tablet; Refill: 1  - Ambulatory referral to Orthopedic Surgery

## 2015-12-09 NOTE — Patient Instructions (Signed)

## 2015-12-10 ENCOUNTER — Other Ambulatory Visit: Payer: Self-pay | Admitting: Physician Assistant

## 2015-12-17 ENCOUNTER — Encounter (HOSPITAL_COMMUNITY): Payer: Medicare PPO

## 2015-12-21 DIAGNOSIS — M25512 Pain in left shoulder: Secondary | ICD-10-CM | POA: Diagnosis not present

## 2015-12-21 DIAGNOSIS — M545 Low back pain: Secondary | ICD-10-CM | POA: Diagnosis not present

## 2015-12-23 ENCOUNTER — Ambulatory Visit: Payer: Self-pay | Admitting: Internal Medicine

## 2015-12-23 DIAGNOSIS — R51 Headache: Secondary | ICD-10-CM | POA: Diagnosis not present

## 2015-12-23 DIAGNOSIS — M47812 Spondylosis without myelopathy or radiculopathy, cervical region: Secondary | ICD-10-CM | POA: Diagnosis not present

## 2015-12-30 ENCOUNTER — Telehealth: Payer: Self-pay | Admitting: *Deleted

## 2015-12-30 DIAGNOSIS — M25512 Pain in left shoulder: Secondary | ICD-10-CM

## 2015-12-30 DIAGNOSIS — G8929 Other chronic pain: Secondary | ICD-10-CM

## 2015-12-30 DIAGNOSIS — R51 Headache: Principal | ICD-10-CM

## 2015-12-30 MED ORDER — PREDNISONE 20 MG PO TABS: ORAL_TABLET | ORAL | Status: DC

## 2015-12-30 MED ORDER — AZITHROMYCIN 250 MG PO TABS: ORAL_TABLET | ORAL | Status: AC

## 2015-12-30 NOTE — Telephone Encounter (Signed)
Patient called and states she has a sore throat, chest congestion, cough and sinus congestion.  Per Dr Oneta Rack, send in an RX for  Z-pak and :Prednisone.

## 2016-01-06 ENCOUNTER — Other Ambulatory Visit: Payer: Self-pay | Admitting: Internal Medicine

## 2016-01-06 DIAGNOSIS — R05 Cough: Secondary | ICD-10-CM

## 2016-01-06 DIAGNOSIS — R059 Cough, unspecified: Secondary | ICD-10-CM

## 2016-01-06 MED ORDER — PROMETHAZINE-DM 6.25-15 MG/5ML PO SYRP: ORAL_SOLUTION | ORAL | Status: AC

## 2016-01-12 DIAGNOSIS — M47812 Spondylosis without myelopathy or radiculopathy, cervical region: Secondary | ICD-10-CM | POA: Diagnosis not present

## 2016-01-27 ENCOUNTER — Other Ambulatory Visit: Payer: Self-pay | Admitting: Physical Medicine and Rehabilitation

## 2016-01-27 DIAGNOSIS — M47812 Spondylosis without myelopathy or radiculopathy, cervical region: Secondary | ICD-10-CM | POA: Diagnosis not present

## 2016-01-27 DIAGNOSIS — M4807 Spinal stenosis, lumbosacral region: Secondary | ICD-10-CM

## 2016-01-27 DIAGNOSIS — M79606 Pain in leg, unspecified: Secondary | ICD-10-CM | POA: Diagnosis not present

## 2016-02-03 ENCOUNTER — Ambulatory Visit
Admission: RE | Admit: 2016-02-03 | Discharge: 2016-02-03 | Disposition: A | Discharge status: Home / Self Care | Payer: Medicare PPO | Source: Ambulatory Visit | Attending: Physical Medicine and Rehabilitation | Admitting: Physical Medicine and Rehabilitation

## 2016-02-03 DIAGNOSIS — M4807 Spinal stenosis, lumbosacral region: Secondary | ICD-10-CM

## 2016-02-03 DIAGNOSIS — M5126 Other intervertebral disc displacement, lumbar region: Secondary | ICD-10-CM | POA: Diagnosis not present

## 2016-02-03 MED ORDER — ONDANSETRON HCL 4 MG/2ML IJ SOLN
4.0000 mg | Freq: Once | INTRAMUSCULAR | Status: AC
  Administered 2016-02-03: 4 mg via INTRAMUSCULAR

## 2016-02-03 MED ORDER — DIAZEPAM 5 MG PO TABS
5.0000 mg | ORAL_TABLET | Freq: Once | ORAL | Status: AC
  Administered 2016-02-03: 5 mg via ORAL

## 2016-02-03 MED ORDER — MEPERIDINE HCL 100 MG/ML IJ SOLN
50.0000 mg | Freq: Once | INTRAMUSCULAR | Status: AC
  Administered 2016-02-03: 50 mg via INTRAMUSCULAR

## 2016-02-03 MED ORDER — IOHEXOL 180 MG/ML  SOLN
15.0000 mL | Freq: Once | INTRAMUSCULAR | Status: AC | PRN
  Administered 2016-02-03: 15 mL via INTRATHECAL

## 2016-02-03 NOTE — Discharge Instructions (Signed)
Myelogram Discharge Instructions  1. Go home and rest quietly for the next 24 hours.  It is important to lie flat for the next 24 hours.  Get up only to go to the restroom.  You may lie in the bed or on a couch on your back, your stomach, your left side or your right side.  You may have one pillow under your head.  You may have pillows between your knees while you are on your side or under your knees while you are on your back.  2. DO NOT drive today.  Recline the seat as far back as it will go, while still wearing your seat belt, on the way home.  3. You may get up to go to the bathroom as needed.  You may sit up for 10 minutes to eat.  You may resume your normal diet and medications unless otherwise indicated.  Drink lots of extra fluids today and tomorrow.  4. The incidence of headache, nausea, or vomiting is about 5% (one in 20 patients).  If you develop a headache, lie flat and drink plenty of fluids until the headache goes away.  Caffeinated beverages may be helpful.  If you develop severe nausea and vomiting or a headache that does not go away with flat bed rest, call [phone].  5. You may resume normal activities after your 24 hours of bed rest is over; however, do not exert yourself strongly or do any heavy lifting tomorrow. If when you get up you have a headache when standing, go back to bed and force fluids for another 24 hours.  6. Call your physician for a follow-up appointment.  The results of your myelogram will be sent directly to your physician by the following day.  7. If you have any questions or if complications develop after you arrive home, please call [phone].  Discharge instructions have been explained to the patient.  The patient, or the person responsible for the patient, fully understands these instructions.        May resume Tramadol and Promethazine on February 04, 2016, after 9:30 am.

## 2016-02-03 NOTE — Progress Notes (Signed)
Pt states she has been off Tramadol and Promethazine for the past 2 days.

## 2016-02-07 ENCOUNTER — Emergency Department (HOSPITAL_BASED_OUTPATIENT_CLINIC_OR_DEPARTMENT_OTHER)
Admission: EM | Admit: 2016-02-07 | Discharge: 2016-02-07 | Disposition: A | Discharge status: Home / Self Care | Payer: Medicare PPO | Attending: Emergency Medicine | Admitting: Emergency Medicine

## 2016-02-07 ENCOUNTER — Encounter (HOSPITAL_BASED_OUTPATIENT_CLINIC_OR_DEPARTMENT_OTHER): Payer: Self-pay | Admitting: *Deleted

## 2016-02-07 DIAGNOSIS — R55 Syncope and collapse: Secondary | ICD-10-CM | POA: Diagnosis not present

## 2016-02-07 DIAGNOSIS — F419 Anxiety disorder, unspecified: Secondary | ICD-10-CM | POA: Insufficient documentation

## 2016-02-07 DIAGNOSIS — Z8639 Personal history of other endocrine, nutritional and metabolic disease: Secondary | ICD-10-CM | POA: Diagnosis not present

## 2016-02-07 DIAGNOSIS — Z862 Personal history of diseases of the blood and blood-forming organs and certain disorders involving the immune mechanism: Secondary | ICD-10-CM | POA: Insufficient documentation

## 2016-02-07 DIAGNOSIS — Z87448 Personal history of other diseases of urinary system: Secondary | ICD-10-CM | POA: Diagnosis not present

## 2016-02-07 DIAGNOSIS — R42 Dizziness and giddiness: Secondary | ICD-10-CM | POA: Diagnosis not present

## 2016-02-07 DIAGNOSIS — F329 Major depressive disorder, single episode, unspecified: Secondary | ICD-10-CM | POA: Insufficient documentation

## 2016-02-07 DIAGNOSIS — R519 Headache, unspecified: Secondary | ICD-10-CM

## 2016-02-07 DIAGNOSIS — I10 Essential (primary) hypertension: Secondary | ICD-10-CM | POA: Diagnosis not present

## 2016-02-07 DIAGNOSIS — K219 Gastro-esophageal reflux disease without esophagitis: Secondary | ICD-10-CM | POA: Insufficient documentation

## 2016-02-07 DIAGNOSIS — Z950 Presence of cardiac pacemaker: Secondary | ICD-10-CM | POA: Insufficient documentation

## 2016-02-07 DIAGNOSIS — Z87891 Personal history of nicotine dependence: Secondary | ICD-10-CM | POA: Diagnosis not present

## 2016-02-07 DIAGNOSIS — Z9889 Other specified postprocedural states: Secondary | ICD-10-CM | POA: Insufficient documentation

## 2016-02-07 DIAGNOSIS — Z86010 Personal history of colonic polyps: Secondary | ICD-10-CM | POA: Diagnosis not present

## 2016-02-07 DIAGNOSIS — Z856 Personal history of leukemia: Secondary | ICD-10-CM | POA: Diagnosis not present

## 2016-02-07 DIAGNOSIS — Z8742 Personal history of other diseases of the female genital tract: Secondary | ICD-10-CM | POA: Diagnosis not present

## 2016-02-07 DIAGNOSIS — Z79899 Other long term (current) drug therapy: Secondary | ICD-10-CM | POA: Insufficient documentation

## 2016-02-07 DIAGNOSIS — R51 Headache: Secondary | ICD-10-CM | POA: Diagnosis not present

## 2016-02-07 DIAGNOSIS — M810 Age-related osteoporosis without current pathological fracture: Secondary | ICD-10-CM | POA: Insufficient documentation

## 2016-02-07 DIAGNOSIS — Z7982 Long term (current) use of aspirin: Secondary | ICD-10-CM | POA: Insufficient documentation

## 2016-02-07 NOTE — ED Provider Notes (Signed)
CSN: [id number]     Arrival date & time 02/07/16  1843 History  By signing my name below, I, Rohini Rajnarayanan, attest that this documentation has been prepared under the direction and in the presence of Doug Sou, MD Electronically Signed: Charlean Merl, ED Scribe 02/07/2016 at 7:50 PM.   Chief Complaint  Patient presents with  . Headache   The history is provided by the patient. No language interpreter was used.   HPI Comments: Alexandra Wheeler is a 75 y.o. female with a pmhChronic headaches who presents to the Emergency Department complaining of an exacerbation of her intermittent, generalized, worsening HA, rated 9/10, which began 3 days ago gradually, with associated feelings of light-headedness which lasted 2 minutes today upon standing, and fear of syncope which began 4 hours ago . Associated symptoms include nausea which has resolved. She took Tramadol today with no immediate relief. However her headache has reduced at this time. Pain is currently under control Pt has been treated for headaches for the past year. She has been having headaches every day for the past 6+ months, however today's headache became unbearable, which is why she came to the ED today. Pt denies feeling any light-headedness at this time. Pt saw an orthopedic doctor who told her that her pain may be associated with arthritis. Pt's PCP is Dr. Michele Mcalpine.   Past Medical History  Diagnosis Date  . Osteoporosis   . DI (detrusor instability)   . Atrophic vaginitis   . Anxiety   . Colon polyps   . H/O echocardiogram     a. Echo 11/2012:EF 50-55%, focal basal septal hypertrophy with some mitral valve Hoover Grewe (mild) and MR.  This may be a sigmoid septum with advanced age or could be a variant of hypertrophic cardiomyopathy.  Marland Kitchen LBBB (left bundle branch block)   . Depression   . HTN (hypertension)   . Hyperlipidemia   . Chest pain     a. Adenosine sestamibi (1/14) with no evidence of ischemia or infarction, EF 63%.   .  Complete heart block (HCC) 06-2013    status post pacemaker implantation by Dr Johney Frame 06-11-2013  . Migraines   . Prediabetes   . Vitamin D deficiency   . GERD (gastroesophageal reflux disease)     History + Hyplori via EGD  . Leukopenia 05/19/2014  . Lymphocytosis 05/19/2014   Past Surgical History  Procedure Laterality Date  . Breast surgery      Breast cyst  . Tubal ligation    . Hemorrhoid surgery    . Pacemaker insertion  06-11-2013    STJ dual chamber pacemaker implanted by Dr Johney Frame for complete heart block  . Left heart catheterization with coronary angiogram N/A 06/11/2013    Procedure: LEFT HEART CATHETERIZATION WITH CORONARY ANGIOGRAM;  Surgeon: Kathleene Hazel, MD;  Location[facility] CATH LAB;  Service: Cardiovascular;  Laterality: N/A;  . Temporary pacemaker insertion N/A 06/11/2013    Procedure: TEMPORARY PACEMAKER INSERTION;  Surgeon: Kathleene Hazel, MD;  Location: [facility] CATH LAB;  Service: Cardiovascular;  Laterality: N/A;  . Permanent pacemaker insertion Left 06/11/2013    Procedure: PERMANENT PACEMAKER INSERTION;  Surgeon: Kathleene Hazel, MD;  Location: [facility] CATH LAB;  Service: Cardiovascular;  Laterality: Left;  . Permanent pacemaker insertion N/A 06/11/2013    Procedure: PERMANENT PACEMAKER INSERTION;  Surgeon: Hillis Range, MD;  Location: [facility] CATH LAB;  Service: Cardiovascular;  Laterality: N/A;   Family History  Problem Relation Age of Onset  . Diabetes Mother   .  Heart disease Mother   . Diabetes Brother   . Hypertension Brother   . Heart disease Brother   . Breast cancer Sister [id number]  . Aneurysm Brother   . Stroke Brother   . Cancer Father     prostate  . Diabetes Brother   . Heart disease Brother    Social History  Substance Use Topics  . Smoking status: Former Smoker    Types: Cigarettes    Quit date: 10/09/1975  . Smokeless tobacco: Never Used  . Alcohol Use: No   OB History    Gravida Para Term Preterm AB TAB SAB Ectopic Multiple Living   2 2  2       1      Review of Systems  Constitutional: Negative.   Respiratory: Negative.   Cardiovascular: Negative.   Gastrointestinal: Positive for nausea.  Musculoskeletal: Negative.   Skin: Negative.   Neurological: Positive for light-headedness and headaches.  Psychiatric/Behavioral: Negative.   All other systems reviewed and are negative.     Allergies  Actonel; Boniva; Evista; Fosamax; Levsin; Mobic; Topamax; and Zoloft  Home Medications   Prior to Admission medications   Medication Sig Start Date End Date Taking? Authorizing Provider  ALPRAZolam Prudy Feeler(XANAX) 1 MG tablet Take 0.5-1 mg by mouth 3 (three) times daily as needed for anxiety.    Historical Provider, MD  Ascorbic Acid (VITAMIN C PO) Take 1 tablet by mouth daily.     Historical Provider, MD  aspirin EC 81 MG tablet Take 81 mg by mouth daily.    Historical Provider, MD  BIOTIN PO Take 1 tablet by mouth daily.     Historical Provider, MD  Calcium Carbonate-Vitamin D (CALCIUM + D PO) Take 1 tablet by mouth daily.     Historical Provider, MD  Cholecalciferol (VITAMIN D PO) Take 5,000 Units by mouth daily.     Historical Provider, MD  Coenzyme Q10 (CO Q 10 PO) Take 1 capsule by mouth daily.    Historical Provider, MD  FOLIC ACID PO Take 1 tablet by mouth daily.     Historical Provider, MD  gabapentin (NEURONTIN) 600 MG tablet Take 1/2 to 1 tablet 3 to 4 x day as needed for neuralgia pain 12/09/15 06/07/16  Lucky CowboyWilliam [name], MD  hydrochlorothiazide (HYDRODIURIL) 25 MG tablet Take 25 mg by mouth daily as needed (swelling).     Historical Provider, MD  hydrocortisone (ANUSOL-HC) 2.5 % rectal cream Place 1 application rectally 2 (two) times daily. 04/29/14   Loree FeeMelissa Smith, PA-C  MAGNESIUM PO Take 500 mg by mouth daily.     Historical Provider, MD  metoprolol succinate (TOPROL XL) 25 MG 24 hr tablet Take 0.5 tablets (12.5 mg total) by mouth daily. 09/21/15   Laurey Moralealton S McLean, MD  Multiple Vitamin (MULTIVITAMIN) tablet Take 1 tablet by  mouth daily.    Historical Provider, MD  predniSONE (DELTASONE) 20 MG tablet Take 1 tablet tid for 5 days. 12/30/15   Lucky CowboyWilliam [name], MD  predniSONE (DELTASONE) 20 MG tablet 1 tab 3 x day for 3 days, then 1 tab 2 x day for 3 days, then 1 tab 1 x day for 5 days 12/30/15   Lucky CowboyWilliam [name], MD  ranitidine (ZANTAC) 300 MG tablet Take 300 mg by mouth daily as needed for heartburn.    Historical Provider, MD  traMADol Janean Sark(ULTRAM) 50 MG tablet Take 1 tablet 4 x day if needed for severe pain 12/09/15 06/07/16  Lucky CowboyWilliam [name], MD  Tretinoin, Facial Wrinkles, (TRETINOIN, EMOLLIENT,) 0.05 %  CREA APPLY TO AFFECTED AREA DAILY AS DIRECTED 08/22/14   Lucky Cowboy, MD  Tretinoin, Facial Wrinkles, (TRETINOIN, EMOLLIENT,) 0.05 % CREA APPLY TO AFFECTED AREA DAILY AS DIRECTED 12/10/15   Lucky Cowboy, MD   BP 139/90 mmHg  Pulse 68  Temp(Src) 98.6 F (37 C) (Oral)  Resp 20  Ht  (1.651 m)  Wt 155 lb (70.308 kg)  BMI 25.79 kg/m2  SpO2 100% Physical Exam  Constitutional: She is oriented to person, place, and time. She appears well-developed and well-nourished. No distress.  HENT:  Head: Normocephalic and atraumatic.  Eyes: Conjunctivae are normal. Pupils are equal, round, and reactive to light.  Neck: Neck supple. No tracheal deviation present. No thyromegaly present.  Cardiovascular: Normal rate and regular rhythm.   No murmur heard. Pulmonary/Chest: Effort normal and breath sounds normal.  Abdominal: Soft. Bowel sounds are normal. She exhibits no distension. There is no tenderness.  Musculoskeletal: Normal range of motion. She exhibits no edema or tenderness.  Neurological: She is alert and oriented to person, place, and time. She has normal reflexes. She displays normal reflexes. No cranial nerve deficit. She exhibits normal muscle tone. Coordination normal.  Gait normal Romberg normal pronator drift normal finger to nose normal DTRs symmetric bilaterally at knee jerk ankle jerk and biceps toes downward  going bilaterally  Skin: Skin is warm and dry. No rash noted.  Psychiatric: She has a normal mood and affect.  Nursing note and vitals reviewed.   ED Course  Procedures  DIAGNOSTIC STUDIES: Oxygen Saturation is 100% on RA, normal by my interpretation.        Labs Reviewed - No data to display  Imaging Review No results found. I have personally reviewed and evaluated these images and lab results as part of my medical decision-making.   EKG Interpretation None     7:55 PM pain is well controlled she feels ready to go home MDM  No further diagnostic testing needed today Patient had myelogram performed last week to investigate the cause of her headaches. I advised her to follow-up with her physician tomorrow to find out results and find out when she needs follow-up. Final diagnoses:  Chronic daily headache   Diagnosis chronic daily headaches I personally performed the services described in this documentation, which was scribed in my presence. The recorded information has been reviewed and considered.   I personally performed the services described in this documentation, which was scribed in my presence. The recorded information has been reviewed and considered.   Doug Sou, MD 02/07/16 [phone]

## 2016-02-07 NOTE — ED Notes (Signed)
RN went to room to discharge patient. Patient had already left without notifying staff. Unable to discuss discharge paperwork or complete a set of VS.

## 2016-02-07 NOTE — ED Notes (Signed)
States she has felt like she was going to pass out today

## 2016-02-07 NOTE — ED Notes (Signed)
Reports she has been treated for headaches for a year. Headache today started on Wednesday. Has been intermittent. Rates pain 9/10 at this time

## 2016-02-07 NOTE — Discharge Instructions (Signed)
It is okay to take her tramadol as prescribed. Follow up with your physician tomorrow to get results of myelogram and ask office staff when you need to be seen again in the office. Return if your pain is not well controlled or if concerned for any reason

## 2016-02-09 ENCOUNTER — Ambulatory Visit (INDEPENDENT_AMBULATORY_CARE_PROVIDER_SITE_OTHER): Payer: Medicare PPO | Admitting: *Deleted

## 2016-02-09 DIAGNOSIS — I442 Atrioventricular block, complete: Secondary | ICD-10-CM

## 2016-02-09 NOTE — Progress Notes (Signed)
Remote pacemaker transmission.   

## 2016-02-12 DIAGNOSIS — M47816 Spondylosis without myelopathy or radiculopathy, lumbar region: Secondary | ICD-10-CM | POA: Diagnosis not present

## 2016-02-16 ENCOUNTER — Encounter: Payer: Self-pay | Admitting: Internal Medicine

## 2016-02-16 ENCOUNTER — Ambulatory Visit (INDEPENDENT_AMBULATORY_CARE_PROVIDER_SITE_OTHER): Payer: Medicare PPO | Admitting: Internal Medicine

## 2016-02-16 VITALS — BP 108/78 | HR 76 | Temp 97.3°F | Resp 16 | Ht 64.5 in | Wt 146.6 lb

## 2016-02-16 DIAGNOSIS — I951 Orthostatic hypotension: Secondary | ICD-10-CM | POA: Diagnosis not present

## 2016-02-16 DIAGNOSIS — Z79899 Other long term (current) drug therapy: Secondary | ICD-10-CM | POA: Diagnosis not present

## 2016-02-16 DIAGNOSIS — R5383 Other fatigue: Secondary | ICD-10-CM | POA: Diagnosis not present

## 2016-02-16 DIAGNOSIS — R300 Dysuria: Secondary | ICD-10-CM

## 2016-02-16 LAB — CBC WITH DIFFERENTIAL/PLATELET
BASOS PCT: 1 %
Basophils Absolute: 46 cells/uL (ref 0–200)
EOS PCT: 1 %
Eosinophils Absolute: 46 cells/uL (ref 15–500)
HCT: 42.8 % (ref 35.0–45.0)
Hemoglobin: 13.8 g/dL (ref 11.7–15.5)
Lymphocytes Relative: 50 %
Lymphs Abs: 2300 cells/uL (ref 850–3900)
MCH: 27.3 pg (ref 27.0–33.0)
MCHC: 32.2 g/dL (ref 32.0–36.0)
MCV: 84.8 fL (ref 80.0–100.0)
MONOS PCT: 8 %
MPV: 10.3 fL (ref 7.5–12.5)
Monocytes Absolute: 368 cells/uL (ref 200–950)
NEUTROS ABS: 1840 {cells}/uL (ref 1500–7800)
Neutrophils Relative %: 40 %
PLATELETS: 209 10*3/uL (ref 140–400)
RBC: 5.05 MIL/uL (ref 3.80–5.10)
RDW: 14.8 % (ref 11.0–15.0)
WBC: 4.6 10*3/uL (ref 3.8–10.8)

## 2016-02-16 LAB — TSH: TSH: 0.95 mIU/L

## 2016-02-16 NOTE — Patient Instructions (Signed)
Only take fluid pill if BP is elevated    & only take 1/2 tablet   Encourage liberal fluids

## 2016-02-16 NOTE — Progress Notes (Signed)
Subjective:    Patient ID: Alexandra Wheeler, female    DOB: 01/07/1941, 75 y.o.   MRN: [id number]  HPI  This very nice 75 yo WWF presents with vague sx's & c/o "feeling bad". She reports HA since recent Lumbar myelogram and has c/o Nauseaand c/o being lightheaded and having occasional palpitations  and denies CP. For the last 10-14 days, she's had hot flashes and occasionally feeling clammy or cold, but not shaking rigors.  She's not sure if she's had fever. Has has some urinary frequency and dysuria.   Medication Sig  . ALPRAZolam 1 MG tablet Take 0.5-1 mg by mouth 3 (three) times daily as needed for anxiety.  Marland Kitchen. VITAMIN C  Take 1 tablet by mouth daily.   Marland Kitchen. aspirin EC 81 MG  Take 81 mg by mouth daily.  Marland Kitchen. BIOTIN PO Take 1 tablet by mouth daily.   Marland Kitchen. CALCIUM + D  Take 1 tablet by mouth daily.   Marland Kitchen. VITAMIN D  Take 5,000 Units by mouth daily.   . Coenzyme Q10  Take 1 capsule by mouth daily.  Marland Kitchen. FOLIC ACID PO Take 1 tablet by mouth daily.   Marland Kitchen. gabapentin  600 MG tablet Take 1/2 to 1 tablet 3 to 4 x day as needed for neuralgia pain  . hctz 25 MG tablet Take 25 mg by mouth daily as needed (swelling).   . ANUSOL-HC 2.5 % rectal cream Place 1 application rectally 2 (two) times daily.  Marland Kitchen. MAGNESIUM PO Take 500 mg by mouth daily.   . metoprolol succinate-XL 25 MG  Take 0.5 tablets (12.5 mg total) by mouth daily.  . Multiple Vitamin  Take 1 tablet by mouth daily.  . Ranitidine 300 MG tablet Take 300 mg by mouth daily as needed for heartburn.  . traMADol 50 MG tablet Take 1 tablet 4 x day if needed for severe pain  . Tretinoin  0.05 % CREA APPLY TO AFFECTED AREA DAILY AS DIRECTED   Allergies  Allergen Reactions  . Actonel [Risedronate Sodium]     Pt didn't like the way it made her feel   . Boniva [Ibandronic Acid]     Pt didn't like the way it made her feel  . Evista [Raloxifene]     Pt didn't like the way it made her feel  . Fosamax [Alendronate Sodium]     Pt didn't like the way it made her  feel   . Levsin [Hyoscyamine Sulfate]     unknown  . Mobic [Meloxicam]     unknown   . Topamax [Topiramate]     unknown   . Zoloft [Sertraline Hcl]     unknown    Past Medical History  Diagnosis Date  . Osteoporosis   . DI (detrusor instability)   . Atrophic vaginitis   . Anxiety   . Colon polyps   . H/O echocardiogram     a. Echo 11/2012:EF 50-55%, focal basal septal hypertrophy with some mitral valve SAM (mild) and MR.  This may be a sigmoid septum with advanced age or could be a variant of hypertrophic cardiomyopathy.  Marland Kitchen. LBBB (left bundle branch block)   . Depression   . HTN (hypertension)   . Hyperlipidemia   . Chest pain     a. Adenosine sestamibi (1/14) with no evidence of ischemia or infarction, EF 63%.   . Complete heart block (HCC) 06-2013    status post pacemaker implantation by Dr Johney FrameAllred 06-11-2013  . Migraines   .  Prediabetes   . Vitamin D deficiency   . GERD (gastroesophageal reflux disease)     History + Hyplori via EGD  . Leukopenia 05/19/2014  . Lymphocytosis 05/19/2014    Past Surgical History  Procedure Laterality Date  . Breast surgery      Breast cyst  . Tubal ligation    . Hemorrhoid surgery    . Pacemaker insertion  06-11-2013    STJ dual chamber pacemaker implanted by Dr Johney Frame for complete heart block  . Left heart catheterization with coronary angiogram N/A 06/11/2013    Procedure: LEFT HEART CATHETERIZATION WITH CORONARY ANGIOGRAM;  Surgeon: Kathleene Hazel, MD;  Location: [facility] CATH LAB;  Service: Cardiovascular;  Laterality: N/A;  . Temporary pacemaker insertion N/A 06/11/2013    Procedure: TEMPORARY PACEMAKER INSERTION;  Surgeon: Kathleene Hazel, MD;  Location: [facility] CATH LAB;  Service: Cardiovascular;  Laterality: N/A;  . Permanent pacemaker insertion Left 06/11/2013    Procedure: PERMANENT PACEMAKER INSERTION;  Surgeon: Kathleene Hazel, MD;  Location: [facility] CATH LAB;  Service: Cardiovascular;  Laterality: Left;  . Permanent pacemaker  insertion N/A 06/11/2013    Procedure: PERMANENT PACEMAKER INSERTION;  Surgeon: Hillis Range, MD;  Location: [facility] CATH LAB;  Service: Cardiovascular;  Laterality: N/A;   Review of Systems  10 point systems review negative except as above.    Objective:   Physical Exam   BP 108/78 mmHg  Pulse 76  Temp(Src) 97.3 F (36.3 C)  Resp 16  Ht 5' 4.5" (1.638 m)  Wt 146 lb 9.6 oz (66.497 kg)  BMI 24.78 kg/m2  BP 108/78 sitting and 88/64 standing by Nurse  and BP 117/87, P79 sitting and BP 121/91, P57 standing by myself.  HEENT - Eac's patent. TM's Nl. EOM's full. PERRLA. NasoOroPharynx clear. Neck - supple. Nl Thyroid. Carotids 2+ & No bruits, nodes, JVD Chest - Clear equal BS w/o Rales, rhonchi, wheezes. Cor - Nl HS. RRR w/o sig MGR. PP 1(+). No edema. Abd - No palpable masses or tenderness. BS nl. MS- FROM w/o deformities. Muscle power, tone and bulk Nl. Gait Nl. Neuro - No obvious Cr N abnormalities. Sensory, motor and Cerebellar functions appear Nl w/o focal abnormalities.    Assessment & Plan:   1. Postural hypotension   2. Other fatigue   3. Dysuria  - Urinalysis, Routine w reflex microscopic  - Urine culture  4. Medication management  - CBC with Differential/Platelet - BASIC METABOLIC PANEL WITH GFR - Hepatic function panel - Magnesium - TSH

## 2016-02-17 ENCOUNTER — Other Ambulatory Visit: Payer: Self-pay | Admitting: Internal Medicine

## 2016-02-17 DIAGNOSIS — N390 Urinary tract infection, site not specified: Secondary | ICD-10-CM

## 2016-02-17 LAB — URINE CULTURE

## 2016-02-17 LAB — URINALYSIS, ROUTINE W REFLEX MICROSCOPIC
Bilirubin Urine: NEGATIVE
Glucose, UA: NEGATIVE
HGB URINE DIPSTICK: NEGATIVE
KETONES UR: NEGATIVE
NITRITE: NEGATIVE
PH: 6 (ref 5.0–8.0)
Protein, ur: NEGATIVE
Specific Gravity, Urine: 1.01 (ref 1.001–1.035)

## 2016-02-17 LAB — HEPATIC FUNCTION PANEL
ALK PHOS: 71 U/L (ref 33–130)
ALT: 13 U/L (ref 6–29)
AST: 16 U/L (ref 10–35)
Albumin: 4.4 g/dL (ref 3.6–5.1)
BILIRUBIN DIRECT: 0.1 mg/dL (ref <=0.2)
BILIRUBIN TOTAL: 0.5 mg/dL (ref 0.2–1.2)
Indirect Bilirubin: 0.4 mg/dL (ref 0.2–1.2)
Total Protein: 7 g/dL (ref 6.1–8.1)

## 2016-02-17 LAB — BASIC METABOLIC PANEL WITH GFR
BUN: 8 mg/dL (ref 7–25)
CHLORIDE: 103 mmol/L (ref 98–110)
CO2: 28 mmol/L (ref 20–31)
CREATININE: 0.76 mg/dL (ref 0.60–0.93)
Calcium: 9.8 mg/dL (ref 8.6–10.4)
GFR, EST AFRICAN AMERICAN: 89 mL/min (ref >=60)
GFR, Est Non African American: 77 mL/min (ref >=60)
Glucose, Bld: 100 mg/dL — ABNORMAL HIGH (ref 65–99)
POTASSIUM: 3.7 mmol/L (ref 3.5–5.3)
SODIUM: 142 mmol/L (ref 135–146)

## 2016-02-17 LAB — URINALYSIS, MICROSCOPIC ONLY
BACTERIA UA: NONE SEEN [HPF]
CRYSTALS: NONE SEEN [HPF]
Casts: NONE SEEN [LPF]
RBC / HPF: NONE SEEN RBC/HPF (ref <=2)
SQUAMOUS EPITHELIAL / LPF: NONE SEEN [HPF] (ref <=5)
Yeast: NONE SEEN [HPF]

## 2016-02-17 LAB — MAGNESIUM: MAGNESIUM: 2.2 mg/dL (ref 1.5–2.5)

## 2016-02-17 MED ORDER — AMOXICILLIN 500 MG PO CAPS: ORAL_CAPSULE | ORAL | Status: DC

## 2016-02-18 ENCOUNTER — Telehealth: Payer: Self-pay | Admitting: Internal Medicine

## 2016-02-18 NOTE — Telephone Encounter (Signed)
Informed patient that her last remote was on 4/4 and her device function was stable. She had some brief episodes of PAT and competitive pacing due to the sensor, but no significant episodes.   She said that she had spoken to her PCP about her generalized weakness, and tests were ran which r/o UTI, but she's still having the sx's. I advised her to inform her PCP that her symptoms are still there. Patient voiced understanding and plans to call.

## 2016-02-18 NOTE — Telephone Encounter (Signed)
Pt has questions about her device and would like to know when was the last checked.   Please give pt a call back.

## 2016-02-20 ENCOUNTER — Ambulatory Visit (HOSPITAL_COMMUNITY)
Admission: EM | Admit: 2016-02-20 | Discharge: 2016-02-20 | Disposition: A | Discharge status: Home / Self Care | Payer: Medicare PPO | Attending: Family Medicine | Admitting: Family Medicine

## 2016-02-20 ENCOUNTER — Other Ambulatory Visit: Payer: Self-pay | Admitting: Internal Medicine

## 2016-02-20 ENCOUNTER — Encounter (HOSPITAL_COMMUNITY): Payer: Self-pay | Admitting: Emergency Medicine

## 2016-02-20 DIAGNOSIS — F419 Anxiety disorder, unspecified: Secondary | ICD-10-CM

## 2016-02-20 DIAGNOSIS — R531 Weakness: Secondary | ICD-10-CM | POA: Diagnosis not present

## 2016-02-20 DIAGNOSIS — R110 Nausea: Secondary | ICD-10-CM

## 2016-02-20 MED ORDER — HYDROXYZINE HCL 25 MG PO TABS
25.0000 mg | ORAL_TABLET | Freq: Three times a day (TID) | ORAL | Status: DC | PRN
Start: 2016-02-20 — End: 2016-03-02

## 2016-02-20 NOTE — ED Notes (Signed)
[facility] a [facility] of nausea and weakness. The patient stated that she was at her PCP with the same complaint 4 days ago and was diagnosed with a UTI and prescribed amoxicillin that she is currently taken. The patient stated that she feels worse than before the treatment.

## 2016-02-20 NOTE — Discharge Instructions (Signed)
It was nice seeing you today. Your physical exam is fine. I am uncertain why you feel weak. I will recommend rest at home as well ad graded exercise. Also use hydroxyzine as needed for anxiety and nausea. F/U with us as needed.

## 2016-02-20 NOTE — ED Provider Notes (Addendum)
CSN: [id number]     Arrival date & time 02/20/16  1528 History   First MD Initiated Contact with Patient 02/20/16 1736     Chief Complaint  Patient presents with  . Nausea  . Weakness   (Consider location/radiation/quality/duration/timing/severity/associated sxs/prior Treatment) Patient is a 75 y.o. female presenting with weakness. The history is provided by the patient. No language interpreter was used.  Weakness This is a new problem. The current episode started more than 1 week ago. Pertinent negatives include no shortness of breath.  Patient been feeling weak for 1 week. She was seen by her PCP about 4 days ago where she got blood work done and was started on antibiotic for UTI. Patient stated since then she had not improved much. Nausea:In addition to weakness, she endorsed nausea for 1 week with no vomiting, no stomach ache, no change in bowel habit. Her appetite is poor. Anxiety:Patient feels her symptoms is due to anxiety and was wanting me to prescribed xanax to her. She stated she feels like she is going to pop out of her body. Denies depression. Not suicidal.  Past Medical History  Diagnosis Date  . Osteoporosis   . DI (detrusor instability)   . Atrophic vaginitis   . Anxiety   . Colon polyps   . H/O echocardiogram     a. Echo 11/2012:EF 50-55%, focal basal septal hypertrophy with some mitral valve SAM (mild) and MR.  This may be a sigmoid septum with advanced age or could be a variant of hypertrophic cardiomyopathy.  Marland Kitchen LBBB (left bundle branch block)   . Depression   . HTN (hypertension)   . Hyperlipidemia   . Chest pain     a. Adenosine sestamibi (1/14) with no evidence of ischemia or infarction, EF 63%.   . Complete heart block (HCC) 06-2013    status post pacemaker implantation by Dr Johney Frame 06-11-2013  . Migraines   . Prediabetes   . Vitamin D deficiency   . GERD (gastroesophageal reflux disease)     History + Hyplori via EGD  . Leukopenia 05/19/2014  . Lymphocytosis  05/19/2014   Past Surgical History  Procedure Laterality Date  . Breast surgery      Breast cyst  . Tubal ligation    . Hemorrhoid surgery    . Pacemaker insertion  06-11-2013    STJ dual chamber pacemaker implanted by Dr Johney Frame for complete heart block  . Left heart catheterization with coronary angiogram N/A 06/11/2013    Procedure: LEFT HEART CATHETERIZATION WITH CORONARY ANGIOGRAM;  Surgeon: Kathleene Hazel, MD;  Location: [facility] CATH LAB;  Service: Cardiovascular;  Laterality: N/A;  . Temporary pacemaker insertion N/A 06/11/2013    Procedure: TEMPORARY PACEMAKER INSERTION;  Surgeon: Kathleene Hazel, MD;  Location: [facility] CATH LAB;  Service: Cardiovascular;  Laterality: N/A;  . Permanent pacemaker insertion Left 06/11/2013    Procedure: PERMANENT PACEMAKER INSERTION;  Surgeon: Kathleene Hazel, MD;  Location: [facility] CATH LAB;  Service: Cardiovascular;  Laterality: Left;  . Permanent pacemaker insertion N/A 06/11/2013    Procedure: PERMANENT PACEMAKER INSERTION;  Surgeon: Hillis Range, MD;  Location: [facility] CATH LAB;  Service: Cardiovascular;  Laterality: N/A;   Family History  Problem Relation Age of Onset  . Diabetes Mother   . Heart disease Mother   . Diabetes Brother   . Hypertension Brother   . Heart disease Brother   . Breast cancer Sister 62  . Aneurysm Brother   . Stroke Brother   . Cancer  Father     prostate  . Diabetes Brother   . Heart disease Brother    Social History  Substance Use Topics  . Smoking status: Former Smoker    Types: Cigarettes    Quit date: 10/09/1975  . Smokeless tobacco: Never Used  . Alcohol Use: No   OB History    Gravida Para Term Preterm AB TAB SAB Ectopic Multiple Living   2 2 2       1      Review of Systems  Respiratory: Negative.  Negative for shortness of breath.   Cardiovascular: Negative.   Neurological: Positive for weakness. Negative for tremors and syncope.  Psychiatric/Behavioral: Negative for suicidal ideas and self-injury.  The patient is nervous/anxious.   All other systems reviewed and are negative.   Allergies  Actonel; Boniva; Evista; Fosamax; Levsin; Mobic; Topamax; and Zoloft  Home Medications   Prior to Admission medications   Medication Sig Start Date End Date Taking? Authorizing Provider  ALPRAZolam Prudy Feeler(XANAX) 1 MG tablet Take 0.5-1 mg by mouth 3 (three) times daily as needed for anxiety.   Yes Historical Provider, MD  amoxicillin (AMOXIL) 500 MG capsule Take 1 capsule 3 x day with meals for UTI 02/17/16  Yes Lucky CowboyWilliam [name], MD  Ascorbic Acid (VITAMIN C PO) Take 1 tablet by mouth daily.    Yes Historical Provider, MD  aspirin EC 81 MG tablet Take 81 mg by mouth daily.   Yes Historical Provider, MD  BIOTIN PO Take 1 tablet by mouth daily.    Yes Historical Provider, MD  Calcium Carbonate-Vitamin D (CALCIUM + D PO) Take 1 tablet by mouth daily.    Yes Historical Provider, MD  Cholecalciferol (VITAMIN D PO) Take 5,000 Units by mouth daily.    Yes Historical Provider, MD  Coenzyme Q10 (CO Q 10 PO) Take 1 capsule by mouth daily.   Yes Historical Provider, MD  FOLIC ACID PO Take 1 tablet by mouth daily.    Yes Historical Provider, MD  gabapentin (NEURONTIN) 600 MG tablet Take 1/2 to 1 tablet 3 to 4 x day as needed for neuralgia pain 12/09/15 06/07/16 Yes Lucky CowboyWilliam [name], MD  hydrochlorothiazide (HYDRODIURIL) 25 MG tablet Take 25 mg by mouth daily as needed (swelling).    Yes Historical Provider, MD  hydrocortisone (ANUSOL-HC) 2.5 % rectal cream Place 1 application rectally 2 (two) times daily. 04/29/14  Yes Melissa Smith, PA-C  MAGNESIUM PO Take 500 mg by mouth daily.    Yes Historical Provider, MD  metoprolol succinate (TOPROL XL) 25 MG 24 hr tablet Take 0.5 tablets (12.5 mg total) by mouth daily. 09/21/15  Yes Laurey Moralealton S McLean, MD  Multiple Vitamin (MULTIVITAMIN) tablet Take 1 tablet by mouth daily.   Yes Historical Provider, MD  ranitidine (ZANTAC) 300 MG tablet Take 300 mg by mouth daily as needed for heartburn.    Yes Historical Provider, MD  traMADol Janean Sark(ULTRAM) 50 MG tablet Take 1 tablet 4 x day if needed for severe pain 12/09/15 06/07/16  Lucky CowboyWilliam [name], MD  Tretinoin, Facial Wrinkles, (TRETINOIN, EMOLLIENT,) 0.05 % CREA APPLY TO AFFECTED AREA DAILY AS DIRECTED 12/10/15   Lucky CowboyWilliam [name], MD   Meds Ordered and Administered this Visit  Medications - No data to display  BP 150/94 mmHg  Pulse 65  Temp(Src) 97.8 F (36.6 C) (Oral)  SpO2 100% No data found.   Physical Exam  Constitutional: She is oriented to person, place, and time. She appears well-developed. No distress.  Cardiovascular: Normal rate, regular rhythm  and normal heart sounds.   No murmur heard. Pulmonary/Chest: Effort normal and breath sounds normal. No respiratory distress. She has no wheezes.  Abdominal: Soft. Bowel sounds are normal. She exhibits no distension and no mass. There is no tenderness.  Musculoskeletal: Normal range of motion. She exhibits no edema.  Neurological: She is alert and oriented to person, place, and time. No cranial nerve deficit.  Nursing note and vitals reviewed.   ED Course  Procedures (including critical care time)  Labs Review Labs Reviewed - No data to display  Imaging Review No results found.   Visual Acuity Review  Right Eye Distance:   Left Eye Distance:   Bilateral Distance:    Right Eye Near:   Left Eye Near:    Bilateral Near:     .ris   MDM  No diagnosis found. Weakness  Nausea  Anxiety   I reviewed lab work done by her PCP. Looks normal including TSH. UA just slightly positive for infection for which she is on A/B. At this time I recommended graded exercise and rest at home for weakness. F/U with PCP in 2 days for reassessment and possible PT referral. I recommended MVI in addition to regular meal.   Hydroxyzine prescribed prn nausea and anxiety. Return precaution discussed.    Doreene Eland, MD 02/20/16 1756  Doreene Eland, MD 02/20/16 1610

## 2016-02-25 ENCOUNTER — Telehealth: Payer: Self-pay | Admitting: *Deleted

## 2016-02-25 MED ORDER — CITALOPRAM HYDROBROMIDE 40 MG PO TABS: 40.0000 mg | ORAL_TABLET | Freq: Every day | ORAL | Status: DC

## 2016-02-25 NOTE — Telephone Encounter (Signed)
Patient called and states she is having anxiety problems again and asked to restart her Citalopram.  Per Dr Oneta RackMcKeown, send in an RX fpr Citalopram 40 mg daily.

## 2016-03-02 ENCOUNTER — Ambulatory Visit (INDEPENDENT_AMBULATORY_CARE_PROVIDER_SITE_OTHER): Payer: Medicare PPO | Admitting: Internal Medicine

## 2016-03-02 ENCOUNTER — Encounter: Payer: Self-pay | Admitting: Internal Medicine

## 2016-03-02 VITALS — BP 126/78 | HR 68 | Temp 97.3°F | Resp 16 | Ht 64.5 in | Wt 148.2 lb

## 2016-03-02 DIAGNOSIS — E559 Vitamin D deficiency, unspecified: Secondary | ICD-10-CM

## 2016-03-02 DIAGNOSIS — Z79899 Other long term (current) drug therapy: Secondary | ICD-10-CM

## 2016-03-02 DIAGNOSIS — R7309 Other abnormal glucose: Secondary | ICD-10-CM | POA: Diagnosis not present

## 2016-03-02 DIAGNOSIS — R7303 Prediabetes: Secondary | ICD-10-CM

## 2016-03-02 DIAGNOSIS — E785 Hyperlipidemia, unspecified: Secondary | ICD-10-CM

## 2016-03-02 DIAGNOSIS — F411 Generalized anxiety disorder: Secondary | ICD-10-CM | POA: Diagnosis not present

## 2016-03-02 DIAGNOSIS — I10 Essential (primary) hypertension: Secondary | ICD-10-CM

## 2016-03-02 LAB — LIPID PANEL
CHOLESTEROL: 167 mg/dL (ref 125–200)
HDL: 61 mg/dL (ref >=46)
LDL Cholesterol: 95 mg/dL (ref <130)
TRIGLYCERIDES: 55 mg/dL (ref <150)
Total CHOL/HDL Ratio: 2.7 Ratio (ref <=5.0)
VLDL: 11 mg/dL (ref <30)

## 2016-03-02 LAB — HEMOGLOBIN A1C
Hgb A1c MFr Bld: 5.6 % (ref <5.7)
Mean Plasma Glucose: 114 mg/dL

## 2016-03-02 MED ORDER — HYDROXYZINE HCL 25 MG PO TABS: ORAL_TABLET | ORAL | Status: AC

## 2016-03-02 NOTE — Progress Notes (Signed)
Patient ID: Alexandra Wheeler, female   DOB: 07-Sep-1941, 75 y.o.   MRN: [id number]   This very nice 75 y.o. [facility] presents for  follow up with Labile Hypertension, Hyperlipidemia, Pre-Diabetes and Vitamin D Deficiency. Patient reports being a lot of stress selling her home and purchasing a home with her daughter & son-in-law.    Patient is monitored expectantly for Labile HTN x years & BP has been controlled with today's BP: 126/78 mmHg. Patient has had no complaints of any cardiac type chest pain, palpitations, dyspnea/orthopnea/PND, dizziness, claudication, or dependent edema.   Hyperlipidemia is controlled with diet. Patient denies myalgias or other med SE's. Last Lipids were at goal with Cholesterol 147; HDL 57; LDL 78; Triglycerides 59 on 09/01/2015.   Also, the patient has history of PreDiabetes since 2011 with A1c 5.9% and she has had no symptoms of reactive hypoglycemia, diabetic polys, paresthesias or visual blurring.  Last A1c was 5.8% on 09/01/2015.     Further, the patient also has history of Vitamin D Deficiency of "34" in 2008 and supplements vitamin D without any suspected side-effects. Last vitamin D was 80 on 09/01/2015.  Medication Sig  . ALPRAZolam  1 MG  Take 0.5-1 mg by mouth 3 (three) times daily as needed for anxiety.  Marland Kitchen VITAMIN C  Take 1 tablet by mouth daily.   Marland Kitchen aspirin EC 81 MG  Take 81 mg by mouth daily.  Marland Kitchen BIOTIN  Take 1 tablet by mouth daily.   Marland Kitchen CALCIUM + D  Take 1 tablet by mouth daily.   Marland Kitchen VITAMIN D Take 5,000 Units by mouth daily.   . citalopram  40 MG Take 1 tablet (40 mg total) by mouth daily.  Marland Kitchen Co-Q10 Take 1 capsule by mouth daily.  Marland Kitchen FOLIC ACID Take 1 tablet by mouth daily.   Marland Kitchen gabapentin  600 MG  Take 1/2 to 1 tablet 3 to 4 x day as needed for neuralgia pain  . hctz 25 MG tablet Take 25 mg by mouth daily as needed (swelling).   . ANUSOL-HC 2.5 % rec  crm Place 1 application rectally 2 (two) times daily.  . hydrOXYzine  25 MG tablet Take 1 tablet (25 mg  total) by mouth 3 (three) times daily as needed for anxiety or nausea.  Marland Kitchen MAGNESIUM  Take 500 mg by mouth daily.   . metoprolol succinate-XL 25 MG  Take 0.5 tablets (12.5 mg total) by mouth daily.  . Multiple Vitamin  Take 1 tablet by mouth daily.  . ranitidine 300 MG tablet Take 300 mg by mouth daily as needed for heartburn.  . traMADol  50 MG tablet Take 1 tablet 4 x day if needed for severe pain  . TRETINOIN 0.05 % CREA APPLY TO AFFECTED AREA DAILY AS DIRECTED   Allergies  Allergen Reactions  . Actonel [Risedronate Sodium]     Pt didn't like the way it made her feel   . Boniva [Ibandronic Acid]     Pt didn't like the way it made her feel  . Evista [Raloxifene]     Pt didn't like the way it made her feel  . Fosamax [Alendronate Sodium]     Pt didn't like the way it made her feel   . Levsin [Hyoscyamine Sulfate]     unknown  . Mobic [Meloxicam]     unknown   . Topamax [Topiramate]     unknown   . Zoloft [Sertraline Hcl]     unknown  PMHx:   Past Medical History  Diagnosis Date  . Osteoporosis   . DI (detrusor instability)   . Atrophic vaginitis   . Anxiety   . Colon polyps   . H/O echocardiogram     a. Echo 11/2012:EF 50-55%, focal basal septal hypertrophy with some mitral valve SAM (mild) and MR.  This may be a sigmoid septum with advanced age or could be a variant of hypertrophic cardiomyopathy.  Marland Kitchen. LBBB (left bundle branch block)   . Depression   . HTN (hypertension)   . Hyperlipidemia   . Chest pain     a. Adenosine sestamibi (1/14) with no evidence of ischemia or infarction, EF 63%.   . Complete heart block (HCC) 06-2013    status post pacemaker implantation by Dr Johney FrameAllred 06-11-2013  . Migraines   . Prediabetes   . Vitamin D deficiency   . GERD (gastroesophageal reflux disease)     History + Hyplori via EGD  . Leukopenia 05/19/2014  . Lymphocytosis 05/19/2014   Immunization History  Administered Date(s) Administered  . Influenza Split 08/06/2014  .  Influenza, High Dose Seasonal PF 09/01/2015  . Pneumococcal Conjugate-13 09/01/2015  . Pneumococcal-Unspecified 10/08/2010  . Td 05/09/2011  . Zoster 05/09/2011   Past Surgical History  Procedure Laterality Date  . Breast surgery      Breast cyst  . Tubal ligation    . Hemorrhoid surgery    . Pacemaker insertion  06-11-2013    STJ dual chamber pacemaker implanted by Dr Johney FrameAllred for complete heart block  . Left heart catheterization with coronary angiogram N/A 06/11/2013    Procedure: LEFT HEART CATHETERIZATION WITH CORONARY ANGIOGRAM;  Surgeon: Kathleene Hazelhristopher D McAlhany, MD;  [facility];  Service: Cardiovascular;  Laterality: N/A;  . Temporary pacemaker insertion N/A 06/11/2013    Procedure: TEMPORARY PACEMAKER INSERTION;  Surgeon: Kathleene Hazelhristopher D McAlhany, MD;  [facility];  Service: Cardiovascular;  Laterality: N/A;  . Permanent pacemaker insertion Left 06/11/2013    Procedure: PERMANENT PACEMAKER INSERTION;  Surgeon: Kathleene Hazelhristopher D McAlhany, MD;  [facility];  Service: Cardiovascular;  Laterality: Left;  . Permanent pacemaker insertion N/A 06/11/2013    Procedure: PERMANENT PACEMAKER INSERTION;  Surgeon: Hillis RangeJames Allred, MD;  [facility];  Service: Cardiovascular;  Laterality: N/A;   FHx:    Reviewed / unchanged  SHx:    Reviewed / unchanged  Systems Review:  Constitutional: Denies fever, chills, wt changes, headaches, insomnia, fatigue, night sweats, change in appetite. Eyes: Denies redness, blurred vision, diplopia, discharge, itchy, watery eyes.  ENT: Denies discharge, congestion, post nasal drip, epistaxis, sore throat, earache, hearing loss, dental pain, tinnitus, vertigo, sinus pain, snoring.  CV: Denies chest pain, palpitations, irregular heartbeat, syncope, dyspnea, diaphoresis, orthopnea, PND, claudication or edema. Respiratory: denies cough, dyspnea, DOE, pleurisy, hoarseness, laryngitis, wheezing.  Gastrointestinal: Denies dysphagia, odynophagia,  heartburn, reflux, water brash, abdominal pain or cramps, nausea, vomiting, bloating, diarrhea, constipation, hematemesis, melena, hematochezia  or hemorrhoids. Genitourinary: Denies dysuria, frequency, urgency, nocturia, hesitancy, discharge, hematuria or flank pain. Musculoskeletal: Denies arthralgias, myalgias, stiffness, jt. swelling, pain, limping or strain/sprain.  Skin: Denies pruritus, rash, hives, warts, acne, eczema or change in skin lesion(s). Neuro: No weakness, tremor, incoordination, spasms, paresthesia or pain. Psychiatric: Denies confusion, memory loss or sensory loss. Endo: Denies change in weight, skin or hair change.  Heme/Lymph: No excessive bleeding, bruising or enlarged lymph nodes.  Physical Exam  BP 126/78 mmHg  Pulse 68  Temp(Src) 97.3 F (36.3 C)  Resp 16  Ht 5' 4.5" (1.638 m)  Wt 148 lb 3.2 oz (67.223 kg)  BMI 25.05 kg/m2  Appears well nourished and in no distress. Eyes: PERRLA, EOMs, conjunctiva no swelling or erythema. Sinuses: No frontal/maxillary tenderness ENT/Mouth: EAC's clear, TM's nl w/o erythema, bulging. Nares clear w/o erythema, swelling, exudates. Oropharynx clear without erythema or exudates. Oral hygiene is good. Tongue normal, non obstructing. Hearing intact.  Neck: Supple. Thyroid nl. Car 2+/2+ without bruits, nodes or JVD. Chest: Respirations nl with BS clear & equal w/o rales, rhonchi, wheezing or stridor.  Cor: Heart sounds normal w/ regular rate and rhythm without sig. murmurs, gallops, clicks, or rubs. Peripheral pulses normal and equal  without edema.  Abdomen: Soft & bowel sounds normal. Non-tender w/o guarding, rebound, hernias, masses, or organomegaly.  Lymphatics: Unremarkable.  Musculoskeletal: Full ROM all peripheral extremities, joint stability, 5/5 strength, and normal gait.  Skin: Warm, dry without exposed rashes, lesions or ecchymosis apparent.  Neuro: Cranial nerves intact, reflexes equal bilaterally. Sensory-motor testing  grossly intact. Tendon reflexes grossly intact.  Pysch: Alert & oriented x 3.  Insight and judgement nl & appropriate. No ideations.  Assessment and Plan:  1. Hypertension, labile    2. Hyperlipidemia  - Lipid panel  3. Prediabetes  - Hemoglobin A1c - Insulin, random  4. Vitamin D deficiency  - VITAMIN D 25 Hydroxy   5. Medication management   Recommended regular exercise, BP monitoring, weight control, and discussed med and SE's. Recommended labs to assess and monitor clinical status. Further disposition pending results of labs. Over 30 minutes of exam, counseling, chart review was performed

## 2016-03-02 NOTE — Patient Instructions (Signed)

## 2016-03-03 LAB — VITAMIN D 25 HYDROXY (VIT D DEFICIENCY, FRACTURES): Vit D, 25-Hydroxy: 63 ng/mL (ref 30–100)

## 2016-03-03 LAB — INSULIN, RANDOM: INSULIN: 9.5 u[IU]/mL (ref 2.0–19.6)

## 2016-03-30 ENCOUNTER — Encounter: Payer: Self-pay | Admitting: Cardiology

## 2016-03-30 LAB — CUP PACEART REMOTE DEVICE CHECK
Battery Voltage: 2.99 V
Brady Statistic AP VP Percent: 58 %
Brady Statistic AS VP Percent: 42 %
Brady Statistic AS VS Percent: 1 %
Date Time Interrogation Session: 20170404060018
Implantable Lead Location: 753859
Implantable Lead Location: 753860
Implantable Lead Model: 1944
Implantable Lead Model: 1948
Lead Channel Impedance Value: 630 Ohm
Lead Channel Pacing Threshold Amplitude: 0.25 V
Lead Channel Pacing Threshold Amplitude: 0.875 V
Lead Channel Pacing Threshold Pulse Width: 0.4 ms
Lead Channel Sensing Intrinsic Amplitude: 12 mV
Lead Channel Setting Sensing Sensitivity: 4 mV
MDC IDC LEAD IMPLANT DT: 20140805
MDC IDC LEAD IMPLANT DT: 20140805
MDC IDC MSMT BATTERY REMAINING LONGEVITY: 128 mo
MDC IDC MSMT BATTERY REMAINING PERCENTAGE: 95.5 %
MDC IDC MSMT LEADCHNL RA IMPEDANCE VALUE: 580 Ohm
MDC IDC MSMT LEADCHNL RA PACING THRESHOLD PULSEWIDTH: 0.4 ms
MDC IDC MSMT LEADCHNL RA SENSING INTR AMPL: 2.6 mV
MDC IDC SET LEADCHNL RA PACING AMPLITUDE: 2 V
MDC IDC SET LEADCHNL RV PACING AMPLITUDE: 1.125
MDC IDC SET LEADCHNL RV PACING PULSEWIDTH: 0.4 ms
MDC IDC STAT BRADY AP VS PERCENT: 1 %
MDC IDC STAT BRADY RA PERCENT PACED: 57 %
MDC IDC STAT BRADY RV PERCENT PACED: 99 %
Pulse Gen Model: 2240
Pulse Gen Serial Number: 7523768

## 2016-04-05 ENCOUNTER — Other Ambulatory Visit: Payer: Self-pay | Admitting: Emergency Medicine

## 2016-04-13 ENCOUNTER — Encounter: Payer: Self-pay | Admitting: Cardiology

## 2016-04-14 ENCOUNTER — Ambulatory Visit: Payer: Self-pay | Admitting: Internal Medicine

## 2016-04-26 ENCOUNTER — Ambulatory Visit: Payer: Medicare PPO | Admitting: Hematology and Oncology

## 2016-04-26 ENCOUNTER — Other Ambulatory Visit: Payer: Medicare PPO

## 2016-05-11 ENCOUNTER — Ambulatory Visit (INDEPENDENT_AMBULATORY_CARE_PROVIDER_SITE_OTHER): Payer: Medicare PPO | Admitting: *Deleted

## 2016-05-11 DIAGNOSIS — I442 Atrioventricular block, complete: Secondary | ICD-10-CM | POA: Diagnosis not present

## 2016-05-11 NOTE — Progress Notes (Signed)
Remote pacemaker transmission.   

## 2016-05-17 LAB — CUP PACEART REMOTE DEVICE CHECK
Brady Statistic AP VP Percent: 63 %
Brady Statistic AP VS Percent: 1 %
Brady Statistic AS VP Percent: 36 %
Brady Statistic AS VS Percent: 1 %
Date Time Interrogation Session: 20170705060016
Implantable Lead Implant Date: 20140805
Lead Channel Impedance Value: 610 Ohm
Lead Channel Pacing Threshold Amplitude: 0.875 V
Lead Channel Pacing Threshold Pulse Width: 0.4 ms
Lead Channel Sensing Intrinsic Amplitude: 12 mV
Lead Channel Sensing Intrinsic Amplitude: 2.3 mV
Lead Channel Setting Pacing Pulse Width: 0.4 ms
Lead Channel Setting Sensing Sensitivity: 4 mV
MDC IDC LEAD IMPLANT DT: 20140805
MDC IDC LEAD LOCATION: 753859
MDC IDC LEAD LOCATION: 753860
MDC IDC LEAD MODEL: 1944
MDC IDC LEAD MODEL: 1948
MDC IDC MSMT BATTERY REMAINING LONGEVITY: 125 mo
MDC IDC MSMT BATTERY REMAINING PERCENTAGE: 95.5 %
MDC IDC MSMT BATTERY VOLTAGE: 2.99 V
MDC IDC MSMT LEADCHNL RA IMPEDANCE VALUE: 540 Ohm
MDC IDC MSMT LEADCHNL RA PACING THRESHOLD AMPLITUDE: 0.25 V
MDC IDC MSMT LEADCHNL RA PACING THRESHOLD PULSEWIDTH: 0.4 ms
MDC IDC SET LEADCHNL RA PACING AMPLITUDE: 2 V
MDC IDC SET LEADCHNL RV PACING AMPLITUDE: 1.125
MDC IDC STAT BRADY RA PERCENT PACED: 63 %
MDC IDC STAT BRADY RV PERCENT PACED: 99 %
Pulse Gen Model: 2240
Pulse Gen Serial Number: 7523768

## 2016-05-18 ENCOUNTER — Encounter: Payer: Self-pay | Admitting: Cardiology

## 2016-06-01 ENCOUNTER — Ambulatory Visit: Payer: Self-pay | Admitting: Physician Assistant

## 2016-06-07 ENCOUNTER — Ambulatory Visit: Payer: Self-pay | Admitting: Physician Assistant

## 2016-06-16 ENCOUNTER — Other Ambulatory Visit: Payer: Self-pay | Admitting: Internal Medicine

## 2016-07-28 ENCOUNTER — Other Ambulatory Visit: Payer: Self-pay | Admitting: Internal Medicine

## 2016-07-28 DIAGNOSIS — I10 Essential (primary) hypertension: Secondary | ICD-10-CM

## 2016-07-28 MED ORDER — METOPROLOL SUCCINATE ER 50 MG PO TB24: ORAL_TABLET | ORAL | 1 refills | Status: DC

## 2016-07-28 MED ORDER — LISINOPRIL 20 MG PO TABS: ORAL_TABLET | ORAL | 1 refills | Status: DC

## 2016-07-28 NOTE — Progress Notes (Signed)
  S - Patient called re: elevated BP 158/101  A- HTN  P New Rx Lisinopril 20 mg qPM  &  Increase Metoprolol Succ 50 mg qAM

## 2016-08-19 DIAGNOSIS — Z23 Encounter for immunization: Secondary | ICD-10-CM | POA: Diagnosis not present

## 2016-08-19 DIAGNOSIS — M47812 Spondylosis without myelopathy or radiculopathy, cervical region: Secondary | ICD-10-CM | POA: Diagnosis not present

## 2016-08-27 ENCOUNTER — Other Ambulatory Visit: Payer: Self-pay | Admitting: Internal Medicine

## 2016-08-30 DIAGNOSIS — M47812 Spondylosis without myelopathy or radiculopathy, cervical region: Secondary | ICD-10-CM | POA: Diagnosis not present

## 2016-09-27 ENCOUNTER — Ambulatory Visit (INDEPENDENT_AMBULATORY_CARE_PROVIDER_SITE_OTHER): Payer: Medicare PPO | Admitting: Internal Medicine

## 2016-09-27 ENCOUNTER — Encounter: Payer: Self-pay | Admitting: Internal Medicine

## 2016-09-27 VITALS — BP 120/84 | HR 60 | Temp 97.2°F | Resp 16 | Ht 63.75 in | Wt 152.4 lb

## 2016-09-27 DIAGNOSIS — Z0001 Encounter for general adult medical examination with abnormal findings: Secondary | ICD-10-CM

## 2016-09-27 DIAGNOSIS — R7303 Prediabetes: Secondary | ICD-10-CM

## 2016-09-27 DIAGNOSIS — Z0000 Encounter for general adult medical examination without abnormal findings: Secondary | ICD-10-CM

## 2016-09-27 DIAGNOSIS — I10 Essential (primary) hypertension: Secondary | ICD-10-CM | POA: Diagnosis not present

## 2016-09-27 DIAGNOSIS — K219 Gastro-esophageal reflux disease without esophagitis: Secondary | ICD-10-CM

## 2016-09-27 DIAGNOSIS — Z79899 Other long term (current) drug therapy: Secondary | ICD-10-CM | POA: Diagnosis not present

## 2016-09-27 DIAGNOSIS — E782 Mixed hyperlipidemia: Secondary | ICD-10-CM

## 2016-09-27 DIAGNOSIS — Z136 Encounter for screening for cardiovascular disorders: Secondary | ICD-10-CM | POA: Diagnosis not present

## 2016-09-27 DIAGNOSIS — E559 Vitamin D deficiency, unspecified: Secondary | ICD-10-CM

## 2016-09-27 DIAGNOSIS — Z1212 Encounter for screening for malignant neoplasm of rectum: Secondary | ICD-10-CM

## 2016-09-27 DIAGNOSIS — I442 Atrioventricular block, complete: Secondary | ICD-10-CM

## 2016-09-27 LAB — CBC WITH DIFFERENTIAL/PLATELET
BASOS PCT: 1 %
Basophils Absolute: 40 cells/uL (ref 0–200)
EOS ABS: 160 {cells}/uL (ref 15–500)
Eosinophils Relative: 4 %
HEMATOCRIT: 43.2 % (ref 35.0–45.0)
HEMOGLOBIN: 13.2 g/dL (ref 11.7–15.5)
LYMPHS ABS: 1880 {cells}/uL (ref 850–3900)
Lymphocytes Relative: 47 %
MCH: 27.7 pg (ref 27.0–33.0)
MCHC: 30.6 g/dL — ABNORMAL LOW (ref 32.0–36.0)
MCV: 90.8 fL (ref 80.0–100.0)
MONO ABS: 320 {cells}/uL (ref 200–950)
MPV: 9.8 fL (ref 7.5–12.5)
Monocytes Relative: 8 %
NEUTROS PCT: 40 %
Neutro Abs: 1600 cells/uL (ref 1500–7800)
Platelets: 222 10*3/uL (ref 140–400)
RBC: 4.76 MIL/uL (ref 3.80–5.10)
RDW: 13.8 % (ref 11.0–15.0)
WBC: 4 10*3/uL (ref 3.8–10.8)

## 2016-09-27 LAB — LIPID PANEL
Cholesterol: 170 mg/dL (ref <200)
HDL: 62 mg/dL (ref >50)
LDL CALC: 91 mg/dL (ref <100)
Total CHOL/HDL Ratio: 2.7 Ratio (ref <5.0)
Triglycerides: 84 mg/dL (ref <150)
VLDL: 17 mg/dL (ref <30)

## 2016-09-27 LAB — BASIC METABOLIC PANEL WITH GFR
BUN: 10 mg/dL (ref 7–25)
CO2: 27 mmol/L (ref 20–31)
Calcium: 9.2 mg/dL (ref 8.6–10.4)
Chloride: 104 mmol/L (ref 98–110)
Creat: 0.88 mg/dL (ref 0.60–0.93)
GFR, EST NON AFRICAN AMERICAN: 64 mL/min (ref >=60)
GFR, Est African American: 74 mL/min (ref >=60)
GLUCOSE: 90 mg/dL (ref 65–99)
POTASSIUM: 4 mmol/L (ref 3.5–5.3)
Sodium: 141 mmol/L (ref 135–146)

## 2016-09-27 LAB — HEPATIC FUNCTION PANEL
ALK PHOS: 76 U/L (ref 33–130)
ALT: 14 U/L (ref 6–29)
AST: 18 U/L (ref 10–35)
Albumin: 4.1 g/dL (ref 3.6–5.1)
BILIRUBIN INDIRECT: 0.3 mg/dL (ref 0.2–1.2)
Bilirubin, Direct: 0.1 mg/dL (ref <=0.2)
TOTAL PROTEIN: 6.7 g/dL (ref 6.1–8.1)
Total Bilirubin: 0.4 mg/dL (ref 0.2–1.2)

## 2016-09-27 LAB — HEMOGLOBIN A1C
HEMOGLOBIN A1C: 5.5 % (ref <5.7)
MEAN PLASMA GLUCOSE: 111 mg/dL

## 2016-09-27 LAB — MAGNESIUM: Magnesium: 2.2 mg/dL (ref 1.5–2.5)

## 2016-09-27 LAB — TSH: TSH: 1.05 m[IU]/L

## 2016-09-27 NOTE — Patient Instructions (Signed)

## 2016-09-27 NOTE — Progress Notes (Signed)
ADULT & ADOLESCENT INTERNAL MEDICINE Lucky CowboyWilliam Tannen Vandezande, M.D.    Dyanne CarrelAmanda R. Steffanie Dunnollier, P.A.-C      Terri Piedraourtney Forcucci, P.A.-C  [facility]                [address]                [place], [place] [id number] Telephone [phone] Telefax [phone]  Annual Screening/Preventative Visit & Comprehensive Evaluation &  Examination      This very nice 75 [facility] for a Screening/Preventative Visit & comprehensive evaluation and management of multiple medical co-morbidities.  Patient has been followed for labile HTN, Prediabetes, Hyperlipidemia and Vitamin D Deficiency. Patient also has GERD controlled with diet and infrequently taking her Ranitidine.      Patient is treated for HTN. In Aug 2014, patient presented asymptomatic with CHB & had a PPM implanted by Dr Johney FrameAllred. Patient's BP has been controlled at home and patient denies any cardiac symptoms as chest pain, palpitations, shortness of breath, dizziness or ankle swelling. Today's BP is  120/84.      Patient's hyperlipidemia is controlled with diet. Last lipids were at goal: Lab Results  Component Value Date   CHOL 167 03/02/2016   HDL 61 03/02/2016   LDLCALC 95 03/02/2016   TRIG 55 03/02/2016   CHOLHDL 2.7 03/02/2016      Patient has prediabetes with A1c 5.9% predating circa 2011 and 5.8% in 2016 and patient denies reactive hypoglycemic symptoms, visual blurring, diabetic polys, or paresthesias. Last A1c was at goal: Lab Results  Component Value Date   HGBA1C 5.6 03/02/2016      Finally, patient has history of Vitamin D Deficiency in 2008 of "34"  and last Vitamin D was at goal: Lab Results  Component Value Date   VD25OH 63 03/02/2016   Current Outpatient Prescriptions on File Prior to Visit  Medication Sig  . ALPRAZolam (XANAX) 1 MG tablet TAKE 1/2 TO 1 TABLET THREE TIMES DAILY AS NEEDED FOR ANXIETY  . Ascorbic Acid (VITAMIN C PO) Take 1 tablet by mouth daily.   Marland Kitchen. aspirin EC 81  MG tablet Take 81 mg by mouth daily.  Marland Kitchen. BIOTIN  Take 1 tablet by mouth daily.   Marland Kitchen. CALCIUM + D Take 1 tablet by mouth daily.    Vitamin D  Take 5,000 Units by mouth daily.   . citalopram (40 MG tablet TAKE 1 TABLET (40 MG TOTAL) BY MOUTH DAILY.  . CO Q 10  Take 1 capsule by mouth daily.  Marland Kitchen. FOLIC ACID  Take 1 tablet by mouth daily.   Marland Kitchen. gabapentin600 MG tablet Take 1/2 to 1 tablet 3 to 4 x day as needed for neuralgia pain  . hctz 25 MG tablet Take 25 mg by mouth daily as needed (swelling).   Marland Kitchen. lisinopril  20 MG tablet Take 1 tablet daily for BP  . MAGNESIUM Take 500 mg by mouth daily.   . metoprolol succinate-XL 50 MG  Take 1 tablet daily for BP  . Multiple Vitamin Take 1 tablet by mouth daily.  Marland Kitchen. PROCTOZONE-HC 2.5 %  cream PLACE 1 APPLICATION RECTALLY 2 (TWO) TIMES DAILY.  . ranitidine300 MG tablet Take 300 mg by mouth daily as needed for heartburn.  . Tretinoin 0.05 % CRM APPLY TO AFFECTED AREA DAILY AS DIRECTED   Allergies  Allergen Reactions  . Actonel [Risedronate Sodium]     Pt didn't like the way it made her feel   .  Boniva [Ibandronic Acid]     Pt didn't like the way it made her feel  . Evista [Raloxifene]     Pt didn't like the way it made her feel  . Fosamax [Alendronate Sodium]     Pt didn't like the way it made her feel   . Levsin [Hyoscyamine Sulfate]     unknown  . Mobic [Meloxicam]     unknown   . Topamax [Topiramate]     unknown   . Zoloft [Sertraline Hcl]     unknown    Past Medical History:  Diagnosis Date  . Anxiety   . Atrophic vaginitis   . Chest pain    a. Adenosine sestamibi (1/14) with no evidence of ischemia or infarction, EF 63%.   . Colon polyps   . Complete heart block (HCC) 06-2013   status post pacemaker implantation by Dr Johney FrameAllred 06-11-2013  . Depression   . DI (detrusor instability)   . GERD (gastroesophageal reflux disease)    History + Hyplori via EGD  . H/O echocardiogram    a. Echo 11/2012:EF 50-55%, focal basal septal hypertrophy  with some mitral valve SAM (mild) and MR.  This may be a sigmoid septum with advanced age or could be a variant of hypertrophic cardiomyopathy.  Marland Kitchen. HTN (hypertension)   . Hyperlipidemia   . LBBB (left bundle branch block)   . Leukopenia 05/19/2014  . Lymphocytosis 05/19/2014  . Migraines   . Osteoporosis   . Prediabetes   . Vitamin D deficiency    Health Maintenance  Topic Date Due  . INFLUENZA VACCINE  06/07/2016  . TETANUS/TDAP  05/08/2021  . DEXA SCAN  Completed  . ZOSTAVAX  Completed  . PNA vac Low Risk Adult  Completed   Immunization History  Administered Date(s) Administered  . Influenza Split 08/06/2014  . Influenza, High Dose Seasonal PF 09/01/2015  . Pneumococcal Conjugate-13 09/01/2015  . Pneumococcal-Unspecified 10/08/2010  . Td 05/09/2011  . Zoster 05/09/2011   Past Surgical History:  Procedure Laterality Date  . BREAST SURGERY     Breast cyst  . HEMORRHOID SURGERY    . LEFT HEART CATHETERIZATION WITH CORONARY ANGIOGRAM N/A 06/11/2013   Procedure: LEFT HEART CATHETERIZATION WITH CORONARY ANGIOGRAM;  Surgeon: Kathleene Hazelhristopher D McAlhany, MD;  [facility];  Service: Cardiovascular;  Laterality: N/A;  . PACEMAKER INSERTION  06-11-2013   STJ dual chamber pacemaker implanted by Dr Johney FrameAllred for complete heart block  . PERMANENT PACEMAKER INSERTION Left 06/11/2013   Procedure: PERMANENT PACEMAKER INSERTION;  Surgeon: Kathleene Hazelhristopher D McAlhany, MD;  [facility];  Service: Cardiovascular;  Laterality: Left;  . PERMANENT PACEMAKER INSERTION N/A 06/11/2013   Procedure: PERMANENT PACEMAKER INSERTION;  Surgeon: Hillis RangeJames Allred, MD;  [facility];  Service: Cardiovascular;  Laterality: N/A;  . TEMPORARY PACEMAKER INSERTION N/A 06/11/2013   Procedure: TEMPORARY PACEMAKER INSERTION;  Surgeon: Kathleene Hazelhristopher D McAlhany, MD;  [facility];  Service: Cardiovascular;  Laterality: N/A;  . TUBAL LIGATION     Family History  Problem Relation Age of Onset  . Diabetes  Mother   . Heart disease Mother   . Diabetes Brother   . Hypertension Brother   . Heart disease Brother   . Breast cancer Sister [id number]  . Aneurysm Brother   . Stroke Brother   . Cancer Father     prostate  . Diabetes Brother   . Heart disease Brother    Social History  Substance Use Topics  . Smoking  status: Former Smoker    Types: Cigarettes    Quit date: 10/09/1975  . Smokeless tobacco: Never Used  . Alcohol use No    ROS Constitutional: Denies fever, chills, weight loss/gain, headaches, insomnia,  night sweats, and change in appetite. Does c/o fatigue. Eyes: Denies redness, blurred vision, diplopia, discharge, itchy, watery eyes.  ENT: Denies discharge, congestion, post nasal drip, epistaxis, sore throat, earache, hearing loss, dental pain, Tinnitus, Vertigo, Sinus pain, snoring.  Cardio: Denies chest pain, palpitations, irregular heartbeat, syncope, dyspnea, diaphoresis, orthopnea, PND, claudication, edema Respiratory: denies cough, dyspnea, DOE, pleurisy, hoarseness, laryngitis, wheezing.  Gastrointestinal: Denies dysphagia, heartburn, reflux, water brash, pain, cramps, nausea, vomiting, bloating, diarrhea, constipation, hematemesis, melena, hematochezia, jaundice, hemorrhoids Genitourinary: Denies dysuria, frequency, urgency, nocturia, hesitancy, discharge, hematuria, flank pain Breast: Breast lumps, nipple discharge, bleeding.  Musculoskeletal: Denies arthralgia, myalgia, stiffness, Jt. Swelling, pain, limp, and strain/sprain. Denies falls. Skin: Denies puritis, rash, hives, warts, acne, eczema, changing in skin lesion Neuro: No weakness, tremor, incoordination, spasms, paresthesia, pain Psychiatric: Denies confusion, memory loss, sensory loss. Denies Depression. Endocrine: Denies change in weight, skin, hair change, nocturia, and paresthesia, diabetic polys, visual blurring, hyper / hypo glycemic episodes.  Heme/Lymph: No excessive bleeding, bruising, enlarged lymph  nodes.  Physical Exam  BP 120/84   Pulse 60   Temp 97.2 F (36.2 C)   Resp 16   Ht 5' 3.75" (1.619 m)   Wt 152 lb 6.4 oz (69.1 kg)   BMI 26.36 kg/m   General Appearance: Well nourished and in no apparent distress.  Eyes: PERRLA, EOMs, conjunctiva no swelling or erythema, normal fundi and vessels. Sinuses: No frontal/maxillary tenderness ENT/Mouth: EACs patent / TMs  nl. Nares clear without erythema, swelling, mucoid exudates. Oral hygiene is good. No erythema, swelling, or exudate. Tongue normal, non-obstructing. Tonsils not swollen or erythematous. Hearing normal.  Neck: Supple, thyroid normal. No bruits, nodes or JVD. Respiratory: Respiratory effort normal.  BS equal and clear bilateral without rales, rhonci, wheezing or stridor. Cardio: Heart sounds are normal with regular rate and rhythm and no murmurs, rubs or gallops. Peripheral pulses are normal and equal bilaterally without edema. No aortic or femoral bruits. Chest: symmetric with normal excursions and percussion. Breasts: Symmetric, without lumps, nipple discharge, retractions, or fibrocystic changes.  Abdomen: Flat, soft with bowel sounds active. Nontender, no guarding, rebound, hernias, masses, or organomegaly.  Lymphatics: Non tender without lymphadenopathy.  Genitourinary:  Musculoskeletal: Full ROM all peripheral extremities, joint stability, 5/5 strength, and normal gait. Skin: Warm and dry without rashes, lesions, cyanosis, clubbing or  ecchymosis.  Neuro: Cranial nerves intact, reflexes equal bilaterally. Normal muscle tone, no cerebellar symptoms. Sensation intact.  Pysch: Alert and oriented X 3, normal affect, Insight and Judgment appropriate.   Assessment and Plan  1. Annual Preventative Screening Examination  - Microalbumin / creatinine urine ratio - EKG 12-Lead - Urinalysis, Routine w reflex microscopic  - CBC with Differential/Platelet - BASIC METABOLIC PANEL WITH GFR - Hepatic function panel -  Magnesium - Lipid panel - TSH - Hemoglobin A1c - Insulin, random - VITAMIN D 25 Hydroxy   2. Essential hypertension  - Microalbumin / creatinine urine ratio - EKG 12-Lead - TSH  3. Mixed hyperlipidemia  - Lipid panel - TSH  4. Prediabetes  - Hemoglobin A1c - Insulin, random  5. Vitamin D deficiency  - VITAMIN D 25 Hydroxy   6. CHB (complete heart block) (HCC)   7. Gastroesophageal reflux disease   8. Screening for rectal cancer   9.  Screening for ischemic heart disease   10. Medication management  - Urinalysis, Routine w reflex microscopic - CBC with Differential/Platelet - BASIC METABOLIC PANEL WITH GFR - Hepatic function panel - Lipid panel        Continue prudent diet as discussed, weight control, BP monitoring, regular exercise, and medications. Discussed med's effects and SE's. Screening labs and tests as requested with regular follow-up as recommended. Over 40 minutes of exam, counseling, chart review and high complex critical decision making was performed.

## 2016-09-28 LAB — URINALYSIS, ROUTINE W REFLEX MICROSCOPIC
BILIRUBIN URINE: NEGATIVE
Glucose, UA: NEGATIVE
Hgb urine dipstick: NEGATIVE
Ketones, ur: NEGATIVE
Leukocytes, UA: NEGATIVE
Nitrite: NEGATIVE
PH: 7 (ref 5.0–8.0)
Protein, ur: NEGATIVE
SPECIFIC GRAVITY, URINE: 1.004 (ref 1.001–1.035)

## 2016-09-28 LAB — MICROALBUMIN / CREATININE URINE RATIO
CREATININE, URINE: 18 mg/dL — AB (ref 20–320)
MICROALB UR: 0.2 mg/dL
Microalb Creat Ratio: 11 mcg/mg creat (ref <30)

## 2016-09-28 LAB — VITAMIN D 25 HYDROXY (VIT D DEFICIENCY, FRACTURES): Vit D, 25-Hydroxy: 75 ng/mL (ref 30–100)

## 2016-09-28 LAB — INSULIN, RANDOM: Insulin: 5.1 u[IU]/mL (ref 2.0–19.6)

## 2016-10-06 ENCOUNTER — Encounter: Payer: Self-pay | Admitting: Internal Medicine

## 2016-10-06 ENCOUNTER — Ambulatory Visit (INDEPENDENT_AMBULATORY_CARE_PROVIDER_SITE_OTHER): Payer: Medicare PPO | Admitting: Internal Medicine

## 2016-10-06 VITALS — BP 118/74 | HR 89 | Temp 97.7°F | Resp 16 | Ht 63.75 in | Wt 156.0 lb

## 2016-10-06 DIAGNOSIS — N952 Postmenopausal atrophic vaginitis: Secondary | ICD-10-CM

## 2016-10-06 DIAGNOSIS — Z6826 Body mass index (BMI) 26.0-26.9, adult: Secondary | ICD-10-CM | POA: Diagnosis not present

## 2016-10-06 DIAGNOSIS — Z79899 Other long term (current) drug therapy: Secondary | ICD-10-CM

## 2016-10-06 DIAGNOSIS — D72820 Lymphocytosis (symptomatic): Secondary | ICD-10-CM | POA: Diagnosis not present

## 2016-10-06 DIAGNOSIS — I447 Left bundle-branch block, unspecified: Secondary | ICD-10-CM | POA: Diagnosis not present

## 2016-10-06 DIAGNOSIS — Z0000 Encounter for general adult medical examination without abnormal findings: Secondary | ICD-10-CM

## 2016-10-06 DIAGNOSIS — M810 Age-related osteoporosis without current pathological fracture: Secondary | ICD-10-CM

## 2016-10-06 DIAGNOSIS — Z0001 Encounter for general adult medical examination with abnormal findings: Secondary | ICD-10-CM | POA: Diagnosis not present

## 2016-10-06 DIAGNOSIS — D72819 Decreased white blood cell count, unspecified: Secondary | ICD-10-CM | POA: Diagnosis not present

## 2016-10-06 DIAGNOSIS — R6889 Other general symptoms and signs: Secondary | ICD-10-CM

## 2016-10-06 DIAGNOSIS — E782 Mixed hyperlipidemia: Secondary | ICD-10-CM

## 2016-10-06 DIAGNOSIS — N3281 Overactive bladder: Secondary | ICD-10-CM | POA: Diagnosis not present

## 2016-10-06 DIAGNOSIS — I10 Essential (primary) hypertension: Secondary | ICD-10-CM | POA: Diagnosis not present

## 2016-10-06 DIAGNOSIS — R7303 Prediabetes: Secondary | ICD-10-CM

## 2016-10-06 DIAGNOSIS — I421 Obstructive hypertrophic cardiomyopathy: Secondary | ICD-10-CM | POA: Diagnosis not present

## 2016-10-06 DIAGNOSIS — E559 Vitamin D deficiency, unspecified: Secondary | ICD-10-CM

## 2016-10-06 DIAGNOSIS — I442 Atrioventricular block, complete: Secondary | ICD-10-CM

## 2016-10-06 NOTE — Progress Notes (Signed)
MEDICARE ANNUAL WELLNESS VISIT AND FOLLOW UP  Assessment:   1. CHB (complete heart block) (HCC) -followed by cards  2. Essential hypertension -cont meds -dash diet -monitor at home -exercise as tolerated  3. Hypertrophic obstructive cardiomyopathy (HCC) -followed by cards  4. LBBB (left bundle branch block) -followed by cards  5. Age-related osteoporosis without current pathological fracture -due for dexa -cont calcium and Vitamin D  6. Atrophic vaginitis -vaginal lubricant  7. DI (detrusor instability) Currently seeing urology/obgyn  8. BMI 26+,  adult -BMI well controlled  9. Mixed hyperlipidemia -cont diet and exercise -cont meds   10. Leukopenia, unspecified type -cont CBC monitoring  11. Lymphocytosis -cont CBC monitoring  12. Medication management -cont Biyearly monitoring  13. Prediabetes -cont diet and exercise -A1c level biyearly  14. Vitamin D deficiency -cont Vit D    Over 30 minutes of exam, counseling, chart review, and critical decision making was performed  Future Appointments Date Time Provider Department Center  10/06/2016 11:15 AM Terri Piedraourtney Forcucci, PA-C [facility] None  12/30/2016 8:45 AM Terri Piedraourtney Forcucci, PA-C [facility] None  04/11/2017 9:30 AM Lucky CowboyWilliam [name], MD [facility] None  10/23/2017 9:00 AM Lucky CowboyWilliam [name], MD [facility] None    Plan:   During the course of the visit the patient was educated and counseled about appropriate screening and preventive services including:    Pneumococcal vaccine   Influenza vaccine  Td vaccine  Prevnar 13  Screening electrocardiogram  Screening mammography  Bone densitometry screening  Colorectal cancer screening  Diabetes screening  Glaucoma screening  Nutrition counseling   Advanced directives: given info/requested copies   Subjective:   Alexandra Wheeler is a 75 y.o. female who presents for Medicare Annual Wellness Visit and 3 month follow up on hypertension,  prediabetes, hyperlipidemia, vitamin D def.   Her blood pressure has been controlled at home, today their BP is   She does not workout. She denies chest pain, shortness of breath, dizziness.  She is not on cholesterol medication and denies myalgias. Her cholesterol is at goal. The cholesterol last visit was:  Lab Results  Component Value Date   CHOL 170 09/27/2016   HDL 62 09/27/2016   LDLCALC 91 09/27/2016   TRIG 84 09/27/2016   CHOLHDL 2.7 09/27/2016   A1c levels are historically well controlled currently.  Have been mildly high in the past.  Last A1C in the office was: Lab Results  Component Value Date   HGBA1C 5.5 09/27/2016   Last GFR Lab Results  Component Value Date   GFRNONAA 64 09/27/2016   Lab Results  Component Value Date   GFRAA 74 09/27/2016   Patient is on Vitamin D supplement. Lab Results  Component Value Date   VD25OH 75 09/27/2016      Medication Review Current Outpatient Prescriptions on File Prior to Visit  Medication Sig Dispense Refill  . ALPRAZolam (XANAX) 1 MG tablet TAKE 1/2 TO 1 TABLET THREE TIMES DAILY AS NEEDED FOR ANXIETY 270 tablet 0  . Ascorbic Acid (VITAMIN C PO) Take 1 tablet by mouth daily.     Marland Kitchen. aspirin EC 81 MG tablet Take 81 mg by mouth daily.    Marland Kitchen. BIOTIN PO Take 1 tablet by mouth daily.     . Calcium Carbonate-Vitamin D (CALCIUM + D PO) Take 1 tablet by mouth daily.     . Cholecalciferol (VITAMIN D PO) Take 5,000 Units by mouth daily.     . citalopram (CELEXA) 40 MG tablet TAKE 1 TABLET (40 MG TOTAL)  BY MOUTH DAILY. 90 tablet 1  . Coenzyme Q10 (CO Q 10 PO) Take 1 capsule by mouth daily.    Marland Kitchen FOLIC ACID PO Take 1 tablet by mouth daily.     Marland Kitchen gabapentin (NEURONTIN) 600 MG tablet Take 1/2 to 1 tablet 3 to 4 x day as needed for neuralgia pain 120 tablet 5  . hydrochlorothiazide (HYDRODIURIL) 25 MG tablet Take 25 mg by mouth daily as needed (swelling).     Marland Kitchen lisinopril (PRINIVIL,ZESTRIL) 20 MG tablet Take 1 tablet daily for BP 90 tablet 1   . MAGNESIUM PO Take 500 mg by mouth daily.     . metoprolol succinate (TOPROL-XL) 50 MG 24 hr tablet Take 1 tablet daily for BP 90 tablet 1  . Multiple Vitamin (MULTIVITAMIN) tablet Take 1 tablet by mouth daily.    Marland Kitchen PROCTOZONE-HC 2.5 % rectal cream PLACE 1 APPLICATION RECTALLY 2 (TWO) TIMES DAILY. 28.35 g 5  . ranitidine (ZANTAC) 300 MG tablet Take 300 mg by mouth daily as needed for heartburn.    . Tretinoin, Facial Wrinkles, (TRETINOIN, EMOLLIENT,) 0.05 % CREA APPLY TO AFFECTED AREA DAILY AS DIRECTED 40 g 2   No current facility-administered medications on file prior to visit.     Allergies: Allergies  Allergen Reactions  . Actonel [Risedronate Sodium]     Pt didn't like the way it made her feel   . Boniva [Ibandronic Acid]     Pt didn't like the way it made her feel  . Evista [Raloxifene]     Pt didn't like the way it made her feel  . Fosamax [Alendronate Sodium]     Pt didn't like the way it made her feel   . Levsin [Hyoscyamine Sulfate]     unknown  . Mobic [Meloxicam]     unknown   . Topamax [Topiramate]     unknown   . Zoloft [Sertraline Hcl]     unknown     Current Problems (verified) has Osteoporosis; DI (detrusor instability); Atrophic vaginitis; LBBB (left bundle branch block); CHB (complete heart block) (HCC); Vitamin D deficiency; Prediabetes; HTN (hypertension); Hyperlipidemia; Leukopenia; Lymphocytosis; Medication management; BMI 26+,  adult; and Hypertrophic obstructive cardiomyopathy (HCC) on her problem list.  Screening Tests Immunization History  Administered Date(s) Administered  . Influenza Split 08/06/2014  . Influenza, High Dose Seasonal PF 09/01/2015  . Pneumococcal Conjugate-13 09/01/2015  . Pneumococcal-Unspecified 10/08/2010  . Td 05/09/2011  . Zoster 05/09/2011    Preventative care: Last colonoscopy: 2015 Last mammogram: 2016  Last pap smear/pelvic exam: remote DEXA: 2004, Due Names of Other Physician/Practitioners you currently  use: 1. [facility]- here for primary care 2. [facility], eye doctor, last visit 2017 3. Does not see currently, dentist, last visit 2015 Patient Care Team: Lucky Cowboy, MD as PCP - General (Internal Medicine) Lucky Cowboy, MD as Attending Physician (Internal Medicine) Hillis Range, MD as Consulting Physician (Cardiology) Artis Delay, MD as Consulting Physician (Hematology and Oncology) Bernette Redbird, MD as Consulting Physician (Gastroenterology)  Surgical: She  has a past surgical history that includes Breast surgery; Tubal ligation; Hemorrhoid surgery; Pacemaker insertion (06-11-2013); left heart catheterization with coronary angiogram (N/A, 06/11/2013); temporary pacemaker insertion (N/A, 06/11/2013); permanent pacemaker insertion (Left, 06/11/2013); and permanent pacemaker insertion (N/A, 06/11/2013). Family Her family history includes Aneurysm in her brother; Breast cancer (age of onset: 53) in her sister; Cancer in her father; Diabetes in her brother, brother, and mother; Heart disease in her brother, brother, and mother;  Hypertension in her brother; Stroke in her brother. Social history  She reports that she quit smoking about 41 years ago. Her smoking use included Cigarettes. She has never used smokeless tobacco. She reports that she does not drink alcohol or use drugs.  MEDICARE WELLNESS OBJECTIVES: Physical activity:   Cardiac risk factors:   Depression/mood screen:   Depression screen [facility] 09/27/2016  Decreased Interest 0  Down, Depressed, Hopeless 0  PHQ - 2 Score 0    ADLs:  In your present state of health, do you have any difficulty performing the following activities: 09/27/2016 03/02/2016  Hearing? N N  Vision? N -  Difficulty concentrating or making decisions? N N  Walking or climbing stairs? N N  Dressing or bathing? N N  Doing errands, shopping? N N  Some recent data might be hidden     Cognitive Testing  Alert? Yes   Normal Appearance?Yes  Oriented to person? Yes  Place? Yes   Time? Yes  Recall of three objects?  Yes  Can perform simple calculations? Yes  Displays appropriate judgment?Yes  Can read the correct time from a watch face?Yes  EOL planning:     Objective:   Vitals:   10/06/16 1131  BP: 118/74  Pulse: 89  Resp: 16  Temp: 97.7 F (36.5 C)   Body mass index is 26.99 kg/m.    General appearance: alert, no distress, WD/WN,  female HEENT: normocephalic, sclerae anicteric, TMs pearly, nares patent, no discharge or erythema, pharynx normal Oral cavity: MMM, no lesions Neck: supple, no lymphadenopathy, no thyromegaly, no masses Heart: RRR, normal S1, S2, no murmurs Lungs: CTA bilaterally, no wheezes, rhonchi, or rales Abdomen: +bs, soft, non tender, non distended, no masses, no hepatomegaly, no splenomegaly Musculoskeletal: nontender, no swelling, no obvious deformity Extremities: no edema, no cyanosis, no clubbing Pulses: 2+ symmetric, upper and lower extremities, normal cap refill Neurological: alert, oriented x 3, CN2-12 intact, strength normal upper extremities and lower extremities, sensation normal throughout, DTRs 2+ throughout, no cerebellar signs, gait normal Psychiatric: normal affect, behavior normal, pleasant

## 2016-10-28 ENCOUNTER — Telehealth: Payer: Self-pay | Admitting: *Deleted

## 2016-10-28 MED ORDER — AZITHROMYCIN 250 MG PO TABS: ORAL_TABLET | ORAL | 0 refills | Status: AC

## 2016-10-28 NOTE — Telephone Encounter (Signed)
Patient called and complained of sinus drainage with a sore throat and headache.  Per Dr Oneta RackMcKeown, send in an RX for a Z-pak.  Patient is aware.

## 2016-11-09 ENCOUNTER — Other Ambulatory Visit: Payer: Self-pay | Admitting: *Deleted

## 2016-11-09 DIAGNOSIS — Z1212 Encounter for screening for malignant neoplasm of rectum: Secondary | ICD-10-CM

## 2016-11-09 LAB — POC HEMOCCULT BLD/STL (HOME/3-CARD/SCREEN)
Card #2 Fecal Occult Blod, POC: NEGATIVE
FECAL OCCULT BLD: NEGATIVE
Fecal Occult Blood, POC: NEGATIVE

## 2016-12-03 IMAGING — CT CT L SPINE W/ CM
1 of 6 series · 3 of 14 positions shown, 4 images · non-contrast
Comparison: None

CLINICAL DATA: Low back pain.  BILATERAL hip pain.
TECHNIQUE: Contiguous axial images were obtained through the Lumbar spine after
the intrathecal infusion of infusion. Coronal and sagittal
reconstructions were obtained of the axial image sets.

[Series 3: l spine soft · axial · 0.27mm/px · z∈[-168,-66]mm · 3 of 69 slices shown, 4 images]
[im 18/69  soft-tissue]
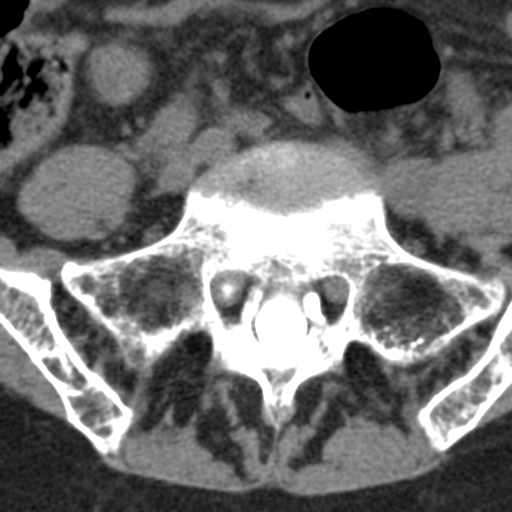
[im 18/69  bone]
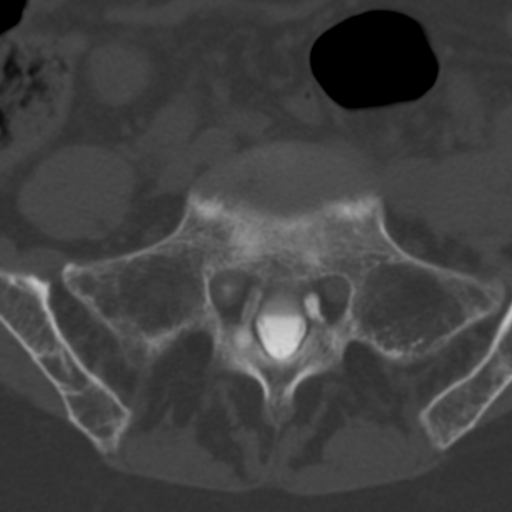
[im 35/69  bone]
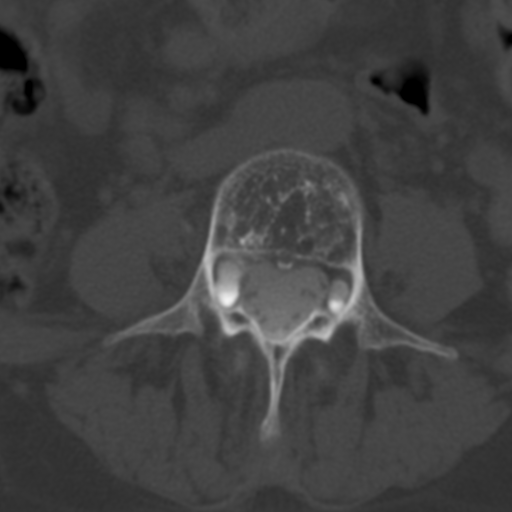
[im 52/69  bone]
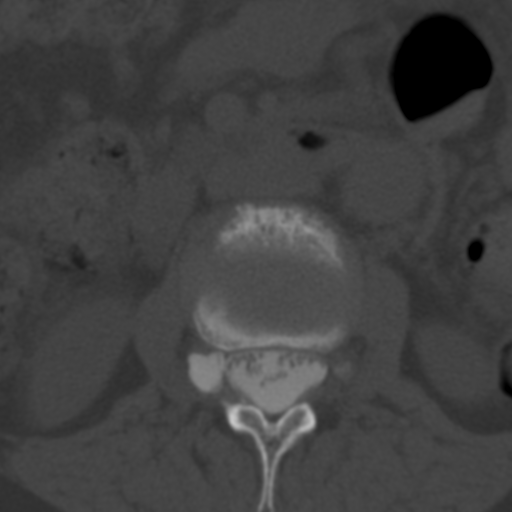

[3 of 14 positions shown; findings below may reference images not displayed]

EXAM:
LUMBAR MYELOGRAM

FLUOROSCOPY TIME:  51 seconds corresponding to a Dose Area Product
of 287.4 ?Gy*m2

PROCEDURE:
After thorough discussion of risks and benefits of the procedure
including bleeding, infection, injury to nerves, blood vessels,
adjacent structures as well as headache and CSF leak, written and
oral informed consent was obtained. Consent was obtained by Dr. Pawek
Fu. Time out form was completed.

Patient was positioned prone on the fluoroscopy table. Local
anesthesia was provided with 1% lidocaine without epinephrine after
prepped and draped in the usual sterile fashion. Puncture was
performed at L3-L4 using a 3 1/2 inch 22-gauge spinal needle via
midline approach. Using a single pass through the dura, the needle
was placed within the thecal sac, with return of clear CSF. 15 mL of
Isovue-M 200 was injected into the thecal sac, with normal
opacification of the nerve roots and cauda equina consistent with
free flow within the subarachnoid space.

I personally performed the lumbar puncture and administered the
intrathecal contrast. I also personally supervised acquisition of
the myelogram images.
FINDINGS: LUMBAR MYELOGRAM FINDINGS: Good opacification lumbar subarachnoid
space. No nerve root cut off or spinal stenosis. Incidental Tarlov
cysts. Disc space narrowing L5-S1. Standing flexion extension views
demonstrate maintain normal alignment without dynamic instability.

CT LUMBAR MYELOGRAM FINDINGS:

Segmentation: Hypertrophied transverse processes at L5 result in
transitional anatomy with partial sacralization at hypoplasia of the
L5-S1 interspace.

Alignment:  Normal.

Vertebrae: No worrisome osseous lesion.Advanced osteopenia.

Conus medullaris: Normal in size, signal, and location.

Paraspinal tissues: No evidence for hydronephrosis or paravertebral
mass.

Disc levels:

L1-L2:  Central and rightward protrusion.  No impingement.

L2-L3:  Mild bulge.  No impingement.

L3-L4:  Mild bulge.  No impingement.

L4-L5:  Mild bulge.  Mild facet arthropathy.  No impingement.

L5-S1:  Transitional anatomy.  No impingement.
IMPRESSION: LUMBAR MYELOGRAM IMPRESSION:

Anatomic alignment. No evidence for spinal stenosis, nerve root cut
off, or dynamic instability.

CT LUMBAR MYELOGRAM IMPRESSION:

Transitional L5-S1 level. No disc protrusion or spinal stenosis. No
compressive lesion is identified.

## 2016-12-27 NOTE — Progress Notes (Signed)
Electrophysiology Office Note Date: 12/29/2016  ID:  Siah, Kannan 11/19/1940, MRN [id number]  PCP: Nadean Corwin, MD Primary Cardiologist: Shirlee Latch Electrophysiologist: Allred  CC: Pacemaker follow-up  AOKI WEDEMEYER is a 76 y.o. female seen today for Dr Johney Frame.  She presents today for routine electrophysiology followup.  Since last being seen in our clinic, the patient reports doing very well. She has occasional palpitations.  She denies chest pain, dyspnea, PND, orthopnea, nausea, vomiting, dizziness, syncope, edema, weight gain, or early satiety.  Device History: STJ dual chamber PPM implanted 2014 for complete heart block    Past Medical History:  Diagnosis Date  . Anxiety   . Atrophic vaginitis   . Chest pain    a. Adenosine sestamibi (1/14) with no evidence of ischemia or infarction, EF 63%.   . Colon polyps   . Complete heart block (HCC) 06-2013   status post pacemaker implantation by Dr Johney Frame 06-11-2013  . Depression   . DI (detrusor instability)   . GERD (gastroesophageal reflux disease)    History + Hyplori via EGD  . H/O echocardiogram    a. Echo 11/2012:EF 50-55%, focal basal septal hypertrophy with some mitral valve SAM (mild) and MR.  This may be a sigmoid septum with advanced age or could be a variant of hypertrophic cardiomyopathy.  Marland Kitchen HTN (hypertension)   . Hyperlipidemia   . LBBB (left bundle branch block)   . Leukopenia 05/19/2014  . Lymphocytosis 05/19/2014  . Migraines   . Osteoporosis   . Prediabetes   . Vitamin D deficiency    Past Surgical History:  Procedure Laterality Date  . BREAST SURGERY     Breast cyst  . HEMORRHOID SURGERY    . LEFT HEART CATHETERIZATION WITH CORONARY ANGIOGRAM N/A 06/11/2013   Procedure: LEFT HEART CATHETERIZATION WITH CORONARY ANGIOGRAM;  Surgeon: Kathleene Hazel, MD;  Location: [facility] CATH LAB;  Service: Cardiovascular;  Laterality: N/A;  . PACEMAKER INSERTION  06-11-2013   STJ dual chamber pacemaker  implanted by Dr Johney Frame for complete heart block  . PERMANENT PACEMAKER INSERTION Left 06/11/2013   Procedure: PERMANENT PACEMAKER INSERTION;  Surgeon: Kathleene Hazel, MD;  Location: [facility] CATH LAB;  Service: Cardiovascular;  Laterality: Left;  . PERMANENT PACEMAKER INSERTION N/A 06/11/2013   Procedure: PERMANENT PACEMAKER INSERTION;  Surgeon: Hillis Range, MD;  Location[facility] CATH LAB;  Service: Cardiovascular;  Laterality: N/A;  . TEMPORARY PACEMAKER INSERTION N/A 06/11/2013   Procedure: TEMPORARY PACEMAKER INSERTION;  Surgeon: Kathleene Hazel, MD;  Location: [facility] CATH LAB;  Service: Cardiovascular;  Laterality: N/A;  . TUBAL LIGATION      Current Outpatient Prescriptions  Medication Sig Dispense Refill  . ALPRAZolam (XANAX) 1 MG tablet TAKE 1/2 TO 1 TABLET THREE TIMES DAILY AS NEEDED FOR ANXIETY 270 tablet 0  . Ascorbic Acid (VITAMIN C PO) Take 1 tablet by mouth daily.     Marland Kitchen aspirin EC 81 MG tablet Take 81 mg by mouth daily.    Marland Kitchen BIOTIN PO Take 1 tablet by mouth daily.     . Calcium Carbonate-Vitamin D (CALCIUM + D PO) Take 1 tablet by mouth daily.     . Cholecalciferol (VITAMIN D PO) Take 5,000 Units by mouth daily.     . citalopram (CELEXA) 40 MG tablet TAKE 1 TABLET (40 MG TOTAL) BY MOUTH DAILY. 90 tablet 1  . Coenzyme Q10 (CO Q 10 PO) Take 1 capsule by mouth daily.    Marland Kitchen FOLIC ACID PO Take 1  tablet by mouth daily.     Marland Kitchen. gabapentin (NEURONTIN) 600 MG tablet Take 1/2 to 1 tablet 3 to 4 x day as needed for neuralgia pain 120 tablet 5  . hydrochlorothiazide (HYDRODIURIL) 25 MG tablet Take 25 mg by mouth daily as needed (swelling).     Marland Kitchen. lisinopril (PRINIVIL,ZESTRIL) 20 MG tablet Take 1 tablet daily for BP 90 tablet 1  . MAGNESIUM PO Take 500 mg by mouth daily.     . metoprolol succinate (TOPROL-XL) 50 MG 24 hr tablet Take 1 tablet daily for BP 90 tablet 1  . Multiple Vitamin (MULTIVITAMIN) tablet Take 1 tablet by mouth daily.    Marland Kitchen. PROCTOZONE-HC 2.5 % rectal cream PLACE 1 APPLICATION  RECTALLY 2 (TWO) TIMES DAILY. 28.35 g 5  . ranitidine (ZANTAC) 300 MG tablet Take 300 mg by mouth daily as needed for heartburn.    . Tretinoin, Facial Wrinkles, (TRETINOIN, EMOLLIENT,) 0.05 % CREA APPLY TO AFFECTED AREA DAILY AS DIRECTED 40 g 2   No current facility-administered medications for this visit.     Allergies:   Actonel [risedronate sodium]; Boniva [ibandronic acid]; Evista [raloxifene]; Fosamax [alendronate sodium]; Levsin [hyoscyamine sulfate]; Mobic [meloxicam]; Topamax [topiramate]; and Zoloft [sertraline hcl]   Social History: Social History   Social History  . Marital status: Widowed    Spouse name: N/A  . Number of children: N/A  . Years of education: N/A   Occupational History  . Part-time at [employer]    Social History Main Topics  . Smoking status: Former Smoker    Types: Cigarettes    Quit date: 10/09/1975  . Smokeless tobacco: Never Used  . Alcohol use No  . Drug use: No  . Sexual activity: No   Other Topics Concern  . Not on file   Social History Narrative   Lives alone. Dtr in the area.    Family History: Family History  Problem Relation Age of Onset  . Diabetes Mother   . Heart disease Mother   . Diabetes Brother   . Hypertension Brother   . Heart disease Brother   . Breast cancer Sister [id number]  . Aneurysm Brother   . Stroke Brother   . Cancer Father     prostate  . Diabetes Brother   . Heart disease Brother      Review of Systems: All other systems reviewed and are otherwise negative except as noted above.   Physical Exam: VS:  There were no vitals taken for this visit. , BMI There is no height or weight on file to calculate BMI.  GEN- The patient is well appearing, alert and oriented x 3 today.   HEENT: normocephalic, atraumatic; sclera clear, conjunctiva pink; hearing intact; oropharynx clear; neck supple Lungs- Clear to ausculation bilaterally, normal work of breathing.  No wheezes, rales, rhonchi Heart- Regular rate and rhythm  (paced)  GI- soft, non-tender, non-distended, bowel sounds present Extremities- no clubbing, cyanosis, or edema; DP/PT/radial pulses 2+ bilaterally MS- no significant deformity or atrophy Skin- warm and dry, no rash or lesion; PPM pocket well healed Psych- euthymic mood, full affect Neuro- strength and sensation are intact  PPM Interrogation- reviewed in detail today,  See PACEART report  EKG:  EKG is not ordered today.  Recent Labs: 09/27/2016: ALT 14; BUN 10; Creat 0.88; Hemoglobin 13.2; Magnesium 2.2; Platelets 222; Potassium 4.0; Sodium 141; TSH 1.05   Wt Readings from Last 3 Encounters:  10/06/16 156 lb (70.8 kg)  09/27/16 152 lb 6.4 oz (69.1 kg)  03/02/16 148 lb 3.2 oz (67.2 kg)     Other studies Reviewed: Additional studies/ records that were reviewed today include: Dr Jenel Lucks office notes  Assessment and Plan:  1.  Complete heart block  Normal PPM function See Pace Art report No changes today  2.  Hypertension Stable No change required today  3.  Hypertrophic cardiomyopathy Continue BB Follow up with Dr Shirlee Latch (overdue) She did have one episode of NSVT on device interrogation today in November  4.  Paroxysmal atrial fibrillation Longest episode <10 hours Will follow for now We discussed need for [facility] if burden increases Continue to follow remotely    Current medicines are reviewed at length with the patient today.   The patient does not have concerns regarding her medicines.  The following changes were made today:  none  Labs/ tests ordered today include: none Orders Placed This Encounter  Procedures  . CUP PACEART INCLINIC DEVICE CHECK     Disposition:   Follow up with Arsenio Katz, Dr Shirlee Latch 2 months, Dr Johney Frame 1 year    Signed, Gypsy Balsam, NP 12/29/2016 9:06 AM  [facility] [address] [place] [address] [phone] (office) [phone] (fax)

## 2016-12-29 ENCOUNTER — Ambulatory Visit (INDEPENDENT_AMBULATORY_CARE_PROVIDER_SITE_OTHER): Payer: Medicare PPO | Admitting: Internal Medicine

## 2016-12-29 ENCOUNTER — Ambulatory Visit: Payer: Self-pay | Admitting: Internal Medicine

## 2016-12-29 ENCOUNTER — Encounter (INDEPENDENT_AMBULATORY_CARE_PROVIDER_SITE_OTHER): Payer: Self-pay

## 2016-12-29 ENCOUNTER — Ambulatory Visit (INDEPENDENT_AMBULATORY_CARE_PROVIDER_SITE_OTHER): Payer: Medicare PPO | Admitting: Nurse Practitioner

## 2016-12-29 ENCOUNTER — Encounter: Payer: Self-pay | Admitting: Internal Medicine

## 2016-12-29 VITALS — BP 120/60 | HR 87 | Temp 98.6°F | Resp 14 | Ht 63.75 in | Wt 159.0 lb

## 2016-12-29 DIAGNOSIS — I480 Paroxysmal atrial fibrillation: Secondary | ICD-10-CM | POA: Diagnosis not present

## 2016-12-29 DIAGNOSIS — I442 Atrioventricular block, complete: Secondary | ICD-10-CM

## 2016-12-29 DIAGNOSIS — J069 Acute upper respiratory infection, unspecified: Secondary | ICD-10-CM

## 2016-12-29 DIAGNOSIS — I10 Essential (primary) hypertension: Secondary | ICD-10-CM | POA: Diagnosis not present

## 2016-12-29 DIAGNOSIS — I421 Obstructive hypertrophic cardiomyopathy: Secondary | ICD-10-CM | POA: Diagnosis not present

## 2016-12-29 LAB — CUP PACEART INCLINIC DEVICE CHECK
Date Time Interrogation Session: 20180222083805
Implantable Lead Implant Date: 20140805
Implantable Lead Location: 753860
Implantable Lead Model: 1944
Implantable Lead Model: 1948
MDC IDC LEAD IMPLANT DT: 20140805
MDC IDC LEAD LOCATION: 753859
MDC IDC PG IMPLANT DT: 20140805
MDC IDC PG SERIAL: 7523768

## 2016-12-29 MED ORDER — AZITHROMYCIN 250 MG PO TABS: ORAL_TABLET | ORAL | 0 refills | Status: DC

## 2016-12-29 MED ORDER — FLUTICASONE PROPIONATE 50 MCG/ACT NA SUSP: 2.0000 | Freq: Every day | NASAL | 0 refills | Status: DC

## 2016-12-29 MED ORDER — BENZONATATE 100 MG PO CAPS: 100.0000 mg | ORAL_CAPSULE | Freq: Four times a day (QID) | ORAL | 1 refills | Status: DC | PRN

## 2016-12-29 MED ORDER — DEXAMETHASONE SODIUM PHOSPHATE 10 MG/ML IJ SOLN
10.0000 mg | Freq: Once | INTRAMUSCULAR | Status: AC
  Administered 2016-12-31: 10 mg via INTRAMUSCULAR

## 2016-12-29 NOTE — Progress Notes (Signed)
HPI  Patient presents to the office for evaluation of cough.  It has been going on for 1 months.  Patient reports night > day, wet, barky, worse with lying down.  They also endorse change in voice, postnasal drip and nasal cngestion, ear pain, mild cough, sore throat, headache, sinus pressure, rhinorrhea.  .  They have tried none.  They report that nothing has worked.  They admits to other sick contacts.  She has family members with similar stuff.  Review of Systems  Constitutional: Positive for malaise/fatigue. Negative for chills and fever.  HENT: Positive for congestion, ear pain, hearing loss and sore throat.   Respiratory: Positive for cough. Negative for sputum production, shortness of breath and wheezing.   Cardiovascular: Negative for chest pain, palpitations and leg swelling.  Neurological: Positive for headaches.    PE:  General:  Alert and non-toxic, WDWN, NAD HEENT: NCAT, PERLA, EOM normal, no occular discharge or erythema.  Nasal mucosal edema with sinus tenderness to palpation.  Oropharynx clear with minimal oropharyngeal edema and erythema.  Mucous membranes moist and pink. Neck:  Cervical adenopathy Chest:  RRR no MRGs.  Lungs clear to auscultation A&P with no wheezes rhonchi or rales.   Abdomen: +BS x 4 quadrants, soft, non-tender, no guarding, rigidity, or rebound. Skin: warm and dry no rash Neuro: A&Ox4, CN II-XII grossly intact  Assessment and Plan:   1. Acute URI -cont daily antihistamine -nasal saline -benadryl at bedtime if desired - benzonatate (TESSALON PERLES) 100 MG capsule; Take 1 capsule (100 mg total) by mouth every 6 (six) hours as needed for cough.  Dispense: 30 capsule; Refill: 1 - fluticasone (FLONASE) 50 MCG/ACT nasal spray; Place 2 sprays into both nostrils daily.  Dispense: 16 g; Refill: 0 - dexamethasone (DECADRON) injection 10 mg; Inject 1 mL (10 mg total) into the muscle once. - azithromycin (ZITHROMAX Z-PAK) 250 MG tablet; 2 po day one, then 1  daily x 4 days  Dispense: 6 tablet; Refill: 0

## 2016-12-29 NOTE — Patient Instructions (Addendum)
Medication Instructions:    Your physician recommends that you continue on your current medications as directed. Please refer to the Current Medication list given to you today.   If you need a refill on your cardiac medications before your next appointment, please call your pharmacy.  Labwork: NONE ORDERED  TODAY    Testing/Procedures: NONE ORDERED  TODAY    Follow-Up:MCLEAN IN 2 MONTHS   Your physician wants you to follow-up in: ONE YEAR WITH ALLRED  You will receive a reminder letter in the mail two months in advance. If you don't receive a letter, please call our office to schedule the follow-up appointment.    Any Other Special Instructions Will Be Listed Below (If Applicable).

## 2016-12-30 ENCOUNTER — Ambulatory Visit: Payer: Self-pay | Admitting: Internal Medicine

## 2016-12-31 DIAGNOSIS — J069 Acute upper respiratory infection, unspecified: Secondary | ICD-10-CM | POA: Diagnosis not present

## 2017-01-11 ENCOUNTER — Ambulatory Visit: Payer: Self-pay | Admitting: Internal Medicine

## 2017-01-24 ENCOUNTER — Other Ambulatory Visit: Payer: Self-pay | Admitting: Internal Medicine

## 2017-01-24 DIAGNOSIS — I10 Essential (primary) hypertension: Secondary | ICD-10-CM

## 2017-02-14 ENCOUNTER — Other Ambulatory Visit: Payer: Self-pay | Admitting: Internal Medicine

## 2017-02-14 DIAGNOSIS — I10 Essential (primary) hypertension: Secondary | ICD-10-CM

## 2017-02-28 ENCOUNTER — Ambulatory Visit (HOSPITAL_COMMUNITY)
Admission: RE | Admit: 2017-02-28 | Discharge: 2017-02-28 | Disposition: A | Discharge status: Home / Self Care | Payer: Medicare PPO | Source: Ambulatory Visit | Attending: Cardiology | Admitting: Cardiology

## 2017-02-28 ENCOUNTER — Encounter (HOSPITAL_COMMUNITY): Payer: Self-pay

## 2017-02-28 VITALS — BP 124/69 | HR 62 | Wt 160.8 lb

## 2017-02-28 DIAGNOSIS — Z950 Presence of cardiac pacemaker: Secondary | ICD-10-CM | POA: Insufficient documentation

## 2017-02-28 DIAGNOSIS — I4891 Unspecified atrial fibrillation: Secondary | ICD-10-CM | POA: Insufficient documentation

## 2017-02-28 DIAGNOSIS — I10 Essential (primary) hypertension: Secondary | ICD-10-CM | POA: Insufficient documentation

## 2017-02-28 DIAGNOSIS — I480 Paroxysmal atrial fibrillation: Secondary | ICD-10-CM

## 2017-02-28 DIAGNOSIS — K219 Gastro-esophageal reflux disease without esophagitis: Secondary | ICD-10-CM | POA: Diagnosis not present

## 2017-02-28 DIAGNOSIS — I421 Obstructive hypertrophic cardiomyopathy: Secondary | ICD-10-CM

## 2017-02-28 DIAGNOSIS — Z87891 Personal history of nicotine dependence: Secondary | ICD-10-CM | POA: Insufficient documentation

## 2017-02-28 DIAGNOSIS — E785 Hyperlipidemia, unspecified: Secondary | ICD-10-CM | POA: Insufficient documentation

## 2017-02-28 DIAGNOSIS — F329 Major depressive disorder, single episode, unspecified: Secondary | ICD-10-CM | POA: Diagnosis not present

## 2017-02-28 DIAGNOSIS — I442 Atrioventricular block, complete: Secondary | ICD-10-CM | POA: Insufficient documentation

## 2017-02-28 DIAGNOSIS — Z7982 Long term (current) use of aspirin: Secondary | ICD-10-CM | POA: Insufficient documentation

## 2017-02-28 DIAGNOSIS — Z7951 Long term (current) use of inhaled steroids: Secondary | ICD-10-CM | POA: Diagnosis not present

## 2017-02-28 DIAGNOSIS — Z8249 Family history of ischemic heart disease and other diseases of the circulatory system: Secondary | ICD-10-CM | POA: Diagnosis not present

## 2017-02-28 DIAGNOSIS — I422 Other hypertrophic cardiomyopathy: Secondary | ICD-10-CM | POA: Diagnosis not present

## 2017-02-28 DIAGNOSIS — Z79899 Other long term (current) drug therapy: Secondary | ICD-10-CM | POA: Diagnosis not present

## 2017-02-28 NOTE — Progress Notes (Signed)
Patient ID: Alexandra Wheeler, female   DOB: 30-Dec-1940, 76 y.o.   MRN: [id number] PCP: Dr. [name] Cardiology: Dr. Shirlee Wheeler  76 yo with history of chronic LBBB, possible hypertrophic cardiomyopathy, and CHB with St Jude PPM presents for cardiology followup.  She has an echocardiogram that looks like hypertrophic cardiomyopathy with asymmetric septal hypertrophy and mitral valve SAM with mild MR.  No resting LVOT gradient has been recorded.  She developed CHB in 2012 and had St Jude PPM.  She had cardiac cath at that time with no significant CAD.    She has rare palpitations.  At last EP visit, she was noted to have had some atrial fibrillation but a very low burden.  Anticoagulation was not recommended at the time.  Today, she seems to be doing fairly well.  She is working some at [employer].  No exertional dyspnea or chest pain.  No lightheadedness/syncope.  No BRBPR/melena.   ECG (personally reviewed): A-V sequential pacing  Pacemaker was interrogated today: No atrial fibrillation since last check in 2/18.   Labs (12/13): K 4.1, creatinine 0.67, HCT 38.1, TSH normal, LDL 74, HDL 52, TGs 68 Labs (10/16): K 4.4, creatinine 0.74 Labs (11/17): LDL 91, K 4, creatinine 0.88  PMH: 1. LBBB 2. GERD 3. HTN  4. Depression 5. Hyperlipidemia 6. Chest pain: Adenosine sestamibi (1/14) with no evidence of ischemia or infarction, EF 63%. LHC (8/14) with mild nonobstructive CAD.  7. HCM: Suspected by echo. Echo (1/14) with EF 55%, mild asymmetric focal basal septal hypertrophy with mild SAM and mild MR, LVOT peak gradient 15 mmHg. Echo (9/16) with EF 55-60%, moderate asymmetric septal hypertrophy, no LVOT gradient noted at rest, systolic anterior motion of the mitral valve with moderate MR.  - ETT (11/16): Normal BP response, no NSVT, some PVCs noted.  Not a high risk study.  8. Complete heart block: St Jude PPM 2012.  9. Atrial fibrillation: Brief, noted by EP on device interrogation in 2/18.   SH: Lives in  [place].  Quit smoking in 1990s.  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    FH: 2 brothers with CABG in their 39s, mother with PCM and CHF, no sudden cardiac death.  ROS: All systems reviewed and negative except as per HPI.   Current Outpatient Prescriptions  Medication Sig Dispense Refill  . ALPRAZolam (XANAX) 1 MG tablet TAKE 1/2 TO 1 TABLET THREE TIMES DAILY AS NEEDED FOR ANXIETY 270 tablet 0  . Ascorbic Acid (VITAMIN C PO) Take 1 tablet by mouth daily.     Marland Kitchen aspirin EC 81 MG tablet Take 81 mg by mouth daily.    Marland Kitchen BIOTIN PO Take 1 tablet by mouth daily.     . Calcium Carbonate-Vitamin D (CALCIUM + D PO) Take 1 tablet by mouth daily.     . Cholecalciferol (VITAMIN D PO) Take 5,000 Units by mouth daily.     . citalopram (CELEXA) 40 MG tablet TAKE 1 TABLET (40 MG TOTAL) BY MOUTH DAILY. 90 tablet 1  . Coenzyme Q10 (CO Q 10 PO) Take 1 capsule by mouth daily.    . fluticasone (FLONASE) 50 MCG/ACT nasal spray Place 2 sprays into both nostrils daily. 16 g 0  . FOLIC ACID PO Take 1 tablet by mouth daily.     Marland Kitchen lisinopril (PRINIVIL,ZESTRIL) 20 MG tablet TAKE 1 TABLET BY MOUTH EVERY DAY FOR BLOOD PRESSURE 90 tablet 1  . MAGNESIUM PO Take 500 mg by mouth daily.     Marland Kitchen  metoprolol succinate (TOPROL-XL) 50 MG 24 hr tablet TAKE 1 TABLET EVERY DAY FOR BLOOD PRESSURE 90 tablet 1  . Multiple Vitamin (MULTIVITAMIN) tablet Take 1 tablet by mouth daily.    Marland Kitchen PROCTOZONE-HC 2.5 % rectal cream PLACE 1 APPLICATION RECTALLY 2 (TWO) TIMES DAILY. 28.35 g 5  . ranitidine (ZANTAC) 300 MG tablet Take 300 mg by mouth daily as needed for heartburn.    . Tretinoin, Facial Wrinkles, (TRETINOIN, EMOLLIENT,) 0.05 % CREA APPLY TO AFFECTED AREA DAILY AS DIRECTED 40 g 2  . hydrochlorothiazide (HYDRODIURIL) 25 MG tablet Take 25 mg by mouth daily as needed (swelling).      No current facility-administered medications for this encounter.     BP 124/69   Pulse 62   Wt 160 lb 12 oz (72.9 kg)   SpO2 99%    BMI 27.81 kg/m  General: NAD Neck: JVP 7 cm, no thyromegaly or thyroid nodule.  Lungs: CTAB CV: Nondisplaced PMI.  Heart regular S1/S2, no S3/S4, 1/6 systolic murmur RUSB and apex.  The murmur does not change with standing or valsalva.  No peripheral edema.  No carotid bruit.  Normal pedal pulses.  Abdomen: Soft, nontender, no hepatosplenomegaly, no distention.  Neurologic: Alert and oriented x 3.  Psych: Normal affect. Extremities: No clubbing or cyanosis.   Assessment/Plan: 1. Hypertrophic cardiomyopathy: Based on her last echo in 2016, Alexandra Wheeler appears to have hypertrophic cardiomyopathy with asymmetric septal hypertrophy and mitral valve SAM.  No significant LVOT gradient measured at rest.  Systolic murmur on exam.  Minimally symptomatic, NYHA class II.  Prior ETT from 11/16 was low risk.  No family history of sudden death.   - Continue Toprol XL 50 mg daily.  - SCD risk stratification: No family history of sudden death.  Septum is not markedly thickened.  ETT showed no NSVT and normal BP response to exercise. Finally, unable to get cardiac MRI given presence of pacemaker. No high risk features.  - She has one daughter.  Daughter should get echo to screen for HCM.  - I am going to get a repeat echo to reassess for LVOT gradient and SAM.  Can increase Toprol XL if she has a significant LVOT gradient.  2. Complete heart block: Has St Jude PPM, followed by Dr Alexandra Wheeler.   3. Chest pain: Atypical.  Normal LHC in 2012.    4. Atrial fibrillation: CHADSVASC = 4.  Very small burden noted by EP in 2/18, anticoagulation not started.  Today, interrogation of device showed no atrial fibrillation since 2/18.  If there is evidence for atrial fibrillation in the future, would start DOAC.   Followup in 6 months.    Alexandra Wheeler 02/28/2017

## 2017-02-28 NOTE — Patient Instructions (Signed)
Your physician has requested that you have an echocardiogram. Echocardiography is a painless test that uses sound waves to create images of your heart. It provides your doctor with information about the size and shape of your heart and how well your heart's chambers and valves are working. This procedure takes approximately one hour. There are no restrictions for this procedure.  We will contact you in 6 months to schedule your next appointment.  

## 2017-03-10 ENCOUNTER — Other Ambulatory Visit: Payer: Self-pay

## 2017-03-10 ENCOUNTER — Ambulatory Visit (HOSPITAL_COMMUNITY): Payer: Medicare PPO | Attending: Cardiology

## 2017-03-10 DIAGNOSIS — I4891 Unspecified atrial fibrillation: Secondary | ICD-10-CM | POA: Diagnosis not present

## 2017-03-10 DIAGNOSIS — E785 Hyperlipidemia, unspecified: Secondary | ICD-10-CM | POA: Insufficient documentation

## 2017-03-10 DIAGNOSIS — I447 Left bundle-branch block, unspecified: Secondary | ICD-10-CM | POA: Insufficient documentation

## 2017-03-10 DIAGNOSIS — I082 Rheumatic disorders of both aortic and tricuspid valves: Secondary | ICD-10-CM | POA: Insufficient documentation

## 2017-03-10 DIAGNOSIS — I421 Obstructive hypertrophic cardiomyopathy: Secondary | ICD-10-CM | POA: Insufficient documentation

## 2017-03-10 DIAGNOSIS — I10 Essential (primary) hypertension: Secondary | ICD-10-CM | POA: Insufficient documentation

## 2017-03-27 ENCOUNTER — Other Ambulatory Visit: Payer: Self-pay | Admitting: Internal Medicine

## 2017-04-04 ENCOUNTER — Other Ambulatory Visit: Payer: Self-pay | Admitting: Internal Medicine

## 2017-04-10 ENCOUNTER — Ambulatory Visit (INDEPENDENT_AMBULATORY_CARE_PROVIDER_SITE_OTHER): Payer: Medicare PPO | Admitting: *Deleted

## 2017-04-10 DIAGNOSIS — I442 Atrioventricular block, complete: Secondary | ICD-10-CM | POA: Diagnosis not present

## 2017-04-10 NOTE — Progress Notes (Signed)
Remote pacemaker transmission.   

## 2017-04-11 ENCOUNTER — Ambulatory Visit (INDEPENDENT_AMBULATORY_CARE_PROVIDER_SITE_OTHER): Payer: Medicare PPO | Admitting: Internal Medicine

## 2017-04-11 ENCOUNTER — Encounter: Payer: Self-pay | Admitting: Internal Medicine

## 2017-04-11 VITALS — BP 112/70 | HR 72 | Temp 97.3°F | Resp 16 | Ht 63.75 in | Wt 156.4 lb

## 2017-04-11 DIAGNOSIS — R7303 Prediabetes: Secondary | ICD-10-CM | POA: Diagnosis not present

## 2017-04-11 DIAGNOSIS — E782 Mixed hyperlipidemia: Secondary | ICD-10-CM | POA: Diagnosis not present

## 2017-04-11 DIAGNOSIS — K219 Gastro-esophageal reflux disease without esophagitis: Secondary | ICD-10-CM | POA: Diagnosis not present

## 2017-04-11 DIAGNOSIS — Z79899 Other long term (current) drug therapy: Secondary | ICD-10-CM

## 2017-04-11 DIAGNOSIS — I10 Essential (primary) hypertension: Secondary | ICD-10-CM

## 2017-04-11 DIAGNOSIS — E559 Vitamin D deficiency, unspecified: Secondary | ICD-10-CM

## 2017-04-11 LAB — CBC WITH DIFFERENTIAL/PLATELET
BASOS ABS: 41 {cells}/uL (ref 0–200)
Basophils Relative: 1 %
EOS ABS: 82 {cells}/uL (ref 15–500)
EOS PCT: 2 %
HCT: 39.7 % (ref 35.0–45.0)
Hemoglobin: 12.8 g/dL (ref 11.7–15.5)
LYMPHS PCT: 44 %
Lymphs Abs: 1804 cells/uL (ref 850–3900)
MCH: 28 pg (ref 27.0–33.0)
MCHC: 32.2 g/dL (ref 32.0–36.0)
MCV: 86.9 fL (ref 80.0–100.0)
MPV: 9.8 fL (ref 7.5–12.5)
Monocytes Absolute: 410 cells/uL (ref 200–950)
Monocytes Relative: 10 %
NEUTROS PCT: 43 %
Neutro Abs: 1763 cells/uL (ref 1500–7800)
PLATELETS: 213 10*3/uL (ref 140–400)
RBC: 4.57 MIL/uL (ref 3.80–5.10)
RDW: 14.4 % (ref 11.0–15.0)
WBC: 4.1 10*3/uL (ref 3.8–10.8)

## 2017-04-11 NOTE — Patient Instructions (Signed)

## 2017-04-11 NOTE — Progress Notes (Signed)
This very nice 76 y.o. WWF presents for 3 month follow up with Hypertension, Hyperlipidemia, Pre-Diabetes and Vitamin D Deficiency.  Patient also has GERD controlled on meds.      Patient is treated for HTN & BP has been controlled at home. Today's BP is at goal - 112/70.  In 06/2013, patient presented asymptomatic with CHB culminating in implantation of a PPM by Dr Johney FrameAllred.  Patient has had no complaints of any cardiac type chest pain, palpitations, dyspnea/orthopnea/PND, dizziness, claudication, or dependent edema.     Hyperlipidemia is controlled with diet & meds. Patient denies myalgias or other med SE's. Last Lipids were  Lab Results  Component Value Date   CHOL 170 09/27/2016   HDL 62 09/27/2016   LDLCALC 91 09/27/2016   TRIG 84 09/27/2016   CHOLHDL 2.7 09/27/2016      Also, the patient has history of PreDiabetes (A1c 5.9% in 2011 and 5.6% in 2017) and has had no symptoms of reactive hypoglycemia, diabetic polys, paresthesias or visual blurring.  Last A1c was  Lab Results  Component Value Date   HGBA1C 5.5 09/27/2016      Further, the patient also has history of Vitamin D Deficiency ("34" in 2008)  and supplements vitamin D without any suspected side-effects. Last vitamin D was at goal:  Lab Results  Component Value Date   VD25OH 7075 09/27/2016   Current Outpatient Prescriptions on File Prior to Visit  Medication Sig  . ALPRAZolam (XANAX) 1 MG tablet TAKE 1/2 TO 1 TABLET THREE TIMES DAILY AS NEEDED FOR ANXIETY  . Ascorbic Acid (VITAMIN C PO) Take 1 tablet by mouth daily.   Marland Kitchen. aspirin EC 81 MG tablet Take 81 mg by mouth daily.  Marland Kitchen. BIOTIN PO Take 1 tablet by mouth daily.   . Calcium Carbonate-Vitamin D (CALCIUM + D PO) Take 1 tablet by mouth daily.   . Cholecalciferol (VITAMIN D PO) Take 5,000 Units by mouth daily.   . citalopram (CELEXA) 40 MG tablet TAKE 1 TABLET (40 MG TOTAL) BY MOUTH DAILY.  Marland Kitchen. Coenzyme Q10 (CO Q 10 PO) Take 1 capsule by mouth daily.  . fluticasone (FLONASE)  50 MCG/ACT nasal spray Place 2 sprays into both nostrils daily.  Marland Kitchen. FOLIC ACID PO Take 1 tablet by mouth daily.   . hydrochlorothiazide (HYDRODIURIL) 25 MG tablet Take 25 mg by mouth daily as needed (swelling).   Marland Kitchen. lisinopril (PRINIVIL,ZESTRIL) 20 MG tablet TAKE 1 TABLET BY MOUTH EVERY DAY FOR BLOOD PRESSURE  . MAGNESIUM PO Take 500 mg by mouth daily.   . metoprolol succinate (TOPROL-XL) 50 MG 24 hr tablet TAKE 1 TABLET EVERY DAY FOR BLOOD PRESSURE  . Multiple Vitamin (MULTIVITAMIN) tablet Take 1 tablet by mouth daily.  Marland Kitchen. PROCTOZONE-HC 2.5 % rectal cream PLACE 1 APPLICATION RECTALLY 2 (TWO) TIMES DAILY.  . ranitidine (ZANTAC) 300 MG tablet Take 300 mg by mouth daily as needed for heartburn.  . Tretinoin, Facial Wrinkles, (TRETINOIN, EMOLLIENT,) 0.05 % CREA APPLY TO AFFECTED AREA EVERY DAY AS DIRECTED   No current facility-administered medications on file prior to visit.    Allergies  Allergen Reactions  . Actonel [Risedronate Sodium]     Pt didn't like the way it made her feel   . Boniva [Ibandronic Acid]     Pt didn't like the way it made her feel  . Evista [Raloxifene]     Pt didn't like the way it made her feel  . Fosamax [Alendronate Sodium]  Pt didn't like the way it made her feel   . Levsin [Hyoscyamine Sulfate]     unknown  . Mobic [Meloxicam]     unknown   . Topamax [Topiramate]     unknown   . Zoloft [Sertraline Hcl]     unknown    PMHx:   Past Medical History:  Diagnosis Date  . Anxiety   . Atrophic vaginitis   . Chest pain    a. Adenosine sestamibi (1/14) with no evidence of ischemia or infarction, EF 63%.   . Colon polyps   . Complete heart block (HCC) 06-2013   status post pacemaker implantation by Dr Johney Frame 06-11-2013  . Depression   . DI (detrusor instability)   . GERD (gastroesophageal reflux disease)    History + Hyplori via EGD  . H/O echocardiogram    a. Echo 11/2012:EF 50-55%, focal basal septal hypertrophy with some mitral valve SAM (mild) and  MR.  This may be a sigmoid septum with advanced age or could be a variant of hypertrophic cardiomyopathy.  Marland Kitchen HTN (hypertension)   . Hyperlipidemia   . LBBB (left bundle branch block)   . Leukopenia 05/19/2014  . Lymphocytosis 05/19/2014  . Migraines   . Osteoporosis   . Prediabetes   . Vitamin D deficiency    Immunization History  Administered Date(s) Administered  . Influenza Split 08/06/2014  . Influenza, High Dose Seasonal PF 09/01/2015  . Pneumococcal Conjugate-13 09/01/2015  . Pneumococcal-Unspecified 10/08/2010  . Td 05/09/2011  . Zoster 05/09/2011   Past Surgical History:  Procedure Laterality Date  . BREAST SURGERY     Breast cyst  . HEMORRHOID SURGERY    . LEFT HEART CATHETERIZATION WITH CORONARY ANGIOGRAM N/A 06/11/2013   Procedure: LEFT HEART CATHETERIZATION WITH CORONARY ANGIOGRAM;  Surgeon: Kathleene Hazel, MD;  Location: [facility] CATH LAB;  Service: Cardiovascular;  Laterality: N/A;  . PACEMAKER INSERTION  06-11-2013   STJ dual chamber pacemaker implanted by Dr Johney Frame for complete heart block  . PERMANENT PACEMAKER INSERTION Left 06/11/2013   Procedure: PERMANENT PACEMAKER INSERTION;  Surgeon: Kathleene Hazel, MD;  Location: [facility] CATH LAB;  Service: Cardiovascular;  Laterality: Left;  . PERMANENT PACEMAKER INSERTION N/A 06/11/2013   Procedure: PERMANENT PACEMAKER INSERTION;  Surgeon: Hillis Range, MD;  Location: [facility] CATH LAB;  Service: Cardiovascular;  Laterality: N/A;  . TEMPORARY PACEMAKER INSERTION N/A 06/11/2013   Procedure: TEMPORARY PACEMAKER INSERTION;  Surgeon: Kathleene Hazel, MD;  Location: [facility] CATH LAB;  Service: Cardiovascular;  Laterality: N/A;  . TUBAL LIGATION     FHx:    Reviewed / unchanged  SHx:    Reviewed / unchanged  Systems Review:  Constitutional: Denies fever, chills, wt changes, headaches, insomnia, fatigue, night sweats, change in appetite. Eyes: Denies redness, blurred vision, diplopia, discharge, itchy, watery eyes.  ENT: Denies  discharge, congestion, post nasal drip, epistaxis, sore throat, earache, hearing loss, dental pain, tinnitus, vertigo, sinus pain, snoring.  CV: Denies chest pain, palpitations, irregular heartbeat, syncope, dyspnea, diaphoresis, orthopnea, PND, claudication or edema. Respiratory: denies cough, dyspnea, DOE, pleurisy, hoarseness, laryngitis, wheezing.  Gastrointestinal: Denies dysphagia, odynophagia, heartburn, reflux, water brash, abdominal pain or cramps, nausea, vomiting, bloating, diarrhea, constipation, hematemesis, melena, hematochezia  or hemorrhoids. Genitourinary: Denies dysuria, frequency, urgency, nocturia, hesitancy, discharge, hematuria or flank pain. Musculoskeletal: Denies arthralgias, myalgias, stiffness, jt. swelling, pain, limping or strain/sprain.  Skin: Denies pruritus, rash, hives, warts, acne, eczema or change in skin lesion(s). Neuro: No weakness, tremor, incoordination, spasms, paresthesia or pain. Psychiatric:  Denies confusion, memory loss or sensory loss. Endo: Denies change in weight, skin or hair change.  Heme/Lymph: No excessive bleeding, bruising or enlarged lymph nodes.  Physical Exam  BP 112/70   Pulse 72   Temp 97.3 F (36.3 C)   Resp 16   Ht 5' 3.75" (1.619 m)   Wt 156 lb 6.4 oz (70.9 kg)   BMI 27.06 kg/m   Appears well nourished, well groomed  and in no distress.  Eyes: PERRLA, EOMs, conjunctiva no swelling or erythema. Sinuses: No frontal/maxillary tenderness ENT/Mouth: EAC's clear, TM's nl w/o erythema, bulging. Nares clear w/o erythema, swelling, exudates. Oropharynx clear without erythema or exudates. Oral hygiene is good. Tongue normal, non obstructing. Hearing intact.  Neck: Supple. Thyroid nl. Car 2+/2+ without bruits, nodes or JVD. Chest: Respirations nl with BS clear & equal w/o rales, rhonchi, wheezing or stridor.  Cor: Heart sounds normal w/ regular rate and rhythm without sig. murmurs, gallops, clicks or rubs. Peripheral pulses normal and  equal  without edema.  Abdomen: Soft & bowel sounds normal. Non-tender w/o guarding, rebound, hernias, masses or organomegaly.  Lymphatics: Unremarkable.  Musculoskeletal: Full ROM all peripheral extremities, joint stability, 5/5 strength and normal gait.  Skin: Warm, dry without exposed rashes, lesions or ecchymosis apparent.  Neuro: Cranial nerves intact, reflexes equal bilaterally. Sensory-motor testing grossly intact. Tendon reflexes grossly intact.  Pysch: Alert & oriented x 3.  Insight and judgement nl & appropriate. No ideations.  Assessment and Plan:  1. Essential hypertension  - Continue medication, monitor blood pressure at home.  - Continue DASH diet. Reminder to go to the ER if any CP,  SOB, nausea, dizziness, severe HA, changes vision/speech,  left arm numbness and tingling and jaw pain.  - BASIC METABOLIC PANEL WITH GFR - Magnesium - TSH - CBC with Differential/Platelet  2. Hyperlipidemia, mixed  - Continue diet/meds, exercise,& lifestyle modifications.  - Continue monitor periodic cholesterol/liver & renal functions   - Hepatic function panel - Lipid panel - TSH  3. Prediabetes  - Continue diet, exercise, lifestyle modifications.  - Monitor appropriate labs.  - Hemoglobin A1c - Insulin, random  4. Vitamin D deficiency  - Continue supplementation.  - VITAMIN D 25 Hydroxy   5. Gastroesophageal reflux disease   6. Medication management  - BASIC METABOLIC PANEL WITH GFR - Hepatic function panel - Magnesium - Lipid panel - TSH - Hemoglobin A1c - Insulin, random - VITAMIN D 25 Hydroxy  - CBC with Differential/Platelet       Discussed  regular exercise, BP monitoring, weight control to achieve/maintain BMI less than 25 and discussed med and SE's. Recommended labs to assess and monitor clinical status with further disposition pending results of labs. Over 30 minutes of exam, counseling, chart review was performed.

## 2017-04-12 LAB — CUP PACEART REMOTE DEVICE CHECK
Brady Statistic AP VP Percent: 87 %
Brady Statistic AP VS Percent: 1 %
Brady Statistic AS VP Percent: 13 %
Brady Statistic RA Percent Paced: 87 %
Brady Statistic RV Percent Paced: 99 %
Date Time Interrogation Session: 20180604074908
Implantable Lead Implant Date: 20140805
Implantable Lead Model: 1944
Implantable Lead Model: 1948
Lead Channel Impedance Value: 610 Ohm
Lead Channel Pacing Threshold Amplitude: 0.875 V
Lead Channel Pacing Threshold Pulse Width: 0.4 ms
Lead Channel Sensing Intrinsic Amplitude: 12 mV
Lead Channel Setting Pacing Amplitude: 1.125
Lead Channel Setting Pacing Amplitude: 2 V
Lead Channel Setting Pacing Pulse Width: 0.4 ms
Lead Channel Setting Sensing Sensitivity: 4 mV
MDC IDC LEAD IMPLANT DT: 20140805
MDC IDC LEAD LOCATION: 753859
MDC IDC LEAD LOCATION: 753860
MDC IDC MSMT BATTERY REMAINING LONGEVITY: 124 mo
MDC IDC MSMT BATTERY REMAINING PERCENTAGE: 95.5 %
MDC IDC MSMT BATTERY VOLTAGE: 2.98 V
MDC IDC MSMT LEADCHNL RA IMPEDANCE VALUE: 530 Ohm
MDC IDC MSMT LEADCHNL RA PACING THRESHOLD AMPLITUDE: 0.25 V
MDC IDC MSMT LEADCHNL RA SENSING INTR AMPL: 2.1 mV
MDC IDC MSMT LEADCHNL RV PACING THRESHOLD PULSEWIDTH: 0.4 ms
MDC IDC PG IMPLANT DT: 20140805
MDC IDC PG SERIAL: 7523768
MDC IDC STAT BRADY AS VS PERCENT: 1 %
Pulse Gen Model: 2240

## 2017-04-12 LAB — HEMOGLOBIN A1C
HEMOGLOBIN A1C: 5.7 % — AB (ref <5.7)
MEAN PLASMA GLUCOSE: 117 mg/dL

## 2017-04-12 LAB — BASIC METABOLIC PANEL WITH GFR
BUN: 9 mg/dL (ref 7–25)
CHLORIDE: 102 mmol/L (ref 98–110)
CO2: 23 mmol/L (ref 20–31)
CREATININE: 0.87 mg/dL (ref 0.60–0.93)
Calcium: 9.3 mg/dL (ref 8.6–10.4)
GFR, Est African American: 75 mL/min (ref >=60)
GFR, Est Non African American: 65 mL/min (ref >=60)
GLUCOSE: 87 mg/dL (ref 65–99)
Potassium: 4.3 mmol/L (ref 3.5–5.3)
Sodium: 138 mmol/L (ref 135–146)

## 2017-04-12 LAB — LIPID PANEL
Cholesterol: 174 mg/dL (ref <200)
HDL: 62 mg/dL (ref >50)
LDL CALC: 92 mg/dL (ref <100)
Total CHOL/HDL Ratio: 2.8 Ratio (ref <5.0)
Triglycerides: 102 mg/dL (ref <150)
VLDL: 20 mg/dL (ref <30)

## 2017-04-12 LAB — MAGNESIUM: Magnesium: 2.2 mg/dL (ref 1.5–2.5)

## 2017-04-12 LAB — HEPATIC FUNCTION PANEL
ALBUMIN: 3.9 g/dL (ref 3.6–5.1)
ALT: 9 U/L (ref 6–29)
AST: 15 U/L (ref 10–35)
Alkaline Phosphatase: 76 U/L (ref 33–130)
BILIRUBIN TOTAL: 0.5 mg/dL (ref 0.2–1.2)
Bilirubin, Direct: 0.1 mg/dL (ref <=0.2)
Indirect Bilirubin: 0.4 mg/dL (ref 0.2–1.2)
TOTAL PROTEIN: 6.6 g/dL (ref 6.1–8.1)

## 2017-04-12 LAB — TSH: TSH: 1.06 m[IU]/L

## 2017-04-12 LAB — INSULIN, RANDOM: INSULIN: 3.7 u[IU]/mL (ref 2.0–19.6)

## 2017-04-12 LAB — VITAMIN D 25 HYDROXY (VIT D DEFICIENCY, FRACTURES): Vit D, 25-Hydroxy: 59 ng/mL (ref 30–100)

## 2017-04-18 ENCOUNTER — Encounter: Payer: Self-pay | Admitting: Cardiology

## 2017-05-31 ENCOUNTER — Other Ambulatory Visit: Payer: Self-pay | Admitting: Internal Medicine

## 2017-05-31 DIAGNOSIS — M810 Age-related osteoporosis without current pathological fracture: Secondary | ICD-10-CM

## 2017-06-02 ENCOUNTER — Other Ambulatory Visit: Payer: Self-pay | Admitting: Internal Medicine

## 2017-06-02 DIAGNOSIS — Z1231 Encounter for screening mammogram for malignant neoplasm of breast: Secondary | ICD-10-CM

## 2017-06-15 ENCOUNTER — Other Ambulatory Visit: Payer: Medicare PPO

## 2017-06-29 ENCOUNTER — Other Ambulatory Visit: Payer: Self-pay | Admitting: Internal Medicine

## 2017-07-05 ENCOUNTER — Ambulatory Visit
Admission: RE | Admit: 2017-07-05 | Discharge: 2017-07-05 | Disposition: A | Discharge status: Home / Self Care | Payer: Medicare PPO | Source: Ambulatory Visit | Attending: Internal Medicine | Admitting: Internal Medicine

## 2017-07-05 DIAGNOSIS — M810 Age-related osteoporosis without current pathological fracture: Secondary | ICD-10-CM | POA: Diagnosis not present

## 2017-07-05 DIAGNOSIS — Z1231 Encounter for screening mammogram for malignant neoplasm of breast: Secondary | ICD-10-CM

## 2017-07-05 DIAGNOSIS — Z780 Asymptomatic menopausal state: Secondary | ICD-10-CM | POA: Diagnosis not present

## 2017-07-06 ENCOUNTER — Other Ambulatory Visit: Payer: Self-pay | Admitting: Internal Medicine

## 2017-07-06 DIAGNOSIS — R928 Other abnormal and inconclusive findings on diagnostic imaging of breast: Secondary | ICD-10-CM

## 2017-07-12 ENCOUNTER — Ambulatory Visit (INDEPENDENT_AMBULATORY_CARE_PROVIDER_SITE_OTHER): Payer: Medicare PPO | Admitting: *Deleted

## 2017-07-12 DIAGNOSIS — I442 Atrioventricular block, complete: Secondary | ICD-10-CM

## 2017-07-13 ENCOUNTER — Ambulatory Visit
Admission: RE | Admit: 2017-07-13 | Discharge: 2017-07-13 | Disposition: A | Discharge status: Home / Self Care | Payer: Medicare PPO | Source: Ambulatory Visit | Attending: Internal Medicine | Admitting: Internal Medicine

## 2017-07-13 ENCOUNTER — Other Ambulatory Visit: Payer: Self-pay | Admitting: Internal Medicine

## 2017-07-13 DIAGNOSIS — R928 Other abnormal and inconclusive findings on diagnostic imaging of breast: Secondary | ICD-10-CM

## 2017-07-13 DIAGNOSIS — N63 Unspecified lump in unspecified breast: Secondary | ICD-10-CM

## 2017-07-13 DIAGNOSIS — N6489 Other specified disorders of breast: Secondary | ICD-10-CM

## 2017-07-13 DIAGNOSIS — R922 Inconclusive mammogram: Secondary | ICD-10-CM | POA: Diagnosis not present

## 2017-07-13 NOTE — Progress Notes (Signed)
Remote pacemaker transmission.   

## 2017-07-18 ENCOUNTER — Encounter: Payer: Self-pay | Admitting: Cardiology

## 2017-07-20 ENCOUNTER — Other Ambulatory Visit: Payer: Self-pay | Admitting: Internal Medicine

## 2017-07-20 DIAGNOSIS — I10 Essential (primary) hypertension: Secondary | ICD-10-CM

## 2017-08-02 LAB — CUP PACEART REMOTE DEVICE CHECK
Battery Voltage: 2.98 V
Brady Statistic AP VP Percent: 88 %
Brady Statistic AP VS Percent: 1 %
Brady Statistic AS VP Percent: 12 %
Brady Statistic AS VS Percent: 1 %
Date Time Interrogation Session: 20180905060013
Implantable Lead Implant Date: 20140805
Implantable Lead Location: 753859
Implantable Pulse Generator Implant Date: 20140805
Lead Channel Impedance Value: 610 Ohm
Lead Channel Pacing Threshold Amplitude: 0.75 V
Lead Channel Pacing Threshold Pulse Width: 0.4 ms
Lead Channel Sensing Intrinsic Amplitude: 12 mV
Lead Channel Setting Pacing Amplitude: 1 V
Lead Channel Setting Pacing Amplitude: 2 V
Lead Channel Setting Pacing Pulse Width: 0.4 ms
Lead Channel Setting Sensing Sensitivity: 4 mV
MDC IDC LEAD IMPLANT DT: 20140805
MDC IDC LEAD LOCATION: 753860
MDC IDC MSMT BATTERY REMAINING LONGEVITY: 124 mo
MDC IDC MSMT BATTERY REMAINING PERCENTAGE: 95.5 %
MDC IDC MSMT LEADCHNL RA IMPEDANCE VALUE: 540 Ohm
MDC IDC MSMT LEADCHNL RA PACING THRESHOLD AMPLITUDE: 0.25 V
MDC IDC MSMT LEADCHNL RA PACING THRESHOLD PULSEWIDTH: 0.4 ms
MDC IDC MSMT LEADCHNL RA SENSING INTR AMPL: 2.6 mV
MDC IDC STAT BRADY RA PERCENT PACED: 88 %
MDC IDC STAT BRADY RV PERCENT PACED: 99 %
Pulse Gen Model: 2240
Pulse Gen Serial Number: 7523768

## 2017-08-23 ENCOUNTER — Other Ambulatory Visit: Payer: Self-pay | Admitting: Internal Medicine

## 2017-10-11 ENCOUNTER — Ambulatory Visit (INDEPENDENT_AMBULATORY_CARE_PROVIDER_SITE_OTHER): Payer: Medicare PPO | Admitting: *Deleted

## 2017-10-11 DIAGNOSIS — I442 Atrioventricular block, complete: Secondary | ICD-10-CM

## 2017-10-11 NOTE — Progress Notes (Signed)
Remote pacemaker transmission.   

## 2017-10-12 ENCOUNTER — Encounter: Payer: Self-pay | Admitting: Cardiology

## 2017-10-12 LAB — CUP PACEART REMOTE DEVICE CHECK
Battery Remaining Percentage: 95.5 %
Battery Voltage: 2.98 V
Brady Statistic AP VP Percent: 87 %
Brady Statistic AP VS Percent: 1 %
Brady Statistic AS VS Percent: 1 %
Date Time Interrogation Session: 20181205090925
Implantable Lead Implant Date: 20140805
Implantable Lead Location: 753859
Implantable Lead Location: 753860
Implantable Lead Model: 1948
Implantable Pulse Generator Implant Date: 20140805
Lead Channel Impedance Value: 550 Ohm
Lead Channel Pacing Threshold Amplitude: 0.875 V
Lead Channel Pacing Threshold Pulse Width: 0.4 ms
Lead Channel Pacing Threshold Pulse Width: 0.4 ms
Lead Channel Setting Pacing Amplitude: 1.125
Lead Channel Setting Pacing Amplitude: 2 V
Lead Channel Setting Pacing Pulse Width: 0.4 ms
MDC IDC LEAD IMPLANT DT: 20140805
MDC IDC MSMT BATTERY REMAINING LONGEVITY: 125 mo
MDC IDC MSMT LEADCHNL RA PACING THRESHOLD AMPLITUDE: 0.25 V
MDC IDC MSMT LEADCHNL RA SENSING INTR AMPL: 2.7 mV
MDC IDC MSMT LEADCHNL RV IMPEDANCE VALUE: 610 Ohm
MDC IDC MSMT LEADCHNL RV SENSING INTR AMPL: 12 mV
MDC IDC SET LEADCHNL RV SENSING SENSITIVITY: 4 mV
MDC IDC STAT BRADY AS VP PERCENT: 13 %
MDC IDC STAT BRADY RA PERCENT PACED: 87 %
MDC IDC STAT BRADY RV PERCENT PACED: 99 %
Pulse Gen Model: 2240
Pulse Gen Serial Number: 7523768

## 2017-10-22 NOTE — Progress Notes (Signed)
[facility] Lucky Cowboy, M.D.     Dyanne Carrel. Steffanie Dunn, P.A.-C Judd Gaudier, DNP [facility] [address] [place], [place]. [address] Telephone [phone] Telefax [phone] Annual Screening/Preventative Visit & Comprehensive Evaluation &  Examination     This very nice 76 y.o. [facility] presents for a Screening/Preventative Visit & comprehensive evaluation and management of multiple medical co-morbidities.  Patient has been followed for HTN, ASHD/CHB/PPM, Prediabetes, Hyperlipidemia and Vitamin D Deficiency. Patient has GERD controlled w/diet & Ranitidine. Patient also reports seeing Dr Regino Schultze for chronic neck & LBP and has received an EDSI in the neck about a year ago.       HTN predates since 2005. Patient's BP has been controlled at home and patient denies any cardiac symptoms as chest pain, palpitations, shortness of breath, dizziness or ankle swelling.  Patient presented asymptomatic with CHB In 06/2013 culminating in implantation of a PPM & is followed by Dr Johney Frame.  Today's BP is at goal - 116/80.      Patient's hyperlipidemia is controlled with diet and medications. Patient denies myalgias or other medication SE's. Last lipids were at goal: Lab Results  Component Value Date   CHOL 183 10/23/2017   HDL 53 10/23/2017   LDLCALC 92 04/11/2017   TRIG 83 10/23/2017   CHOLHDL 3.5 10/23/2017      Patient has prediabetes (A1c 5.9% / 2011 & 2016) and patient denies reactive hypoglycemic symptoms, visual blurring, diabetic polys, or paresthesias. Last A1c was almost at goal: Lab Results  Component Value Date   HGBA1C 5.7 (H) 04/11/2017      Finally, patient has history of Vitamin D Deficiency ("34" / 2008)  and last Vitamin D was at goal: Lab Results  Component Value Date   VD25OH 59 04/11/2017   Current Outpatient Medications on File Prior to Visit  Medication Sig  . ALPRAZolam (XANAX) 1 MG tablet TAKE 1/2 TO 1  TABLET THREE TIMES DAILY AS NEEDED FOR ANXIETY  . Ascorbic Acid (VITAMIN C PO) Take 1 tablet by mouth daily.   Marland Kitchen aspirin EC 81 MG tablet Take 81 mg by mouth daily.  Marland Kitchen BIOTIN PO Take 1 tablet by mouth daily.   . Calcium Carbonate-Vitamin D (CALCIUM + D PO) Take 1 tablet by mouth daily.   . Cholecalciferol (VITAMIN D PO) Take 5,000 Units by mouth daily.   . citalopram (CELEXA) 40 MG tablet TAKE 1 TABLET (40 MG TOTAL) BY MOUTH DAILY.  Marland Kitchen Coenzyme Q10 (CO Q 10 PO) Take 1 capsule by mouth daily.  . fluticasone (FLONASE) 50 MCG/ACT nasal spray Place 2 sprays into both nostrils daily.  Marland Kitchen FOLIC ACID PO Take 1 tablet by mouth daily.   . hydrochlorothiazide (HYDRODIURIL) 25 MG tablet Take 25 mg by mouth daily as needed (swelling).   Marland Kitchen lisinopril (PRINIVIL,ZESTRIL) 20 MG tablet TAKE 1 TABLET BY MOUTH EVERY DAY FOR BLOOD PRESSURE (Patient not taking: Reported on 10/23/2017)  . MAGNESIUM PO Take 500 mg by mouth daily.   . metoprolol succinate (TOPROL-XL) 50 MG 24 hr tablet TAKE 1 TABLET EVERY DAY FOR BLOOD PRESSURE  . Multiple Vitamin (MULTIVITAMIN) tablet Take 1 tablet by mouth daily.  Marland Kitchen PROCTOZONE-HC 2.5 % rectal cream PLACE 1 APPLICATION RECTALLY 2 (TWO) TIMES DAILY.  . ranitidine (ZANTAC) 300 MG tablet Take 300 mg by mouth daily as needed for heartburn.  . Tretinoin, Facial Wrinkles, (TRETINOIN, EMOLLIENT,) 0.05 % CREA APPLY TO AFFECTED AREA EVERY DAY AS DIRECTED  No current facility-administered medications on file prior to visit.    Allergies  Allergen Reactions  . Actonel [Risedronate Sodium]     Pt didn't like the way it made her feel   . Boniva [Ibandronic Acid]     Pt didn't like the way it made her feel  . Evista [Raloxifene]     Pt didn't like the way it made her feel  . Fosamax [Alendronate Sodium]     Pt didn't like the way it made her feel   . Levsin [Hyoscyamine Sulfate]     unknown  . Mobic [Meloxicam]     unknown   . Topamax [Topiramate]     unknown   . Zoloft  [Sertraline Hcl]     unknown    Past Medical History:  Diagnosis Date  . Anxiety   . Atrophic vaginitis   . Chest pain    a. Adenosine sestamibi (1/14) with no evidence of ischemia or infarction, EF 63%.   . Colon polyps   . Complete heart block (HCC) 06-2013   status post pacemaker implantation by Dr Johney FrameAllred 06-11-2013  . Depression   . DI (detrusor instability)   . GERD (gastroesophageal reflux disease)    History + Hyplori via EGD  . H/O echocardiogram    a. Echo 11/2012:EF 50-55%, focal basal septal hypertrophy with some mitral valve SAM (mild) and MR.  This may be a sigmoid septum with advanced age or could be a variant of hypertrophic cardiomyopathy.  Marland Kitchen. HTN (hypertension)   . Hyperlipidemia   . LBBB (left bundle branch block)   . Leukopenia 05/19/2014  . Lymphocytosis 05/19/2014  . Migraines   . Osteoporosis   . Prediabetes   . Vitamin D deficiency    Health Maintenance  Topic Date Due  . TETANUS/TDAP  05/08/2021  . INFLUENZA VACCINE  Completed  . DEXA SCAN  Completed  . PNA vac Low Risk Adult  Completed   Immunization History  Administered Date(s) Administered  . Influenza Split 08/06/2014, 09/20/2017  . Influenza, High Dose Seasonal PF 09/01/2015  . Pneumococcal Conjugate-13 09/01/2015  . Pneumococcal-Unspecified 10/08/2010  . Td 05/09/2011  . Zoster 05/09/2011   Last Colon - 11/11/2013 adenomatous poly- 5 yr f/u - [place] Last MGM - 8/29 and 07/14/2015 and f/u 01/12/2017 Last Dexa BMD - 07/05/2017   T= -2.7 Spine , Osteoporosis Past Surgical History:  Procedure Laterality Date  . BREAST EXCISIONAL BIOPSY Right   . BREAST SURGERY     Breast cyst  . HEMORRHOID SURGERY    . LEFT HEART CATHETERIZATION WITH CORONARY ANGIOGRAM N/A 06/11/2013   Procedure: LEFT HEART CATHETERIZATION WITH CORONARY ANGIOGRAM;  Surgeon: Kathleene Hazelhristopher D McAlhany, MD;  [facility];  Service: Cardiovascular;  Laterality: N/A;  . PACEMAKER INSERTION  06-11-2013   STJ dual chamber pacemaker  implanted by Dr Johney FrameAllred for complete heart block  . PERMANENT PACEMAKER INSERTION Left 06/11/2013   Procedure: PERMANENT PACEMAKER INSERTION;  Surgeon: Kathleene Hazelhristopher D McAlhany, MD;  [facility];  Service: Cardiovascular;  Laterality: Left;  . PERMANENT PACEMAKER INSERTION N/A 06/11/2013   Procedure: PERMANENT PACEMAKER INSERTION;  Surgeon: Hillis RangeJames Allred, MD;  [facility];  Service: Cardiovascular;  Laterality: N/A;  . TEMPORARY PACEMAKER INSERTION N/A 06/11/2013   Procedure: TEMPORARY PACEMAKER INSERTION;  Surgeon: Kathleene Hazelhristopher D McAlhany, MD;  [facility];  Service: Cardiovascular;  Laterality: N/A;  . TUBAL LIGATION     Family History  Problem Relation Age of Onset  .  Diabetes Mother   . Heart disease Mother   . Diabetes Brother   . Hypertension Brother   . Heart disease Brother   . Breast cancer Sister 77  . Aneurysm Brother   . Stroke Brother   . Cancer Father        prostate  . Diabetes Brother   . Heart disease Brother    Social History   Tobacco Use  . Smoking status: Former Smoker    Types: Cigarettes    Last attempt to quit: 10/09/1975    Years since quitting: 42.0  . Smokeless tobacco: Never Used  Substance Use Topics  . Alcohol use: No  . Drug use: No    ROS Constitutional: Denies fever, chills, weight loss/gain, headaches, insomnia,  night sweats, and change in appetite. Does c/o fatigue. Eyes: Denies redness, blurred vision, diplopia, discharge, itchy, watery eyes.  ENT: Denies discharge, congestion, post nasal drip, epistaxis, sore throat, earache, hearing loss, dental pain, Tinnitus, Vertigo, Sinus pain, snoring.  Cardio: Denies chest pain, palpitations, irregular heartbeat, syncope, dyspnea, diaphoresis, orthopnea, PND, claudication, edema Respiratory: denies cough, dyspnea, DOE, pleurisy, hoarseness, laryngitis, wheezing.  Gastrointestinal: Denies dysphagia, heartburn, reflux, water brash, pain, cramps, nausea, vomiting, bloating,  diarrhea, constipation, hematemesis, melena, hematochezia, jaundice, hemorrhoids Genitourinary: Denies dysuria, frequency, urgency, nocturia, hesitancy, discharge, hematuria, flank pain Breast: Breast lumps, nipple discharge, bleeding.  Musculoskeletal: Denies arthralgia, myalgia, stiffness, Jt. Swelling, pain, limp, and strain/sprain. Denies falls. Skin: Denies puritis, rash, hives, warts, acne, eczema, changing in skin lesion Neuro: No weakness, tremor, incoordination, spasms, paresthesia, pain Psychiatric: Denies confusion, memory loss, sensory loss. Denies Depression. Endocrine: Denies change in weight, skin, hair change, nocturia, and paresthesia, diabetic polys, visual blurring, hyper / hypo glycemic episodes.  Heme/Lymph: No excessive bleeding, bruising, enlarged lymph nodes.  Physical Exam  BP 116/80   Pulse 64   Temp (!) 97.4 F (36.3 C)   Resp 16   Ht 5' 4.25" (1.632 m)   Wt 157 lb 6.4 oz (71.4 kg)   BMI 26.81 kg/m   General Appearance: Well nourished, well groomed and in no apparent distress.  Eyes: PERRLA, EOMs, conjunctiva no swelling or erythema, normal fundi and vessels. Sinuses: No frontal/maxillary tenderness ENT/Mouth: EACs patent / TMs  nl. Nares clear without erythema, swelling, mucoid exudates. Oral hygiene is good. No erythema, swelling, or exudate. Tongue normal, non-obstructing. Tonsils not swollen or erythematous. Hearing normal.  Neck: Supple, thyroid normal. No bruits, nodes or JVD. Respiratory: Respiratory effort normal.  BS equal and clear bilateral without rales, rhonci, wheezing or stridor. Cardio: Heart sounds are normal with regular rate and rhythm and no murmurs, rubs or gallops. Peripheral pulses are normal and equal bilaterally without edema. No aortic or femoral bruits. Chest: symmetric with normal excursions and percussion. Breasts: Symmetric, without lumps, nipple discharge, retractions, or fibrocystic changes.  Abdomen: Flat, soft with bowel  sounds active. Nontender, no guarding, rebound, hernias, masses, or organomegaly.  Lymphatics: Non tender without lymphadenopathy.  Musculoskeletal: Full ROM all peripheral extremities, joint stability, 5/5 strength, and normal gait. Skin: Warm and dry without rashes, lesions, cyanosis, clubbing or  ecchymosis.  Neuro: Cranial nerves intact, reflexes equal bilaterally. Normal muscle tone, no cerebellar symptoms. Sensation intact.  Pysch: Alert and oriented X 3, normal affect, Insight and Judgment appropriate.   Assessment and Plan  1. Annual Preventative Screening Examination  2. Essential hypertension  - EKG 12-Lead - Urinalysis, Routine w reflex microscopic - Microalbumin / creatinine urine ratio - CBC with Differential/Platelet - BASIC METABOLIC  PANEL WITH GFR - Magnesium - TSH  3. Hyperlipidemia, mixed  - EKG 12-Lead - Hepatic function panel - Lipid panel - TSH  4. Prediabetes  - EKG 12-Lead - Hemoglobin A1c - Insulin, random  5. Vitamin D deficiency  - VITAMIN D 25 Hydroxy   6. Abnormal glucose  - Hemoglobin A1c - Insulin, random  7. Gastroesophageal reflux disease  - CBC with Differential/Platelet  8. Depression, controlled   9. Smoker  - EKG 12-Lead  10. CHB (complete heart block) (HCC)  - EKG 12-Lead  11. Pacemaker  - EKG 12-Lead  12. Hypertrophic obstructive cardiomyopathy (HCC)   13. Screening for colorectal cancer  - POC Hemoccult Bld/Stl (3-Cd Home Screen); Future  14. Screening for ischemic heart disease  - EKG 12-Lead  15. Medication management  - Urinalysis, Routine w reflex microscopic - Microalbumin / creatinine urine ratio - CBC with Differential/Platelet - BASIC METABOLIC PANEL WITH GFR - Hepatic function panel - Magnesium - Lipid panel - TSH - Hemoglobin A1c - Insulin, random - VITAMIN D 25 Hydroxy      Patient was counseled in prudent diet to achieve/maintain BMI less than 25 for weight control, BP monitoring,  regular exercise and medications. Discussed med's effects and SE's. Screening labs and tests as requested with regular follow-up as recommended. Over 40 minutes of exam, counseling, chart review and high complex critical decision making was performed.

## 2017-10-22 NOTE — Patient Instructions (Signed)

## 2017-10-23 ENCOUNTER — Ambulatory Visit: Payer: Medicare PPO | Admitting: Internal Medicine

## 2017-10-23 ENCOUNTER — Encounter: Payer: Self-pay | Admitting: Internal Medicine

## 2017-10-23 VITALS — BP 116/80 | HR 64 | Temp 97.4°F | Resp 16 | Ht 64.25 in | Wt 157.4 lb

## 2017-10-23 DIAGNOSIS — R7309 Other abnormal glucose: Secondary | ICD-10-CM | POA: Diagnosis not present

## 2017-10-23 DIAGNOSIS — Z79899 Other long term (current) drug therapy: Secondary | ICD-10-CM

## 2017-10-23 DIAGNOSIS — Z1211 Encounter for screening for malignant neoplasm of colon: Secondary | ICD-10-CM

## 2017-10-23 DIAGNOSIS — I442 Atrioventricular block, complete: Secondary | ICD-10-CM

## 2017-10-23 DIAGNOSIS — E782 Mixed hyperlipidemia: Secondary | ICD-10-CM

## 2017-10-23 DIAGNOSIS — Z0000 Encounter for general adult medical examination without abnormal findings: Secondary | ICD-10-CM | POA: Diagnosis not present

## 2017-10-23 DIAGNOSIS — I421 Obstructive hypertrophic cardiomyopathy: Secondary | ICD-10-CM

## 2017-10-23 DIAGNOSIS — E559 Vitamin D deficiency, unspecified: Secondary | ICD-10-CM | POA: Diagnosis not present

## 2017-10-23 DIAGNOSIS — I10 Essential (primary) hypertension: Secondary | ICD-10-CM | POA: Diagnosis not present

## 2017-10-23 DIAGNOSIS — F32A Depression, unspecified: Secondary | ICD-10-CM

## 2017-10-23 DIAGNOSIS — Z0001 Encounter for general adult medical examination with abnormal findings: Secondary | ICD-10-CM

## 2017-10-23 DIAGNOSIS — Z136 Encounter for screening for cardiovascular disorders: Secondary | ICD-10-CM | POA: Diagnosis not present

## 2017-10-23 DIAGNOSIS — K219 Gastro-esophageal reflux disease without esophagitis: Secondary | ICD-10-CM | POA: Diagnosis not present

## 2017-10-23 DIAGNOSIS — R7303 Prediabetes: Secondary | ICD-10-CM | POA: Diagnosis not present

## 2017-10-23 DIAGNOSIS — F17200 Nicotine dependence, unspecified, uncomplicated: Secondary | ICD-10-CM

## 2017-10-23 DIAGNOSIS — Z1212 Encounter for screening for malignant neoplasm of rectum: Secondary | ICD-10-CM

## 2017-10-23 DIAGNOSIS — F329 Major depressive disorder, single episode, unspecified: Secondary | ICD-10-CM

## 2017-10-23 DIAGNOSIS — Z950 Presence of cardiac pacemaker: Secondary | ICD-10-CM

## 2017-10-24 LAB — URINALYSIS, ROUTINE W REFLEX MICROSCOPIC
BILIRUBIN URINE: NEGATIVE
Bacteria, UA: NONE SEEN /HPF
Glucose, UA: NEGATIVE
Hgb urine dipstick: NEGATIVE
Hyaline Cast: NONE SEEN /LPF
KETONES UR: NEGATIVE
NITRITE: NEGATIVE
PH: 7.5 (ref 5.0–8.0)
Protein, ur: NEGATIVE
RBC / HPF: NONE SEEN /HPF (ref 0–2)
SQUAMOUS EPITHELIAL / LPF: NONE SEEN /HPF (ref <=5)
Specific Gravity, Urine: 1.005 (ref 1.001–1.03)
WBC UA: NONE SEEN /HPF (ref 0–5)

## 2017-10-24 LAB — CBC WITH DIFFERENTIAL/PLATELET
Basophils Absolute: 30 cells/uL (ref 0–200)
Basophils Relative: 0.8 %
EOS PCT: 3 %
Eosinophils Absolute: 111 cells/uL (ref 15–500)
HEMATOCRIT: 37.8 % (ref 35.0–45.0)
HEMOGLOBIN: 12.2 g/dL (ref 11.7–15.5)
LYMPHS ABS: 1739 {cells}/uL (ref 850–3900)
MCH: 27.7 pg (ref 27.0–33.0)
MCHC: 32.3 g/dL (ref 32.0–36.0)
MCV: 85.9 fL (ref 80.0–100.0)
MPV: 10.4 fL (ref 7.5–12.5)
Monocytes Relative: 9.7 %
NEUTROS ABS: 1462 {cells}/uL — AB (ref 1500–7800)
NEUTROS PCT: 39.5 %
Platelets: 209 10*3/uL (ref 140–400)
RBC: 4.4 10*6/uL (ref 3.80–5.10)
RDW: 12.5 % (ref 11.0–15.0)
Total Lymphocyte: 47 %
WBC mixed population: 359 cells/uL (ref 200–950)
WBC: 3.7 10*3/uL — AB (ref 3.8–10.8)

## 2017-10-24 LAB — HEPATIC FUNCTION PANEL
AG Ratio: 1.5 (calc) (ref 1.0–2.5)
ALBUMIN MSPROF: 3.8 g/dL (ref 3.6–5.1)
ALT: 11 U/L (ref 6–29)
AST: 15 U/L (ref 10–35)
Alkaline phosphatase (APISO): 77 U/L (ref 33–130)
BILIRUBIN TOTAL: 0.5 mg/dL (ref 0.2–1.2)
Bilirubin, Direct: 0.1 mg/dL (ref 0.0–0.2)
Globulin: 2.6 g/dL (calc) (ref 1.9–3.7)
Indirect Bilirubin: 0.4 mg/dL (calc) (ref 0.2–1.2)
TOTAL PROTEIN: 6.4 g/dL (ref 6.1–8.1)

## 2017-10-24 LAB — BASIC METABOLIC PANEL WITH GFR
BUN: 8 mg/dL (ref 7–25)
CALCIUM: 9.5 mg/dL (ref 8.6–10.4)
CO2: 31 mmol/L (ref 20–32)
Chloride: 107 mmol/L (ref 98–110)
Creat: 0.74 mg/dL (ref 0.60–0.93)
GFR, EST NON AFRICAN AMERICAN: 78 mL/min/{1.73_m2} (ref >=60)
GFR, Est African American: 91 mL/min/{1.73_m2} (ref >=60)
GLUCOSE: 82 mg/dL (ref 65–99)
POTASSIUM: 4.5 mmol/L (ref 3.5–5.3)
Sodium: 143 mmol/L (ref 135–146)

## 2017-10-24 LAB — LIPID PANEL
CHOLESTEROL: 183 mg/dL (ref <200)
HDL: 53 mg/dL (ref >50)
LDL Cholesterol (Calc): 112 mg/dL (calc) — ABNORMAL HIGH
NON-HDL CHOLESTEROL (CALC): 130 mg/dL — AB (ref <130)
TRIGLYCERIDES: 83 mg/dL (ref <150)
Total CHOL/HDL Ratio: 3.5 (calc) (ref <5.0)

## 2017-10-24 LAB — HEMOGLOBIN A1C
Hgb A1c MFr Bld: 5.7 % of total Hgb — ABNORMAL HIGH (ref <5.7)
Mean Plasma Glucose: 117 (calc)
eAG (mmol/L): 6.5 (calc)

## 2017-10-24 LAB — VITAMIN D 25 HYDROXY (VIT D DEFICIENCY, FRACTURES): VIT D 25 HYDROXY: 58 ng/mL (ref 30–100)

## 2017-10-24 LAB — TSH: TSH: 0.6 m[IU]/L (ref 0.40–4.50)

## 2017-10-24 LAB — MICROALBUMIN / CREATININE URINE RATIO
Creatinine, Urine: 26 mg/dL (ref 20–275)
MICROALB/CREAT RATIO: 8 ug/mg{creat} (ref <30)
Microalb, Ur: 0.2 mg/dL

## 2017-10-24 LAB — INSULIN, RANDOM: INSULIN: 4.3 u[IU]/mL (ref 2.0–19.6)

## 2017-10-24 LAB — MAGNESIUM: MAGNESIUM: 2.1 mg/dL (ref 1.5–2.5)

## 2017-10-27 DIAGNOSIS — M47816 Spondylosis without myelopathy or radiculopathy, lumbar region: Secondary | ICD-10-CM | POA: Diagnosis not present

## 2017-11-07 HISTORY — PX: CATARACT EXTRACTION: SUR2

## 2017-11-22 DIAGNOSIS — M47812 Spondylosis without myelopathy or radiculopathy, cervical region: Secondary | ICD-10-CM | POA: Diagnosis not present

## 2017-12-01 ENCOUNTER — Other Ambulatory Visit: Payer: Self-pay | Admitting: *Deleted

## 2017-12-01 MED ORDER — CITALOPRAM HYDROBROMIDE 20 MG PO TABS: 20.0000 mg | ORAL_TABLET | Freq: Every day | ORAL | 1 refills | Status: DC

## 2017-12-04 ENCOUNTER — Other Ambulatory Visit: Payer: Self-pay | Admitting: Internal Medicine

## 2017-12-04 MED ORDER — CITALOPRAM HYDROBROMIDE 20 MG PO TABS: 20.0000 mg | ORAL_TABLET | Freq: Every day | ORAL | 1 refills | Status: DC

## 2017-12-12 ENCOUNTER — Other Ambulatory Visit: Payer: Self-pay | Admitting: Physician Assistant

## 2017-12-12 DIAGNOSIS — I10 Essential (primary) hypertension: Secondary | ICD-10-CM

## 2018-01-03 ENCOUNTER — Encounter: Payer: Self-pay | Admitting: Internal Medicine

## 2018-01-03 ENCOUNTER — Ambulatory Visit: Payer: Medicare PPO | Admitting: Internal Medicine

## 2018-01-03 VITALS — BP 124/70 | HR 63 | Ht 64.25 in | Wt 162.0 lb

## 2018-01-03 DIAGNOSIS — I10 Essential (primary) hypertension: Secondary | ICD-10-CM | POA: Diagnosis not present

## 2018-01-03 DIAGNOSIS — I442 Atrioventricular block, complete: Secondary | ICD-10-CM

## 2018-01-03 LAB — CUP PACEART INCLINIC DEVICE CHECK
Battery Remaining Longevity: 120 mo
Battery Voltage: 2.98 V
Brady Statistic RA Percent Paced: 84 %
Brady Statistic RV Percent Paced: 99.88 %
Implantable Lead Implant Date: 20140805
Implantable Lead Model: 1944
Implantable Lead Model: 1948
Lead Channel Pacing Threshold Amplitude: 0.5 V
Lead Channel Sensing Intrinsic Amplitude: 12 mV
Lead Channel Sensing Intrinsic Amplitude: 3.2 mV
Lead Channel Setting Pacing Amplitude: 1 V
Lead Channel Setting Pacing Pulse Width: 0.4 ms
MDC IDC LEAD IMPLANT DT: 20140805
MDC IDC LEAD LOCATION: 753859
MDC IDC LEAD LOCATION: 753860
MDC IDC MSMT LEADCHNL RA IMPEDANCE VALUE: 550 Ohm
MDC IDC MSMT LEADCHNL RA PACING THRESHOLD PULSEWIDTH: 0.4 ms
MDC IDC MSMT LEADCHNL RV IMPEDANCE VALUE: 625 Ohm
MDC IDC MSMT LEADCHNL RV PACING THRESHOLD AMPLITUDE: 0.75 V
MDC IDC MSMT LEADCHNL RV PACING THRESHOLD PULSEWIDTH: 0.4 ms
MDC IDC PG IMPLANT DT: 20140805
MDC IDC PG SERIAL: 7523768
MDC IDC SESS DTM: 20190227102324
MDC IDC SET LEADCHNL RA PACING AMPLITUDE: 2 V
MDC IDC SET LEADCHNL RV SENSING SENSITIVITY: 4 mV

## 2018-01-03 NOTE — Progress Notes (Signed)
PCP: Lucky Cowboy, MD Primary Cardiologist: Shirlee Latch Primary EP:  Dr Johney Frame  Alexandra Wheeler is a 77 y.o. female who presents today for routine electrophysiology followup.  Since last being seen in our clinic, the patient reports doing very well.  Today, she denies symptoms of palpitations, chest pain, shortness of breath,  lower extremity edema, dizziness, presyncope, or syncope.  The patient is otherwise without complaint today.   Past Medical History:  Diagnosis Date  . Anxiety   . Atrophic vaginitis   . Chest pain    a. Adenosine sestamibi (1/14) with no evidence of ischemia or infarction, EF 63%.   . Colon polyps   . Complete heart block (HCC) 06-2013   status post pacemaker implantation by Dr Johney Frame 06-11-2013  . Depression   . DI (detrusor instability)   . GERD (gastroesophageal reflux disease)    History + Hyplori via EGD  . H/O echocardiogram    a. Echo 11/2012:EF 50-55%, focal basal septal hypertrophy with some mitral valve SAM (mild) and MR.  This may be a sigmoid septum with advanced age or could be a variant of hypertrophic cardiomyopathy.  Marland Kitchen HTN (hypertension)   . Hyperlipidemia   . LBBB (left bundle branch block)   . Leukopenia 05/19/2014  . Lymphocytosis 05/19/2014  . Migraines   . Osteoporosis   . Prediabetes   . Vitamin D deficiency    Past Surgical History:  Procedure Laterality Date  . BREAST EXCISIONAL BIOPSY Right   . BREAST SURGERY     Breast cyst  . HEMORRHOID SURGERY    . LEFT HEART CATHETERIZATION WITH CORONARY ANGIOGRAM N/A 06/11/2013   Procedure: LEFT HEART CATHETERIZATION WITH CORONARY ANGIOGRAM;  Surgeon: Kathleene Hazel, MD;  Location: [facility] CATH LAB;  Service: Cardiovascular;  Laterality: N/A;  . PACEMAKER INSERTION  06-11-2013   STJ dual chamber pacemaker implanted by Dr Johney Frame for complete heart block  . PERMANENT PACEMAKER INSERTION Left 06/11/2013   Procedure: PERMANENT PACEMAKER INSERTION;  Surgeon: Kathleene Hazel, MD;  Location:  [facility] CATH LAB;  Service: Cardiovascular;  Laterality: Left;  . PERMANENT PACEMAKER INSERTION N/A 06/11/2013   Procedure: PERMANENT PACEMAKER INSERTION;  Surgeon: Hillis Range, MD;  Location: [facility] CATH LAB;  Service: Cardiovascular;  Laterality: N/A;  . TEMPORARY PACEMAKER INSERTION N/A 06/11/2013   Procedure: TEMPORARY PACEMAKER INSERTION;  Surgeon: Kathleene Hazel, MD;  Location: [facility] CATH LAB;  Service: Cardiovascular;  Laterality: N/A;  . TUBAL LIGATION      ROS- all systems are reviewed and negative except as per HPI above  Current Outpatient Medications  Medication Sig Dispense Refill  . ALPRAZolam (XANAX) 1 MG tablet TAKE 1/2 TO 1 TABLET THREE TIMES DAILY AS NEEDED FOR ANXIETY 270 tablet 0  . Ascorbic Acid (VITAMIN C PO) Take 1 tablet by mouth daily.     Marland Kitchen aspirin EC 81 MG tablet Take 81 mg by mouth daily.    Marland Kitchen BIOTIN PO Take 1 tablet by mouth daily.     . Calcium Carbonate-Vitamin D (CALCIUM + D PO) Take 1 tablet by mouth daily.     . Cholecalciferol (VITAMIN D PO) Take 5,000 Units by mouth daily.     . citalopram (CELEXA) 20 MG tablet Take 1 tablet (20 mg total) by mouth daily. 90 tablet 1  . Coenzyme Q10 (CO Q 10 PO) Take 1 capsule by mouth daily.    . fluticasone (FLONASE) 50 MCG/ACT nasal spray Place 2 sprays into both nostrils daily. (Patient taking differently:  Place 2 sprays into both nostrils daily as needed for allergies or rhinitis. ) 16 g 0  . FOLIC ACID PO Take 1 tablet by mouth daily.     . hydrochlorothiazide (HYDRODIURIL) 25 MG tablet Take 25 mg by mouth daily as needed (swelling).     Marland Kitchen. MAGNESIUM PO Take 500 mg by mouth daily.     . metoprolol succinate (TOPROL-XL) 50 MG 24 hr tablet TAKE 1 TABLET EVERY DAY FOR BLOOD PRESSURE 90 tablet 1  . Multiple Vitamin (MULTIVITAMIN) tablet Take 1 tablet by mouth daily.    Marland Kitchen. PROCTOZONE-HC 2.5 % rectal cream PLACE 1 APPLICATION RECTALLY 2 (TWO) TIMES DAILY. 28.35 g 5  . ranitidine (ZANTAC) 300 MG tablet Take 300 mg by mouth daily as  needed for heartburn.    . Tretinoin, Facial Wrinkles, (TRETINOIN, EMOLLIENT,) 0.05 % CREA APPLY TO AFFECTED AREA EVERY DAY AS DIRECTED 40 g 1   No current facility-administered medications for this visit.     Physical Exam: Vitals:   01/03/18 0923  BP: 124/70  Pulse: 63  Weight: 162 lb (73.5 kg)  Height: 5' 4.25" (1.632 m)    GEN- The patient is well appearing, alert and oriented x 3 today.   Head- normocephalic, atraumatic Eyes-  Sclera clear, conjunctiva pink Ears- hearing intact Oropharynx- clear Lungs- Clear to ausculation bilaterally, normal work of breathing Chest- pacemaker pocket is well healed Heart- Regular rate and rhythm, no murmurs, rubs or gallops, PMI not laterally displaced GI- soft, NT, ND, + BS Extremities- no clubbing, cyanosis, or edema  Pacemaker interrogation- reviewed in detail today,  See PACEART report    Assessment and Plan:  1. Symptomatic complete heart block Normal pacemaker function See Pace Art report 40 PMT episodes are noted.  PMT induced today.  She has VA 461.  84% A paced, 100% V paced Will therefore program DDIR with lower rate of 70 bpm to promote AV synchrony.  2. HTN Stable No change required today  Merlin Return to see EP NP every year  Hillis RangeJames Oron Westrup MD, [facility]ACC 01/03/2018 10:00 AM

## 2018-01-03 NOTE — Patient Instructions (Signed)
Medication Instructions:  Your physician recommends that you continue on your current medications as directed. Please refer to the Current Medication list given to you today.   Labwork: None ordered  Testing/Procedures: None ordered  Follow-Up: Remote monitoring is used to monitor your Pacemaker from home. This monitoring reduces the number of office visits required to check your device to one time per year. It allows us to keep an eye on the functioning of your device to ensure it is working properly. You are scheduled for a device check from home on 01/10/18. You may send your transmission at any time that day. If you have a wireless device, the transmission will be sent automatically. After your physician reviews your transmission, you will receive a postcard with your next transmission date.    Your physician wants you to follow-up in: 1 year with Gypsy BalsamAmber Seiler, NP. You will receive a reminder letter in the mail two months in advance. If you don't receive a letter, please call our office to schedule the follow-up appointment.   Any Other Special Instructions Will Be Listed Below (If Applicable).     If you need a refill on your cardiac medications before your next appointment, please call your pharmacy.

## 2018-01-08 ENCOUNTER — Ambulatory Visit (INDEPENDENT_AMBULATORY_CARE_PROVIDER_SITE_OTHER): Payer: Self-pay | Admitting: *Deleted

## 2018-01-08 DIAGNOSIS — I442 Atrioventricular block, complete: Secondary | ICD-10-CM

## 2018-01-10 ENCOUNTER — Ambulatory Visit (INDEPENDENT_AMBULATORY_CARE_PROVIDER_SITE_OTHER): Payer: Medicare PPO | Admitting: *Deleted

## 2018-01-10 DIAGNOSIS — I442 Atrioventricular block, complete: Secondary | ICD-10-CM | POA: Diagnosis not present

## 2018-01-10 NOTE — Progress Notes (Signed)
Remote pacemaker transmission.   

## 2018-01-11 ENCOUNTER — Encounter: Payer: Self-pay | Admitting: Cardiology

## 2018-01-11 LAB — CUP PACEART INCLINIC DEVICE CHECK
Date Time Interrogation Session: 20190307074421
Implantable Lead Location: 753860
Implantable Lead Model: 1948
Implantable Pulse Generator Implant Date: 20140805
Lead Channel Setting Pacing Amplitude: 2 V
Lead Channel Setting Sensing Sensitivity: 4 mV
MDC IDC LEAD IMPLANT DT: 20140805
MDC IDC LEAD IMPLANT DT: 20140805
MDC IDC LEAD LOCATION: 753859
MDC IDC SET LEADCHNL RV PACING AMPLITUDE: 1.125
MDC IDC SET LEADCHNL RV PACING PULSEWIDTH: 0.4 ms
Pulse Gen Serial Number: 7523768

## 2018-01-11 NOTE — Progress Notes (Signed)
Pt seen d/t not feeling right and having palpitations since reprogramming on 01/03/18. Pt reprogrammed back to DDDR with base rate of 60bpm. F/U as scheduled with remote 04/11/18 and JA in 12 months.

## 2018-01-12 ENCOUNTER — Other Ambulatory Visit: Payer: Medicare PPO

## 2018-01-15 ENCOUNTER — Ambulatory Visit
Admission: RE | Admit: 2018-01-15 | Discharge: 2018-01-15 | Disposition: A | Discharge status: Home / Self Care | Payer: Medicare PPO | Source: Ambulatory Visit | Attending: Internal Medicine | Admitting: Internal Medicine

## 2018-01-15 ENCOUNTER — Other Ambulatory Visit: Payer: Self-pay | Admitting: Internal Medicine

## 2018-01-15 DIAGNOSIS — N63 Unspecified lump in unspecified breast: Secondary | ICD-10-CM

## 2018-01-15 DIAGNOSIS — N6321 Unspecified lump in the left breast, upper outer quadrant: Secondary | ICD-10-CM | POA: Diagnosis not present

## 2018-01-15 DIAGNOSIS — R922 Inconclusive mammogram: Secondary | ICD-10-CM | POA: Diagnosis not present

## 2018-01-15 DIAGNOSIS — N6320 Unspecified lump in the left breast, unspecified quadrant: Secondary | ICD-10-CM

## 2018-01-15 DIAGNOSIS — N6489 Other specified disorders of breast: Secondary | ICD-10-CM

## 2018-01-15 DIAGNOSIS — Z1231 Encounter for screening mammogram for malignant neoplasm of breast: Secondary | ICD-10-CM

## 2018-01-15 DIAGNOSIS — N6322 Unspecified lump in the left breast, upper inner quadrant: Secondary | ICD-10-CM | POA: Diagnosis not present

## 2018-01-19 LAB — CUP PACEART REMOTE DEVICE CHECK
Battery Remaining Longevity: 116 mo
Brady Statistic AP VS Percent: 1 %
Brady Statistic AS VP Percent: 13 %
Brady Statistic RA Percent Paced: 86 %
Date Time Interrogation Session: 20190304194251
Implantable Lead Implant Date: 20140805
Implantable Lead Location: 753859
Implantable Lead Location: 753860
Implantable Lead Model: 1944
Implantable Lead Model: 1948
Lead Channel Impedance Value: 540 Ohm
Lead Channel Pacing Threshold Amplitude: 0.875 V
Lead Channel Pacing Threshold Pulse Width: 0.4 ms
Lead Channel Pacing Threshold Pulse Width: 0.4 ms
Lead Channel Sensing Intrinsic Amplitude: 12 mV
Lead Channel Sensing Intrinsic Amplitude: 2.8 mV
Lead Channel Setting Pacing Amplitude: 1.125
Lead Channel Setting Pacing Amplitude: 2 V
Lead Channel Setting Pacing Pulse Width: 0.4 ms
MDC IDC LEAD IMPLANT DT: 20140805
MDC IDC MSMT BATTERY REMAINING PERCENTAGE: 95.5 %
MDC IDC MSMT BATTERY VOLTAGE: 2.98 V
MDC IDC MSMT LEADCHNL RA PACING THRESHOLD AMPLITUDE: 0.5 V
MDC IDC MSMT LEADCHNL RV IMPEDANCE VALUE: 610 Ohm
MDC IDC PG IMPLANT DT: 20140805
MDC IDC SET LEADCHNL RV SENSING SENSITIVITY: 4 mV
MDC IDC STAT BRADY AP VP PERCENT: 86 %
MDC IDC STAT BRADY AS VS PERCENT: 1 %
MDC IDC STAT BRADY RV PERCENT PACED: 99 %
Pulse Gen Model: 2240
Pulse Gen Serial Number: 7523768

## 2018-01-31 ENCOUNTER — Ambulatory Visit: Payer: Self-pay | Admitting: Adult Health

## 2018-02-05 NOTE — Progress Notes (Signed)
Subjective:    Patient ID: Alexandra Wheeler, female    DOB: [birth year], 77 y.o.   MRN: [id number]  HPI  This delightful 77 yo WWF presents with a 2 week prodrome of intermittent & un relenting positional vertigo usu worse in the morning and lessening as the day progresses. Denies associated N/V, other respiratory or Neurologic sx's.   Medication Sig  . ALPRAZolam  1 MG tablet TAKE 1/2 TO 1 TABLET THREE TIMES DAILY AS NEEDED FOR ANXIETY  . VITAMIN C P Take 1 tablet by mouth daily.   Marland Kitchen. aspirin EC 81 MG tablet Take 81 mg by mouth daily.  Marland Kitchen. BIOTIN PO Take 1 tablet by mouth daily.   Marland Kitchen. CALCIUM + D Take 1 tablet by mouth daily.   Marland Kitchen. VITAMIN D  Take 5,000 Units by mouth daily.   . citalopram 20 MG tablet Take 1 tablet (20 mg total) by mouth daily.  . Coenzyme Q10  Take 1 capsule by mouth daily.  . fluticasone  nasal spray Place 2 sprays into both nostrils daily. (Patient taking differently: Place 2 sprays into both nostrils daily as needed for allergies or rhinitis. )  . FOLIC ACID  Take 1 tablet by mouth daily.   . hctz 25 MG tablet Take 25 mg by mouth daily as needed (swelling).   Marland Kitchen. MAGNESIUM Take 500 mg by mouth daily.   . metoprolol succ-XL 50 MG TAKE 1 TABLET EVERY DAY FOR BLOOD PRESSURE  . Multiple Vitamin  Take 1 tablet by mouth daily.  Marland Kitchen. PROCTOZONE-HC 2.5 %  PLACE 1 APPLICATION RECTALLY 2 (TWO) TIMES DAILY.  . ranitidine  300 MG tablet Take 300 mg by mouth daily as needed for heartburn.  . TRETINOIN 0.05 % CREA APPLY TO AFFECTED AREA EVERY DAY AS DIRECTED   Allergies  Allergen Reactions  . Actonel [Risedronate Sodium]     Pt didn't like the way it made her feel   . Boniva [Ibandronic Acid]     Pt didn't like the way it made her feel  . Evista [Raloxifene]     Pt didn't like the way it made her feel  . Fosamax [Alendronate Sodium]     Pt didn't like the way it made her feel   . Levsin [Hyoscyamine Sulfate]     unknown  . Mobic [Meloxicam]     unknown   . Topamax [Topiramate]      unknown   . Zoloft [Sertraline Hcl]     unknown    Past Medical History:  Diagnosis Date  . Anxiety   . Atrophic vaginitis   . Chest pain    a. Adenosine sestamibi (1/14) with no evidence of ischemia or infarction, EF 63%.   . Colon polyps   . Complete heart block (HCC) 06-2013   status post pacemaker implantation by Dr Johney FrameAllred 06-11-2013  . Depression   . DI (detrusor instability)   . GERD (gastroesophageal reflux disease)    History + Hyplori via EGD  . H/O echocardiogram    a. Echo 11/2012:EF 50-55%, focal basal septal hypertrophy with some mitral valve SAM (mild) and MR.  This may be a sigmoid septum with advanced age or could be a variant of hypertrophic cardiomyopathy.  Marland Kitchen. HTN (hypertension)   . Hyperlipidemia   . LBBB (left bundle branch block)   . Leukopenia 05/19/2014  . Lymphocytosis 05/19/2014  . Migraines   . Osteoporosis   . Prediabetes   . Vitamin D deficiency    Past  Surgical History:  Procedure Laterality Date  . BREAST EXCISIONAL BIOPSY Right   . BREAST SURGERY     Breast cyst  . HEMORRHOID SURGERY    . LEFT HEART CATHETERIZATION WITH CORONARY ANGIOGRAM N/A 06/11/2013   Procedure: LEFT HEART CATHETERIZATION WITH CORONARY ANGIOGRAM;  Surgeon: Kathleene Hazel, MD;  Location[facility] CATH LAB;  Service: Cardiovascular;  Laterality: N/A;  . PACEMAKER INSERTION  06-11-2013   STJ dual chamber pacemaker implanted by Dr Johney Frame for complete heart block  . PERMANENT PACEMAKER INSERTION Left 06/11/2013   Procedure: PERMANENT PACEMAKER INSERTION;  Surgeon: Kathleene Hazel, MD;  Location: [facility] CATH LAB;  Service: Cardiovascular;  Laterality: Left;  . PERMANENT PACEMAKER INSERTION N/A 06/11/2013   Procedure: PERMANENT PACEMAKER INSERTION;  Surgeon: Hillis Range, MD;  Location: [facility] CATH LAB;  Service: Cardiovascular;  Laterality: N/A;  . TEMPORARY PACEMAKER INSERTION N/A 06/11/2013   Procedure: TEMPORARY PACEMAKER INSERTION;  Surgeon: Kathleene Hazel, MD;  Location: [facility]  CATH LAB;  Service: Cardiovascular;  Laterality: N/A;  . TUBAL LIGATION     Review of Systems  10 point systems review negative except as above.    Objective:   Physical Exam  BP 118/76   Pulse 64   Temp (!) 97.1 F (36.2 C)   Resp 16   Ht 5' 4.5" (1.638 m)   Wt 160 lb (72.6 kg)   BMI 27.04 kg/m   HEENT - WNL. Neck - supple.  Chest - Clear equal BS. Cor - Nl HS. RRR w/o sig MGR. PP 1(+). No edema. MS- FROM w/o deformities.  Gait Nl. Neuro -  Nl w/o focal abnormalities.    Assessment & Plan:   1. Vertigo  - predniSONE 20 MG tablet; 1 tab 3 x day for 3 days, then 1 tab 2 x day for 3 days, then 1 tab 1 x day for 5 days  Dispense: 20 tablet  - meclizine  25 MG tablet; Take 1 tablet (25 mg total) by mouth 3 (three) times daily as needed for dizziness.  Dispense: 90 tablet

## 2018-02-06 ENCOUNTER — Encounter: Payer: Self-pay | Admitting: Internal Medicine

## 2018-02-06 ENCOUNTER — Ambulatory Visit: Payer: Medicare PPO | Admitting: Internal Medicine

## 2018-02-06 VITALS — BP 118/76 | HR 64 | Temp 97.1°F | Resp 16 | Ht 64.5 in | Wt 160.0 lb

## 2018-02-06 DIAGNOSIS — R42 Dizziness and giddiness: Secondary | ICD-10-CM | POA: Diagnosis not present

## 2018-02-06 MED ORDER — PREDNISONE 20 MG PO TABS: ORAL_TABLET | ORAL | 0 refills | Status: DC

## 2018-02-06 MED ORDER — MECLIZINE HCL 25 MG PO TABS: 25.0000 mg | ORAL_TABLET | Freq: Three times a day (TID) | ORAL | 0 refills | Status: DC | PRN

## 2018-02-06 NOTE — Patient Instructions (Signed)

## 2018-04-11 ENCOUNTER — Ambulatory Visit (INDEPENDENT_AMBULATORY_CARE_PROVIDER_SITE_OTHER): Payer: Medicare PPO | Admitting: *Deleted

## 2018-04-11 DIAGNOSIS — I442 Atrioventricular block, complete: Secondary | ICD-10-CM

## 2018-04-11 NOTE — Progress Notes (Signed)
Remote pacemaker transmission.   

## 2018-05-01 ENCOUNTER — Other Ambulatory Visit: Payer: Self-pay | Admitting: Internal Medicine

## 2018-05-03 ENCOUNTER — Other Ambulatory Visit: Payer: Self-pay | Admitting: Internal Medicine

## 2018-05-16 ENCOUNTER — Ambulatory Visit: Payer: Self-pay | Admitting: Internal Medicine

## 2018-05-16 ENCOUNTER — Other Ambulatory Visit: Payer: Self-pay | Admitting: *Deleted

## 2018-05-16 MED ORDER — HYDROCORTISONE 2.5 % RE CREA
TOPICAL_CREAM | RECTAL | 5 refills | Status: DC
Start: 2018-05-16 — End: 2021-08-10

## 2018-05-16 MED ORDER — TRETINOIN (EMOLLIENT) 0.05 % EX CREA
TOPICAL_CREAM | CUTANEOUS | 1 refills | Status: DC
Start: 2018-05-16 — End: 2019-09-03

## 2018-05-31 LAB — CUP PACEART REMOTE DEVICE CHECK
Battery Remaining Longevity: 116 mo
Battery Remaining Percentage: 95.5 %
Battery Voltage: 2.96 V
Brady Statistic AP VS Percent: 1 %
Brady Statistic RA Percent Paced: 72 %
Brady Statistic RV Percent Paced: 99 %
Implantable Lead Implant Date: 20140805
Implantable Lead Location: 753860
Implantable Lead Model: 1944
Implantable Lead Model: 1948
Implantable Pulse Generator Implant Date: 20140805
Lead Channel Impedance Value: 510 Ohm
Lead Channel Pacing Threshold Amplitude: 0.5 V
Lead Channel Pacing Threshold Amplitude: 0.75 V
Lead Channel Pacing Threshold Pulse Width: 0.4 ms
Lead Channel Sensing Intrinsic Amplitude: 2.5 mV
Lead Channel Setting Sensing Sensitivity: 4 mV
MDC IDC LEAD IMPLANT DT: 20140805
MDC IDC LEAD LOCATION: 753859
MDC IDC MSMT LEADCHNL RV IMPEDANCE VALUE: 600 Ohm
MDC IDC MSMT LEADCHNL RV PACING THRESHOLD PULSEWIDTH: 0.4 ms
MDC IDC MSMT LEADCHNL RV SENSING INTR AMPL: 12 mV
MDC IDC PG SERIAL: 7523768
MDC IDC SESS DTM: 20190605060024
MDC IDC SET LEADCHNL RA PACING AMPLITUDE: 2 V
MDC IDC SET LEADCHNL RV PACING AMPLITUDE: 1 V
MDC IDC SET LEADCHNL RV PACING PULSEWIDTH: 0.4 ms
MDC IDC STAT BRADY AP VP PERCENT: 73 %
MDC IDC STAT BRADY AS VP PERCENT: 27 %
MDC IDC STAT BRADY AS VS PERCENT: 1 %

## 2018-06-18 ENCOUNTER — Other Ambulatory Visit: Payer: Self-pay | Admitting: Internal Medicine

## 2018-07-11 ENCOUNTER — Ambulatory Visit (INDEPENDENT_AMBULATORY_CARE_PROVIDER_SITE_OTHER): Payer: Medicare PPO | Admitting: *Deleted

## 2018-07-11 DIAGNOSIS — I442 Atrioventricular block, complete: Secondary | ICD-10-CM | POA: Diagnosis not present

## 2018-07-11 DIAGNOSIS — I4891 Unspecified atrial fibrillation: Secondary | ICD-10-CM

## 2018-07-11 NOTE — Progress Notes (Signed)
Remote pacemaker transmission.   

## 2018-07-19 ENCOUNTER — Other Ambulatory Visit: Payer: Self-pay | Admitting: Internal Medicine

## 2018-07-19 DIAGNOSIS — N63 Unspecified lump in unspecified breast: Secondary | ICD-10-CM

## 2018-07-23 ENCOUNTER — Other Ambulatory Visit: Payer: Medicare PPO

## 2018-07-23 ENCOUNTER — Ambulatory Visit
Admission: RE | Admit: 2018-07-23 | Discharge: 2018-07-23 | Disposition: A | Discharge status: Home / Self Care | Payer: Medicare PPO | Source: Ambulatory Visit | Attending: Internal Medicine | Admitting: Internal Medicine

## 2018-07-23 DIAGNOSIS — N6489 Other specified disorders of breast: Secondary | ICD-10-CM | POA: Diagnosis not present

## 2018-07-23 DIAGNOSIS — N6320 Unspecified lump in the left breast, unspecified quadrant: Secondary | ICD-10-CM

## 2018-07-23 DIAGNOSIS — N63 Unspecified lump in unspecified breast: Secondary | ICD-10-CM

## 2018-07-23 DIAGNOSIS — R922 Inconclusive mammogram: Secondary | ICD-10-CM | POA: Diagnosis not present

## 2018-08-07 LAB — CUP PACEART REMOTE DEVICE CHECK
Battery Remaining Longevity: 116 mo
Brady Statistic AP VP Percent: 67 %
Brady Statistic AP VS Percent: 1 %
Brady Statistic AS VP Percent: 32 %
Implantable Lead Implant Date: 20140805
Implantable Lead Model: 1948
Lead Channel Impedance Value: 530 Ohm
Lead Channel Pacing Threshold Amplitude: 0.875 V
Lead Channel Sensing Intrinsic Amplitude: 12 mV
Lead Channel Setting Pacing Amplitude: 2 V
Lead Channel Setting Pacing Pulse Width: 0.4 ms
MDC IDC LEAD IMPLANT DT: 20140805
MDC IDC LEAD LOCATION: 753859
MDC IDC LEAD LOCATION: 753860
MDC IDC MSMT BATTERY REMAINING PERCENTAGE: 95.5 %
MDC IDC MSMT BATTERY VOLTAGE: 2.96 V
MDC IDC MSMT LEADCHNL RA PACING THRESHOLD AMPLITUDE: 0.5 V
MDC IDC MSMT LEADCHNL RA PACING THRESHOLD PULSEWIDTH: 0.4 ms
MDC IDC MSMT LEADCHNL RA SENSING INTR AMPL: 2.5 mV
MDC IDC MSMT LEADCHNL RV IMPEDANCE VALUE: 600 Ohm
MDC IDC MSMT LEADCHNL RV PACING THRESHOLD PULSEWIDTH: 0.4 ms
MDC IDC PG IMPLANT DT: 20140805
MDC IDC SESS DTM: 20190904060015
MDC IDC SET LEADCHNL RV PACING AMPLITUDE: 1.125
MDC IDC SET LEADCHNL RV SENSING SENSITIVITY: 4 mV
MDC IDC STAT BRADY AS VS PERCENT: 1 %
MDC IDC STAT BRADY RA PERCENT PACED: 67 %
MDC IDC STAT BRADY RV PERCENT PACED: 99 %
Pulse Gen Serial Number: 7523768

## 2018-08-16 ENCOUNTER — Telehealth: Payer: Self-pay | Admitting: Internal Medicine

## 2018-08-16 NOTE — Telephone Encounter (Signed)
patient requests referral for eye exam. Patient states had eye exam at work, [employer], 1 yr ago, told she had cataracts. Patient requests referral for eye exam with Dr Dione Booze.

## 2018-08-22 ENCOUNTER — Other Ambulatory Visit: Payer: Self-pay | Admitting: Internal Medicine

## 2018-09-19 DIAGNOSIS — H25813 Combined forms of age-related cataract, bilateral: Secondary | ICD-10-CM | POA: Diagnosis not present

## 2018-09-19 DIAGNOSIS — H40013 Open angle with borderline findings, low risk, bilateral: Secondary | ICD-10-CM | POA: Diagnosis not present

## 2018-09-19 LAB — HM DIABETES EYE EXAM

## 2018-10-01 DIAGNOSIS — H25812 Combined forms of age-related cataract, left eye: Secondary | ICD-10-CM | POA: Diagnosis not present

## 2018-10-01 DIAGNOSIS — H2512 Age-related nuclear cataract, left eye: Secondary | ICD-10-CM | POA: Diagnosis not present

## 2018-10-08 ENCOUNTER — Telehealth: Payer: Self-pay

## 2018-10-08 NOTE — Telephone Encounter (Signed)
Spoke with pt informed her of that her monitor had transmitted an episode of AF > 24hrs informed pt that this could put her an increased risk of stroke pt agreeable to apt with AF clinic on 10/15/2018 at 2:00pm to discuss OAC.

## 2018-10-10 ENCOUNTER — Ambulatory Visit (INDEPENDENT_AMBULATORY_CARE_PROVIDER_SITE_OTHER): Payer: Medicare PPO

## 2018-10-10 DIAGNOSIS — I442 Atrioventricular block, complete: Secondary | ICD-10-CM | POA: Diagnosis not present

## 2018-10-10 NOTE — Progress Notes (Signed)
Remote pacemaker transmission.   

## 2018-10-11 ENCOUNTER — Encounter: Payer: Self-pay | Admitting: *Deleted

## 2018-10-15 ENCOUNTER — Encounter (HOSPITAL_COMMUNITY): Payer: Self-pay | Admitting: Nurse Practitioner

## 2018-10-15 ENCOUNTER — Ambulatory Visit (HOSPITAL_COMMUNITY)
Admission: RE | Admit: 2018-10-15 | Discharge: 2018-10-15 | Disposition: A | Discharge status: Home / Self Care | Payer: Medicare PPO | Source: Ambulatory Visit | Attending: Nurse Practitioner | Admitting: Nurse Practitioner

## 2018-10-15 VITALS — BP 98/68 | HR 63 | Ht 64.5 in | Wt 165.0 lb

## 2018-10-15 DIAGNOSIS — I4891 Unspecified atrial fibrillation: Secondary | ICD-10-CM | POA: Insufficient documentation

## 2018-10-15 DIAGNOSIS — E559 Vitamin D deficiency, unspecified: Secondary | ICD-10-CM | POA: Diagnosis not present

## 2018-10-15 DIAGNOSIS — K219 Gastro-esophageal reflux disease without esophagitis: Secondary | ICD-10-CM | POA: Insufficient documentation

## 2018-10-15 DIAGNOSIS — Z7901 Long term (current) use of anticoagulants: Secondary | ICD-10-CM | POA: Insufficient documentation

## 2018-10-15 DIAGNOSIS — I447 Left bundle-branch block, unspecified: Secondary | ICD-10-CM | POA: Insufficient documentation

## 2018-10-15 DIAGNOSIS — I480 Paroxysmal atrial fibrillation: Secondary | ICD-10-CM | POA: Diagnosis not present

## 2018-10-15 DIAGNOSIS — F419 Anxiety disorder, unspecified: Secondary | ICD-10-CM | POA: Insufficient documentation

## 2018-10-15 DIAGNOSIS — Z950 Presence of cardiac pacemaker: Secondary | ICD-10-CM | POA: Diagnosis not present

## 2018-10-15 DIAGNOSIS — Z79899 Other long term (current) drug therapy: Secondary | ICD-10-CM | POA: Insufficient documentation

## 2018-10-15 DIAGNOSIS — I10 Essential (primary) hypertension: Secondary | ICD-10-CM | POA: Diagnosis not present

## 2018-10-15 DIAGNOSIS — Z888 Allergy status to other drugs, medicaments and biological substances status: Secondary | ICD-10-CM | POA: Insufficient documentation

## 2018-10-15 DIAGNOSIS — E785 Hyperlipidemia, unspecified: Secondary | ICD-10-CM | POA: Insufficient documentation

## 2018-10-15 DIAGNOSIS — Z8249 Family history of ischemic heart disease and other diseases of the circulatory system: Secondary | ICD-10-CM | POA: Diagnosis not present

## 2018-10-15 DIAGNOSIS — Z87891 Personal history of nicotine dependence: Secondary | ICD-10-CM | POA: Insufficient documentation

## 2018-10-15 DIAGNOSIS — R7303 Prediabetes: Secondary | ICD-10-CM | POA: Diagnosis not present

## 2018-10-15 LAB — BASIC METABOLIC PANEL
Anion gap: 11 (ref 5–15)
BUN: 12 mg/dL (ref 8–23)
CO2: 25 mmol/L (ref 22–32)
CREATININE: 0.83 mg/dL (ref 0.44–1.00)
Calcium: 9.1 mg/dL (ref 8.9–10.3)
Chloride: 104 mmol/L (ref 98–111)
Glucose, Bld: 103 mg/dL — ABNORMAL HIGH (ref 70–99)
POTASSIUM: 4.1 mmol/L (ref 3.5–5.1)
Sodium: 140 mmol/L (ref 135–145)

## 2018-10-15 LAB — CBC
HCT: 42.9 % (ref 36.0–46.0)
Hemoglobin: 13.5 g/dL (ref 12.0–15.0)
MCH: 28.5 pg (ref 26.0–34.0)
MCHC: 31.5 g/dL (ref 30.0–36.0)
MCV: 90.5 fL (ref 80.0–100.0)
PLATELETS: 245 10*3/uL (ref 150–400)
RBC: 4.74 MIL/uL (ref 3.87–5.11)
RDW: 13.4 % (ref 11.5–15.5)
WBC: 4 10*3/uL (ref 4.0–10.5)
nRBC: 0 % (ref 0.0–0.2)

## 2018-10-15 MED ORDER — APIXABAN 5 MG PO TABS: 5.0000 mg | ORAL_TABLET | Freq: Two times a day (BID) | ORAL | 0 refills | Status: DC

## 2018-10-15 NOTE — Progress Notes (Signed)
Primary Care Physician: Lucky CowboyMcKeown, William, MD Referring Physician: Dr. Dollene ClevelandAllred   Alexandra Wheeler is a 77 y.o. female with a h/o PPM, HTN, LBBB, pre diabetes, that is in the afib clinic for  afib newly found on her device. She was unaware of the day she was in afib. She is on daily metoprolol.   Today, she denies symptoms of palpitations, chest pain, shortness of breath, orthopnea, PND, lower extremity edema, dizziness, presyncope, syncope, or neurologic sequela. The patient is tolerating medications without difficulties and is otherwise without complaint today.   Past Medical History:  Diagnosis Date  . Anxiety   . Atrophic vaginitis   . Chest pain    a. Adenosine sestamibi (1/14) with no evidence of ischemia or infarction, EF 63%.   . Colon polyps   . Complete heart block (HCC) 06-2013   status post pacemaker implantation by Dr Johney FrameAllred 06-11-2013  . Depression   . DI (detrusor instability)   . GERD (gastroesophageal reflux disease)    History + Hyplori via EGD  . H/O echocardiogram    a. Echo 11/2012:EF 50-55%, focal basal septal hypertrophy with some mitral valve SAM (mild) and MR.  This may be a sigmoid septum with advanced age or could be a variant of hypertrophic cardiomyopathy.  Marland Kitchen. HTN (hypertension)   . Hyperlipidemia   . LBBB (left bundle branch block)   . Leukopenia 05/19/2014  . Lymphocytosis 05/19/2014  . Migraines   . Osteoporosis   . Prediabetes   . Vitamin D deficiency    Past Surgical History:  Procedure Laterality Date  . BREAST EXCISIONAL BIOPSY Right   . BREAST SURGERY     Breast cyst  . HEMORRHOID SURGERY    . LEFT HEART CATHETERIZATION WITH CORONARY ANGIOGRAM N/A 06/11/2013   Procedure: LEFT HEART CATHETERIZATION WITH CORONARY ANGIOGRAM;  Surgeon: Kathleene Hazelhristopher D McAlhany, MD;  [facility];  Service: Cardiovascular;  Laterality: N/A;  . PACEMAKER INSERTION  06-11-2013   STJ dual chamber pacemaker implanted by Dr Johney FrameAllred for complete heart block  .  PERMANENT PACEMAKER INSERTION Left 06/11/2013   Procedure: PERMANENT PACEMAKER INSERTION;  Surgeon: Kathleene Hazelhristopher D McAlhany, MD;  [facility];  Service: Cardiovascular;  Laterality: Left;  . PERMANENT PACEMAKER INSERTION N/A 06/11/2013   Procedure: PERMANENT PACEMAKER INSERTION;  Surgeon: Hillis RangeJames Allred, MD;  [facility];  Service: Cardiovascular;  Laterality: N/A;  . TEMPORARY PACEMAKER INSERTION N/A 06/11/2013   Procedure: TEMPORARY PACEMAKER INSERTION;  Surgeon: Kathleene Hazelhristopher D McAlhany, MD;  [facility];  Service: Cardiovascular;  Laterality: N/A;  . TUBAL LIGATION      Current Outpatient Medications  Medication Sig Dispense Refill  . ALPRAZolam (XANAX) 1 MG tablet TAKE 1/2 TO 1 TABLET THREE TIMES DAILY AS NEEDED FOR ANXIETY 270 tablet 0  . BIOTIN PO Take 1 tablet by mouth daily.     . Calcium Carbonate-Vitamin D (CALCIUM + D PO) Take 1 tablet by mouth daily.     . Cholecalciferol (VITAMIN D PO) Take 5,000 Units by mouth daily.     . citalopram (CELEXA) 20 MG tablet TAKE 1 TABLET EVERY DAY  (NEW  DOSE) 90 tablet 0  . fluticasone (FLONASE) 50 MCG/ACT nasal spray Place 2 sprays into both nostrils daily. (Patient taking differently: Place 2 sprays into both nostrils daily as needed for allergies or rhinitis. ) 16 g 0  . FOLIC ACID PO Take 1 tablet by mouth daily.     . hydrochlorothiazide (HYDRODIURIL) 25 MG  tablet Take 25 mg by mouth daily as needed (swelling).     . hydrocortisone (PROCTOZONE-HC) 2.5 % rectal cream PLACE 1 APPLICATION RECTALLY 2 (TWO) TIMES DAILY. 28.35 g 5  . MAGNESIUM PO Take 500 mg by mouth daily.     . metoprolol succinate (TOPROL-XL) 50 MG 24 hr tablet TAKE 1 TABLET EVERY DAY FOR BLOOD PRESSURE 90 tablet 1  . Multiple Vitamin (MULTIVITAMIN) tablet Take 1 tablet by mouth daily.    . Tretinoin, Facial Wrinkles, (TRETINOIN, EMOLLIENT,) 0.05 % CREA APPLY TO AFFECTED AREA EVERY DAY AS DIRECTED 40 g 1  . apixaban (ELIQUIS) 5 MG TABS tablet Take 1 tablet  (5 mg total) by mouth 2 (two) times daily. 60 tablet 0  . Ascorbic Acid (VITAMIN C PO) Take 1 tablet by mouth daily.     . Coenzyme Q10 (CO Q 10 PO) Take 1 capsule by mouth daily.    . ranitidine (ZANTAC) 300 MG tablet Take 300 mg by mouth daily as needed for heartburn.     No current facility-administered medications for this encounter.     Allergies  Allergen Reactions  . Actonel [Risedronate Sodium]     Pt didn't like the way it made her feel   . Boniva [Ibandronic Acid]     Pt didn't like the way it made her feel  . Evista [Raloxifene]     Pt didn't like the way it made her feel  . Fosamax [Alendronate Sodium]     Pt didn't like the way it made her feel   . Levsin [Hyoscyamine Sulfate]     unknown  . Mobic [Meloxicam]     unknown   . Topamax [Topiramate]     unknown   . Zoloft [Sertraline Hcl]     unknown     Social History   Socioeconomic History  . Marital status: Widowed    Spouse name: Not on file  . Number of children: Not on file  . Years of education: Not on file  . Highest education level: Not on file  Occupational History  . Occupation: Armed forces operational officer at [employer]  . Financial resource strain: Not on file  . Food insecurity:    Worry: Not on file    Inability: Not on file  . Transportation needs:    Medical: Not on file    Non-medical: Not on file  Tobacco Use  . Smoking status: Former Smoker    Types: Cigarettes    Last attempt to quit: 10/09/1975    Years since quitting: 43.0  . Smokeless tobacco: Never Used  Substance and Sexual Activity  . Alcohol use: No  . Drug use: No  . Sexual activity: Never    Birth control/protection: Surgical, Post-menopausal  Lifestyle  . Physical activity:    Days per week: Not on file    Minutes per session: Not on file  . Stress: Not on file  Relationships  . Social connections:    Talks on phone: Not on file    Gets together: Not on file    Attends religious service: Not on file    Active  member of club or organization: Not on file    Attends meetings of clubs or organizations: Not on file    Relationship status: Not on file  . Intimate partner violence:    Fear of current or ex partner: Not on file    Emotionally abused: Not on file    Physically abused: Not on file  Forced sexual activity: Not on file  Other Topics Concern  . Not on file  Social History Narrative   Lives alone. Dtr in the area.    Family History  Problem Relation Age of Onset  . Diabetes Mother   . Heart disease Mother   . Diabetes Brother   . Hypertension Brother   . Heart disease Brother   . Breast cancer Sister 17  . Aneurysm Brother   . Stroke Brother   . Cancer Father        prostate  . Diabetes Brother   . Heart disease Brother     ROS- All systems are reviewed and negative except as per the HPI above  Physical Exam: Vitals:   10/15/18 1346  BP: 98/68  Pulse: 63  Weight: 74.8 kg  Height: 5' 4.5" (1.638 m)   Wt Readings from Last 3 Encounters:  10/15/18 74.8 kg  02/06/18 72.6 kg  01/03/18 73.5 kg    Labs: Lab Results  Component Value Date   NA 140 10/15/2018   K 4.1 10/15/2018   CL 104 10/15/2018   CO2 25 10/15/2018   GLUCOSE 103 (H) 10/15/2018   BUN 12 10/15/2018   CREATININE 0.83 10/15/2018   CALCIUM 9.1 10/15/2018   MG 2.1 10/23/2017   Lab Results  Component Value Date   INR 0.98 06/10/2013   Lab Results  Component Value Date   CHOL 183 10/23/2017   HDL 53 10/23/2017   LDLCALC 112 (H) 10/23/2017   TRIG 83 10/23/2017     GEN- The patient is well appearing, alert and oriented x 3 today.   Head- normocephalic, atraumatic Eyes-  Sclera clear, conjunctiva pink Ears- hearing intact Oropharynx- clear Neck- supple, no JVP Lymph- no cervical lymphadenopathy Lungs- Clear to ausculation bilaterally, normal work of breathing Heart- Regular rate and rhythm, no murmurs, rubs or gallops, PMI not laterally displaced GI- soft, NT, ND, + BS Extremities- no  clubbing, cyanosis, or edema MS- no significant deformity or atrophy Skin- no rash or lesion Psych- euthymic mood, full affect Neuro- strength and sensation are intact  EKG- AV dual  paced rhythm at 63 bpm, pr int 186 ms, qrs int 186 ms, qtc 497 ms   Per Phone note of Ermalinda Barrios with pt informed her of that her monitor had transmitted an episode of AF > 24hrs informed pt that this could put her an increased risk of stroke pt agreeable to apt with AF clinic on 10/15/2018 at 2:00pm to discuss OAC.    Assessment and Plan: 1. New onset asymptomatic afib General education re afib New  by device Continue metoprolol succinate 50 mg qd  2. CHA2DS2VASc score of 5 Denies bleeding history Discussed  xarelto , coumadin and eliquis  Pt would like to start eilquis 5 mg bid Will draw bmet/cbc as recent labs are not in epic Bleeding precautions discussed  30 day fee coupon given  See back in 3 weeks, sooner if needed  Alexandra Wheeler Afib Clinic [facility] [address] [place], [place] [address] [phone]

## 2018-10-15 NOTE — Patient Instructions (Signed)
Start Eliquis 5mg twice a day  Stop aspirin 

## 2018-10-16 ENCOUNTER — Encounter: Payer: Self-pay | Admitting: Cardiology

## 2018-10-22 ENCOUNTER — Other Ambulatory Visit: Payer: Self-pay | Admitting: Internal Medicine

## 2018-10-22 ENCOUNTER — Other Ambulatory Visit: Payer: Self-pay | Admitting: *Deleted

## 2018-10-22 DIAGNOSIS — I10 Essential (primary) hypertension: Secondary | ICD-10-CM

## 2018-10-22 MED ORDER — METOPROLOL SUCCINATE ER 50 MG PO TB24: ORAL_TABLET | ORAL | 0 refills | Status: DC

## 2018-10-22 MED ORDER — APIXABAN 5 MG PO TABS: 5.0000 mg | ORAL_TABLET | Freq: Two times a day (BID) | ORAL | 0 refills | Status: DC

## 2018-10-24 ENCOUNTER — Other Ambulatory Visit: Payer: Self-pay | Admitting: Physician Assistant

## 2018-11-02 ENCOUNTER — Telehealth (HOSPITAL_COMMUNITY): Payer: Self-pay | Admitting: *Deleted

## 2018-11-02 NOTE — Telephone Encounter (Signed)
Spoke to patient today and she does not want to keep the appt she currently has with Rudi Cocoonna Carroll, NP she is asking that this appt be switched to Dr. Johney FrameAllred.   I sent a message to scheduling to see if we can get this moved within the next couple weeks since following up on Eliquis.  Pt stated she "just wanted to see her Dr., and not someone in our clinic"

## 2018-11-05 ENCOUNTER — Other Ambulatory Visit (HOSPITAL_COMMUNITY): Payer: Self-pay | Admitting: Nurse Practitioner

## 2018-11-06 ENCOUNTER — Ambulatory Visit (HOSPITAL_COMMUNITY): Payer: Medicare PPO | Admitting: Nurse Practitioner

## 2018-11-15 ENCOUNTER — Ambulatory Visit: Payer: Medicare PPO | Admitting: Internal Medicine

## 2018-11-15 ENCOUNTER — Encounter: Payer: Self-pay | Admitting: Internal Medicine

## 2018-11-15 VITALS — BP 108/82 | HR 68 | Temp 97.4°F | Resp 16 | Ht 64.25 in | Wt 165.0 lb

## 2018-11-15 DIAGNOSIS — R7309 Other abnormal glucose: Secondary | ICD-10-CM

## 2018-11-15 DIAGNOSIS — K219 Gastro-esophageal reflux disease without esophagitis: Secondary | ICD-10-CM

## 2018-11-15 DIAGNOSIS — E782 Mixed hyperlipidemia: Secondary | ICD-10-CM | POA: Diagnosis not present

## 2018-11-15 DIAGNOSIS — Z0001 Encounter for general adult medical examination with abnormal findings: Secondary | ICD-10-CM

## 2018-11-15 DIAGNOSIS — Z0000 Encounter for general adult medical examination without abnormal findings: Secondary | ICD-10-CM | POA: Diagnosis not present

## 2018-11-15 DIAGNOSIS — Z79899 Other long term (current) drug therapy: Secondary | ICD-10-CM

## 2018-11-15 DIAGNOSIS — R7303 Prediabetes: Secondary | ICD-10-CM | POA: Diagnosis not present

## 2018-11-15 DIAGNOSIS — Z1211 Encounter for screening for malignant neoplasm of colon: Secondary | ICD-10-CM

## 2018-11-15 DIAGNOSIS — I442 Atrioventricular block, complete: Secondary | ICD-10-CM

## 2018-11-15 DIAGNOSIS — F32A Depression, unspecified: Secondary | ICD-10-CM

## 2018-11-15 DIAGNOSIS — Z950 Presence of cardiac pacemaker: Secondary | ICD-10-CM

## 2018-11-15 DIAGNOSIS — Z136 Encounter for screening for cardiovascular disorders: Secondary | ICD-10-CM

## 2018-11-15 DIAGNOSIS — I10 Essential (primary) hypertension: Secondary | ICD-10-CM | POA: Diagnosis not present

## 2018-11-15 DIAGNOSIS — E559 Vitamin D deficiency, unspecified: Secondary | ICD-10-CM

## 2018-11-15 DIAGNOSIS — Z8249 Family history of ischemic heart disease and other diseases of the circulatory system: Secondary | ICD-10-CM

## 2018-11-15 DIAGNOSIS — Z87891 Personal history of nicotine dependence: Secondary | ICD-10-CM

## 2018-11-15 DIAGNOSIS — Z1212 Encounter for screening for malignant neoplasm of rectum: Secondary | ICD-10-CM

## 2018-11-15 DIAGNOSIS — F329 Major depressive disorder, single episode, unspecified: Secondary | ICD-10-CM

## 2018-11-15 NOTE — Patient Instructions (Signed)
We Do NOT Approve of  [facility], [employer]  Our Patients  To Do Home Visits & We Do NOT Approve of [facility] > > > > > > > > > > > > > > > > > > > > > > > > > > > > > > > > > > > > > > >  Preventive Care for Adults  A healthy lifestyle and preventive care can promote health and wellness. Preventive health guidelines for women include the following key practices.  A routine yearly physical is a good way to check with your health care provider about your health and preventive screening. It is a chance to share any concerns and updates on your health and to receive a thorough exam.  Visit your dentist for a routine exam and preventive care every 6 months. Brush your teeth twice a day and floss once a day. Good oral hygiene prevents tooth decay and gum disease.  The frequency of eye exams is based on your age, health, family medical history, use of contact lenses, and other factors. Follow your health care provider's recommendations for frequency of eye exams.  Eat a healthy diet. Foods like vegetables, fruits, whole grains, low-fat dairy products, and lean protein foods contain the nutrients you need without too many calories. Decrease your intake of foods high in solid fats, added sugars, and salt. Eat the right amount of calories for you. Get information about a proper diet from your health care provider, if necessary.  Regular physical exercise is one of the most important things you can do for your health. Most adults should get at least 150 minutes of moderate-intensity exercise (any activity that increases your heart rate and causes you to sweat) each week. In addition, most adults need muscle-strengthening exercises on 2 or more days a week.  Maintain a healthy weight. The body mass index (BMI) is a screening tool to identify possible weight problems. It provides an estimate of body fat based on height and weight. Your health care provider can find your BMI  and can help you achieve or maintain a healthy weight. For adults 20 years and older:  A BMI below 18.5 is considered underweight.  A BMI of 18.5 to 24.9 is normal.  A BMI of 25 to 29.9 is considered overweight.  A BMI of 30 and above is considered obese.  Maintain normal blood lipids and cholesterol levels by exercising and minimizing your intake of saturated fat. Eat a balanced diet with plenty of fruit and vegetables. If your lipid or cholesterol levels are high, you are over 50, or you are at high risk for heart disease, you may need your cholesterol levels checked more frequently. Ongoing high lipid and cholesterol levels should be treated with medicines if diet and exercise are not working.  If you smoke, find out from your health care provider how to quit. If you do not use tobacco, do not start.  Lung cancer screening is recommended for adults aged 50-80 years who are at high risk for developing lung cancer because of a history of smoking. A yearly low-dose CT scan of the lungs is recommended for people who have at least a 30-pack-year history of smoking and are a current smoker or have quit within the past 15 years. A pack year of smoking is smoking an average of 1 pack of cigarettes a day for 1 year (for example: 1 pack a day for 30 years or 2 packs a day  for 15 years). Yearly screening should continue until the smoker has stopped smoking for at least 15 years. Yearly screening should be stopped for people who develop a health problem that would prevent them from having lung cancer treatment.  Avoid use of street drugs. Do not share needles with anyone. Ask for help if you need support or instructions about stopping the use of drugs.  High blood pressure causes heart disease and increases the risk of stroke.  Ongoing high blood pressure should be treated with medicines if weight loss and exercise do not work.  If you are 29-34 years old, ask your health care provider if you should take  aspirin to prevent strokes.  Diabetes screening involves taking a blood sample to check your fasting blood sugar level. This should be done once every 3 years, after age 27, if you are within normal weight and without risk factors for diabetes. Testing should be considered at a younger age or be carried out more frequently if you are overweight and have at least 1 risk factor for diabetes.  Breast cancer screening is essential preventive care for women. You should practice "breast self-awareness." This means understanding the normal appearance and feel of your breasts and may include breast self-examination. Any changes detected, no matter how small, should be reported to a health care provider. Women in their 64s and 30s should have a clinical breast exam (CBE) by a health care provider as part of a regular health exam every 1 to 3 years. After age 69, women should have a CBE every year. Starting at age 6, women should consider having a mammogram (breast X-ray test) every year. Women who have a family history of breast cancer should talk to their health care provider about genetic screening. Women at a high risk of breast cancer should talk to their health care providers about having an MRI and a mammogram every year.  Breast cancer gene (BRCA)-related cancer risk assessment is recommended for women who have family members with BRCA-related cancers. BRCA-related cancers include breast, ovarian, tubal, and peritoneal cancers. Having family members with these cancers may be associated with an increased risk for harmful changes (mutations) in the breast cancer genes BRCA1 and BRCA2. Results of the assessment will determine the need for genetic counseling and BRCA1 and BRCA2 testing.  Routine pelvic exams to screen for cancer are no longer recommended for nonpregnant women who are considered low risk for cancer of the pelvic organs (ovaries, uterus, and vagina) and who do not have symptoms. Ask your health  care provider if a screening pelvic exam is right for you.  If you have had past treatment for cervical cancer or a condition that could lead to cancer, you need Pap tests and screening for cancer for at least 20 years after your treatment. If Pap tests have been discontinued, your risk factors (such as having a new sexual partner) need to be reassessed to determine if screening should be resumed. Some women have medical problems that increase the chance of getting cervical cancer. In these cases, your health care provider may recommend more frequent screening and Pap tests.    Colorectal cancer can be detected and often prevented. Most routine colorectal cancer screening begins at the age of 21 years and continues through age 8 years. However, your health care provider may recommend screening at an earlier age if you have risk factors for colon cancer. On a yearly basis, your health care provider may provide home test kits to check  for hidden blood in the stool. Use of a small camera at the end of a tube, to directly examine the colon (sigmoidoscopy or colonoscopy), can detect the earliest forms of colorectal cancer. Talk to your health care provider about this at age 21, when routine screening begins.  Direct exam of the colon should be repeated every 5-10 years through age 73 years, unless early forms of pre-cancerous polyps or small growths are found.  Osteoporosis is a disease in which the bones lose minerals and strength with aging. This can result in serious bone fractures or breaks. The risk of osteoporosis can be identified using a bone density scan. Women ages 33 years and over and women at risk for fractures or osteoporosis should discuss screening with their health care providers. Ask your health care provider whether you should take a calcium supplement or vitamin D to reduce the rate of osteoporosis.  Menopause can be associated with physical symptoms and risks. Hormone replacement therapy  is available to decrease symptoms and risks. You should talk to your health care provider about whether hormone replacement therapy is right for you.  Use sunscreen. Apply sunscreen liberally and repeatedly throughout the day. You should seek shade when your shadow is shorter than you. Protect yourself by wearing long sleeves, pants, a wide-brimmed hat, and sunglasses year round, whenever you are outdoors.  Once a month, do a whole body skin exam, using a mirror to look at the skin on your back. Tell your health care provider of new moles, moles that have irregular borders, moles that are larger than a pencil eraser, or moles that have changed in shape or color.  Stay current with required vaccines (immunizations).  Influenza vaccine. All adults should be immunized every year.  Tetanus, diphtheria, and acellular pertussis (Td, Tdap) vaccine. Pregnant women should receive 1 dose of Tdap vaccine during each pregnancy. The dose should be obtained regardless of the length of time since the last dose. Immunization is preferred during the 27th-36th week of gestation. An adult who has not previously received Tdap or who does not know her vaccine status should receive 1 dose of Tdap. This initial dose should be followed by tetanus and diphtheria toxoids (Td) booster doses every 10 years. Adults with an unknown or incomplete history of completing a 3-dose immunization series with Td-containing vaccines should begin or complete a primary immunization series including a Tdap dose. Adults should receive a Td booster every 10 years.    Zoster vaccine. One dose is recommended for adults aged 51 years or older unless certain conditions are present.    Pneumococcal 13-valent conjugate (PCV13) vaccine. When indicated, a person who is uncertain of her immunization history and has no record of immunization should receive the PCV13 vaccine. An adult aged 102 years or older who has certain medical conditions and has not  been previously immunized should receive 1 dose of PCV13 vaccine. This PCV13 should be followed with a dose of pneumococcal polysaccharide (PPSV23) vaccine. The PPSV23 vaccine dose should be obtained at least 1 or more year(s) after the dose of PCV13 vaccine. An adult aged 76 years or older who has certain medical conditions and previously received 1 or more doses of PPSV23 vaccine should receive 1 dose of PCV13. The PCV13 vaccine dose should be obtained 1 or more years after the last PPSV23 vaccine dose.    Pneumococcal polysaccharide (PPSV23) vaccine. When PCV13 is also indicated, PCV13 should be obtained first. All adults aged 69 years and older should  be immunized. An adult younger than age 65 years who has certain medical conditions should be immunized. Any person who resides in a nursing home or long-term care facility should be immunized. An adult smoker should be immunized. People with an immunocompromised condition and certain other conditions should receive both PCV13 and PPSV23 vaccines. People with human immunodeficiency virus (HIV) infection should be immunized as soon as possible after diagnosis. Immunization during chemotherapy or radiation therapy should be avoided. Routine use of PPSV23 vaccine is not recommended for American Indians, [place] Natives, or people younger than 65 years unless there are medical conditions that require PPSV23 vaccine. When indicated, people who have unknown immunization and have no record of immunization should receive PPSV23 vaccine. One-time revaccination 5 years after the first dose of PPSV23 is recommended for people aged 19-64 years who have chronic kidney failure, nephrotic syndrome, asplenia, or immunocompromised conditions. People who received 1-2 doses of PPSV23 before age 65 years should receive another dose of PPSV23 vaccine at age 65 years or later if at least 5 years have passed since the previous dose. Doses of PPSV23 are not needed for people immunized  with PPSV23 at or after age 65 years.   Preventive Services / Frequency  Ages 65 years and over  Blood pressure check.  Lipid and cholesterol check.  Lung cancer screening. / Every year if you are aged 55-80 years and have a 30-pack-year history of smoking and currently smoke or have quit within the past 15 years. Yearly screening is stopped once you have quit smoking for at least 15 years or develop a health problem that would prevent you from having lung cancer treatment.  Clinical breast exam.** / Every year after age 40 years.   BRCA-related cancer risk assessment.** / For women who have family members with a BRCA-related cancer (breast, ovarian, tubal, or peritoneal cancers).  Mammogram.** / Every year beginning at age 40 years and continuing for as long as you are in good health. Consult with your health care provider.  Pap test.** / Every 3 years starting at age 30 years through age 65 or 70 years with 3 consecutive normal Pap tests. Testing can be stopped between 65 and 70 years with 3 consecutive normal Pap tests and no abnormal Pap or HPV tests in the past 10 years.  Fecal occult blood test (FOBT) of stool. / Every year beginning at age 50 years and continuing until age 75 years. You may not need to do this test if you get a colonoscopy every 10 years.  Flexible sigmoidoscopy or colonoscopy.** / Every 5 years for a flexible sigmoidoscopy or every 10 years for a colonoscopy beginning at age 50 years and continuing until age 75 years.  Hepatitis C blood test.** / For all people born from 1945 through 1965 and any individual with known risks for hepatitis C.  Osteoporosis screening.** / A one-time screening for women ages 65 years and over and women at risk for fractures or osteoporosis.  Skin self-exam. / Monthly.  Influenza vaccine. / Every year.  Tetanus, diphtheria, and acellular pertussis (Tdap/Td) vaccine.** / 1 dose of Td every 10 years.  Zoster vaccine.** / 1 dose  for adults aged 60 years or older.  Pneumococcal 13-valent conjugate (PCV13) vaccine.** / Consult your health care provider.  Pneumococcal polysaccharide (PPSV23) vaccine.** / 1 dose for all adults aged 65 years and older. Screening for abdominal aortic aneurysm (AAA)  by ultrasound is recommended for people who have history of high blood pressure   or who are current or former smokers. ++++++++++++++++++++ Recommend Adult Low Dose Aspirin or  coated  Aspirin 81 mg daily  To reduce risk of Colon Cancer 20 %,  Skin Cancer 26 % ,  Melanoma 46%  and  Pancreatic cancer 60% ++++++++++++++++++++ Vitamin D goal  is between 70-100.  Please make sure that you are taking your Vitamin D as directed.  It is very important as a natural anti-inflammatory  helping hair, skin, and nails, as well as reducing stroke and heart attack risk.  It helps your bones and helps with mood. It also decreases numerous cancer risks so please take it as directed.  Low Vit D is associated with a 200-300% higher risk for CANCER  and 200-300% higher risk for HEART   ATTACK  &  STROKE.   .....................................Marland Kitchen It is also associated with higher death rate at younger ages,  autoimmune diseases like Rheumatoid arthritis, Lupus, Multiple Sclerosis.    Also many other serious conditions, like depression, Alzheimer's Dementia, infertility, muscle aches, fatigue, fibromyalgia - just to name a few. ++++++++++++++++++ Recommend the book "The END of DIETING" by Dr Excell Seltzer  & the book "The END of DIABETES " by Dr Excell Seltzer At [email or web address] - get book & Audio CD's    Being diabetic has a  300% increased risk for heart attack, stroke, cancer, and alzheimer- type vascular dementia. It is very important that you work harder with diet by avoiding all foods that are white. Avoid white rice (brown & wild rice is OK), white potatoes (sweetpotatoes in moderation is OK), White bread or wheat bread or anything made out of  white flour like bagels, donuts, rolls, buns, biscuits, cakes, pastries, cookies, pizza crust, and pasta (made from white flour & egg whites) - vegetarian pasta or spinach or wheat pasta is OK. Multigrain breads like Arnold's or Pepperidge [employer], or multigrain sandwich thins or flatbreads.  Diet, exercise and weight loss can reverse and cure diabetes in the early stages.  Diet, exercise and weight loss is very important in the control and prevention of complications of diabetes which affects every system in your body, ie. Brain - dementia/stroke, eyes - glaucoma/blindness, heart - heart attack/heart failure, kidneys - dialysis, stomach - gastric paralysis, intestines - malabsorption, nerves - severe painful neuritis, circulation - gangrene & loss of a leg(s), and finally cancer and Alzheimers.    I recommend avoid fried & greasy foods,  sweets/candy, white rice (brown or wild rice or Quinoa is OK), white potatoes (sweet potatoes are OK) - anything made from white flour - bagels, doughnuts, rolls, buns, biscuits,white and wheat breads, pizza crust and traditional pasta made of white flour & egg white(vegetarian pasta or spinach or wheat pasta is OK).  Multi-grain bread is OK - like multi-grain flat bread or sandwich thins. Avoid alcohol in excess. Exercise is also important.    Eat all the vegetables you want - avoid meat, especially red meat and dairy - especially cheese.  Cheese is the most concentrated form of trans-fats which is the worst thing to clog up our arteries. Veggie cheese is OK which can be found in the fresh produce section at [employer] or [employer] or [employer]  +++++++++++++++++++ DASH Eating Plan  DASH stands for "Dietary Approaches to Stop Hypertension."   The DASH eating plan is a healthy eating plan that has been shown to reduce high blood pressure (hypertension). Additional health benefits may include reducing the risk of type 2 diabetes mellitus, heart disease,  and stroke. The  DASH eating plan may also help with weight loss. WHAT DO I NEED TO KNOW ABOUT THE DASH EATING PLAN? For the DASH eating plan, you will follow these general guidelines:  Choose foods with a percent daily value for sodium of less than 5% (as listed on the food label).  Use salt-free seasonings or herbs instead of table salt or sea salt.  Check with your health care provider or pharmacist before using salt substitutes.  Eat lower-sodium products, often labeled as "lower sodium" or "no salt added."  Eat fresh foods.  Eat more vegetables, fruits, and low-fat dairy products.  Choose whole grains. Look for the word "whole" as the first word in the ingredient list.  Choose fish   Limit sweets, desserts, sugars, and sugary drinks.  Choose heart-healthy fats.  Eat veggie cheese   Eat more home-cooked food and less restaurant, buffet, and fast food.  Limit fried foods.  Cook foods using methods other than frying.  Limit canned vegetables. If you do use them, rinse them well to decrease the sodium.  When eating at a restaurant, ask that your food be prepared with less salt, or no salt if possible.                      WHAT FOODS CAN I EAT? Read Dr Fara Olden Fuhrman's books on The End of Dieting & The End of Diabetes  Grains Whole grain or whole wheat bread. Brown rice. Whole grain or whole wheat pasta. Quinoa, bulgur, and whole grain cereals. Low-sodium cereals. Corn or whole wheat flour tortillas. Whole grain cornbread. Whole grain crackers. Low-sodium crackers.  Vegetables Fresh or frozen vegetables (raw, steamed, roasted, or grilled). Low-sodium or reduced-sodium tomato and vegetable juices. Low-sodium or reduced-sodium tomato sauce and paste. Low-sodium or reduced-sodium canned vegetables.   Fruits All fresh, canned (in natural juice), or frozen fruits.  Protein Products  All fish and seafood.  Dried beans, peas, or lentils. Unsalted nuts and seeds. Unsalted canned  beans.  Dairy Low-fat dairy products, such as skim or 1% milk, 2% or reduced-fat cheeses, low-fat ricotta or cottage cheese, or plain low-fat yogurt. Low-sodium or reduced-sodium cheeses.  Fats and Oils Tub margarines without trans fats. Light or reduced-fat mayonnaise and salad dressings (reduced sodium). Avocado. Safflower, olive, or canola oils. Natural peanut or almond butter.  Other Unsalted popcorn and pretzels. The items listed above may not be a complete list of recommended foods or beverages. Contact your dietitian for more options.  +++++++++++++++  WHAT FOODS ARE NOT RECOMMENDED? Grains/ White flour or wheat flour White bread. White pasta. White rice. Refined cornbread. Bagels and croissants. Crackers that contain trans fat.  Vegetables  Creamed or fried vegetables. Vegetables in a . Regular canned vegetables. Regular canned tomato sauce and paste. Regular tomato and vegetable juices.  Fruits Dried fruits. Canned fruit in light or heavy syrup. Fruit juice.  Meat and Other Protein Products Meat in general - RED meat & White meat.  Fatty cuts of meat. Ribs, chicken wings, all processed meats as bacon, sausage, bologna, salami, fatback, hot dogs, bratwurst and packaged luncheon meats.  Dairy Whole or 2% milk, cream, half-and-half, and cream cheese. Whole-fat or sweetened yogurt. Full-fat cheeses or blue cheese. Non-dairy creamers and whipped toppings. Processed cheese, cheese spreads, or cheese curds.  Condiments Onion and garlic salt, seasoned salt, table salt, and sea salt. Canned and packaged gravies. Worcestershire sauce. Tartar sauce. Barbecue sauce. Teriyaki sauce. Soy sauce, including reduced  sodium. Steak sauce. Fish sauce. Oyster sauce. Cocktail sauce. Horseradish. Ketchup and mustard. Meat flavorings and tenderizers. Bouillon cubes. Hot sauce. Tabasco sauce. Marinades. Taco seasonings. Relishes.  Fats and Oils Butter, stick margarine, lard, shortening and bacon  fat. Coconut, palm kernel, or palm oils. Regular salad dressings.  Pickles and olives. Salted popcorn and pretzels.  The items listed above may not be a complete list of foods and beverages to avoid.

## 2018-11-15 NOTE — Progress Notes (Signed)
[facility] Lucky Cowboy, M.D.     Dyanne Carrel. Steffanie Dunn, P.A.-C Judd Gaudier, DNP [facility] [address] [place], [place]. [address] Telephone [phone] Telefax [phone] Annual Screening/Preventative Visit & Comprehensive Evaluation &  Examination     This very nice 78 y.o. WWF presents for a Screening /Preventative Visit & comprehensive evaluation and management of multiple medical co-morbidities.  Patient has been followed for HTN, ASHD /CHB /PPM HLD, Prediabetes  and Vitamin D Deficiency. Patient's GERD is controlled with diet & her meds. Patient has hx/o Depression - stable on current meds.      HTN predates circa 2005. Patient's BP has been controlled at home and patient denies any cardiac symptoms as chest pain, palpitations, shortness of breath, dizziness or ankle swelling. In 2014, she had a PPM implanted for CHB. Last month, she was dx'd w/Afib and started on Eliquis. Today's BP is at goal: - 108/82.      Patient's hyperlipidemia is controlled with diet and medications. Patient denies myalgias or other medication SE's. Last lipids were  Lab Results  Component Value Date   CHOL 183 10/23/2017   HDL 53 10/23/2017   LDLCALC 112 (H) 10/23/2017   TRIG 83 10/23/2017   CHOLHDL 3.5 10/23/2017      Patient has hx/o prediabetes (A1c 5.9% / 2011 & 2016) and patient denies reactive hypoglycemic symptoms, visual blurring, diabetic polys or paresthesias. Last A1c was near goal: Lab Results  Component Value Date   HGBA1C 5.7 (H) 10/23/2017      Finally, patient has history of Vitamin D Deficiency ("34" / 2008) and last Vitamin D was at goal: Lab Results  Component Value Date   VD25OH 58 10/23/2017   Current Outpatient Medications on File Prior to Visit  Medication Sig  . ALPRAZolam (XANAX) 1 MG tablet TAKE 1/2 TO 1 TABLET THREE TIMES DAILY AS NEEDED FOR ANXIETY  . Ascorbic Acid (VITAMIN C PO) Take 1 tablet  by mouth daily.   Marland Kitchen BIOTIN PO Take 1 tablet by mouth daily.   . Calcium Carbonate-Vitamin D (CALCIUM + D PO) Take 1 tablet by mouth daily.   . Cholecalciferol (VITAMIN D PO) Take 5,000 Units by mouth daily.   . citalopram (CELEXA) 20 MG tablet TAKE 1 TABLET EVERY DAY  (NEW  DOSE)  . Coenzyme Q10 (CO Q 10 PO) Take 1 capsule by mouth daily.  Marland Kitchen ELIQUIS 5 MG TABS tablet TAKE 1 TABLET BY MOUTH 2 TIMES DAILY  . fluticasone (FLONASE) 50 MCG/ACT nasal spray Place 2 sprays into both nostrils daily. (Patient taking differently: Place 2 sprays into both nostrils daily as needed for allergies or rhinitis. )  . FOLIC ACID PO Take 1 tablet by mouth daily.   . hydrochlorothiazide (HYDRODIURIL) 25 MG tablet Take 25 mg by mouth daily as needed (swelling).   . hydrocortisone (PROCTOZONE-HC) 2.5 % rectal cream PLACE 1 APPLICATION RECTALLY 2 (TWO) TIMES DAILY.  Marland Kitchen MAGNESIUM PO Take 500 mg by mouth daily.   . metoprolol succinate (TOPROL-XL) 50 MG 24 hr tablet Take 1 tablet daily for BP  . Multiple Vitamin (MULTIVITAMIN) tablet Take 1 tablet by mouth daily.  . ranitidine (ZANTAC) 300 MG tablet Take 300 mg by mouth daily as needed for heartburn.  . Tretinoin, Facial Wrinkles, (TRETINOIN, EMOLLIENT,) 0.05 % CREA APPLY TO AFFECTED AREA EVERY DAY AS DIRECTED   No current facility-administered medications on file prior to visit.    Allergies  Allergen Reactions  .  Actonel [Risedronate Sodium] Pt didn't like the way it made her feel  . Boniva [Ibandronic Acid] Pt didn't like the way it made her feel  . Evista [Raloxifene] Pt didn't like the way it made her feel  . Fosamax [Alendronate Sodium] Pt didn't like the way it made her feel  . Levsin [Hyoscyamine Sulfate] unknown  . Mobic [Meloxicam] unknown   . Topamax [Topiramate] unknown  . Zoloft [Sertraline Hcl] unknown   Past Medical History:  Diagnosis Date  . Anxiety   . Atrophic vaginitis   . Chest pain    a. Adenosine sestamibi (1/14) with no evidence of  ischemia or infarction, EF 63%.   . Colon polyps   . Complete heart block (HCC) 06-2013   status post pacemaker implantation by Dr Johney FrameAllred 06-11-2013  . Depression   . DI (detrusor instability)   . GERD (gastroesophageal reflux disease)    History + Hyplori via EGD  . H/O echocardiogram    a. Echo 11/2012:EF 50-55%, focal basal septal hypertrophy with some mitral valve SAM (mild) and MR.  This may be a sigmoid septum with advanced age or could be a variant of hypertrophic cardiomyopathy.  Marland Kitchen. HTN (hypertension)   . Hyperlipidemia   . LBBB (left bundle branch block)   . Leukopenia 05/19/2014  . Lymphocytosis 05/19/2014  . Migraines   . Osteoporosis   . Prediabetes   . Vitamin D deficiency    Health Maintenance  Topic Date Due  . TETANUS/TDAP  05/08/2021  . INFLUENZA VACCINE  Completed  . DEXA SCAN  Completed  . PNA vac Low Risk Adult  Completed   Immunization History  Administered Date(s) Administered  . Influenza Split 08/06/2014, 09/20/2017  . Influenza, High Dose Seasonal PF 09/01/2015  . Influenza-Unspecified 09/12/2018  . Pneumococcal Conjugate-13 09/01/2015  . Pneumococcal-Unspecified 10/08/2010  . Td 05/09/2011  . Zoster 05/09/2011   Last Colon - 11/11/2013  Dr Rosana HoesBuccimi - Recc 5 yr f/u due Jan 2020  Last MGM - 07/23/2018  Past Surgical History:  Procedure Laterality Date  . BREAST EXCISIONAL BIOPSY Right   . BREAST SURGERY     Breast cyst  . HEMORRHOID SURGERY    . LEFT HEART CATHETERIZATION WITH CORONARY ANGIOGRAM N/A 06/11/2013   Procedure: LEFT HEART CATHETERIZATION WITH CORONARY ANGIOGRAM;  Surgeon: Kathleene Hazelhristopher D McAlhany, MD;  [facility];  Service: Cardiovascular;  Laterality: N/A;  . PACEMAKER INSERTION  06-11-2013   STJ dual chamber pacemaker implanted by Dr Johney FrameAllred for complete heart block  . PERMANENT PACEMAKER INSERTION Left 06/11/2013   Procedure: PERMANENT PACEMAKER INSERTION;  Surgeon: Kathleene Hazelhristopher D McAlhany, MD;  [facility];  Service:  Cardiovascular;  Laterality: Left;  . PERMANENT PACEMAKER INSERTION N/A 06/11/2013   Procedure: PERMANENT PACEMAKER INSERTION;  Surgeon: Hillis RangeJames Allred, MD;  [facility];  Service: Cardiovascular;  Laterality: N/A;  . TEMPORARY PACEMAKER INSERTION N/A 06/11/2013   Procedure: TEMPORARY PACEMAKER INSERTION;  Surgeon: Kathleene Hazelhristopher D McAlhany, MD;  [facility];  Service: Cardiovascular;  Laterality: N/A;  . TUBAL LIGATION     Family History  Problem Relation Age of Onset  . Diabetes Mother   . Heart disease Mother   . Diabetes Brother   . Hypertension Brother   . Heart disease Brother   . Breast cancer Sister [id number]  . Aneurysm Brother   . Stroke Brother   . Cancer Father        prostate  . Diabetes Brother   . Heart  disease Brother    Social History   Tobacco Use  . Smoking status: Former Smoker    Types: Cigarettes    Last attempt to quit: 10/09/1975    Years since quitting: 43.1  . Smokeless tobacco: Never Used  Substance Use Topics  . Alcohol use: No  . Drug use: No    ROS Constitutional: Denies fever, chills, weight loss/gain, headaches, insomnia,  night sweats, and change in appetite. Does c/o fatigue. Eyes: Denies redness, blurred vision, diplopia, discharge, itchy, watery eyes.  ENT: Denies discharge, congestion, post nasal drip, epistaxis, sore throat, earache, hearing loss, dental pain, Tinnitus, Vertigo, Sinus pain, snoring.  Cardio: Denies chest pain, palpitations, irregular heartbeat, syncope, dyspnea, diaphoresis, orthopnea, PND, claudication, edema Respiratory: denies cough, dyspnea, DOE, pleurisy, hoarseness, laryngitis, wheezing.  Gastrointestinal: Denies dysphagia, heartburn, reflux, water brash, pain, cramps, nausea, vomiting, bloating, diarrhea, constipation, hematemesis, melena, hematochezia, jaundice, hemorrhoids Genitourinary: Denies dysuria, frequency, urgency, nocturia, hesitancy, discharge, hematuria, flank pain Breast: Breast lumps, nipple  discharge, bleeding.  Musculoskeletal: Denies arthralgia, myalgia, stiffness, Jt. Swelling, pain, limp, and strain/sprain. Denies falls. Skin: Denies puritis, rash, hives, warts, acne, eczema, changing in skin lesion Neuro: No weakness, tremor, incoordination, spasms, paresthesia, pain Psychiatric: Denies confusion, memory loss, sensory loss. Denies Depression. Endocrine: Denies change in weight, skin, hair change, nocturia, and paresthesia, diabetic polys, visual blurring, hyper / hypo glycemic episodes.  Heme/Lymph: No excessive bleeding, bruising, enlarged lymph nodes.  Physical Exam  BP 108/82   Pulse 68   Temp (!) 97.4 F (36.3 C)   Resp 16   Ht 5' 4.25" (1.632 m)   Wt 165 lb (74.8 kg)   BMI 28.10 kg/m   General Appearance: Well nourished, well groomed and in no apparent distress.  Eyes: PERRLA, EOMs, conjunctiva no swelling or erythema, normal fundi and vessels. Sinuses: No frontal/maxillary tenderness ENT/Mouth: EACs patent / TMs  nl. Nares clear without erythema, swelling, mucoid exudates. Oral hygiene is good. No erythema, swelling, or exudate. Tongue normal, non-obstructing. Tonsils not swollen or erythematous. Hearing normal.  Neck: Supple, thyroid not palpable. No bruits, nodes or JVD. Respiratory: Respiratory effort normal.  BS equal and clear bilateral without rales, rhonci, wheezing or stridor. Cardio: Heart sounds are normal with regular rate and rhythm and no murmurs, rubs or gallops. Peripheral pulses are normal and equal bilaterally without edema. No aortic or femoral bruits. Chest: symmetric with normal excursions and percussion. Breasts: Symmetric, without lumps, nipple discharge, retractions, or fibrocystic changes.  Abdomen: Flat, soft with bowel sounds active. Nontender, no guarding, rebound, hernias, masses, or organomegaly.  Lymphatics: Non tender without lymphadenopathy.  Musculoskeletal: Full ROM all peripheral extremities, joint stability, 5/5 strength,  and normal gait. Skin: Warm and dry without rashes, lesions, cyanosis, clubbing or  ecchymosis.  Neuro: Cranial nerves intact, reflexes equal bilaterally. Normal muscle tone, no cerebellar symptoms. Sensation intact.  Pysch: Alert and oriented X 3, normal affect, Insight and Judgment appropriate.   Assessment and Plan  1. Annual Preventative Screening Examination  2. Essential hypertension  - EKG 12-Lead - Urinalysis, Routine w reflex microscopic - Microalbumin / creatinine urine ratio - CBC with Differential/Platelet - COMPLETE METABOLIC PANEL WITH GFR - Magnesium - TSH  3. Hyperlipidemia, mixed  - EKG 12-Lead - Lipid panel - TSH  4. Abnormal glucose  - EKG 12-Lead - Hemoglobin A1c - Insulin, random  5. Vitamin D deficiency  - VITAMIN D 25 Hydroxyl  6. Cardiac pacemaker  - EKG 12-Lead  7. Prediabetes  - EKG 12-Lead - Hemoglobin  A1c - Insulin, random  8. Gastroesophageal reflux disease  - CBC with Differential/Platelet  9. Screening for colorectal cancer  - POC Hemoccult Bld/Stl  10. Screening for ischemic heart disease  - EKG 12-Lead - Lipid panel  11. FHx: heart disease  - EKG 12-Lead  12. Former smoker  - EKG 12-Lead  13. Depression, controlled  14. Medication management  - Urinalysis, Routine w reflex microscopic - Microalbumin / creatinine urine ratio - CBC with Differential/Platelet - COMPLETE METABOLIC PANEL WITH GFR - Magnesium - Lipid panel - TSH - Hemoglobin A1c - Insulin, random - VITAMIN D 25 Hydroxyl       Patient was counseled in prudent diet to achieve/maintain BMI less than 25 for weight control, BP monitoring, regular exercise and medications. Discussed med's effects and SE's. Screening labs and tests as requested with regular follow-up as recommended. Over 40 minutes of exam, counseling, chart review and high complex critical decision making was performed.

## 2018-11-16 LAB — VITAMIN D 25 HYDROXY (VIT D DEFICIENCY, FRACTURES): Vit D, 25-Hydroxy: 52 ng/mL (ref 30–100)

## 2018-11-16 LAB — COMPLETE METABOLIC PANEL WITH GFR
AG Ratio: 1.6 (calc) (ref 1.0–2.5)
ALBUMIN MSPROF: 4.1 g/dL (ref 3.6–5.1)
ALT: 10 U/L (ref 6–29)
AST: 17 U/L (ref 10–35)
Alkaline phosphatase (APISO): 83 U/L (ref 33–130)
BUN: 12 mg/dL (ref 7–25)
CO2: 28 mmol/L (ref 20–32)
Calcium: 9.4 mg/dL (ref 8.6–10.4)
Chloride: 106 mmol/L (ref 98–110)
Creat: 0.8 mg/dL (ref 0.60–0.93)
GFR, Est African American: 82 mL/min/{1.73_m2} (ref >=60)
GFR, Est Non African American: 71 mL/min/{1.73_m2} (ref >=60)
GLOBULIN: 2.6 g/dL (ref 1.9–3.7)
Glucose, Bld: 85 mg/dL (ref 65–99)
Potassium: 4.4 mmol/L (ref 3.5–5.3)
Sodium: 142 mmol/L (ref 135–146)
Total Bilirubin: 0.4 mg/dL (ref 0.2–1.2)
Total Protein: 6.7 g/dL (ref 6.1–8.1)

## 2018-11-16 LAB — CBC WITH DIFFERENTIAL/PLATELET
Absolute Monocytes: 403 cells/uL (ref 200–950)
BASOS PCT: 1.1 %
Basophils Absolute: 53 cells/uL (ref 0–200)
Eosinophils Absolute: 154 cells/uL (ref 15–500)
Eosinophils Relative: 3.2 %
HCT: 41.1 % (ref 35.0–45.0)
Hemoglobin: 13.4 g/dL (ref 11.7–15.5)
Lymphs Abs: 1709 cells/uL (ref 850–3900)
MCH: 28.4 pg (ref 27.0–33.0)
MCHC: 32.6 g/dL (ref 32.0–36.0)
MCV: 87.1 fL (ref 80.0–100.0)
MPV: 10.7 fL (ref 7.5–12.5)
Monocytes Relative: 8.4 %
Neutro Abs: 2482 cells/uL (ref 1500–7800)
Neutrophils Relative %: 51.7 %
Platelets: 229 10*3/uL (ref 140–400)
RBC: 4.72 10*6/uL (ref 3.80–5.10)
RDW: 12.5 % (ref 11.0–15.0)
Total Lymphocyte: 35.6 %
WBC: 4.8 10*3/uL (ref 3.8–10.8)

## 2018-11-16 LAB — URINALYSIS, ROUTINE W REFLEX MICROSCOPIC
BILIRUBIN URINE: NEGATIVE
Bacteria, UA: NONE SEEN /HPF
GLUCOSE, UA: NEGATIVE
Hgb urine dipstick: NEGATIVE
Hyaline Cast: NONE SEEN /LPF
Ketones, ur: NEGATIVE
Nitrite: NEGATIVE
Protein, ur: NEGATIVE
Specific Gravity, Urine: 1.015 (ref 1.001–1.03)
Squamous Epithelial / HPF: NONE SEEN /HPF (ref <=5)
WBC, UA: NONE SEEN /HPF (ref 0–5)
pH: 7 (ref 5.0–8.0)

## 2018-11-16 LAB — HEMOGLOBIN A1C
EAG (MMOL/L): 6.8 (calc)
Hgb A1c MFr Bld: 5.9 % of total Hgb — ABNORMAL HIGH (ref <5.7)
Mean Plasma Glucose: 123 (calc)

## 2018-11-16 LAB — MICROALBUMIN / CREATININE URINE RATIO
Creatinine, Urine: 105 mg/dL (ref 20–275)
Microalb Creat Ratio: 6 mcg/mg creat (ref <30)
Microalb, Ur: 0.6 mg/dL

## 2018-11-16 LAB — LIPID PANEL
Cholesterol: 178 mg/dL (ref <200)
HDL: 56 mg/dL (ref >50)
LDL Cholesterol (Calc): 106 mg/dL (calc) — ABNORMAL HIGH
Non-HDL Cholesterol (Calc): 122 mg/dL (calc) (ref <130)
Total CHOL/HDL Ratio: 3.2 (calc) (ref <5.0)
Triglycerides: 73 mg/dL (ref <150)

## 2018-11-16 LAB — INSULIN, RANDOM: INSULIN: 3.8 u[IU]/mL (ref 2.0–19.6)

## 2018-11-16 LAB — MAGNESIUM: Magnesium: 2.2 mg/dL (ref 1.5–2.5)

## 2018-11-16 LAB — TSH: TSH: 1.22 mIU/L (ref 0.40–4.50)

## 2018-11-18 ENCOUNTER — Encounter: Payer: Self-pay | Admitting: Internal Medicine

## 2018-11-20 ENCOUNTER — Other Ambulatory Visit: Payer: Self-pay | Admitting: Internal Medicine

## 2018-11-20 DIAGNOSIS — R450 Nervousness: Secondary | ICD-10-CM

## 2018-11-20 MED ORDER — ALPRAZOLAM 1 MG PO TABS: ORAL_TABLET | ORAL | 0 refills | Status: DC

## 2018-11-23 DIAGNOSIS — M47812 Spondylosis without myelopathy or radiculopathy, cervical region: Secondary | ICD-10-CM | POA: Diagnosis not present

## 2018-11-29 DIAGNOSIS — M47812 Spondylosis without myelopathy or radiculopathy, cervical region: Secondary | ICD-10-CM | POA: Diagnosis not present

## 2018-11-30 LAB — CUP PACEART REMOTE DEVICE CHECK
Battery Remaining Longevity: 117 mo
Battery Remaining Percentage: 95.5 %
Battery Voltage: 2.96 V
Brady Statistic AP VS Percent: 1 %
Brady Statistic AS VP Percent: 26 %
Brady Statistic AS VS Percent: 1 %
Brady Statistic RA Percent Paced: 72 %
Date Time Interrogation Session: 20191204075255
Implantable Lead Implant Date: 20140805
Implantable Lead Implant Date: 20140805
Implantable Lead Location: 753859
Implantable Lead Location: 753860
Implantable Lead Model: 1944
Implantable Lead Model: 1948
Implantable Pulse Generator Implant Date: 20140805
Lead Channel Impedance Value: 550 Ohm
Lead Channel Impedance Value: 630 Ohm
Lead Channel Pacing Threshold Amplitude: 0.5 V
Lead Channel Pacing Threshold Amplitude: 0.75 V
Lead Channel Pacing Threshold Pulse Width: 0.4 ms
Lead Channel Pacing Threshold Pulse Width: 0.4 ms
Lead Channel Sensing Intrinsic Amplitude: 12 mV
Lead Channel Sensing Intrinsic Amplitude: 2.2 mV
Lead Channel Setting Pacing Amplitude: 1 V
Lead Channel Setting Pacing Amplitude: 2 V
Lead Channel Setting Pacing Pulse Width: 0.4 ms
Lead Channel Setting Sensing Sensitivity: 4 mV
MDC IDC PG SERIAL: 7523768
MDC IDC STAT BRADY AP VP PERCENT: 73 %
MDC IDC STAT BRADY RV PERCENT PACED: 99 %
Pulse Gen Model: 2240

## 2018-12-10 ENCOUNTER — Encounter: Payer: Self-pay | Admitting: Internal Medicine

## 2018-12-10 ENCOUNTER — Ambulatory Visit: Payer: Medicare PPO | Admitting: Internal Medicine

## 2018-12-10 VITALS — BP 104/68 | HR 64 | Ht 65.0 in | Wt 163.8 lb

## 2018-12-10 DIAGNOSIS — I480 Paroxysmal atrial fibrillation: Secondary | ICD-10-CM

## 2018-12-10 DIAGNOSIS — Z950 Presence of cardiac pacemaker: Secondary | ICD-10-CM | POA: Diagnosis not present

## 2018-12-10 DIAGNOSIS — I10 Essential (primary) hypertension: Secondary | ICD-10-CM

## 2018-12-10 DIAGNOSIS — I442 Atrioventricular block, complete: Secondary | ICD-10-CM | POA: Diagnosis not present

## 2018-12-10 NOTE — Patient Instructions (Signed)
Medication Instructions:  Your physician recommends that you continue on your current medications as directed. Please refer to the Current Medication list given to you today.  Labwork: None ordered.  Testing/Procedures: None ordered.  Follow-Up: Your physician wants you to follow-up in: one year with Francis Dowse, PA.   You will receive a reminder letter in the mail two months in advance. If you don't receive a letter, please call our office to schedule the follow-up appointment.  Remote monitoring is used to monitor your Pacemaker from home. This monitoring reduces the number of office visits required to check your device to one time per year. It allows [place] to keep an eye on the functioning of your device to ensure it is working properly. You are scheduled for a device check from home on 01/09/2019. You may send your transmission at any time that day. If you have a wireless device, the transmission will be sent automatically. After your physician reviews your transmission, you will receive a postcard with your next transmission date.  Any Other Special Instructions Will Be Listed Below (If Applicable).  If you need a refill on your cardiac medications before your next appointment, please call your pharmacy.

## 2018-12-10 NOTE — Progress Notes (Signed)
PCP: Lucky Cowboy, MD Primary Cardiologist: Dr Shirlee Latch Primary EP:  Dr Johney Frame  Alexandra Wheeler is a 78 y.o. female who presents today for routine electrophysiology followup.  Since last being seen in our clinic, the patient reports doing very well.  She had asymptomatic afib 10/04/2018.  She was started on eliquis by the AF clinic.  No further afib.  Today, she denies symptoms of palpitations, chest pain, shortness of breath,  lower extremity edema, dizziness, presyncope, or syncope.  The patient is otherwise without complaint today.   Past Medical History:  Diagnosis Date  . Anxiety   . Atrophic vaginitis   . Chest pain    a. Adenosine sestamibi (1/14) with no evidence of ischemia or infarction, EF 63%.   . Colon polyps   . Complete heart block (HCC) 06-2013   status post pacemaker implantation by Dr Johney Frame 06-11-2013  . Depression   . DI (detrusor instability)   . GERD (gastroesophageal reflux disease)    History + Hyplori via EGD  . H/O echocardiogram    a. Echo 11/2012:EF 50-55%, focal basal septal hypertrophy with some mitral valve SAM (mild) and MR.  This may be a sigmoid septum with advanced age or could be a variant of hypertrophic cardiomyopathy.  Marland Kitchen HTN (hypertension)   . Hyperlipidemia   . LBBB (left bundle branch block)   . Leukopenia 05/19/2014  . Lymphocytosis 05/19/2014  . Migraines   . Osteoporosis   . Prediabetes   . Vitamin D deficiency    Past Surgical History:  Procedure Laterality Date  . BREAST EXCISIONAL BIOPSY Right   . BREAST SURGERY     Breast cyst  . HEMORRHOID SURGERY    . LEFT HEART CATHETERIZATION WITH CORONARY ANGIOGRAM N/A 06/11/2013   Procedure: LEFT HEART CATHETERIZATION WITH CORONARY ANGIOGRAM;  Surgeon: Kathleene Hazel, MD;  Location[facility] LAB;  Service: Cardiovascular;  Laterality: N/A;  . PACEMAKER INSERTION  06-11-2013   STJ dual chamber pacemaker implanted by Dr Johney Frame for complete heart block  . PERMANENT PACEMAKER INSERTION  Left 06/11/2013   Procedure: PERMANENT PACEMAKER INSERTION;  Surgeon: Kathleene Hazel, MD;  Location[facility] LAB;  Service: Cardiovascular;  Laterality: Left;  . PERMANENT PACEMAKER INSERTION N/A 06/11/2013   Procedure: PERMANENT PACEMAKER INSERTION;  Surgeon: Hillis Range, MD;  Location: [facility]ATH LAB;  Service: Cardiovascular;  Laterality: N/A;  . TEMPORARY PACEMAKER INSERTION N/A 06/11/2013   Procedure: TEMPORARY PACEMAKER INSERTION;  Surgeon: Kathleene Hazel, MD;  Location: [facility] CATH LAB;  Service: Cardiovascular;  Laterality: N/A;  . TUBAL LIGATION      ROS- all systems are reviewed and negative except as per HPI above  Current Outpatient Medications  Medication Sig Dispense Refill  . ALPRAZolam (XANAX) 1 MG tablet Take 1/2 - 1 tablet 2 - 3 x /day ONLY if needed for Anxiety Attack &  limit to 5 days /week to avoid addiction 90 tablet 0  . Ascorbic Acid (VITAMIN C PO) Take 1 tablet by mouth daily.     Marland Kitchen BIOTIN PO Take 1 tablet by mouth daily.     . Calcium Carbonate-Vitamin D (CALCIUM + D PO) Take 1 tablet by mouth daily.     . Cholecalciferol (VITAMIN D PO) Take 5,000 Units by mouth daily.     . citalopram (CELEXA) 20 MG tablet TAKE 1 TABLET EVERY DAY  (NEW  DOSE) 90 tablet 3  . Coenzyme Q10 (CO Q 10 PO) Take 1 capsule by mouth daily.    Marland Kitchen  ELIQUIS 5 MG TABS tablet TAKE 1 TABLET BY MOUTH 2 TIMES DAILY 60 tablet 0  . fluticasone (FLONASE) 50 MCG/ACT nasal spray Place 2 sprays into both nostrils daily. (Patient taking differently: Place 2 sprays into both nostrils daily as needed for allergies or rhinitis. ) 16 g 0  . FOLIC ACID PO Take 1 tablet by mouth daily.     . hydrochlorothiazide (HYDRODIURIL) 25 MG tablet Take 25 mg by mouth daily as needed (swelling).     . hydrocortisone (PROCTOZONE-HC) 2.5 % rectal cream PLACE 1 APPLICATION RECTALLY 2 (TWO) TIMES DAILY. 28.35 g 5  . MAGNESIUM PO Take 500 mg by mouth daily.     . metoprolol succinate (TOPROL-XL) 50 MG 24 hr tablet Take 1  tablet daily for BP 15 tablet 0  . Multiple Vitamin (MULTIVITAMIN) tablet Take 1 tablet by mouth daily.    . ranitidine (ZANTAC) 300 MG tablet Take 300 mg by mouth daily as needed for heartburn.    . Tretinoin, Facial Wrinkles, (TRETINOIN, EMOLLIENT,) 0.05 % CREA APPLY TO AFFECTED AREA EVERY DAY AS DIRECTED 40 g 1   No current facility-administered medications for this visit.     Physical Exam: Vitals:   12/10/18 1532  BP: 104/68  Pulse: 64  SpO2: 95%  Weight: 163 lb 12.8 oz (74.3 kg)  Height: 5\' 5"  (1.651 m)    GEN- The patient is well appearing, alert and oriented x 3 today.   Head- normocephalic, atraumatic Eyes-  Sclera clear, conjunctiva pink Ears- hearing intact Oropharynx- clear Lungs- Clear to ausculation bilaterally, normal work of breathing Chest- pacemaker pocket is well healed Heart- Regular rate and rhythm, no murmurs, rubs or gallops, PMI not laterally displaced GI- soft, NT, ND, + BS Extremities- no clubbing, cyanosis, or edema  Pacemaker interrogation- reviewed in detail today,  See PACEART report    Assessment and Plan:  1. Symptomatic  complete heart block Normal pacemaker function See Pace Art report No changes today Programmed DDIR previously due frequent PMT previously.  She quickly returned due to worsening symptoms and was returned to DDDR.  She is doing well, without rare PMT. She is chronically 100% V paced  2. HTN Stable No change required today  3. afib Recently diagnosed on PPM remote chads2vasc score is 5. She is on eliquis afib burden is <1 % Could consider stopping eliquis on return if no further AF occurs  Merlin Return to see EP PA in a year   Hillis RangeJames Eddie Koc MD, [facility] 12/10/2018 3:39 PM

## 2018-12-11 LAB — CUP PACEART INCLINIC DEVICE CHECK
Brady Statistic RA Percent Paced: 74 %
Brady Statistic RV Percent Paced: 99.11 %
Implantable Lead Implant Date: 20140805
Implantable Lead Implant Date: 20140805
Implantable Lead Location: 753859
Implantable Lead Location: 753860
Implantable Lead Model: 1944
Implantable Lead Model: 1948
Implantable Pulse Generator Implant Date: 20140805
Lead Channel Impedance Value: 575 Ohm
Lead Channel Impedance Value: 637.5 Ohm
Lead Channel Pacing Threshold Amplitude: 0.5 V
Lead Channel Pacing Threshold Amplitude: 0.75 V
Lead Channel Pacing Threshold Pulse Width: 0.2 ms
Lead Channel Pacing Threshold Pulse Width: 0.2 ms
Lead Channel Pacing Threshold Pulse Width: 0.4 ms
Lead Channel Sensing Intrinsic Amplitude: 12 mV
Lead Channel Sensing Intrinsic Amplitude: 2.5 mV
Lead Channel Setting Pacing Amplitude: 1 V
Lead Channel Setting Pacing Amplitude: 2 V
Lead Channel Setting Pacing Pulse Width: 0.4 ms
Lead Channel Setting Sensing Sensitivity: 4 mV
MDC IDC MSMT BATTERY REMAINING LONGEVITY: 112 mo
MDC IDC MSMT BATTERY VOLTAGE: 2.96 V
MDC IDC MSMT LEADCHNL RA PACING THRESHOLD AMPLITUDE: 0.5 V
MDC IDC SESS DTM: 20200203202917
Pulse Gen Model: 2240
Pulse Gen Serial Number: 7523768

## 2019-01-07 ENCOUNTER — Encounter: Payer: Medicare PPO | Admitting: Internal Medicine

## 2019-01-08 DIAGNOSIS — M79606 Pain in leg, unspecified: Secondary | ICD-10-CM | POA: Diagnosis not present

## 2019-01-08 DIAGNOSIS — M47816 Spondylosis without myelopathy or radiculopathy, lumbar region: Secondary | ICD-10-CM | POA: Diagnosis not present

## 2019-01-08 DIAGNOSIS — M47812 Spondylosis without myelopathy or radiculopathy, cervical region: Secondary | ICD-10-CM | POA: Diagnosis not present

## 2019-01-09 ENCOUNTER — Ambulatory Visit (INDEPENDENT_AMBULATORY_CARE_PROVIDER_SITE_OTHER): Payer: Medicare PPO | Admitting: *Deleted

## 2019-01-09 DIAGNOSIS — I442 Atrioventricular block, complete: Secondary | ICD-10-CM

## 2019-01-09 LAB — CUP PACEART REMOTE DEVICE CHECK
Battery Remaining Longevity: 117 mo
Battery Remaining Percentage: 95.5 %
Battery Voltage: 2.96 V
Brady Statistic AP VP Percent: 83 %
Brady Statistic AP VS Percent: 1 %
Brady Statistic AS VP Percent: 16 %
Brady Statistic AS VS Percent: 1 %
Brady Statistic RA Percent Paced: 83 %
Brady Statistic RV Percent Paced: 99 %
Date Time Interrogation Session: 20200304103423
Implantable Lead Implant Date: 20140805
Implantable Lead Location: 753859
Implantable Lead Model: 1948
Implantable Pulse Generator Implant Date: 20140805
Lead Channel Impedance Value: 600 Ohm
Lead Channel Pacing Threshold Amplitude: 0.5 V
Lead Channel Pacing Threshold Amplitude: 0.875 V
Lead Channel Pacing Threshold Pulse Width: 0.2 ms
Lead Channel Pacing Threshold Pulse Width: 0.4 ms
Lead Channel Sensing Intrinsic Amplitude: 1.6 mV
Lead Channel Sensing Intrinsic Amplitude: 12 mV
Lead Channel Setting Pacing Amplitude: 1.125
Lead Channel Setting Pacing Amplitude: 2 V
Lead Channel Setting Pacing Pulse Width: 0.4 ms
Lead Channel Setting Sensing Sensitivity: 4 mV
MDC IDC LEAD IMPLANT DT: 20140805
MDC IDC LEAD LOCATION: 753860
MDC IDC MSMT LEADCHNL RA IMPEDANCE VALUE: 510 Ohm
MDC IDC PG SERIAL: 7523768
Pulse Gen Model: 2240

## 2019-01-16 ENCOUNTER — Encounter: Payer: Self-pay | Admitting: Cardiology

## 2019-01-16 NOTE — Progress Notes (Signed)
Remote pacemaker transmission.   

## 2019-01-17 ENCOUNTER — Telehealth: Payer: Self-pay | Admitting: Nurse Practitioner

## 2019-01-17 ENCOUNTER — Telehealth: Payer: Self-pay | Admitting: *Deleted

## 2019-01-17 ENCOUNTER — Other Ambulatory Visit: Payer: Self-pay | Admitting: Physical Medicine and Rehabilitation

## 2019-01-17 DIAGNOSIS — M47812 Spondylosis without myelopathy or radiculopathy, cervical region: Secondary | ICD-10-CM

## 2019-01-17 NOTE — Telephone Encounter (Signed)
Patient with diagnosis of afib on Eliquis for anticoagulation.    Procedure: MYELOGRAM  Date of procedure: TBD  CHADS2-VASc score of  4 (CHF, HTN, AGE, DM2, stroke/tia x 2, CAD, AGE, female)  CrCl 58  Per office protocol, patient can hold Eliquis for 3 days prior to procedure.

## 2019-01-17 NOTE — Telephone Encounter (Signed)
   Primary Cardiologist: No primary care provider on file.  Chart reviewed as part of pre-operative protocol coverage. Patient was contacted 01/17/2019 in reference to pre-operative risk assessment for pending surgery as outlined below.  Alexandra Wheeler was last seen on 12/10/18 by Dr. Johney Frame.  Since that day, Alexandra Wheeler has done well -no chest pain and no SOB.  She is active. She has a permanent pacemaker.     She should hold Eliquis for 3 days prior to procedure and resume afterwards when safe to do so.  Therefore, based on ACC/AHA guidelines, the patient would be at acceptable risk for the planned procedure without further cardiovascular testing.   I will route this recommendation to the requesting party via Epic fax function and remove from pre-op pool.  Please call with questions.  Nada Boozer, NP 01/17/2019, 4:14 PM

## 2019-01-17 NOTE — Telephone Encounter (Signed)
Phone call to patient to verify medication list and allergies for myelogram procedure. Pt instructed to hold Celexa for 48hrs prior to myelogram appointment time. Pt also aware she will need to hold Eliquis for 48hrs (pending approval from cardiologist). Pt verbalized understanding. Faxed thinner hold request, awaiting reply and further recommendations.

## 2019-01-17 NOTE — Telephone Encounter (Signed)
   [facility] Pre-operative Risk Assessment    Request for surgical clearance:  1. What type of surgery is being performed? MYELOGRAM   2. When is this surgery scheduled? TBD   3. What type of clearance is required (medical clearance vs. Pharmacy clearance to hold med vs. Both)? BOTH  4. Are there any medications that need to be held prior to surgery and how long?ELIQUIS FOR 48 HOURS PRIOR   5. Practice name and name of physician performing surgery? [facility]; Lavenia Atlas, RN   6. What is your office phone number [phone]    7.   What is your office fax number [phone]  8.   Anesthesia type (None, local, MAC, general) ? LOCAL   Julaine Hua 01/17/2019, 3:15 PM  _________________________________________________________________   (provider comments below)

## 2019-01-30 ENCOUNTER — Other Ambulatory Visit: Payer: Medicare PPO

## 2019-01-30 ENCOUNTER — Other Ambulatory Visit: Payer: Self-pay | Admitting: Internal Medicine

## 2019-02-25 DIAGNOSIS — K219 Gastro-esophageal reflux disease without esophagitis: Secondary | ICD-10-CM | POA: Insufficient documentation

## 2019-02-25 DIAGNOSIS — I480 Paroxysmal atrial fibrillation: Secondary | ICD-10-CM | POA: Insufficient documentation

## 2019-02-25 DIAGNOSIS — Z950 Presence of cardiac pacemaker: Secondary | ICD-10-CM | POA: Insufficient documentation

## 2019-02-25 DIAGNOSIS — Z7901 Long term (current) use of anticoagulants: Secondary | ICD-10-CM | POA: Insufficient documentation

## 2019-02-25 DIAGNOSIS — F330 Major depressive disorder, recurrent, mild: Secondary | ICD-10-CM | POA: Insufficient documentation

## 2019-02-25 NOTE — Progress Notes (Signed)
Virtual Visit via Video Note  I connected with Alexandra Findersudrey F Niazi on 02/26/19 at 11:15 AM EDT by a video enabled telemedicine application and verified that I am speaking with the correct person using two identifiers.   I discussed the limitations of evaluation and management by telemedicine and the availability of in person appointments. The patient expressed understanding and agreed to proceed.   I discussed the assessment and treatment plan with the patient. The patient was provided an opportunity to ask questions and all were answered. The patient agreed with the plan and demonstrated an understanding of the instructions.   The patient was advised to call back or seek an in-person evaluation if the symptoms worsen or if the condition fails to improve as anticipated.  I provided 32 minutes of non-face-to-face time during this encounter.   Dan MakerAshley C Altha Sweitzer, NP     MEDICARE ANNUAL WELLNESS VISIT AND FOLLOW UP  Assessment:   Diagnoses and all orders for this visit:  Encounter for Medicare annual wellness exam Continue annually   LBBB (left bundle branch block) Followed by cardiology; s/p pacemaker  Hypertrophic obstructive cardiomyopathy (HCC) Weights stable; followed by cardiology; manage BP  Essential hypertension Continue medications Monitor blood pressure at home; call if consistently over 130/80 Continue DASH diet.   Reminder to go to the ER if any CP, SOB, nausea, dizziness, severe HA, changes vision/speech, left arm numbness and tingling and jaw pain.  CHB (complete heart block) (HCC) S/p pacemaker; followed by cardiology  Major depressive disorder, recurrent episode, mild with anxious distress (HCC) Continue medications; fairly controlled; reminded to avoid daily benzo use; she is working on cutting down  Lifestyle discussed: diet/exerise, sleep hygiene, stress management, hydration  Age-related osteoporosis without current pathological fracture - get dexa fall  2020, continue Vit D and Ca, emphasized weight bearing exercises Couldn't tolerate bisphosphonates  Atrophic vaginitis Monitor; topicals PRN  Paroxysmal atrial fibrillation (HCC) No concerns with excessive bleeding, no falls Continue medication: elequis Followed by cardiology  Vitamin D deficiency Below goal last visit; she did increase dose continue to recommend supplementation for goal of 60-100 Check vitamin D level annually or as needed  Other abnormal glucose She is working on weight loss Discussed disease and risks Discussed diet/exercise, weight management  A1C, insulin level routinely   Medication management CBC, CMP at 3mOV  Mixed hyperlipidemia Mild elevations not on meds; working on lifestyle Continue low cholesterol diet and exercise.  Check lipid panel.   Gastroesophageal reflux disease, esophagitis presence not specified Well managed by lifestyle at this time Discussed diet, avoiding triggers and other lifestyle changes  Pacemaker Followed by Cardiology  Chronic anticoagulation No signs of excessive bleeding; reminded to present to ED for any trauma or head injuries   Over 40 minutes of exam, counseling, chart review and critical decision making was performed Future Appointments  Date Time Provider Department Center  04/10/2019  7:15 AM [facility] DEVICE REMOTES [facility]  05/29/2019 11:00 AM Lucky CowboyMcKeown, William, MD [facility] None  12/03/2019  9:00 AM Lucky CowboyMcKeown, William, MD [facility] None     Plan:   During the course of the visit the patient was educated and counseled about appropriate screening and preventive services including:    Pneumococcal vaccine   Prevnar 13  Influenza vaccine  Td vaccine  Screening electrocardiogram  Bone densitometry screening  Colorectal cancer screening  Diabetes screening  Glaucoma screening  Nutrition counseling   Advanced directives: requested   Subjective:  Alexandra Wheeler is a 78  y.o.  female who presents for Medicare Annual Wellness Visit and 3 month follow up.   In 2014, she had a PPM implanted for CHB. Last year she was diagnosed w/Afib and started on Eliquis. She has an echocardiogram that suggests hypertrophic cardiomyopathy with asymmetric septal hypertrophy and mitral valve SAM with mild MR. She had negative cath. Followed by Dr. Lauretta Grill and newly with a. Fib clinic. She denies CP, palpitations, fatigue, exercise intolerance, edema.   She is following with Dr. Regino Schultze for chronic pain, she has headaches related to cervical arthritis, getting injections that were initially helpful but not recently, pending myelogram post covid 19.   she has a diagnosis of depression/anxiety and is currently on lexapro 20 mg daily, PRN xanax (0.5-1 mg TID), reports symptoms are well controlled on current regimen. she is currently using xanax 0.5-3/4 tab in the evening. She is trying to avoid taking daily.   BMI is There is no height or weight on file to calculate BMI., she has been working on diet, cutting starches and sweets, exercise limited due to chronic pain.   Wt Readings from Last 3 Encounters:  12/10/18 163 lb 12.8 oz (74.3 kg)  11/15/18 165 lb (74.8 kg)  10/15/18 165 lb (74.8 kg)    Her blood pressure has been controlled at home, today their BP is BP: 122/85 She does not workout. She denies chest pain, shortness of breath, dizziness.   She is not on cholesterol medication and denies myalgias. Her cholesterol is not at goal. The cholesterol last visit was:   Lab Results  Component Value Date   CHOL 178 11/15/2018   HDL 56 11/15/2018   LDLCALC 106 (H) 11/15/2018   TRIG 73 11/15/2018   CHOLHDL 3.2 11/15/2018    She has been working on diet and exercise for prediabetes, and denies increased appetite, nausea, paresthesia of the feet, polydipsia, polyuria and visual disturbances. Last A1C in the office was:  Lab Results  Component Value Date   HGBA1C 5.9 (H) 11/15/2018    Last GFR: Lab Results  Component Value Date   GFRNONAA 71 11/15/2018   Patient is on Vitamin D supplement and near goal of 60 at recent check; she has increased up to 1000 IU daily   Lab Results  Component Value Date   VD25OH 52 11/15/2018      Medication Review: Current Outpatient Medications on File Prior to Visit  Medication Sig Dispense Refill  . Ascorbic Acid (VITAMIN C PO) Take 1 tablet by mouth daily.     Marland Kitchen BIOTIN PO Take 1 tablet by mouth daily.     . Calcium Carbonate-Vitamin D (CALCIUM + D PO) Take 1 tablet by mouth daily.     . Cholecalciferol (VITAMIN D PO) Take 5,000 Units by mouth daily.     . citalopram (CELEXA) 20 MG tablet TAKE 1 TABLET EVERY DAY  (NEW  DOSE) 90 tablet 3  . ELIQUIS 5 MG TABS tablet TAKE 1 TABLET TWICE DAILY 180 tablet 1  . fluticasone (FLONASE) 50 MCG/ACT nasal spray Place 2 sprays into both nostrils daily. (Patient taking differently: Place 2 sprays into both nostrils daily as needed for allergies or rhinitis. ) 16 g 0  . FOLIC ACID PO Take 1 tablet by mouth daily.     . hydrochlorothiazide (HYDRODIURIL) 25 MG tablet Take 25 mg by mouth daily as needed (swelling).     . hydrocortisone (PROCTOZONE-HC) 2.5 % rectal cream PLACE 1 APPLICATION RECTALLY 2 (TWO) TIMES DAILY. 28.35 g 5  .  MAGNESIUM PO Take 500 mg by mouth daily.     . metoprolol succinate (TOPROL-XL) 50 MG 24 hr tablet Take 1 tablet daily for BP 15 tablet 0  . Multiple Vitamin (MULTIVITAMIN) tablet Take 1 tablet by mouth daily.    . Tretinoin, Facial Wrinkles, (TRETINOIN, EMOLLIENT,) 0.05 % CREA APPLY TO AFFECTED AREA EVERY DAY AS DIRECTED 40 g 1  . Coenzyme Q10 (CO Q 10 PO) Take 1 capsule by mouth daily.     No current facility-administered medications on file prior to visit.     Allergies  Allergen Reactions  . Actonel [Risedronate Sodium]     Pt didn't like the way it made her feel   . Boniva [Ibandronic Acid]     Pt didn't like the way it made her feel  . Evista [Raloxifene]      Pt didn't like the way it made her feel  . Fosamax [Alendronate Sodium]     Pt didn't like the way it made her feel   . Levsin [Hyoscyamine Sulfate]     unknown  . Mobic [Meloxicam]     unknown   . Topamax [Topiramate]     unknown   . Zoloft [Sertraline Hcl]     unknown     Current Problems (verified) Patient Active Problem List   Diagnosis Date Noted  . GERD (gastroesophageal reflux disease) 02/25/2019  . Major depressive disorder, recurrent episode, mild with anxious distress (HCC) 02/25/2019  . Pacemaker 02/25/2019  . Paroxysmal atrial fibrillation (HCC) 02/25/2019  . Chronic anticoagulation 02/25/2019  . Hypertrophic obstructive cardiomyopathy (HCC) 09/21/2015  . Overweight (BMI 25.0-29.9) 09/01/2015  . Medication management 08/22/2014  . Vitamin D deficiency   . Other abnormal glucose (prediabetes)   . HTN (hypertension)   . Hyperlipidemia   . CHB (complete heart block) (HCC) 06/10/2013  . LBBB (left bundle branch block) 11/15/2012  . Osteoporosis   . DI (detrusor instability)   . Atrophic vaginitis     Screening Tests Immunization History  Administered Date(s) Administered  . Influenza Split 08/06/2014, 09/20/2017  . Influenza, High Dose Seasonal PF 09/01/2015  . Influenza-Unspecified 09/12/2018  . Pneumococcal Conjugate-13 09/01/2015  . Pneumococcal-Unspecified 10/08/2010  . Td 05/09/2011  . Zoster 05/09/2011    Preventative care: Last colonoscopy: 11/2013 Last mammogram: 07/2018 Last pap smear/pelvic exam: 10/2013 DEXA: 06/2017 - spine T -2.7, couldn't tolerate bisphosophonates  Prior vaccinations: TD or Tdap: 2012 Influenza: 2019  Pneumococcal: 2011 Prevnar13: 2016  Shingles/Zostavax: 2012  Names of Other Physician/Practitioners you currently use: 1. [facility] here for primary care 2. Dr. Dione Booze, eye doctor, last visit 2019, had L cataract, R mild 3. Dr. Cain Saupe, dentist, last visit 2020  Patient  Care Team: Lucky Cowboy, MD as PCP - General (Internal Medicine) Hillis Range, MD as PCP - Cardiology (Cardiology) Lucky Cowboy, MD as Attending Physician (Internal Medicine) Hillis Range, MD as Consulting Physician (Cardiology) Artis Delay, MD as Consulting Physician (Hematology and Oncology) Bernette Redbird, MD as Consulting Physician (Gastroenterology)  SURGICAL HISTORY She  has a past surgical history that includes Breast surgery; Tubal ligation; Hemorrhoid surgery; Pacemaker insertion (06-11-2013); left heart catheterization with coronary angiogram (N/A, 06/11/2013); temporary pacemaker insertion (N/A, 06/11/2013); permanent pacemaker insertion (Left, 06/11/2013); permanent pacemaker insertion (N/A, 06/11/2013); and Breast excisional biopsy (Right). FAMILY HISTORY Her family history includes Aneurysm in her brother; Breast cancer (age of onset: 23) in her sister; Cancer in her father; Diabetes in her brother, brother, and mother; Heart disease in her  brother, brother, and mother; Hypertension in her brother; Stroke in her brother. SOCIAL HISTORY She  reports that she quit smoking about 43 years ago. Her smoking use included cigarettes. She has never used smokeless tobacco. She reports that she does not drink alcohol or use drugs.   MEDICARE WELLNESS OBJECTIVES: Physical activity: Current Exercise Habits: The patient does not participate in regular exercise at present, Exercise limited by: orthopedic condition(s);neurologic condition(s) Cardiac risk factors: Cardiac Risk Factors include: advanced age (>27men, >46 women);dyslipidemia;hypertension;sedentary lifestyle Depression/mood screen:   Depression screen [facility] 2/9 02/26/2019  Decreased Interest 0  Down, Depressed, Hopeless 0  PHQ - 2 Score 0  Altered sleeping -  Tired, decreased energy -  Change in appetite -  Feeling bad or failure about yourself  -  Trouble concentrating -  Moving slowly or fidgety/restless -  Suicidal thoughts -   PHQ-9 Score -  Difficult doing work/chores -    ADLs:  In your present state of health, do you have any difficulty performing the following activities: 02/26/2019 11/18/2018  Hearing? N N  Vision? N N  Difficulty concentrating or making decisions? N N  Walking or climbing stairs? N N  Dressing or bathing? N N  Doing errands, shopping? N N  Some recent data might be hidden     Cognitive Testing  Alert? Yes  Normal Appearance?Yes  Oriented to person? Yes  Place? Yes   Time? Yes  Recall of three objects?  Yes  Can perform simple calculations? Yes  Displays appropriate judgment?Yes  Can read the correct time from a watch face?Yes  EOL planning: Does Patient Have a Medical Advance Directive?: Yes Type of Advance Directive: Healthcare Power of Attorney, Living will Does patient want to make changes to medical advance directive?: No - Patient declined Copy of Healthcare Power of Attorney in Chart?: No - copy requested  Review of Systems  Constitutional: Negative for malaise/fatigue and weight loss.  HENT: Negative for hearing loss and tinnitus.   Eyes: Negative for blurred vision and double vision.  Respiratory: Negative for cough, sputum production, shortness of breath and wheezing.   Cardiovascular: Negative for chest pain, palpitations, orthopnea, claudication, leg swelling and PND.  Gastrointestinal: Negative for abdominal pain, blood in stool, constipation, diarrhea, heartburn, melena, nausea and vomiting.  Genitourinary: Negative.   Musculoskeletal: Negative for falls, joint pain and myalgias.  Skin: Negative for rash.  Neurological: Negative for dizziness, tingling, sensory change, weakness and headaches.  Endo/Heme/Allergies: Negative for polydipsia.  Psychiatric/Behavioral: Negative for depression, memory loss, substance abuse and suicidal ideas. The patient is nervous/anxious and has insomnia.   All other systems reviewed and are negative.    Objective:     Today's  Vitals   02/26/19 1135  BP: 122/85  Pulse: 64   There is no height or weight on file to calculate BMI.  General : Well sounding patient in no apparent distress HEENT: no hoarseness, no cough for duration of visit Lungs: speaks in complete sentences, no audible wheezing, no apparent distress Neurological: alert, oriented x 3 Psychiatric: pleasant, judgement appropriate    Medicare Attestation I have personally reviewed: The patient's medical and social history Their use of alcohol, tobacco or illicit drugs Their current medications and supplements The patient's functional ability including ADLs,fall risks, home safety risks, cognitive, and hearing and visual impairment Diet and physical activities Evidence for depression or mood disorders  The patient's weight, height, BMI, and visual acuity have been recorded in the chart.  I have made referrals,  counseling, and provided education to the patient based on review of the above and I have provided the patient with a written personalized care plan for preventive services.     Dan Maker, NP   02/26/2019

## 2019-02-26 ENCOUNTER — Encounter: Payer: Self-pay | Admitting: Adult Health

## 2019-02-26 ENCOUNTER — Other Ambulatory Visit: Payer: Self-pay

## 2019-02-26 ENCOUNTER — Ambulatory Visit: Payer: Medicare PPO | Admitting: Adult Health

## 2019-02-26 VITALS — BP 122/85 | HR 64

## 2019-02-26 DIAGNOSIS — R6889 Other general symptoms and signs: Secondary | ICD-10-CM | POA: Diagnosis not present

## 2019-02-26 DIAGNOSIS — R7309 Other abnormal glucose: Secondary | ICD-10-CM

## 2019-02-26 DIAGNOSIS — F330 Major depressive disorder, recurrent, mild: Secondary | ICD-10-CM | POA: Diagnosis not present

## 2019-02-26 DIAGNOSIS — Z0001 Encounter for general adult medical examination with abnormal findings: Secondary | ICD-10-CM | POA: Diagnosis not present

## 2019-02-26 DIAGNOSIS — I421 Obstructive hypertrophic cardiomyopathy: Secondary | ICD-10-CM | POA: Diagnosis not present

## 2019-02-26 DIAGNOSIS — R450 Nervousness: Secondary | ICD-10-CM

## 2019-02-26 DIAGNOSIS — K219 Gastro-esophageal reflux disease without esophagitis: Secondary | ICD-10-CM

## 2019-02-26 DIAGNOSIS — Z0000 Encounter for general adult medical examination without abnormal findings: Secondary | ICD-10-CM

## 2019-02-26 DIAGNOSIS — I442 Atrioventricular block, complete: Secondary | ICD-10-CM

## 2019-02-26 DIAGNOSIS — N952 Postmenopausal atrophic vaginitis: Secondary | ICD-10-CM

## 2019-02-26 DIAGNOSIS — Z79899 Other long term (current) drug therapy: Secondary | ICD-10-CM

## 2019-02-26 DIAGNOSIS — M810 Age-related osteoporosis without current pathological fracture: Secondary | ICD-10-CM | POA: Diagnosis not present

## 2019-02-26 DIAGNOSIS — E782 Mixed hyperlipidemia: Secondary | ICD-10-CM

## 2019-02-26 DIAGNOSIS — I10 Essential (primary) hypertension: Secondary | ICD-10-CM

## 2019-02-26 DIAGNOSIS — I480 Paroxysmal atrial fibrillation: Secondary | ICD-10-CM

## 2019-02-26 DIAGNOSIS — E559 Vitamin D deficiency, unspecified: Secondary | ICD-10-CM

## 2019-02-26 DIAGNOSIS — Z7901 Long term (current) use of anticoagulants: Secondary | ICD-10-CM

## 2019-02-26 DIAGNOSIS — I447 Left bundle-branch block, unspecified: Secondary | ICD-10-CM

## 2019-02-26 DIAGNOSIS — Z950 Presence of cardiac pacemaker: Secondary | ICD-10-CM

## 2019-02-26 MED ORDER — ALPRAZOLAM 1 MG PO TABS: ORAL_TABLET | ORAL | 0 refills | Status: DC

## 2019-02-27 ENCOUNTER — Other Ambulatory Visit: Payer: Medicare PPO

## 2019-04-04 ENCOUNTER — Other Ambulatory Visit: Payer: Self-pay | Admitting: Internal Medicine

## 2019-04-04 DIAGNOSIS — I10 Essential (primary) hypertension: Secondary | ICD-10-CM

## 2019-04-08 ENCOUNTER — Ambulatory Visit
Admission: RE | Admit: 2019-04-08 | Discharge: 2019-04-08 | Disposition: A | Discharge status: Home / Self Care | Payer: Medicare PPO | Source: Ambulatory Visit | Attending: Physical Medicine and Rehabilitation | Admitting: Physical Medicine and Rehabilitation

## 2019-04-08 ENCOUNTER — Other Ambulatory Visit: Payer: Self-pay

## 2019-04-08 DIAGNOSIS — M47812 Spondylosis without myelopathy or radiculopathy, cervical region: Secondary | ICD-10-CM

## 2019-04-08 DIAGNOSIS — M4313 Spondylolisthesis, cervicothoracic region: Secondary | ICD-10-CM | POA: Diagnosis not present

## 2019-04-08 MED ORDER — DIAZEPAM 5 MG PO TABS
5.0000 mg | ORAL_TABLET | Freq: Once | ORAL | Status: AC
  Administered 2019-04-08: 5 mg via ORAL

## 2019-04-08 MED ORDER — IOPAMIDOL (ISOVUE-M 300) INJECTION 61%
10.0000 mL | Freq: Once | INTRAMUSCULAR | Status: AC | PRN
  Administered 2019-04-08: 10 mL via INTRATHECAL

## 2019-04-08 NOTE — Discharge Instructions (Signed)
Myelogram Discharge Instructions  1. Go home and rest quietly for the next 24 hours.  It is important to lie flat for the next 24 hours.  Get up only to go to the restroom.  You may lie in the bed or on a couch on your back, your stomach, your left side or your right side.  You may have one pillow under your head.  You may have pillows between your knees while you are on your side or under your knees while you are on your back.  2. DO NOT drive today.  Recline the seat as far back as it will go, while still wearing your seat belt, on the way home.  3. You may get up to go to the bathroom as needed.  You may sit up for 10 minutes to eat.  You may resume your normal diet and medications unless otherwise indicated.  Drink lots of extra fluids today and tomorrow.  4. The incidence of headache, nausea, or vomiting is about 5% (one in 20 patients).  If you develop a headache, lie flat and drink plenty of fluids until the headache goes away.  Caffeinated beverages may be helpful.  If you develop severe nausea and vomiting or a headache that does not go away with flat bed rest, call [phone].  5. You may resume normal activities after your 24 hours of bed rest is over; however, do not exert yourself strongly or do any heavy lifting tomorrow. If when you get up you have a headache when standing, go back to bed and force fluids for another 24 hours.  6. Call your physician for a follow-up appointment.  The results of your myelogram will be sent directly to your physician by the following day.  7. If you have any questions or if complications develop after you arrive home, please call [phone].  Discharge instructions have been explained to the patient.  The patient, or the person responsible for the patient, fully understands these instructions.  YOU MAY RESTART YOUR ELIQUIS TODAY. YOU MAY RESTART YOUR CELEXA TOMORROW 04/09/2019 AT 10:10AM.

## 2019-04-08 NOTE — Progress Notes (Signed)
Patient states she has been off Eliquis for the past three days and Celexa for the past two days.

## 2019-04-10 ENCOUNTER — Ambulatory Visit (INDEPENDENT_AMBULATORY_CARE_PROVIDER_SITE_OTHER): Payer: Medicare PPO | Admitting: *Deleted

## 2019-04-10 DIAGNOSIS — I442 Atrioventricular block, complete: Secondary | ICD-10-CM

## 2019-04-11 LAB — CUP PACEART REMOTE DEVICE CHECK
Battery Remaining Longevity: 109 mo
Battery Remaining Percentage: 89 %
Battery Voltage: 2.95 V
Brady Statistic AP VP Percent: 91 %
Brady Statistic AP VS Percent: 1 %
Brady Statistic AS VP Percent: 8.6 %
Brady Statistic AS VS Percent: 1 %
Brady Statistic RA Percent Paced: 90 %
Brady Statistic RV Percent Paced: 99 %
Date Time Interrogation Session: 20200603071712
Implantable Lead Implant Date: 20140805
Implantable Lead Implant Date: 20140805
Implantable Lead Location: 753859
Implantable Lead Location: 753860
Implantable Lead Model: 1944
Implantable Lead Model: 1948
Implantable Pulse Generator Implant Date: 20140805
Lead Channel Impedance Value: 540 Ohm
Lead Channel Impedance Value: 600 Ohm
Lead Channel Pacing Threshold Amplitude: 0.875 V
Lead Channel Pacing Threshold Pulse Width: 0.4 ms
Lead Channel Sensing Intrinsic Amplitude: 12 mV
Lead Channel Sensing Intrinsic Amplitude: 2.1 mV
Lead Channel Setting Pacing Amplitude: 1.125
Lead Channel Setting Pacing Amplitude: 2 V
Lead Channel Setting Pacing Pulse Width: 0.4 ms
Lead Channel Setting Sensing Sensitivity: 4 mV
Pulse Gen Model: 2240
Pulse Gen Serial Number: 7523768

## 2019-04-15 DIAGNOSIS — M47812 Spondylosis without myelopathy or radiculopathy, cervical region: Secondary | ICD-10-CM | POA: Diagnosis not present

## 2019-04-19 ENCOUNTER — Encounter: Payer: Self-pay | Admitting: Cardiology

## 2019-04-19 NOTE — Progress Notes (Signed)
Remote pacemaker transmission.   

## 2019-04-30 ENCOUNTER — Other Ambulatory Visit: Payer: Self-pay | Admitting: Internal Medicine

## 2019-04-30 DIAGNOSIS — N6489 Other specified disorders of breast: Secondary | ICD-10-CM

## 2019-05-15 DIAGNOSIS — G9619 Other disorders of meninges, not elsewhere classified: Secondary | ICD-10-CM | POA: Diagnosis not present

## 2019-05-29 ENCOUNTER — Ambulatory Visit (INDEPENDENT_AMBULATORY_CARE_PROVIDER_SITE_OTHER): Payer: Medicare PPO | Admitting: Internal Medicine

## 2019-05-29 ENCOUNTER — Encounter: Payer: Self-pay | Admitting: Internal Medicine

## 2019-05-29 ENCOUNTER — Other Ambulatory Visit: Payer: Self-pay

## 2019-05-29 VITALS — BP 110/66 | HR 64 | Temp 97.4°F | Resp 16 | Ht 64.5 in | Wt 164.4 lb

## 2019-05-29 DIAGNOSIS — R7309 Other abnormal glucose: Secondary | ICD-10-CM | POA: Diagnosis not present

## 2019-05-29 DIAGNOSIS — G250 Essential tremor: Secondary | ICD-10-CM | POA: Diagnosis not present

## 2019-05-29 DIAGNOSIS — Z950 Presence of cardiac pacemaker: Secondary | ICD-10-CM | POA: Diagnosis not present

## 2019-05-29 DIAGNOSIS — I10 Essential (primary) hypertension: Secondary | ICD-10-CM

## 2019-05-29 DIAGNOSIS — Z79899 Other long term (current) drug therapy: Secondary | ICD-10-CM

## 2019-05-29 DIAGNOSIS — E782 Mixed hyperlipidemia: Secondary | ICD-10-CM | POA: Diagnosis not present

## 2019-05-29 DIAGNOSIS — E559 Vitamin D deficiency, unspecified: Secondary | ICD-10-CM

## 2019-05-29 DIAGNOSIS — I442 Atrioventricular block, complete: Secondary | ICD-10-CM | POA: Diagnosis not present

## 2019-05-29 MED ORDER — GABAPENTIN 300 MG PO CAPS: ORAL_CAPSULE | ORAL | 1 refills | Status: DC

## 2019-05-29 MED ORDER — IBUPROFEN 600 MG PO TABS: ORAL_TABLET | ORAL | 1 refills | Status: DC

## 2019-05-29 MED ORDER — NADOLOL 20 MG PO TABS: ORAL_TABLET | ORAL | 3 refills | Status: DC

## 2019-05-29 NOTE — Progress Notes (Signed)
History of Present Illness:      This very nice 78 y.o.  WWF presents for 6 month follow up with HTN,  ASHD /CHB / PPM,  HLD, Pre-Diabetes and Vitamin D Deficiency. Patient has GERD controlled with her meds. Patient has received EDSI from Dr Regino SchultzeWang for Neck & LBP in the past w/o significant benefit.  Patient has chronic neck & back pain & desires to try non-opioid alternative.      Patient reports noticing over the last year a fine tremor and has a strong (+) FHX of tremor in [facility].       Patient is treated for HTN (2005)  & BP has been controlled at home. Today's BP is at goal - 110/66.  In 2014, she presented asymptomatic in [facility] and had a PPM implanted by Dr Johney FrameAllred. In Dec 2019, she was dx'd w/Afib and started on Eliquis. Patient has had no complaints of any cardiac type chest pain, palpitations, dyspnea / orthopnea / PND, dizziness, claudication, or dependent edema.      Hyperlipidemia is controlled with diet & meds. Patient denies myalgias or other med SE's. Last Lipids were  Lab Results  Component Value Date   CHOL 189 05/29/2019   HDL 53 05/29/2019   LDLCALC 118 (H) 05/29/2019   TRIG 84 05/29/2019   CHOLHDL 3.6 05/29/2019        Also, the patient has history of PreDiabetes (A1c 5.9% / 2011 & 2016)  and has had no symptoms of reactive hypoglycemia, diabetic polys, paresthesias or visual blurring.  Last A1c was  Lab Results  Component Value Date   HGBA1C 5.7 (H) 05/29/2019        Further, the patient also has history of Vitamin D Deficiency ("34" / 2008)  and supplements vitamin D without any suspected side-effects. Last vitamin D was near goal (70-100): Lab Results  Component Value Date   VD25OH 67 05/29/2019   Current Outpatient Medications on File Prior to Visit  Medication Sig  . ALPRAZolam (XANAX) 1 MG tablet Take 1/2 - 1 tablet 2 - 3 x /day ONLY if needed for Anxiety Attack &  limit to 5 days /week to avoid addiction  . Ascorbic Acid (VITAMIN C PO) Take 1 tablet  by mouth daily.   Marland Kitchen. BIOTIN PO Take 1 tablet by mouth daily.   . Calcium Carbonate-Vitamin D (CALCIUM + D PO) Take 1 tablet by mouth daily.   . Cholecalciferol (VITAMIN D PO) Take 5,000 Units by mouth 2 (two) times a day.   . citalopram (CELEXA) 20 MG tablet TAKE 1 TABLET EVERY DAY  (NEW  DOSE)  . ELIQUIS 5 MG TABS tablet TAKE 1 TABLET TWICE DAILY  . fluticasone (FLONASE) 50 MCG/ACT nasal spray Place 2 sprays into both nostrils daily. (Patient taking differently: Place 2 sprays into both nostrils daily as needed for allergies or rhinitis. )  . FOLIC ACID PO Take 1 tablet by mouth daily.   . hydrochlorothiazide (HYDRODIURIL) 25 MG tablet Take 25 mg by mouth daily as needed (swelling).   . hydrocortisone (PROCTOZONE-HC) 2.5 % rectal cream PLACE 1 APPLICATION RECTALLY 2 (TWO) TIMES DAILY.  Marland Kitchen. MAGNESIUM PO Take 500 mg by mouth daily.   . metoprolol succinate (TOPROL-XL) 50 MG 24 hr tablet TAKE 1 TABLET EVERY DAY FOR BLOOD PRESSURE  . Multiple Vitamin (MULTIVITAMIN) tablet Take 1 tablet by mouth daily.  . Tretinoin, Facial Wrinkles, (TRETINOIN, EMOLLIENT,) 0.05 % CREA APPLY TO AFFECTED AREA EVERY DAY AS  DIRECTED   No current facility-administered medications on file prior to visit.     Allergies  Allergen Reactions  . Levsin [Hyoscyamine Sulfate]     unknown  . Mobic [Meloxicam]     unknown   . Topamax [Topiramate]     unknown   . Zoloft [Sertraline Hcl]     unknown   . Actonel [Risedronate Sodium] Other (See Comments)    Pt didn't like the way it made her feel   . Boniva [Ibandronic Acid] Other (See Comments)    Pt didn't like the way it made her feel  . Evista [Raloxifene] Other (See Comments)    Pt didn't like the way it made her feel  . Fosamax [Alendronate Sodium] Other (See Comments)    Pt didn't like the way it made her feel    PMHx:   Past Medical History:  Diagnosis Date  . Anxiety   . Atrophic vaginitis   . Chest pain    a. Adenosine sestamibi (1/14) with no evidence  of ischemia or infarction, EF 63%.   . Colon polyps   . Complete heart block ([place]) 06-2013   status post pacemaker implantation by Dr Rayann Heman 06-11-2013  . Depression   . DI (detrusor instability)   . GERD (gastroesophageal reflux disease)    History + Hyplori via EGD  . H/O echocardiogram    a. Echo 11/2012:EF 50-55%, focal basal septal hypertrophy with some mitral valve SAM (mild) and MR.  This may be a sigmoid septum with advanced age or could be a variant of hypertrophic cardiomyopathy.  Marland Kitchen HTN (hypertension)   . Hyperlipidemia   . LBBB (left bundle branch block)   . Leukopenia 05/19/2014  . Lymphocytosis 05/19/2014  . Migraines   . Osteoporosis   . Paroxysmal atrial fibrillation (HCC)   . Prediabetes   . Vitamin D deficiency    Immunization History  Administered Date(s) Administered  . Influenza Split 08/06/2014, 09/20/2017  . Influenza, High Dose Seasonal PF 09/01/2015  . Influenza-Unspecified 09/12/2018  . Pneumococcal Conjugate-13 09/01/2015  . Pneumococcal-Unspecified 10/08/2010  . Td 05/09/2011  . Zoster 05/09/2011   Past Surgical History:  Procedure Laterality Date  . BREAST EXCISIONAL BIOPSY Right   . BREAST SURGERY     Breast cyst  . CATARACT EXTRACTION Left 2019   Dr. Katy Fitch  . HEMORRHOID SURGERY    . LEFT HEART CATHETERIZATION WITH CORONARY ANGIOGRAM N/A 06/11/2013   Procedure: LEFT HEART CATHETERIZATION WITH CORONARY ANGIOGRAM;  Surgeon: Burnell Blanks, MD;  Location[facility] CATH LAB;  Service: Cardiovascular;  Laterality: N/A;  . PACEMAKER INSERTION  06-11-2013   STJ dual chamber pacemaker implanted by Dr Rayann Heman for complete heart block  . PERMANENT PACEMAKER INSERTION Left 06/11/2013   Procedure: PERMANENT PACEMAKER INSERTION;  Surgeon: Burnell Blanks, MD;  Location: [facility] CATH LAB;  Service: Cardiovascular;  Laterality: Left;  . PERMANENT PACEMAKER INSERTION N/A 06/11/2013   Procedure: PERMANENT PACEMAKER INSERTION;  Surgeon: Thompson Grayer, MD;  Location: [facility]  CATH LAB;  Service: Cardiovascular;  Laterality: N/A;  . TEMPORARY PACEMAKER INSERTION N/A 06/11/2013   Procedure: TEMPORARY PACEMAKER INSERTION;  Surgeon: Burnell Blanks, MD;  Location: [facility] CATH LAB;  Service: Cardiovascular;  Laterality: N/A;  . TUBAL LIGATION     FHx:    Reviewed / unchanged  SHx:    Reviewed / unchanged   Systems Review:  Constitutional: Denies fever, chills, wt changes, headaches, insomnia, fatigue, night sweats, change in appetite. Eyes: Denies redness, blurred vision, diplopia,  discharge, itchy, watery eyes.  ENT: Denies discharge, congestion, post nasal drip, epistaxis, sore throat, earache, hearing loss, dental pain, tinnitus, vertigo, sinus pain, snoring.  CV: Denies chest pain, palpitations, irregular heartbeat, syncope, dyspnea, diaphoresis, orthopnea, PND, claudication or edema. Respiratory: denies cough, dyspnea, DOE, pleurisy, hoarseness, laryngitis, wheezing.  Gastrointestinal: Denies dysphagia, odynophagia, heartburn, reflux, water brash, abdominal pain or cramps, nausea, vomiting, bloating, diarrhea, constipation, hematemesis, melena, hematochezia  or hemorrhoids. Genitourinary: Denies dysuria, frequency, urgency, nocturia, hesitancy, discharge, hematuria or flank pain. Musculoskeletal: Denies arthralgias, myalgias, stiffness, jt. swelling, pain, limping or strain/sprain.  Skin: Denies pruritus, rash, hives, warts, acne, eczema or change in skin lesion(s). Neuro: No weakness, tremor, incoordination, spasms, paresthesia or pain. Psychiatric: Denies confusion, memory loss or sensory loss. Endo: Denies change in weight, skin or hair change.  Heme/Lymph: No excessive bleeding, bruising or enlarged lymph nodes.  Physical Exam  BP 110/66   Pulse 64   Temp (!) 97.4 F (36.3 C)   Resp 16   Ht 5' 4.5" (1.638 m)   Wt 164 lb 6.4 oz (74.6 kg)   BMI 27.78 kg/m   Appears  well nourished, well groomed  and in no distress.  Eyes: PERRLA, EOMs,  conjunctiva no swelling or erythema. Sinuses: No frontal/maxillary tenderness ENT/Mouth: EAC's clear, TM's nl w/o erythema, bulging. Nares clear w/o erythema, swelling, exudates. Oropharynx clear without erythema or exudates. Oral hygiene is good. Tongue normal, non obstructing. Hearing intact.  Neck: Supple. Thyroid not palpable. Car 2+/2+ without bruits, nodes or JVD. Chest: Respirations nl with BS clear & equal w/o rales, rhonchi, wheezing or stridor.  Cor: Heart sounds normal w/ regular rate and rhythm without sig. murmurs, gallops, clicks or rubs. Peripheral pulses normal and equal  without edema.  Abdomen: Soft & bowel sounds normal. Non-tender w/o guarding, rebound, hernias, masses or organomegaly.  Lymphatics: Unremarkable.  Musculoskeletal: Full ROM all peripheral extremities, joint stability, 5/5 strength and normal gait.  Skin: Warm, dry without exposed rashes, lesions or ecchymosis apparent.  Neuro: Cranial nerves intact, reflexes equal bilaterally. Sensory-motor testing grossly intact. Tendon reflexes grossly intact. Fine high frequency low amplitude tremor of the hands with action extinguishing at rest. Pysch: Alert & oriented x 3.  Insight and judgement nl & appropriate. No ideations.  Assessment and Plan:  1. Essential hypertension  - Continue medication, monitor blood pressure at home.  - Continue DASH diet.  Reminder to go to the ER if any CP,  SOB, nausea, dizziness, severe HA, changes vision/speech.   - CBC with Differential/Platelet - COMPLETE METABOLIC PANEL WITH GFR - Magnesium - TSH  2. Hyperlipidemia, mixed  - Continue diet/meds, exercise,& lifestyle modifications.  - Continue monitor periodic cholesterol/liver & renal functions   - Lipid panel - TSH  3. Abnormal glucose  - Hemoglobin A1c - Insulin, random  4. Vitamin D deficiency  - Continue diet, exercise  - Lifestyle modifications.  - Monitor appropriate labs. - Continue supplementation.  -  VITAMIN D 25 Hydroxyl  5. Essential tremor  - Change Metoprolol to Nadolol a more centrally acting Beta Blocker for tremor  6. CHB (complete heart block) (HCC)  7. Pacemaker  8. Medication management  - CBC with Differential/Platelet - COMPLETE METABOLIC PANEL WITH GFR - Magnesium - Lipid panel - TSH - Hemoglobin A1c - Insulin, random - VITAMIN D 25 Hydroxyl             Discussed  regular exercise, BP monitoring, weight control to achieve/maintain BMI less than 25 and discussed med  and SE's. Recommended labs to assess and monitor clinical status with further disposition pending results of labs.  I discussed the assessment and treatment plan with the patient. The patient was provided an opportunity to ask questions and all were answered. The patient agreed with the plan and demonstrated an understanding of the instructions.  I provided over 30 minutes of exam, counseling, chart review and  complex critical decision making.      P{atient given an Rx of Gabapentin to try for her Chronic neck / back pain and also advised to supplement with Ibuprofen. The patient was advised to call back or seek an in-person evaluation if the symptoms worsen or if the condition fails to improve as anticipated.   Marinus MawWilliam D Penney Domanski, MD

## 2019-05-29 NOTE — Patient Instructions (Signed)

## 2019-05-30 LAB — HEMOGLOBIN A1C
Hgb A1c MFr Bld: 5.7 % of total Hgb — ABNORMAL HIGH (ref <5.7)
Mean Plasma Glucose: 117 (calc)
eAG (mmol/L): 6.5 (calc)

## 2019-05-30 LAB — COMPLETE METABOLIC PANEL WITH GFR
AG Ratio: 1.6 (calc) (ref 1.0–2.5)
ALT: 11 U/L (ref 6–29)
AST: 17 U/L (ref 10–35)
Albumin: 4.1 g/dL (ref 3.6–5.1)
Alkaline phosphatase (APISO): 83 U/L (ref 37–153)
BUN: 12 mg/dL (ref 7–25)
CO2: 26 mmol/L (ref 20–32)
Calcium: 9.4 mg/dL (ref 8.6–10.4)
Chloride: 105 mmol/L (ref 98–110)
Creat: 0.89 mg/dL (ref 0.60–0.93)
GFR, Est African American: 72 mL/min/{1.73_m2} (ref >=60)
GFR, Est Non African American: 62 mL/min/{1.73_m2} (ref >=60)
Globulin: 2.5 g/dL (calc) (ref 1.9–3.7)
Glucose, Bld: 100 mg/dL — ABNORMAL HIGH (ref 65–99)
Potassium: 4.3 mmol/L (ref 3.5–5.3)
Sodium: 140 mmol/L (ref 135–146)
Total Bilirubin: 0.5 mg/dL (ref 0.2–1.2)
Total Protein: 6.6 g/dL (ref 6.1–8.1)

## 2019-05-30 LAB — CBC WITH DIFFERENTIAL/PLATELET
Absolute Monocytes: 259 cells/uL (ref 200–950)
Basophils Absolute: 41 cells/uL (ref 0–200)
Basophils Relative: 1.1 %
Eosinophils Absolute: 48 cells/uL (ref 15–500)
Eosinophils Relative: 1.3 %
HCT: 39.2 % (ref 35.0–45.0)
Hemoglobin: 12.8 g/dL (ref 11.7–15.5)
Lymphs Abs: 1647 cells/uL (ref 850–3900)
MCH: 28.2 pg (ref 27.0–33.0)
MCHC: 32.7 g/dL (ref 32.0–36.0)
MCV: 86.3 fL (ref 80.0–100.0)
MPV: 10.4 fL (ref 7.5–12.5)
Monocytes Relative: 7 %
Neutro Abs: 1706 cells/uL (ref 1500–7800)
Neutrophils Relative %: 46.1 %
Platelets: 207 10*3/uL (ref 140–400)
RBC: 4.54 10*6/uL (ref 3.80–5.10)
RDW: 13 % (ref 11.0–15.0)
Total Lymphocyte: 44.5 %
WBC: 3.7 10*3/uL — ABNORMAL LOW (ref 3.8–10.8)

## 2019-05-30 LAB — LIPID PANEL
Cholesterol: 189 mg/dL (ref <200)
HDL: 53 mg/dL (ref >=50)
LDL Cholesterol (Calc): 118 mg/dL (calc) — ABNORMAL HIGH
Non-HDL Cholesterol (Calc): 136 mg/dL (calc) — ABNORMAL HIGH (ref <130)
Total CHOL/HDL Ratio: 3.6 (calc) (ref <5.0)
Triglycerides: 84 mg/dL (ref <150)

## 2019-05-30 LAB — TSH: TSH: 0.98 mIU/L (ref 0.40–4.50)

## 2019-05-30 LAB — INSULIN, RANDOM: Insulin: 4.5 u[IU]/mL

## 2019-05-30 LAB — VITAMIN D 25 HYDROXY (VIT D DEFICIENCY, FRACTURES): Vit D, 25-Hydroxy: 67 ng/mL (ref 30–100)

## 2019-05-30 LAB — MAGNESIUM: Magnesium: 2.3 mg/dL (ref 1.5–2.5)

## 2019-06-01 ENCOUNTER — Encounter: Payer: Self-pay | Admitting: Internal Medicine

## 2019-06-10 ENCOUNTER — Encounter: Payer: Self-pay | Admitting: Internal Medicine

## 2019-06-10 DIAGNOSIS — G250 Essential tremor: Secondary | ICD-10-CM | POA: Insufficient documentation

## 2019-06-11 ENCOUNTER — Other Ambulatory Visit: Payer: Self-pay | Admitting: Neurological Surgery

## 2019-06-11 DIAGNOSIS — G96198 Other disorders of meninges, not elsewhere classified: Secondary | ICD-10-CM

## 2019-06-20 ENCOUNTER — Ambulatory Visit
Admission: RE | Admit: 2019-06-20 | Discharge: 2019-06-20 | Disposition: A | Discharge status: Home / Self Care | Payer: Medicare PPO | Source: Ambulatory Visit | Attending: Neurological Surgery | Admitting: Neurological Surgery

## 2019-06-20 ENCOUNTER — Other Ambulatory Visit: Payer: Self-pay | Admitting: Internal Medicine

## 2019-06-20 ENCOUNTER — Other Ambulatory Visit: Payer: Self-pay

## 2019-06-20 ENCOUNTER — Telehealth: Payer: Self-pay | Admitting: *Deleted

## 2019-06-20 DIAGNOSIS — M5414 Radiculopathy, thoracic region: Secondary | ICD-10-CM | POA: Diagnosis not present

## 2019-06-20 DIAGNOSIS — D334 Benign neoplasm of spinal cord: Secondary | ICD-10-CM | POA: Diagnosis not present

## 2019-06-20 DIAGNOSIS — G96198 Other disorders of meninges, not elsewhere classified: Secondary | ICD-10-CM

## 2019-06-20 MED ORDER — PANTOPRAZOLE SODIUM 40 MG PO TBEC: DELAYED_RELEASE_TABLET | ORAL | 3 refills | Status: DC

## 2019-06-20 NOTE — Telephone Encounter (Signed)
Patient called and states she is having reflux and burning in her chest after eating. She no onger takes Ranitidine, due to being recalled.  Dr Melford Aase sent in a new RX for Pantoprazole. Patient is aware and will call back if needed.

## 2019-06-21 ENCOUNTER — Other Ambulatory Visit: Payer: Self-pay | Admitting: Adult Health

## 2019-07-03 DIAGNOSIS — G9619 Other disorders of meninges, not elsewhere classified: Secondary | ICD-10-CM | POA: Diagnosis not present

## 2019-07-03 DIAGNOSIS — M47812 Spondylosis without myelopathy or radiculopathy, cervical region: Secondary | ICD-10-CM | POA: Diagnosis not present

## 2019-07-08 ENCOUNTER — Telehealth: Payer: Self-pay | Admitting: *Deleted

## 2019-07-08 NOTE — Telephone Encounter (Signed)
Patient called and reported she gets very nauseated after taking Nadolol. She stopped taking Pantoprazole.  Per Dr Melford Aase, restart Pantoprazole twice a day. At the patient's request, the patient will stop Nadolol and restart Metoprolol, which is OK with Dr Melford Aase.

## 2019-07-10 ENCOUNTER — Ambulatory Visit (INDEPENDENT_AMBULATORY_CARE_PROVIDER_SITE_OTHER): Payer: Medicare PPO | Admitting: *Deleted

## 2019-07-10 DIAGNOSIS — I442 Atrioventricular block, complete: Secondary | ICD-10-CM

## 2019-07-11 LAB — CUP PACEART REMOTE DEVICE CHECK
Battery Remaining Longevity: 109 mo
Battery Remaining Percentage: 89 %
Battery Voltage: 2.95 V
Brady Statistic AP VP Percent: 91 %
Brady Statistic AP VS Percent: 1 %
Brady Statistic AS VP Percent: 8 %
Brady Statistic AS VS Percent: 1 %
Brady Statistic RA Percent Paced: 91 %
Brady Statistic RV Percent Paced: 99 %
Date Time Interrogation Session: 20200902060036
Implantable Lead Implant Date: 20140805
Implantable Lead Implant Date: 20140805
Implantable Lead Location: 753859
Implantable Lead Location: 753860
Implantable Lead Model: 1944
Implantable Lead Model: 1948
Implantable Pulse Generator Implant Date: 20140805
Lead Channel Impedance Value: 510 Ohm
Lead Channel Impedance Value: 600 Ohm
Lead Channel Pacing Threshold Amplitude: 0.5 V
Lead Channel Pacing Threshold Amplitude: 0.875 V
Lead Channel Pacing Threshold Pulse Width: 0.2 ms
Lead Channel Pacing Threshold Pulse Width: 0.4 ms
Lead Channel Sensing Intrinsic Amplitude: 12 mV
Lead Channel Sensing Intrinsic Amplitude: 2.1 mV
Lead Channel Setting Pacing Amplitude: 1.125
Lead Channel Setting Pacing Amplitude: 2 V
Lead Channel Setting Pacing Pulse Width: 0.4 ms
Lead Channel Setting Sensing Sensitivity: 4 mV
Pulse Gen Model: 2240
Pulse Gen Serial Number: 7523768

## 2019-07-23 ENCOUNTER — Other Ambulatory Visit: Payer: Self-pay | Admitting: Internal Medicine

## 2019-07-23 DIAGNOSIS — N63 Unspecified lump in unspecified breast: Secondary | ICD-10-CM

## 2019-07-25 ENCOUNTER — Encounter: Payer: Self-pay | Admitting: Cardiology

## 2019-07-25 ENCOUNTER — Other Ambulatory Visit: Payer: Self-pay | Admitting: Internal Medicine

## 2019-07-25 ENCOUNTER — Ambulatory Visit
Admission: RE | Admit: 2019-07-25 | Discharge: 2019-07-25 | Disposition: A | Discharge status: Home / Self Care | Payer: Medicare PPO | Source: Ambulatory Visit | Attending: Internal Medicine | Admitting: Internal Medicine

## 2019-07-25 ENCOUNTER — Other Ambulatory Visit: Payer: Self-pay

## 2019-07-25 DIAGNOSIS — N6323 Unspecified lump in the left breast, lower outer quadrant: Secondary | ICD-10-CM | POA: Diagnosis not present

## 2019-07-25 DIAGNOSIS — R922 Inconclusive mammogram: Secondary | ICD-10-CM | POA: Diagnosis not present

## 2019-07-25 DIAGNOSIS — N63 Unspecified lump in unspecified breast: Secondary | ICD-10-CM

## 2019-07-25 HISTORY — PX: BREAST BIOPSY: SHX20

## 2019-07-25 NOTE — Progress Notes (Signed)
Remote pacemaker transmission.   

## 2019-07-29 ENCOUNTER — Ambulatory Visit
Admission: RE | Admit: 2019-07-29 | Discharge: 2019-07-29 | Disposition: A | Discharge status: Home / Self Care | Payer: Medicare PPO | Source: Ambulatory Visit | Attending: Internal Medicine | Admitting: Internal Medicine

## 2019-07-29 ENCOUNTER — Other Ambulatory Visit: Payer: Self-pay | Admitting: Internal Medicine

## 2019-07-29 ENCOUNTER — Other Ambulatory Visit: Payer: Self-pay

## 2019-07-29 DIAGNOSIS — N63 Unspecified lump in unspecified breast: Secondary | ICD-10-CM

## 2019-07-31 ENCOUNTER — Ambulatory Visit
Admission: RE | Admit: 2019-07-31 | Discharge: 2019-07-31 | Disposition: A | Discharge status: Home / Self Care | Payer: Medicare PPO | Source: Ambulatory Visit | Attending: Internal Medicine | Admitting: Internal Medicine

## 2019-07-31 ENCOUNTER — Other Ambulatory Visit: Payer: Self-pay

## 2019-07-31 ENCOUNTER — Other Ambulatory Visit: Payer: Self-pay | Admitting: Diagnostic Radiology

## 2019-07-31 DIAGNOSIS — N63 Unspecified lump in unspecified breast: Secondary | ICD-10-CM

## 2019-07-31 DIAGNOSIS — N6322 Unspecified lump in the left breast, upper inner quadrant: Secondary | ICD-10-CM | POA: Diagnosis not present

## 2019-07-31 DIAGNOSIS — D171 Benign lipomatous neoplasm of skin and subcutaneous tissue of trunk: Secondary | ICD-10-CM | POA: Diagnosis not present

## 2019-08-06 ENCOUNTER — Other Ambulatory Visit: Payer: Self-pay | Admitting: *Deleted

## 2019-08-06 DIAGNOSIS — I10 Essential (primary) hypertension: Secondary | ICD-10-CM

## 2019-08-06 MED ORDER — METOPROLOL SUCCINATE ER 50 MG PO TB24: ORAL_TABLET | ORAL | 1 refills | Status: DC

## 2019-08-08 ENCOUNTER — Other Ambulatory Visit: Payer: Self-pay | Admitting: *Deleted

## 2019-08-08 DIAGNOSIS — I10 Essential (primary) hypertension: Secondary | ICD-10-CM

## 2019-08-08 MED ORDER — METOPROLOL SUCCINATE ER 50 MG PO TB24: ORAL_TABLET | ORAL | 1 refills | Status: DC

## 2019-08-15 ENCOUNTER — Other Ambulatory Visit: Payer: Self-pay | Admitting: Internal Medicine

## 2019-08-15 DIAGNOSIS — I10 Essential (primary) hypertension: Secondary | ICD-10-CM

## 2019-08-15 DIAGNOSIS — R450 Nervousness: Secondary | ICD-10-CM

## 2019-08-15 MED ORDER — ALPRAZOLAM 1 MG PO TABS: ORAL_TABLET | ORAL | 0 refills | Status: DC

## 2019-08-15 MED ORDER — METOPROLOL SUCCINATE ER 50 MG PO TB24: ORAL_TABLET | ORAL | 3 refills | Status: DC

## 2019-08-15 MED ORDER — METOPROLOL SUCCINATE ER 50 MG PO TB24: ORAL_TABLET | ORAL | 1 refills | Status: DC

## 2019-08-29 ENCOUNTER — Telehealth: Payer: Self-pay | Admitting: Internal Medicine

## 2019-08-29 NOTE — Telephone Encounter (Signed)
Pt c/o medication issue:  1. Name of Medication: apixaban (ELIQUIS) 5 MG TABS tablet  2. How are you currently taking this medication (dosage and times per day)? Take 1 tablet 2 x /day to Prevent Blood Clots  3. Are you having a reaction (difficulty breathing--STAT)?   4. What is your medication issue? Patient states it's making her fatigue, giving her headaches, and making her nauseous.

## 2019-08-29 NOTE — Telephone Encounter (Addendum)
Patient states she's pretty much had headaches/fatigue since starting eliquis. Per Dr. Rayann Heman last note in Feb 2020 if low AF burden at follow up would consider stopping Eliquis. Per Roderic Palau - have pt send transmission and once reviewed - if burden low will discuss stopping if still with afib burden then will try switching to Xarelto.  Discussed with patient she is in agreement with this plan - she would like someone from device clinic to call her and help walk her through sending the transmission. Will be in touch with patient with update once transmission known.

## 2019-08-30 ENCOUNTER — Ambulatory Visit: Payer: Medicare PPO | Admitting: Physician Assistant

## 2019-09-02 NOTE — Addendum Note (Signed)
Addended by: Juluis Mire on: 09/02/2019 12:57 PM   Modules accepted: Orders

## 2019-09-02 NOTE — Telephone Encounter (Signed)
The remote transmissions from 04/10/19 and 07/10/19 show AF burden of 0% and no AF episodes recorded. She is not due for next transmission until 10/09/19.

## 2019-09-02 NOTE — Telephone Encounter (Signed)
Per Dr. Jackalyn Lombard last note in February if transmissions do not show afib then could consider stopping anticoagulation. If pt wants to do this can stop. Please let device clinic know if she does stop, to make note she is not on anticoagulation so further afib episodes would be flagged to restart if indicated   Above per Roderic Palau NP - patient notified she may stop eliquis at this time. If AF should return will need to readdress DOAC need. If persistent symptoms once off Eliquis pt should follow up with her PCP. Pt verbalized understanding.

## 2019-09-03 ENCOUNTER — Other Ambulatory Visit: Payer: Self-pay | Admitting: Internal Medicine

## 2019-09-27 ENCOUNTER — Other Ambulatory Visit: Payer: Self-pay | Admitting: Internal Medicine

## 2019-10-09 ENCOUNTER — Ambulatory Visit (INDEPENDENT_AMBULATORY_CARE_PROVIDER_SITE_OTHER): Payer: Medicare PPO | Admitting: *Deleted

## 2019-10-09 DIAGNOSIS — I442 Atrioventricular block, complete: Secondary | ICD-10-CM

## 2019-10-10 LAB — CUP PACEART REMOTE DEVICE CHECK
Battery Remaining Longevity: 108 mo
Battery Remaining Percentage: 89 %
Battery Voltage: 2.95 V
Brady Statistic AP VP Percent: 91 %
Brady Statistic AP VS Percent: 1 %
Brady Statistic AS VP Percent: 8.9 %
Brady Statistic AS VS Percent: 1 %
Brady Statistic RA Percent Paced: 90 %
Brady Statistic RV Percent Paced: 99 %
Date Time Interrogation Session: 20201202193515
Implantable Lead Implant Date: 20140805
Implantable Lead Implant Date: 20140805
Implantable Lead Location: 753859
Implantable Lead Location: 753860
Implantable Lead Model: 1944
Implantable Lead Model: 1948
Implantable Pulse Generator Implant Date: 20140805
Lead Channel Impedance Value: 490 Ohm
Lead Channel Impedance Value: 610 Ohm
Lead Channel Pacing Threshold Amplitude: 0.5 V
Lead Channel Pacing Threshold Amplitude: 0.625 V
Lead Channel Pacing Threshold Pulse Width: 0.2 ms
Lead Channel Pacing Threshold Pulse Width: 0.4 ms
Lead Channel Sensing Intrinsic Amplitude: 12 mV
Lead Channel Sensing Intrinsic Amplitude: 2.1 mV
Lead Channel Setting Pacing Amplitude: 0.875
Lead Channel Setting Pacing Amplitude: 2 V
Lead Channel Setting Pacing Pulse Width: 0.4 ms
Lead Channel Setting Sensing Sensitivity: 4 mV
Pulse Gen Model: 2240
Pulse Gen Serial Number: 7523768

## 2019-10-25 ENCOUNTER — Other Ambulatory Visit: Payer: Self-pay | Admitting: Internal Medicine

## 2019-10-25 DIAGNOSIS — I10 Essential (primary) hypertension: Secondary | ICD-10-CM

## 2019-10-25 MED ORDER — METOPROLOL SUCCINATE ER 100 MG PO TB24: ORAL_TABLET | ORAL | 3 refills | Status: DC

## 2019-10-30 ENCOUNTER — Telehealth: Payer: Self-pay | Admitting: Internal Medicine

## 2019-10-30 ENCOUNTER — Other Ambulatory Visit: Payer: Self-pay | Admitting: Internal Medicine

## 2019-10-30 DIAGNOSIS — R450 Nervousness: Secondary | ICD-10-CM

## 2019-10-30 MED ORDER — ALPRAZOLAM 1 MG PO TABS: ORAL_TABLET | ORAL | 0 refills | Status: DC

## 2019-10-30 NOTE — Telephone Encounter (Signed)
Patient wanted to make sure her monitor was working after moving to a new home the week of Thanksgiving. Transmission received 10/09/19 and monitor is functioning.

## 2019-10-30 NOTE — Telephone Encounter (Signed)
New message   Patient has questions about if her device is working correctly. Please call.

## 2019-10-30 NOTE — Telephone Encounter (Signed)
LMOM with device clinic contact #.

## 2019-11-07 ENCOUNTER — Telehealth: Payer: Self-pay | Admitting: *Deleted

## 2019-11-07 NOTE — Telephone Encounter (Signed)
Left a message to call me.Left a message to call me.Left a message to call me.

## 2019-12-02 ENCOUNTER — Encounter: Payer: Self-pay | Admitting: Internal Medicine

## 2019-12-02 NOTE — Patient Instructions (Signed)
- Link to sign up at [place] Web site for the Covid[place]19 vaccine   [email or web address]   Or call [phone]  ++++++++++++++++++++++++++++++++ Vit D  & Vit C 1,000 mg   are recommended to help protect  against the Covid-19 and other Corona viruses.    Also it's recommended  to take  Zinc 50 mg  to help  protect against the Covid-19   and best place to get  is also on [employer].com  and don't pay more than 6-8 cents /pill !  ================================ Coronavirus (COVID-19) Are you at risk?  Are you at risk for the Coronavirus (COVID-19)?  To be considered HIGH RISK for Coronavirus (COVID-19), you have to meet the following criteria:  . Traveled to [place], [place], [place], [place] or [place]; or in the [place] to [place], [place], [place]  . or [place]; and have fever, cough, and shortness of breath within the last 2 weeks of travel OR . Been in close contact with a person diagnosed with COVID-19 within the last 2 weeks and have  . fever, cough,and shortness of breath .  . IF YOU DO NOT MEET THESE CRITERIA, YOU ARE CONSIDERED LOW RISK FOR COVID-19.  What to do if you are HIGH RISK for COVID-19?  . If you are having a medical emergency, call 911. . Seek medical care right away. Before you go to a doctor's office, urgent care or emergency department, .  call ahead and tell them about your recent travel, contact with someone diagnosed with COVID-19  .  and your symptoms.  . You should receive instructions from your physician's office regarding next steps of care.  . When you arrive at healthcare provider, tell the healthcare staff immediately you have returned from  . visiting [place], [place], [place], [place] or [place]; or traveled in the [place] to [place], [place],  . [place] or [place] in the last two weeks  or you have been in close contact with a person diagnosed with  . COVID-19 in the last 2 weeks.   . Tell the health care staff about your symptoms: fever, cough and shortness of breath. . After you have been seen by a medical provider, you will be either: o Tested for (COVID-19) and discharged home on quarantine except to seek medical care if  o symptoms worsen, and asked to  - Stay home and avoid contact with others until you get your results (4-5 days)  - Avoid travel on public transportation if possible (such as bus, train, or airplane) or o Sent to the Emergency Department by EMS for evaluation, COVID-19 testing  and  o possible admission depending on your condition and test results.  What to do if you are LOW RISK for COVID-19?  Reduce your risk of any infection by using the same precautions used for avoiding the common cold or flu:  . Wash your hands often with soap and warm water for at least 20 seconds.  If soap and water are not readily available,  . use an alcohol-based hand sanitizer with at least 60% alcohol.  . If coughing or sneezing, cover your mouth and nose by coughing or sneezing into the elbow areas of your shirt or coat, .  into a tissue or into your sleeve (not your hands). . Avoid shaking hands with others and consider head nods or verbal greetings only. . Avoid touching your eyes, nose, or mouth with unwashed hands.  .   Avoid close contact with people who are sick. . Avoid places or events with large numbers of people in one location, like concerts or sporting events. . Carefully consider travel plans you have or are making. . If you are planning any travel outside or inside the [place], visit the CDC's Travelers' Health webpage for the latest health notices. . If you have some symptoms but not all symptoms, continue to monitor at home and seek medical attention  . if your symptoms worsen. . If you are having a medical emergency, call  911.   . >>>>>>>>>>>>>>>>>>>>>>>>>>>>>>>>> . We Do NOT Approve of  [facility], [employer]  Our Patients  To Do Home Visits & We Do NOT Approve of [employer] > > > > > > > > > > > > > > > > > > > > > > > > > > > > > > > > > > > > > > >  Preventive Care for Adults  A healthy lifestyle and preventive care can promote health and wellness. Preventive health guidelines for women include the following key practices.  A routine yearly physical is a good way to check with your health care provider about your health and preventive screening. It is a chance to share any concerns and updates on your health and to receive a thorough exam.  Visit your dentist for a routine exam and preventive care every 6 months. Brush your teeth twice a day and floss once a day. Good oral hygiene prevents tooth decay and gum disease.  The frequency of eye exams is based on your age, health, family medical history, use of contact lenses, and other factors. Follow your health care provider's recommendations for frequency of eye exams.  Eat a healthy diet. Foods like vegetables, fruits, whole grains, low-fat dairy products, and lean protein foods contain the nutrients you need without too many calories. Decrease your intake of foods high in solid fats, added sugars, and salt. Eat the right amount of calories for you. Get information about a proper diet from your health care provider, if necessary.  Regular physical exercise is one of the most important things you can do for your health. Most adults should get at least 150 minutes of moderate-intensity exercise (any activity that increases your heart rate and causes you to sweat) each week. In addition, most adults need muscle-strengthening exercises on 2 or more days a week.  Maintain a healthy weight. The body mass index (BMI) is a screening tool to identify possible weight problems. It provides an estimate of body fat based on height and  weight. Your health care provider can find your BMI and can help you achieve or maintain a healthy weight. For adults 20 years and older:  A BMI below 18.5 is considered underweight.  A BMI of 18.5 to 24.9 is normal.  A BMI of 25 to 29.9 is considered overweight.  A BMI of 30 and above is considered obese.  Maintain normal blood lipids and cholesterol levels by exercising and minimizing your intake of saturated fat. Eat a balanced diet with plenty of fruit and vegetables. If your lipid or cholesterol levels are high, you are over 50, or you are at high risk for heart disease, you may need your cholesterol levels checked more frequently. Ongoing high lipid and cholesterol levels should be treated with medicines if diet and exercise are not working.  If you smoke, find out from your health care provider how to quit. If you  do not use tobacco, do not start.  Lung cancer screening is recommended for adults aged 28-80 years who are at high risk for developing lung cancer because of a history of smoking. A yearly low-dose CT scan of the lungs is recommended for people who have at least a 30-pack-year history of smoking and are a current smoker or have quit within the past 15 years. A pack year of smoking is smoking an average of 1 pack of cigarettes a day for 1 year (for example: 1 pack a day for 30 years or 2 packs a day for 15 years). Yearly screening should continue until the smoker has stopped smoking for at least 15 years. Yearly screening should be stopped for people who develop a health problem that would prevent them from having lung cancer treatment.  Avoid use of street drugs. Do not share needles with anyone. Ask for help if you need support or instructions about stopping the use of drugs.  High blood pressure causes heart disease and increases the risk of stroke.  Ongoing high blood pressure should be treated with medicines if weight loss and exercise do not work.  If you are 86-79 years  old, ask your health care provider if you should take aspirin to prevent strokes.  Diabetes screening involves taking a blood sample to check your fasting blood sugar level. This should be done once every 3 years, after age 90, if you are within normal weight and without risk factors for diabetes. Testing should be considered at a younger age or be carried out more frequently if you are overweight and have at least 1 risk factor for diabetes.  Breast cancer screening is essential preventive care for women. You should practice "breast self-awareness." This means understanding the normal appearance and feel of your breasts and may include breast self-examination. Any changes detected, no matter how small, should be reported to a health care provider. Women in their 35s and 30s should have a clinical breast exam (CBE) by a health care provider as part of a regular health exam every 1 to 3 years. After age 70, women should have a CBE every year. Starting at age 23, women should consider having a mammogram (breast X-ray test) every year. Women who have a family history of breast cancer should talk to their health care provider about genetic screening. Women at a high risk of breast cancer should talk to their health care providers about having an MRI and a mammogram every year.  Breast cancer gene (BRCA)-related cancer risk assessment is recommended for women who have family members with BRCA-related cancers. BRCA-related cancers include breast, ovarian, tubal, and peritoneal cancers. Having family members with these cancers may be associated with an increased risk for harmful changes (mutations) in the breast cancer genes BRCA1 and BRCA2. Results of the assessment will determine the need for genetic counseling and BRCA1 and BRCA2 testing.  Routine pelvic exams to screen for cancer are no longer recommended for nonpregnant women who are considered low risk for cancer of the pelvic organs (ovaries, uterus, and  vagina) and who do not have symptoms. Ask your health care provider if a screening pelvic exam is right for you.  If you have had past treatment for cervical cancer or a condition that could lead to cancer, you need Pap tests and screening for cancer for at least 20 years after your treatment. If Pap tests have been discontinued, your risk factors (such as having a new sexual partner) need to be  reassessed to determine if screening should be resumed. Some women have medical problems that increase the chance of getting cervical cancer. In these cases, your health care provider may recommend more frequent screening and Pap tests.    Colorectal cancer can be detected and often prevented. Most routine colorectal cancer screening begins at the age of 50 years and continues through age 75 years. However, your health care provider may recommend screening at an earlier age if you have risk factors for colon cancer. On a yearly basis, your health care provider may provide home test kits to check for hidden blood in the stool. Use of a small camera at the end of a tube, to directly examine the colon (sigmoidoscopy or colonoscopy), can detect the earliest forms of colorectal cancer. Talk to your health care provider about this at age 50, when routine screening begins.  Direct exam of the colon should be repeated every 5-10 years through age 75 years, unless early forms of pre-cancerous polyps or small growths are found.  Osteoporosis is a disease in which the bones lose minerals and strength with aging. This can result in serious bone fractures or breaks. The risk of osteoporosis can be identified using a bone density scan. Women ages 65 years and over and women at risk for fractures or osteoporosis should discuss screening with their health care providers. Ask your health care provider whether you should take a calcium supplement or vitamin D to reduce the rate of osteoporosis.  Menopause can be associated with  physical symptoms and risks. Hormone replacement therapy is available to decrease symptoms and risks. You should talk to your health care provider about whether hormone replacement therapy is right for you.  Use sunscreen. Apply sunscreen liberally and repeatedly throughout the day. You should seek shade when your shadow is shorter than you. Protect yourself by wearing long sleeves, pants, a wide-brimmed hat, and sunglasses year round, whenever you are outdoors.  Once a month, do a whole body skin exam, using a mirror to look at the skin on your back. Tell your health care provider of new moles, moles that have irregular borders, moles that are larger than a pencil eraser, or moles that have changed in shape or color.  Stay current with required vaccines (immunizations).  Influenza vaccine. All adults should be immunized every year.  Tetanus, diphtheria, and acellular pertussis (Td, Tdap) vaccine. Pregnant women should receive 1 dose of Tdap vaccine during each pregnancy. The dose should be obtained regardless of the length of time since the last dose. Immunization is preferred during the 27th-36th week of gestation. An adult who has not previously received Tdap or who does not know her vaccine status should receive 1 dose of Tdap. This initial dose should be followed by tetanus and diphtheria toxoids (Td) booster doses every 10 years. Adults with an unknown or incomplete history of completing a 3-dose immunization series with Td-containing vaccines should begin or complete a primary immunization series including a Tdap dose. Adults should receive a Td booster every 10 years.    Zoster vaccine. One dose is recommended for adults aged 60 years or older unless certain conditions are present.    Pneumococcal 13-valent conjugate (PCV13) vaccine. When indicated, a person who is uncertain of her immunization history and has no record of immunization should receive the PCV13 vaccine. An adult aged 19  years or older who has certain medical conditions and has not been previously immunized should receive 1 dose of PCV13 vaccine. This PCV13   should be followed with a dose of pneumococcal polysaccharide (PPSV23) vaccine. The PPSV23 vaccine dose should be obtained at least 1 or more year(s) after the dose of PCV13 vaccine. An adult aged 19 years or older who has certain medical conditions and previously received 1 or more doses of PPSV23 vaccine should receive 1 dose of PCV13. The PCV13 vaccine dose should be obtained 1 or more years after the last PPSV23 vaccine dose.    Pneumococcal polysaccharide (PPSV23) vaccine. When PCV13 is also indicated, PCV13 should be obtained first. All adults aged 65 years and older should be immunized. An adult younger than age 65 years who has certain medical conditions should be immunized. Any person who resides in a nursing home or long-term care facility should be immunized. An adult smoker should be immunized. People with an immunocompromised condition and certain other conditions should receive both PCV13 and PPSV23 vaccines. People with human immunodeficiency virus (HIV) infection should be immunized as soon as possible after diagnosis. Immunization during chemotherapy or radiation therapy should be avoided. Routine use of PPSV23 vaccine is not recommended for American Indians, [place] Natives, or people younger than 65 years unless there are medical conditions that require PPSV23 vaccine. When indicated, people who have unknown immunization and have no record of immunization should receive PPSV23 vaccine. One-time revaccination 5 years after the first dose of PPSV23 is recommended for people aged 19-64 years who have chronic kidney failure, nephrotic syndrome, asplenia, or immunocompromised conditions. People who received 1-2 doses of PPSV23 before age 65 years should receive another dose of PPSV23 vaccine at age 65 years or later if at least 5 years have passed since the  previous dose. Doses of PPSV23 are not needed for people immunized with PPSV23 at or after age 65 years.   Preventive Services / Frequency  Ages 65 years and over  Blood pressure check.  Lipid and cholesterol check.  Lung cancer screening. / Every year if you are aged 55-80 years and have a 30-pack-year history of smoking and currently smoke or have quit within the past 15 years. Yearly screening is stopped once you have quit smoking for at least 15 years or develop a health problem that would prevent you from having lung cancer treatment.  Clinical breast exam.** / Every year after age 40 years.   BRCA-related cancer risk assessment.** / For women who have family members with a BRCA-related cancer (breast, ovarian, tubal, or peritoneal cancers).  Mammogram.** / Every year beginning at age 40 years and continuing for as long as you are in good health. Consult with your health care provider.  Pap test.** / Every 3 years starting at age 30 years through age 65 or 70 years with 3 consecutive normal Pap tests. Testing can be stopped between 65 and 70 years with 3 consecutive normal Pap tests and no abnormal Pap or HPV tests in the past 10 years.  Fecal occult blood test (FOBT) of stool. / Every year beginning at age 50 years and continuing until age 75 years. You may not need to do this test if you get a colonoscopy every 10 years.  Flexible sigmoidoscopy or colonoscopy.** / Every 5 years for a flexible sigmoidoscopy or every 10 years for a colonoscopy beginning at age 50 years and continuing until age 75 years.  Hepatitis C blood test.** / For all people born from 1945 through 1965 and any individual with known risks for hepatitis C.  Osteoporosis screening.** / A one-time screening for women ages 65   years and over and women at risk for fractures or osteoporosis.  Skin self-exam. / Monthly.  Influenza vaccine. / Every year.  Tetanus, diphtheria, and acellular pertussis (Tdap/Td)  vaccine.** / 1 dose of Td every 10 years.  Zoster vaccine.** / 1 dose for adults aged 21 years or older.  Pneumococcal 13-valent conjugate (PCV13) vaccine.** / Consult your health care provider.  Pneumococcal polysaccharide (PPSV23) vaccine.** / 1 dose for all adults aged 82 years and older. Screening for abdominal aortic aneurysm (AAA)  by ultrasound is recommended for people who have history of high blood pressure or who are current or former smokers. ++++++++++++++++++++ Recommend Adult Low Dose Aspirin or  coated  Aspirin 81 mg daily  To reduce risk of Colon Cancer 40 %,  Skin Cancer 26 % ,  Melanoma 46%  and  Pancreatic cancer 60% ++++++++++++++++++++ Vitamin D goal  is between 70-100.  Please make sure that you are taking your Vitamin D as directed.  It is very important as a natural anti-inflammatory  helping hair, skin, and nails, as well as reducing stroke and heart attack risk.  It helps your bones and helps with mood. It also decreases numerous cancer risks so please take it as directed.  Low Vit D is associated with a 200-300% higher risk for CANCER  and 200-300% higher risk for HEART   ATTACK  &  STROKE.   .....................................Marland Kitchen It is also associated with higher death rate at younger ages,  autoimmune diseases like Rheumatoid arthritis, Lupus, Multiple Sclerosis.    Also many other serious conditions, like depression, Alzheimer's Dementia, infertility, muscle aches, fatigue, fibromyalgia - just to name a few. ++++++++++++++++++ Recommend the book "The END of DIETING" by Dr Excell Seltzer  & the book "The END of DIABETES " by Dr Excell Seltzer At [email or web address] - get book & Audio CD's    Being diabetic has a  300% increased risk for heart attack, stroke, cancer, and alzheimer- type vascular dementia. It is very important that you work harder with diet by avoiding all foods that are white. Avoid white rice (brown & wild rice is OK), white potatoes (sweetpotatoes  in moderation is OK), White bread or wheat bread or anything made out of white flour like bagels, donuts, rolls, buns, biscuits, cakes, pastries, cookies, pizza crust, and pasta (made from white flour & egg whites) - vegetarian pasta or spinach or wheat pasta is OK. Multigrain breads like Arnold's or Pepperidge [employer], or multigrain sandwich thins or flatbreads.  Diet, exercise and weight loss can reverse and cure diabetes in the early stages.  Diet, exercise and weight loss is very important in the control and prevention of complications of diabetes which affects every system in your body, ie. Brain - dementia/stroke, eyes - glaucoma/blindness, heart - heart attack/heart failure, kidneys - dialysis, stomach - gastric paralysis, intestines - malabsorption, nerves - severe painful neuritis, circulation - gangrene & loss of a leg(s), and finally cancer and Alzheimers.    I recommend avoid fried & greasy foods,  sweets/candy, white rice (brown or wild rice or Quinoa is OK), white potatoes (sweet potatoes are OK) - anything made from white flour - bagels, doughnuts, rolls, buns, biscuits,white and wheat breads, pizza crust and traditional pasta made of white flour & egg white(vegetarian pasta or spinach or wheat pasta is OK).  Multi-grain bread is OK - like multi-grain flat bread or sandwich thins. Avoid alcohol in excess. Exercise is also important.    Eat all the vegetables you want -  avoid meat, especially red meat and dairy - especially cheese.  Cheese is the most concentrated form of trans-fats which is the worst thing to clog up our arteries. Veggie cheese is OK which can be found in the fresh produce section at [employer] or [employer] or [employer]  +++++++++++++++++++ DASH Eating Plan  DASH stands for "Dietary Approaches to Stop Hypertension."   The DASH eating plan is a healthy eating plan that has been shown to reduce high blood pressure (hypertension). Additional health benefits may include  reducing the risk of type 2 diabetes mellitus, heart disease, and stroke. The DASH eating plan may also help with weight loss. WHAT DO I NEED TO KNOW ABOUT THE DASH EATING PLAN? For the DASH eating plan, you will follow these general guidelines:  Choose foods with a percent daily value for sodium of less than 5% (as listed on the food label).  Use salt-free seasonings or herbs instead of table salt or sea salt.  Check with your health care provider or pharmacist before using salt substitutes.  Eat lower-sodium products, often labeled as "lower sodium" or "no salt added."  Eat fresh foods.  Eat more vegetables, fruits, and low-fat dairy products.  Choose whole grains. Look for the word "whole" as the first word in the ingredient list.  Choose fish   Limit sweets, desserts, sugars, and sugary drinks.  Choose heart-healthy fats.  Eat veggie cheese   Eat more home-cooked food and less restaurant, buffet, and fast food.  Limit fried foods.  Cook foods using methods other than frying.  Limit canned vegetables. If you do use them, rinse them well to decrease the sodium.  When eating at a restaurant, ask that your food be prepared with less salt, or no salt if possible.                      WHAT FOODS CAN I EAT? Read Dr Fara Olden Fuhrman's books on The End of Dieting & The End of Diabetes  Grains Whole grain or whole wheat bread. Brown rice. Whole grain or whole wheat pasta. Quinoa, bulgur, and whole grain cereals. Low-sodium cereals. Corn or whole wheat flour tortillas. Whole grain cornbread. Whole grain crackers. Low-sodium crackers.  Vegetables Fresh or frozen vegetables (raw, steamed, roasted, or grilled). Low-sodium or reduced-sodium tomato and vegetable juices. Low-sodium or reduced-sodium tomato sauce and paste. Low-sodium or reduced-sodium canned vegetables.   Fruits All fresh, canned (in natural juice), or frozen fruits.  Protein Products  All fish and seafood.  Dried  beans, peas, or lentils. Unsalted nuts and seeds. Unsalted canned beans.  Dairy Low-fat dairy products, such as skim or 1% milk, 2% or reduced-fat cheeses, low-fat ricotta or cottage cheese, or plain low-fat yogurt. Low-sodium or reduced-sodium cheeses.  Fats and Oils Tub margarines without trans fats. Light or reduced-fat mayonnaise and salad dressings (reduced sodium). Avocado. Safflower, olive, or canola oils. Natural peanut or almond butter.  Other Unsalted popcorn and pretzels. The items listed above may not be a complete list of recommended foods or beverages. Contact your dietitian for more options.  +++++++++++++++  WHAT FOODS ARE NOT RECOMMENDED? Grains/ White flour or wheat flour White bread. White pasta. White rice. Refined cornbread. Bagels and croissants. Crackers that contain trans fat.  Vegetables  Creamed or fried vegetables. Vegetables in a . Regular canned vegetables. Regular canned tomato sauce and paste. Regular tomato and vegetable juices.  Fruits Dried fruits. Canned fruit in light or heavy syrup. Fruit juice.  Meat and Other Protein Products Meat in general - RED meat & White meat.  Fatty cuts of meat. Ribs, chicken wings, all processed meats as bacon, sausage, bologna, salami, fatback, hot dogs, bratwurst and packaged luncheon meats.  Dairy Whole or 2% milk, cream, half-and-half, and cream cheese. Whole-fat or sweetened yogurt. Full-fat cheeses or blue cheese. Non-dairy creamers and whipped toppings. Processed cheese, cheese spreads, or cheese curds.  Condiments Onion and garlic salt, seasoned salt, table salt, and sea salt. Canned and packaged gravies. Worcestershire sauce. Tartar sauce. Barbecue sauce. Teriyaki sauce. Soy sauce, including reduced sodium. Steak sauce. Fish sauce. Oyster sauce. Cocktail sauce. Horseradish. Ketchup and mustard. Meat flavorings and tenderizers. Bouillon cubes. Hot sauce. Tabasco sauce. Marinades. Taco seasonings.  Relishes.  Fats and Oils Butter, stick margarine, lard, shortening and bacon fat. Coconut, palm kernel, or palm oils. Regular salad dressings.  Pickles and olives. Salted popcorn and pretzels.  The items listed above may not be a complete list of foods and beverages to avoid.    

## 2019-12-02 NOTE — Progress Notes (Signed)
Annual Screening/Preventative Visit & Comprehensive Evaluation &  Examination     This very nice 79 y.o. WWF  presents for a Screening /Preventative Visit & comprehensive evaluation and management of multiple medical co-morbidities.  Patient has been followed for HTN, HLD, ASHD / PPM, Prediabetes  and Vitamin D Deficiency. Patient reports her GERD is controlled with diet & her meds.      HTN predates since 2005.  In 2014, she had a PPM implanted for CHB by Dr Johney Frame. Then in Dec 2019, she was dx'd with Afib & she was started on Eliquis. Patient's BP has been controlled at home and patient denies any cardiac symptoms as chest pain, palpitations, shortness of breath, dizziness or ankle swelling. Today's BP is at goal - 104/76.      Patient's hyperlipidemia is not controlled with diet and medications. Patient denies myalgias or other medication SE's. Last lipids were not at goal:  Lab Results  Component Value Date   CHOL 189 05/29/2019   HDL 53 05/29/2019   LDLCALC 118 (H) 05/29/2019   TRIG 84 05/29/2019   CHOLHDL 3.6 05/29/2019      Patient has hx/o prediabetes (A1c 5.9% / 2011 & 2016)  and patient denies reactive hypoglycemic symptoms, visual blurring, diabetic polys or paresthesias. Last A1c was not at goal:  Lab Results  Component Value Date   HGBA1C 5.7 (H) 05/29/2019      Finally, patient has history of Vitamin D Deficiency ("34" / 2008)  and last Vitamin D was at goal:  Lab Results  Component Value Date   VD25OH 67 05/29/2019   Current Outpatient Medications on File Prior to Visit  Medication Sig  . ALPRAZolam (XANAX) 1 MG tablet Take 1/2 - 1 tablet 2 - 3 x /day ONLY if needed for Anxiety Attack &  limit to 5 days /week to avoid addiction  . Ascorbic Acid (VITAMIN C PO) Take 1 tablet by mouth daily.   . Calcium Carbonate-Vitamin D (CALCIUM + D PO) Take 1 tablet by mouth daily.   . Cholecalciferol (VITAMIN D PO) Take 5,000 Units by mouth 2 (two) times a day.   . citalopram  (CELEXA) 20 MG tablet Take 1 tablet Daily for Mood  . fluticasone (FLONASE) 50 MCG/ACT nasal spray Place 2 sprays into both nostrils daily. (Patient taking differently: Place 2 sprays into both nostrils daily as needed for allergies or rhinitis. )  . FOLIC ACID PO Take 1 tablet by mouth daily.   Marland Kitchen gabapentin (NEURONTIN) 300 MG capsule Take 1 capsule 3 to 4 x /day for Pain & Headache  . hydrochlorothiazide (HYDRODIURIL) 25 MG tablet Take 25 mg by mouth daily as needed (swelling).   . hydrocortisone (PROCTOZONE-HC) 2.5 % rectal cream PLACE 1 APPLICATION RECTALLY 2 (TWO) TIMES DAILY.  Marland Kitchen ibuprofen (ADVIL) 600 MG tablet Take 1 tablet 3 x /day with food for Pain  . MAGNESIUM PO Take 500 mg by mouth daily.   . metoprolol succinate (TOPROL-XL) 100 MG 24 hr tablet Take 1 tablet Daily for BP & Heart  . Multiple Vitamin (MULTIVITAMIN) tablet Take 1 tablet by mouth daily.  . pantoprazole (PROTONIX) 40 MG tablet Take 1 tablet Daily with Supper for Indigestion & Acid Reflux  . Tretinoin, Facial Wrinkles, (TRETINOIN, EMOLLIENT,) 0.05 % CREA APPY TO AFFECTED AREA EVERY DAY AS DIRECTED   No current facility-administered medications on file prior to visit.   Allergies  Allergen Reactions  . Levsin [Hyoscyamine Sulfate]     unknown  .  Mobic [Meloxicam]     unknown   . Topamax [Topiramate]     unknown   . Zoloft [Sertraline Hcl]     unknown   . Actonel [Risedronate Sodium] Other (See Comments)    Pt didn't like the way it made her feel   . Boniva [Ibandronic Acid] Other (See Comments)    Pt didn't like the way it made her feel  . Evista [Raloxifene] Other (See Comments)    Pt didn't like the way it made her feel  . Fosamax [Alendronate Sodium] Other (See Comments)    Pt didn't like the way it made her feel    Past Medical History:  Diagnosis Date  . Anxiety   . Atrophic vaginitis   . Chest pain    a. Adenosine sestamibi (1/14) with no evidence of ischemia or infarction, EF 63%.   . Colon  polyps   . Complete heart block ([place]) 06-2013   status post pacemaker implantation by Dr Rayann Heman 06-11-2013  . Depression   . DI (detrusor instability)   . GERD (gastroesophageal reflux disease)    History + Hyplori via EGD  . H/O echocardiogram    a. Echo 11/2012:EF 50-55%, focal basal septal hypertrophy with some mitral valve SAM (mild) and MR.  This may be a sigmoid septum with advanced age or could be a variant of hypertrophic cardiomyopathy.  Marland Kitchen HTN (hypertension)   . Hyperlipidemia   . LBBB (left bundle branch block)   . Leukopenia 05/19/2014  . Lymphocytosis 05/19/2014  . Migraines   . Osteoporosis   . Paroxysmal atrial fibrillation (HCC)   . Prediabetes   . Vitamin D deficiency    Health Maintenance  Topic Date Due  . TETANUS/TDAP  05/08/2021  . INFLUENZA VACCINE  Completed  . DEXA SCAN  Completed  . PNA vac Low Risk Adult  Completed   Immunization History  Administered Date(s) Administered  . Fluad Quad(high Dose 65+) 07/25/2019  . Influenza Split 08/06/2014, 09/20/2017  . Influenza, High Dose Seasonal PF 09/01/2015  . Influenza-Unspecified 09/12/2018  . Pneumococcal Conjugate-13 09/01/2015  . Pneumococcal-Unspecified 10/08/2010  . Td 05/09/2011  . 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 BREAST EXCISIONAL BIOPSY Right   . BREAST SURGERY     Breast cyst  . CATARACT EXTRACTION Left 2019   Dr. Katy Fitch  . HEMORRHOID SURGERY    . LEFT HEART CATHETERIZATION WITH CORONARY ANGIOGRAM N/A 06/11/2013   Procedure: LEFT HEART CATHETERIZATION WITH CORONARY ANGIOGRAM;  Surgeon: Burnell Blanks, MD;  Location[facility] C[facility] LAB;  Service: Cardiovascular;  Laterality: N/A;  . PACEMAKER INSERTION  06-11-2013   STJ dual chamber pacemaker implanted by Dr Rayann Heman for complete  heart block  . PERMANENT PACEMAKER INSERTION Left 06/11/2013   Procedure: PERMANENT PACEMAKER INSERTION;  Surgeon: Burnell Blanks, MD;  Location: [facility] CATH LAB;  Service: Cardiovascular;  Laterality: Left;  . PERMANENT PACEMAKER INSERTION N/A 06/11/2013   Procedure: PERMANENT PACEMAKER INSERTION;  Surgeon: Thompson Grayer, MD;  Location: [facility] CATH LAB;  Service: Cardiovascular;  Laterality: N/A;  . TEMPORARY PACEMAKER INSERTION N/A 06/11/2013   Procedure: TEMPORARY PACEMAKER INSERTION;  Surgeon: Burnell Blanks, MD;  Location: [facility] CATH LAB;  Service: Cardiovascular;  Laterality: N/A;  . TUBAL LIGATION  Family History  Problem Relation Age of Onset  . Diabetes Mother   . Heart disease Mother   . Diabetes Brother   . Hypertension Brother   . Heart disease Brother   . Breast cancer Sister 32  . Aneurysm Brother   . Stroke Brother   . Cancer Father        prostate  . Diabetes Brother   . Heart disease Brother    Social History   Tobacco Use  . Smoking status: Former Smoker    Types: Cigarettes    Quit date: 10/09/1975    Years since quitting: 44.1  . Smokeless tobacco: Never Used  Substance Use Topics  . Alcohol use: No  . Drug use: No    ROS Constitutional: Denies fever, chills, weight loss/gain, headaches, insomnia,  night sweats, and change in appetite. Does c/o fatigue. Eyes: Denies redness, blurred vision, diplopia, discharge, itchy, watery eyes.  ENT: Denies discharge, congestion, post nasal drip, epistaxis, sore throat, earache, hearing loss, dental pain, Tinnitus, Vertigo, Sinus pain, snoring.  Cardio: Denies chest pain, palpitations, irregular heartbeat, syncope, dyspnea, diaphoresis, orthopnea, PND, claudication, edema Respiratory: denies cough, dyspnea, DOE, pleurisy, hoarseness, laryngitis, wheezing.  Gastrointestinal: Denies dysphagia, heartburn, reflux, water brash, pain, cramps, nausea, vomiting, bloating, diarrhea, constipation, hematemesis, melena,  hematochezia, jaundice, hemorrhoids Genitourinary: Denies dysuria, frequency, urgency, nocturia, hesitancy, discharge, hematuria, flank pain Breast: Breast lumps, nipple discharge, bleeding.  Musculoskeletal: Denies arthralgia, myalgia, stiffness, Jt. Swelling, pain, limp, and strain/sprain. Denies falls. Skin: Denies puritis, rash, hives, warts, acne, eczema, changing in skin lesion Neuro: No weakness, tremor, incoordination, spasms, paresthesia, pain Psychiatric: Denies confusion, memory loss, sensory loss. Denies Depression. Endocrine: Denies change in weight, skin, hair change, nocturia, and paresthesia, diabetic polys, visual blurring, hyper / hypo glycemic episodes.  Heme/Lymph: No excessive bleeding, bruising, enlarged lymph nodes.  Physical Exam  BP 104/76   Pulse 64   Temp (!) 97.2 F (36.2 C)   Resp 16   Ht 5\' 4"  (1.626 m)   Wt 165 lb 3.2 oz (74.9 kg)   BMI 28.36 kg/m   General Appearance: Well nourished, well groomed and in no apparent distress.  Eyes: PERRLA, EOMs, conjunctiva no swelling or erythema, normal fundi and vessels. Sinuses: No frontal/maxillary tenderness ENT/Mouth: EACs patent / TMs  nl. Nares clear without erythema, swelling, mucoid exudates. Oral hygiene is good. No erythema, swelling, or exudate. Tongue normal, non-obstructing. Tonsils not swollen or erythematous. Hearing normal.  Neck: Supple, thyroid not palpable. No bruits, nodes or JVD. Respiratory: Respiratory effort normal.  BS equal and clear bilateral without rales, rhonci, wheezing or stridor. Cardio: Heart sounds are normal with regular rate and rhythm and no murmurs, rubs or gallops. Peripheral pulses are normal and equal bilaterally without edema. No aortic or femoral bruits. Chest: symmetric with normal excursions and percussion. Breasts: Symmetric, without lumps, nipple discharge, retractions, or fibrocystic changes.  Abdomen: Flat, soft with bowel sounds active. Nontender, no guarding,  rebound, hernias, masses, or organomegaly.  Lymphatics: Non tender without lymphadenopathy.  Musculoskeletal: Full ROM all peripheral extremities, joint stability, 5/5 strength, and normal gait. Skin: Warm and dry without rashes, lesions, cyanosis, clubbing or  ecchymosis.  Neuro: Cranial nerves intact, reflexes equal bilaterally. Normal muscle tone, no cerebellar symptoms. Sensation intact.  Pysch: Alert and oriented X 3, normal affect, Insight and Judgment appropriate.   Assessment and Plan  1. Annual Preventative Screening Examination  2. Essential hypertension  - EKG 12-Lead - Urinalysis, Routine w reflex microscopic - Microalbumin / creatinine  urine ratio - CBC with Differential/Platelet - COMPLETE METABOLIC PANEL WITH GFR - Magnesium - TSH  3. Hyperlipidemia, mixed  - EKG 12-Lead - Lipid panel - TSH  4. Abnormal glucose  - EKG 12-Lead - Hemoglobin A1c - Insulin, random  5. Vitamin D deficiency  - VITAMIN D 25 Hydroxy   6. Age-related osteoporosis without current pathological fracture   7. Paroxysmal atrial fibrillation (HCC)  - EKG 12-Lead - TSH  8. CHB (complete heart block) (HCC)  - EKG 12-Lead  9. Pacemaker  - EKG 12-Lead  10. Screening for ischemic heart disease  - EKG 12-Lead  11. FHx: heart disease  - EKG 12-Lead  12. Former smoker  - EKG 12-Lead  13. Medication management  - Urinalysis, Routine w reflex microscopic - Microalbumin / creatinine urine ratio - CBC with Differential/Platelet - COMPLETE METABOLIC PANEL WITH GFR - Magnesium - Lipid panel - TSH - Hemoglobin A1c - Insulin, random - VITAMIN D 25 Hydroxy         Patient was counseled in prudent diet to achieve/maintain BMI less than 25 for weight control, BP monitoring, regular exercise and medications. Discussed med's effects and SE's. Screening labs and tests as requested with regular follow-up as recommended. Over 40 minutes of exam, counseling, chart review and high  complex critical decision making was performed.   Marinus Maw, MD

## 2019-12-03 ENCOUNTER — Encounter: Payer: Self-pay | Admitting: Internal Medicine

## 2019-12-03 ENCOUNTER — Ambulatory Visit (INDEPENDENT_AMBULATORY_CARE_PROVIDER_SITE_OTHER): Payer: Medicare PPO | Admitting: Internal Medicine

## 2019-12-03 ENCOUNTER — Other Ambulatory Visit: Payer: Self-pay

## 2019-12-03 VITALS — BP 104/76 | HR 64 | Temp 97.2°F | Resp 16 | Ht 64.0 in | Wt 165.2 lb

## 2019-12-03 DIAGNOSIS — Z8249 Family history of ischemic heart disease and other diseases of the circulatory system: Secondary | ICD-10-CM

## 2019-12-03 DIAGNOSIS — Z0000 Encounter for general adult medical examination without abnormal findings: Secondary | ICD-10-CM | POA: Diagnosis not present

## 2019-12-03 DIAGNOSIS — I480 Paroxysmal atrial fibrillation: Secondary | ICD-10-CM | POA: Diagnosis not present

## 2019-12-03 DIAGNOSIS — Z79899 Other long term (current) drug therapy: Secondary | ICD-10-CM

## 2019-12-03 DIAGNOSIS — G44219 Episodic tension-type headache, not intractable: Secondary | ICD-10-CM

## 2019-12-03 DIAGNOSIS — E559 Vitamin D deficiency, unspecified: Secondary | ICD-10-CM | POA: Diagnosis not present

## 2019-12-03 DIAGNOSIS — Z0001 Encounter for general adult medical examination with abnormal findings: Secondary | ICD-10-CM

## 2019-12-03 DIAGNOSIS — R7309 Other abnormal glucose: Secondary | ICD-10-CM

## 2019-12-03 DIAGNOSIS — Z87891 Personal history of nicotine dependence: Secondary | ICD-10-CM

## 2019-12-03 DIAGNOSIS — E782 Mixed hyperlipidemia: Secondary | ICD-10-CM

## 2019-12-03 DIAGNOSIS — I10 Essential (primary) hypertension: Secondary | ICD-10-CM

## 2019-12-03 DIAGNOSIS — Z950 Presence of cardiac pacemaker: Secondary | ICD-10-CM

## 2019-12-03 DIAGNOSIS — Z136 Encounter for screening for cardiovascular disorders: Secondary | ICD-10-CM

## 2019-12-03 DIAGNOSIS — I442 Atrioventricular block, complete: Secondary | ICD-10-CM

## 2019-12-03 DIAGNOSIS — M810 Age-related osteoporosis without current pathological fracture: Secondary | ICD-10-CM

## 2019-12-03 MED ORDER — GABAPENTIN 100 MG PO CAPS: ORAL_CAPSULE | ORAL | 1 refills | Status: DC

## 2019-12-04 LAB — CBC WITH DIFFERENTIAL/PLATELET
Absolute Monocytes: 342 cells/uL (ref 200–950)
Basophils Absolute: 51 cells/uL (ref 0–200)
Basophils Relative: 1.6 %
Eosinophils Absolute: 109 cells/uL (ref 15–500)
Eosinophils Relative: 3.4 %
HCT: 39.3 % (ref 35.0–45.0)
Hemoglobin: 12.6 g/dL (ref 11.7–15.5)
Lymphs Abs: 1523 cells/uL (ref 850–3900)
MCH: 28 pg (ref 27.0–33.0)
MCHC: 32.1 g/dL (ref 32.0–36.0)
MCV: 87.3 fL (ref 80.0–100.0)
MPV: 10.8 fL (ref 7.5–12.5)
Monocytes Relative: 10.7 %
Neutro Abs: 1174 cells/uL — ABNORMAL LOW (ref 1500–7800)
Neutrophils Relative %: 36.7 %
Platelets: 200 10*3/uL (ref 140–400)
RBC: 4.5 10*6/uL (ref 3.80–5.10)
RDW: 12.3 % (ref 11.0–15.0)
Total Lymphocyte: 47.6 %
WBC: 3.2 10*3/uL — ABNORMAL LOW (ref 3.8–10.8)

## 2019-12-04 LAB — URINALYSIS, ROUTINE W REFLEX MICROSCOPIC
Bacteria, UA: NONE SEEN /HPF
Bilirubin Urine: NEGATIVE
Glucose, UA: NEGATIVE
Hgb urine dipstick: NEGATIVE
Hyaline Cast: NONE SEEN /LPF
Ketones, ur: NEGATIVE
Nitrite: NEGATIVE
Protein, ur: NEGATIVE
RBC / HPF: NONE SEEN /HPF (ref 0–2)
Specific Gravity, Urine: 1.017 (ref 1.001–1.03)
Squamous Epithelial / HPF: NONE SEEN /HPF (ref <=5)
WBC, UA: NONE SEEN /HPF (ref 0–5)
pH: 8 (ref 5.0–8.0)

## 2019-12-04 LAB — COMPLETE METABOLIC PANEL WITH GFR
AG Ratio: 1.8 (calc) (ref 1.0–2.5)
ALT: 13 U/L (ref 6–29)
AST: 16 U/L (ref 10–35)
Albumin: 4.1 g/dL (ref 3.6–5.1)
Alkaline phosphatase (APISO): 80 U/L (ref 37–153)
BUN: 19 mg/dL (ref 7–25)
CO2: 30 mmol/L (ref 20–32)
Calcium: 9.5 mg/dL (ref 8.6–10.4)
Chloride: 107 mmol/L (ref 98–110)
Creat: 0.91 mg/dL (ref 0.60–0.93)
GFR, Est African American: 70 mL/min/{1.73_m2} (ref >=60)
GFR, Est Non African American: 60 mL/min/{1.73_m2} (ref >=60)
Globulin: 2.3 g/dL (calc) (ref 1.9–3.7)
Glucose, Bld: 92 mg/dL (ref 65–99)
Potassium: 4.5 mmol/L (ref 3.5–5.3)
Sodium: 142 mmol/L (ref 135–146)
Total Bilirubin: 0.5 mg/dL (ref 0.2–1.2)
Total Protein: 6.4 g/dL (ref 6.1–8.1)

## 2019-12-04 LAB — HEMOGLOBIN A1C
Hgb A1c MFr Bld: 5.8 % of total Hgb — ABNORMAL HIGH (ref <5.7)
Mean Plasma Glucose: 120 (calc)
eAG (mmol/L): 6.6 (calc)

## 2019-12-04 LAB — LIPID PANEL
Cholesterol: 174 mg/dL (ref <200)
HDL: 48 mg/dL — ABNORMAL LOW (ref >=50)
LDL Cholesterol (Calc): 107 mg/dL (calc) — ABNORMAL HIGH
Non-HDL Cholesterol (Calc): 126 mg/dL (calc) (ref <130)
Total CHOL/HDL Ratio: 3.6 (calc) (ref <5.0)
Triglycerides: 94 mg/dL (ref <150)

## 2019-12-04 LAB — TSH: TSH: 1.48 mIU/L (ref 0.40–4.50)

## 2019-12-04 LAB — MICROALBUMIN / CREATININE URINE RATIO
Creatinine, Urine: 106 mg/dL (ref 20–275)
Microalb Creat Ratio: 15 mcg/mg creat (ref <30)
Microalb, Ur: 1.6 mg/dL

## 2019-12-04 LAB — MAGNESIUM: Magnesium: 2.3 mg/dL (ref 1.5–2.5)

## 2019-12-04 LAB — VITAMIN D 25 HYDROXY (VIT D DEFICIENCY, FRACTURES): Vit D, 25-Hydroxy: 75 ng/mL (ref 30–100)

## 2019-12-04 LAB — INSULIN, RANDOM: Insulin: 4.7 u[IU]/mL

## 2019-12-11 ENCOUNTER — Other Ambulatory Visit: Payer: Self-pay | Admitting: Internal Medicine

## 2019-12-15 NOTE — Progress Notes (Signed)
Cardiology Office Note Date:  12/17/2019  Patient ID:  Alexandra Wheeler, Alexandra Wheeler [birth year], MRN [id number] PCP:  Lucky Cowboy, MD  Cardiologist:  Dr. Shirlee Latch Electrophysiologist: Dr. Johney Frame    Chief Complaint: annual EP visit  History of Present Illness: Alexandra Wheeler is a 79 y.o. female with history of Afib, CHB w/PPM, HTN, HLD.  She comes in today to be seen for Dr. Ali Lowe seen by him Feb 2020.  At that time doing well, AF burden <1%, no changes were made.  Discussed possibility of stopping a/c if burden remained low at her next visit  She is doing well.  Denies any CP, palpitations or cardiac awareness, no near syncope or syncope, no SOB  She is not exercising but reports she stays moving and busy, recently moved, was very active packing, unpacking, cleaning without exertional intolerances   Afib hx Diagnosed Nov 2019 AAD None to date  Device information SJM dual chamber PPM, implanted 06/11/2013  Past Medical History:  Diagnosis Date  . Anxiety   . Atrophic vaginitis   . Chest pain    a. Adenosine sestamibi (1/14) with no evidence of ischemia or infarction, EF 63%.   . Colon polyps   . Complete heart block (HCC) 06-2013   status post pacemaker implantation by Dr Johney Frame 06-11-2013  . Depression   . DI (detrusor instability)   . GERD (gastroesophageal reflux disease)    History + Hyplori via EGD  . H/O echocardiogram    a. Echo 11/2012:EF 50-55%, focal basal septal hypertrophy with some mitral valve SAM (mild) and MR.  This may be a sigmoid septum with advanced age or could be a variant of hypertrophic cardiomyopathy.  Marland Kitchen HTN (hypertension)   . Hyperlipidemia   . LBBB (left bundle branch block)   . Leukopenia 05/19/2014  . Lymphocytosis 05/19/2014  . Migraines   . Osteoporosis   . Paroxysmal atrial fibrillation (HCC)   . Prediabetes   . Vitamin D deficiency     Past Surgical History:  Procedure Laterality Date  . BREAST EXCISIONAL BIOPSY Right     . BREAST SURGERY     Breast cyst  . CATARACT EXTRACTION Left 2019   Dr. Dione Booze  . HEMORRHOID SURGERY    . LEFT HEART CATHETERIZATION WITH CORONARY ANGIOGRAM N/A 06/11/2013   Procedure: LEFT HEART CATHETERIZATION WITH CORONARY ANGIOGRAM;  Surgeon: Kathleene Hazel, MD;  Location: [facility] CATH LAB;  Service: Cardiovascular;  Laterality: N/A;  . PACEMAKER INSERTION  06-11-2013   STJ dual chamber pacemaker implanted by Dr Johney Frame for complete heart block  . PERMANENT PACEMAKER INSERTION Left 06/11/2013   Procedure: PERMANENT PACEMAKER INSERTION;  Surgeon: Kathleene Hazel, MD;  Location: [facility] CATH LAB;  Service: Cardiovascular;  Laterality: Left;  . PERMANENT PACEMAKER INSERTION N/A 06/11/2013   Procedure: PERMANENT PACEMAKER INSERTION;  Surgeon: Hillis Range, MD;  Location: [facility] CATH LAB;  Service: Cardiovascular;  Laterality: N/A;  . TEMPORARY PACEMAKER INSERTION N/A 06/11/2013   Procedure: TEMPORARY PACEMAKER INSERTION;  Surgeon: Kathleene Hazel, MD;  Location: [facility] CATH LAB;  Service: Cardiovascular;  Laterality: N/A;  . TUBAL LIGATION      Current Outpatient Medications  Medication Sig Dispense Refill  . ALPRAZolam (XANAX) 1 MG tablet Take 1/2 - 1 tablet 2 - 3 x /day ONLY if needed for Anxiety Attack &  limit to 5 days /week to avoid addiction 90 tablet 0  . Ascorbic Acid (VITAMIN C PO) Take 1 tablet by mouth daily.     Marland Kitchen  Calcium Carbonate-Vitamin D (CALCIUM + D PO) Take 1 tablet by mouth daily.     . Cholecalciferol (VITAMIN D PO) Take 5,000 Units by mouth 2 (two) times a day.     . citalopram (CELEXA) 20 MG tablet Take 1 tablet Daily for Mood 90 tablet 3  . fluticasone (FLONASE) 50 MCG/ACT nasal spray Place 2 sprays into both nostrils daily. (Patient taking differently: Place 2 sprays into both nostrils daily as needed for allergies or rhinitis. ) 16 g 0  . FOLIC ACID PO Take 1 tablet by mouth daily.     Marland Kitchen gabapentin (NEURONTIN) 100 MG capsule Take 1 to 2 capsules   2 to 3 x /day as  needed for Headache or Pain 180 capsule 1  . gabapentin (NEURONTIN) 300 MG capsule Take 1 capsule 3 to 4 x /day for Pain & Headache 360 capsule 1  . hydrochlorothiazide (HYDRODIURIL) 25 MG tablet Take 25 mg by mouth daily as needed (swelling).     . hydrocortisone (PROCTOZONE-HC) 2.5 % rectal cream PLACE 1 APPLICATION RECTALLY 2 (TWO) TIMES DAILY. 28.35 g 5  . ibuprofen (IBU) 600 MG tablet Take 1 tablet 3 x  /day with food for Inflammation & Pain 270 tablet 1  . MAGNESIUM PO Take 500 mg by mouth daily.     . metoprolol succinate (TOPROL-XL) 100 MG 24 hr tablet Take 1 tablet Daily for BP & Heart 90 tablet 3  . Multiple Vitamin (MULTIVITAMIN) tablet Take 1 tablet by mouth daily.    . pantoprazole (PROTONIX) 40 MG tablet Take 1 tablet Daily with Supper for Indigestion & Acid Reflux 90 tablet 3  . Tretinoin, Facial Wrinkles, (TRETINOIN, EMOLLIENT,) 0.05 % CREA APPY TO AFFECTED AREA EVERY DAY AS DIRECTED 60 g 99   No current facility-administered medications for this visit.    Allergies:   Levsin [hyoscyamine sulfate], Mobic [meloxicam], Topamax [topiramate], Zoloft [sertraline hcl], Actonel [risedronate sodium], Boniva [ibandronic acid], Evista [raloxifene], and Fosamax [alendronate sodium]   Social History:  The patient  reports that she quit smoking about 44 years ago. Her smoking use included cigarettes. She has never used smokeless tobacco. She reports that she does not drink alcohol or use drugs.   Family History:  The patient's family history includes Aneurysm in her brother; Breast cancer (age of onset: 59) in her sister; Cancer in her father; Diabetes in her brother, brother, and mother; Heart disease in her brother, brother, and mother; Hypertension in her brother; Stroke in her brother.  ROS:  Please see the history of present illness.   All other systems are reviewed and otherwise negative.   PHYSICAL EXAM: VS:  BP 122/88   Pulse 64   Ht 5\' 5"  (1.651 m)   Wt 165 lb (74.8 kg)   SpO2  98%   BMI 27.46 kg/m  BMI: Body mass index is 27.46 kg/m. Well nourished, well developed, in no acute distress  HEENT: normocephalic, atraumatic  Neck: no JVD, carotid bruits or masses Cardiac:  RRR; no significant murmurs, no rubs, or gallops Lungs:  CTA b/l, no wheezing, rhonchi or rales  Abd: soft, nontender MS: no deformity o atrophy Ext: no edema  Skin: warm and dry, no rash Neuro:  No gross deficits appreciated Psych: euthymic mood, full affect  PPM site is stable, no tenderness, fluctuation or tethering   EKG:  Not done today 12/02/2019 reviewed looks AV paced  PPM interrogation done today and reviewed by myself:  Battery and lead measurements are good  2 AMS 08/22/19 8 second Atach 05/15/2019 4 sec Aflutter/Atach Device dependent today AP 90% VP 99%   03/10/2017: TTE Study Conclusions  - Left ventricle: The cavity size was normal. There was severe  focal basal hypertrophy of the septum and moderate hypertrophy of  the remaining myocardium. Systolic function was normal. The  estimated ejection fraction was in the range of 60% to 65%. Wall  motion was normal; there were no regional wall motion  abnormalities. Doppler parameters are consistent with abnormal  left ventricular relaxation (grade 1 diastolic dysfunction).  Doppler parameters are consistent with elevated ventricular  end-diastolic filling pressure.  - Aortic valve: There was mild regurgitation.  - Mitral valve: There was trivial regurgitation.  - Right ventricle: The cavity size was normal. Wall thickness was  normal. Systolic function was normal.  - Right atrium: The atrium was normal in size.  - Tricuspid valve: There was moderate regurgitation.  - Pulmonary arteries: Systolic pressure was mildly increased. PA  peak pressure: 38 mm Hg (S).  - Inferior vena cava: The vessel was normal in size.  - Pericardium, extracardiac: There was no pericardial effusion.   Impressions:   -  Findings consistent with HCM. No difference since the study on  07/31/2015.     11.30.2016: ETT Fair exercise capacity.  No chest pain.  Normal BP response to exercise (139/91 >> 170/99 >> 169/94). Paced rhythm.  ECG could not be interpreted. There were frequent PVCs. No exercise induced ventricular arrhythmias.     Recent Labs: 12/03/2019: ALT 13; BUN 19; Creat 0.91; Hemoglobin 12.6; Magnesium 2.3; Platelets 200; Potassium 4.5; Sodium 142; TSH 1.48  12/03/2019: Cholesterol 174; HDL 48; LDL Cholesterol (Calc) 107; Total CHOL/HDL Ratio 3.6; Triglycerides 94   Estimated Creatinine Clearance: 50.7 mL/min (by C-G formula based on SCr of 0.91 mg/dL).   Wt Readings from Last 3 Encounters:  12/17/19 165 lb (74.8 kg)  12/03/19 165 lb 3.2 oz (74.9 kg)  05/29/19 164 lb 6.4 oz (74.6 kg)     Other studies reviewed: Additional studies/records reviewed today include: summarized above  ASSESSMENT AND PLAN:  1. PPM     Intact function, no programming changes made  2. Paroxysmal Afib     CHA2DS2Vasc is 4, no longer on a/c     No AFib noted     <1% AMS described above  3. HTN     BB was up-titrated by her PMD recently     Looks good    Discussed heart healthy lifestyle, her recent lipid profile looks good    Disposition: F/u with Q 3 mo remotes, in clinic in 1 year, sooner if needed  Current medicines are reviewed at length with the patient today.  The patient did not have any concerns regarding medicines.  Venetia Night, PA-C 12/17/2019 11:36 AM     [facility] [address] [place] [address] [phone] (office)  [phone] (fax)

## 2019-12-17 ENCOUNTER — Other Ambulatory Visit: Payer: Self-pay

## 2019-12-17 ENCOUNTER — Ambulatory Visit: Payer: Medicare PPO | Admitting: Physician Assistant

## 2019-12-17 VITALS — BP 122/88 | HR 64 | Ht 65.0 in | Wt 165.0 lb

## 2019-12-17 DIAGNOSIS — I10 Essential (primary) hypertension: Secondary | ICD-10-CM | POA: Diagnosis not present

## 2019-12-17 DIAGNOSIS — I442 Atrioventricular block, complete: Secondary | ICD-10-CM | POA: Diagnosis not present

## 2019-12-17 DIAGNOSIS — Z950 Presence of cardiac pacemaker: Secondary | ICD-10-CM

## 2019-12-17 DIAGNOSIS — I480 Paroxysmal atrial fibrillation: Secondary | ICD-10-CM

## 2019-12-17 NOTE — Patient Instructions (Signed)
Medication Instructions:  Your physician recommends that you continue on your current medications as directed. Please refer to the Current Medication list given to you today.  *If you need a refill on your cardiac medications before your next appointment, please call your pharmacy*  Lab Work: NONE ORDERED  TODAY  If you have labs (blood work) drawn today and your tests are completely normal, you will receive your results only by: . MyChart Message (if you have MyChart) OR . A paper copy in the mail If you have any lab test that is abnormal or we need to change your treatment, we will call you to review the results.  Testing/Procedures: NONE ORDERED  TODAY   Follow-Up: At [facility], you and your health needs are our priority.  As part of our continuing mission to provide you with exceptional heart care, we have created designated Provider Care Teams.  These Care Teams include your primary Cardiologist (physician) and Advanced Practice Providers (APPs -  Physician Assistants and Nurse Practitioners) who all work together to provide you with the care you need, when you need it.  Your next appointment:   1 year(s)  The format for your next appointment:   In Person  Provider:   You may see   one of the following Advanced Practice Providers on your designated Care Team:    Amber Seiler, NP  Renee Ursuy, PA-C  Michael "Andy" Tillery, PA-C   Other Instructions   

## 2020-01-08 ENCOUNTER — Ambulatory Visit (INDEPENDENT_AMBULATORY_CARE_PROVIDER_SITE_OTHER): Payer: Medicare PPO | Admitting: *Deleted

## 2020-01-08 DIAGNOSIS — I442 Atrioventricular block, complete: Secondary | ICD-10-CM

## 2020-01-08 LAB — CUP PACEART REMOTE DEVICE CHECK
Battery Remaining Longevity: 97 mo
Battery Remaining Percentage: 80 %
Battery Voltage: 2.93 V
Brady Statistic AP VP Percent: 96 %
Brady Statistic AP VS Percent: 1 %
Brady Statistic AS VP Percent: 4 %
Brady Statistic AS VS Percent: 1 %
Brady Statistic RA Percent Paced: 95 %
Brady Statistic RV Percent Paced: 99 %
Date Time Interrogation Session: 20210303020013
Implantable Lead Implant Date: 20140805
Implantable Lead Implant Date: 20140805
Implantable Lead Location: 753859
Implantable Lead Location: 753860
Implantable Lead Model: 1944
Implantable Lead Model: 1948
Implantable Pulse Generator Implant Date: 20140805
Lead Channel Impedance Value: 510 Ohm
Lead Channel Impedance Value: 590 Ohm
Lead Channel Pacing Threshold Amplitude: 0.5 V
Lead Channel Pacing Threshold Amplitude: 0.75 V
Lead Channel Pacing Threshold Pulse Width: 0.4 ms
Lead Channel Pacing Threshold Pulse Width: 0.4 ms
Lead Channel Sensing Intrinsic Amplitude: 12 mV
Lead Channel Sensing Intrinsic Amplitude: 2.4 mV
Lead Channel Setting Pacing Amplitude: 1 V
Lead Channel Setting Pacing Amplitude: 2 V
Lead Channel Setting Pacing Pulse Width: 0.4 ms
Lead Channel Setting Sensing Sensitivity: 4 mV
Pulse Gen Model: 2240
Pulse Gen Serial Number: 7523768

## 2020-01-08 NOTE — Progress Notes (Signed)
PPM Remote  

## 2020-01-13 ENCOUNTER — Other Ambulatory Visit (INDEPENDENT_AMBULATORY_CARE_PROVIDER_SITE_OTHER): Payer: Medicare PPO

## 2020-01-13 DIAGNOSIS — Z1211 Encounter for screening for malignant neoplasm of colon: Secondary | ICD-10-CM | POA: Diagnosis not present

## 2020-01-13 DIAGNOSIS — Z1212 Encounter for screening for malignant neoplasm of rectum: Secondary | ICD-10-CM

## 2020-01-13 LAB — POC HEMOCCULT BLD/STL (HOME/3-CARD/SCREEN)
Card #2 Fecal Occult Blod, POC: NEGATIVE
Card #3 Fecal Occult Blood, POC: NEGATIVE
Fecal Occult Blood, POC: NEGATIVE

## 2020-01-23 ENCOUNTER — Other Ambulatory Visit: Payer: Self-pay | Admitting: Internal Medicine

## 2020-01-23 DIAGNOSIS — R450 Nervousness: Secondary | ICD-10-CM

## 2020-01-23 MED ORDER — ALPRAZOLAM 1 MG PO TABS: ORAL_TABLET | ORAL | 0 refills | Status: DC

## 2020-02-10 ENCOUNTER — Telehealth: Payer: Self-pay | Admitting: Internal Medicine

## 2020-02-10 NOTE — Telephone Encounter (Signed)
Patient lvm requesting referral. Called back patient- lvm to call back with details.

## 2020-02-12 ENCOUNTER — Ambulatory Visit (INDEPENDENT_AMBULATORY_CARE_PROVIDER_SITE_OTHER): Payer: Medicare PPO | Admitting: Physician Assistant

## 2020-02-12 ENCOUNTER — Encounter: Payer: Self-pay | Admitting: Physician Assistant

## 2020-02-12 ENCOUNTER — Other Ambulatory Visit: Payer: Self-pay

## 2020-02-12 VITALS — BP 124/86 | HR 63 | Temp 98.1°F | Wt 163.0 lb

## 2020-02-12 DIAGNOSIS — E611 Iron deficiency: Secondary | ICD-10-CM | POA: Diagnosis not present

## 2020-02-12 DIAGNOSIS — E782 Mixed hyperlipidemia: Secondary | ICD-10-CM

## 2020-02-12 DIAGNOSIS — I421 Obstructive hypertrophic cardiomyopathy: Secondary | ICD-10-CM | POA: Diagnosis not present

## 2020-02-12 DIAGNOSIS — Z0001 Encounter for general adult medical examination with abnormal findings: Secondary | ICD-10-CM

## 2020-02-12 DIAGNOSIS — I447 Left bundle-branch block, unspecified: Secondary | ICD-10-CM

## 2020-02-12 DIAGNOSIS — G96198 Other disorders of meninges, not elsewhere classified: Secondary | ICD-10-CM

## 2020-02-12 DIAGNOSIS — Z7901 Long term (current) use of anticoagulants: Secondary | ICD-10-CM

## 2020-02-12 DIAGNOSIS — N952 Postmenopausal atrophic vaginitis: Secondary | ICD-10-CM

## 2020-02-12 DIAGNOSIS — M2550 Pain in unspecified joint: Secondary | ICD-10-CM | POA: Diagnosis not present

## 2020-02-12 DIAGNOSIS — R7309 Other abnormal glucose: Secondary | ICD-10-CM

## 2020-02-12 DIAGNOSIS — R6889 Other general symptoms and signs: Secondary | ICD-10-CM

## 2020-02-12 DIAGNOSIS — I10 Essential (primary) hypertension: Secondary | ICD-10-CM

## 2020-02-12 DIAGNOSIS — I480 Paroxysmal atrial fibrillation: Secondary | ICD-10-CM

## 2020-02-12 DIAGNOSIS — E559 Vitamin D deficiency, unspecified: Secondary | ICD-10-CM | POA: Diagnosis not present

## 2020-02-12 DIAGNOSIS — I442 Atrioventricular block, complete: Secondary | ICD-10-CM

## 2020-02-12 DIAGNOSIS — R519 Headache, unspecified: Secondary | ICD-10-CM

## 2020-02-12 DIAGNOSIS — Z0000 Encounter for general adult medical examination without abnormal findings: Secondary | ICD-10-CM

## 2020-02-12 DIAGNOSIS — E663 Overweight: Secondary | ICD-10-CM

## 2020-02-12 DIAGNOSIS — E538 Deficiency of other specified B group vitamins: Secondary | ICD-10-CM

## 2020-02-12 DIAGNOSIS — K219 Gastro-esophageal reflux disease without esophagitis: Secondary | ICD-10-CM

## 2020-02-12 DIAGNOSIS — G44219 Episodic tension-type headache, not intractable: Secondary | ICD-10-CM

## 2020-02-12 DIAGNOSIS — M810 Age-related osteoporosis without current pathological fracture: Secondary | ICD-10-CM

## 2020-02-12 DIAGNOSIS — G8929 Other chronic pain: Secondary | ICD-10-CM

## 2020-02-12 DIAGNOSIS — F330 Major depressive disorder, recurrent, mild: Secondary | ICD-10-CM

## 2020-02-12 DIAGNOSIS — Z950 Presence of cardiac pacemaker: Secondary | ICD-10-CM

## 2020-02-12 DIAGNOSIS — Z79899 Other long term (current) drug therapy: Secondary | ICD-10-CM | POA: Diagnosis not present

## 2020-02-12 NOTE — Progress Notes (Signed)
MEDICARE ANNUAL WELLNESS VISIT AND FOLLOW UP  Assessment:   Episodic tension-type headache, not intractable Check ESR- rule out temporal artheritis with PMR- no weight loss/fever, chills- some shoulder/hip discomfort/weakness ? From cervical Pseudomeningocele - will request Alexandra Wheeler notes Normal neuro- had CT head normal 2016 Suggest PT for headache- can try dry needling   Polyarthralgia -     Sedimentation rate -     C-reactive protein -     Rheumatoid factor  Iron deficiency -     Iron,Total/Total Iron Binding Cap -     Ferritin  B12 deficiency -     Vitamin B12  Pseudomeningocele of spinal cord -     Ambulatory referral to Physical Therapy ? If contributing to pain- will try to get notes Patient wants 2nd opinion at [facility] for Medicare annual wellness exam Continue annually   LBBB (left bundle branch block) Followed by cardiology; s/p pacemaker  Hypertrophic obstructive cardiomyopathy ([place]) Weights stable; followed by cardiology; manage BP  Essential hypertension Continue medications Monitor blood pressure at home; call if consistently over 130/80 Continue DASH diet.   Reminder to go to the ER if any CP, SOB, nausea, dizziness, severe HA, changes vision/speech, left arm numbness and tingling and jaw pain.  CHB (complete heart block) (HCC) S/p pacemaker; followed by cardiology  Major depressive disorder, recurrent episode, mild with anxious distress ([place]) Continue medications; fairly controlled; reminded to avoid daily benzo use; she is working on cutting down  Lifestyle discussed: diet/exerise, sleep hygiene, stress management, hydration  Age-related osteoporosis without current pathological fracture - get dexa fall 2020, continue Vit D and Ca, emphasized weight bearing exercises Couldn't tolerate bisphosphonates  Atrophic vaginitis Monitor; topicals PRN  Paroxysmal atrial fibrillation (HCC) No concerns with excessive bleeding, no  falls Continue medication: elequis Followed by cardiology  Vitamin D deficiency Below goal last visit; she did increase dose continue to recommend supplementation for goal of 60-100 Check vitamin D level annually or as needed  Other abnormal glucose Discussed disease and risks Discussed diet/exercise, weight management  A1C, insulin level routinely   Medication management CBC, CMP at 42mV  Mixed hyperlipidemia Mild elevations not on meds; working on lifestyle Continue low cholesterol diet and exercise.  Check lipid panel.   Gastroesophageal reflux disease, esophagitis presence not specified Well managed by lifestyle at this time Discussed diet, avoiding triggers and other lifestyle changes  Pacemaker Followed by Cardiology  Chronic anticoagulation No signs of excessive bleeding; reminded to present to ED for any trauma or head injuries   Over 40 minutes of exam, counseling, chart review and critical decision making was performed Future Appointments  Date Time Provider Alexandra Wheeler 03/05/2020 11:15 AM CLiane Comber NP [facility] None  04/08/2020  8:10 AM [facility] DEVICE REMOTES [facility] Alexandra Wheeler  06/08/2020 10:30 AM Alexandra Pinto MD [facility] None  07/08/2020  8:10 AM [facility] DEVICE REMOTES [facility] Alexandra Wheeler  10/07/2020  8:10 AM [facility] DEVICE REMOTES [facility] Alexandra Wheeler  12/16/2020  9:00 AM Alexandra Pinto MD [facility] None     Plan:   During the course of the visit the patient was educated and counseled about appropriate screening and preventive services including:    Pneumococcal vaccine   Prevnar 13  Influenza vaccine  Td vaccine  Screening electrocardiogram  Bone densitometry screening  Colorectal cancer screening  Diabetes screening  Glaucoma screening  Nutrition counseling   Advanced directives: requested   Subjective:  Alexandra Wheeler a 79y.o. female who presents for Medicare Annual  Wellness Visit and acute visit.   She has a history of following with Alexandra Wheeler for chronic headaches and has seen Alexandra Wheeler for the pain being possibly related to cervical arthritis. She had a cervical myelogram in June that showed a pseudomeningicele and CT with and without contrast in June presents today with severe right sided ear pain x 1 week but states she has had it for years. It has been worse the last week. Feels like something is "sitting" on the back of her head. She goes to bed with a HA and wakes up with a HA however for the past week she is having pain in her ear and right sided dry blood in her right nasal.   No fever, chills, ringing in ears, tinnitus. She feels off balance but at her baseline. No changes in her vision.   Normal Sed rate 2015 She is on tylenol, iburprofen, gabapentin,  CT HEad 2016- no Bud Chair malformation IMPRESSION: 1. No acute intracranial abnormality or significant interval change. 2. Stable mild atrophy and white matter disease. 3. No pathologic enhancement.  CT CERVICAL MYELOGRAM FINDINGS:  Alignment: 2 mm of facet mediated anterolisthesis at C3-4. 1-2 mm facet mediated anterolisthesis at C7-T1. Otherwise anatomic.  Vertebrae: Skeletal osteopenia. No fracture or worrisome osseous lesion.  Cord: No cord compression. Cord appears normal in size. No intraspinal mass lesion.  Posterior Fossa: No tonsillar herniation.  Paraspinal tissues: Large meningoceles are seen bilaterally, widening the neural foramina, in the lower cervical and upper thoracic region. A 4 cm meningocele is seen at the RIGHT lung apex, originating from T1-T2.  Disc levels:  C2-3: No foraminal narrowing. Facet arthropathy with bony overgrowth and joint space narrowing. No impingement.  C3-4: 2 mm anterolisthesis. Severe LEFT facet arthropathy, with marked bony overgrowth. New moderate RIGHT facet arthropathy,. LEFT-sided uncinate spurring. LEFT C4 foraminal  narrowing.  C4-5: Annular bulge. Facet arthropathy with joint space narrowing and bony overgrowth, but no foraminal narrowing.  C5-6: Disc space narrowing. Osseous spurring and uncinate hypertrophy, greater to the LEFT. BILATERAL facet arthropathy. No C6 foraminal narrowing.  C6-7: Disc space narrowing. Facet arthropathy bilaterally. No impingement.  C7-T1: 1 mm anterolisthesis. BILATERAL neural foraminal widening due to meningoceles. No impingement.  T1-T2: Unremarkable disc space. BILATERAL neural foraminal widening due to meningoceles. No impingement. RIGHT-sided meningocele is 4 cm diameter.  T2-T3: Unremarkable disc space. BILATERAL neural foraminal widening due to meningoceles. No impingement.  IMPRESSION: Facet mediated anterolisthesis at C3-4 and C7-T1 of a minor nature.  No significant spinal stenosis or cord compression at any level. Multilevel facet arthropathy is noted, along with osteopenia.  No RIGHT-sided disc protrusion is evident, although LEFT-sided foraminal narrowing is fairly pronounced at C3-4 due to bony overgrowth.  Large meningoceles at C7-T1, T1-T2, and T2-T3 as described. RIGHT-sided meningocele at T1-T2 is 4 cm diameter.  In 2014, she had a PPM implanted for CHB. Last year she was diagnosed w/Afib and started on Eliquis. She has an echocardiogram that suggests hypertrophic cardiomyopathy with asymmetric septal hypertrophy and mitral valve SAM with mild MR. She had negative cath. Followed by Dr. Zada Finders and newly with a. Fib clinic. She denies CP, palpitations, fatigue, exercise intolerance, edema.   she has a diagnosis of depression/anxiety and is currently on lexapro 20 mg daily, PRN xanax (0.5-1 mg TID), reports symptoms are well controlled on current regimen. she is currently using xanax 0.5-3/4 tab in the evening. She is trying to avoid taking daily.   BMI is Body mass index  is 27.12 kg/m., she has been working on diet, cutting  starches and sweets, exercise limited due to chronic pain.   Wt Readings from Last 3 Encounters:  02/12/20 163 lb (73.9 kg)  12/17/19 165 lb (74.8 kg)  12/03/19 165 lb 3.2 oz (74.9 kg)    Her blood pressure has been controlled at home, today their BP is BP: 124/86 She does not workout. She denies chest pain, shortness of breath, dizziness.   She is not on cholesterol medication and denies myalgias. Her cholesterol is not at goal. The cholesterol last visit was:   Lab Results  Component Value Date   CHOL 174 12/03/2019   HDL 48 (L) 12/03/2019   LDLCALC 107 (H) 12/03/2019   TRIG 94 12/03/2019   CHOLHDL 3.6 12/03/2019    She has been working on diet and exercise for prediabetes, and denies increased appetite, nausea, paresthesia of the feet, polydipsia, polyuria and visual disturbances. Last A1C in the office was:  Lab Results  Component Value Date   HGBA1C 5.8 (H) 12/03/2019   Last GFR: Lab Results  Component Value Date   GFRNONAA 60 12/03/2019   Patient is on Vitamin D supplement and near goal of 60 at recent check; she has increased up to 1000 IU daily   Lab Results  Component Value Date   VD25OH 75 12/03/2019      Medication Review:   Current Outpatient Medications (Cardiovascular):  .  hydrochlorothiazide (HYDRODIURIL) 25 MG tablet, Take 25 mg by mouth daily as needed (swelling).  .  metoprolol succinate (TOPROL-XL) 100 MG 24 hr tablet, Take 1 tablet Daily for BP & Heart  Current Outpatient Medications (Respiratory):  .  fluticasone (FLONASE) 50 MCG/ACT nasal spray, Place 2 sprays into both nostrils daily. (Patient taking differently: Place 2 sprays into both nostrils daily as needed for allergies or rhinitis. )  Current Outpatient Medications (Analgesics):  .  ibuprofen (IBU) 600 MG tablet, Take 1 tablet 3 x  /day with food for Inflammation & Pain  Current Outpatient Medications (Hematological):  Marland Kitchen  FOLIC ACID PO, Take 1 tablet by mouth daily.   Current Outpatient  Medications (Other):  Marland Kitchen  ALPRAZolam (XANAX) 1 MG tablet, Take 1/2 - 1 tablet 2 - 3 x /day ONLY if needed for Anxiety Attack &  limit to 5 days /week to avoid addiction .  Ascorbic Acid (VITAMIN C PO), Take 1 tablet by mouth daily.  .  Calcium Carbonate-Vitamin D (CALCIUM + D PO), Take 1 tablet by mouth daily.  .  Cholecalciferol (VITAMIN D PO), Take 5,000 Units by mouth 2 (two) times a day.  .  citalopram (CELEXA) 20 MG tablet, Take 1 tablet Daily for Mood .  gabapentin (NEURONTIN) 100 MG capsule, Take 1 to 2 capsules   2 to 3 x /day as needed for Headache or Pain .  gabapentin (NEURONTIN) 300 MG capsule, Take 1 capsule 3 to 4 x /day for Pain & Headache .  hydrocortisone (PROCTOZONE-HC) 2.5 % rectal cream, PLACE 1 APPLICATION RECTALLY 2 (TWO) TIMES DAILY. Marland Kitchen  MAGNESIUM PO, Take 500 mg by mouth daily.  .  Multiple Vitamin (MULTIVITAMIN) tablet, Take 1 tablet by mouth daily. .  pantoprazole (PROTONIX) 40 MG tablet, Take 1 tablet Daily with Supper for Indigestion & Acid Reflux .  Tretinoin, Facial Wrinkles, (TRETINOIN, EMOLLIENT,) 0.05 % CREA, APPY TO AFFECTED AREA EVERY DAY AS DIRECTED   Allergies  Allergen Reactions  . Levsin [Hyoscyamine Sulfate]     unknown  .  Mobic [Meloxicam]     unknown   . Topamax [Topiramate]     unknown   . Zoloft [Sertraline Hcl]     unknown   . Actonel [Risedronate Sodium] Other (See Comments)    Pt didn't like the way it made her feel   . Boniva [Ibandronic Acid] Other (See Comments)    Pt didn't like the way it made her feel  . Evista [Raloxifene] Other (See Comments)    Pt didn't like the way it made her feel  . Fosamax [Alendronate Sodium] Other (See Comments)    Pt didn't like the way it made her feel     Current Problems (verified) Patient Active Problem List   Diagnosis Date Noted  . Episodic tension-type headache, not intractable 02/12/2020  . Familial tremor 06/10/2019  . GERD (gastroesophageal reflux disease) 02/25/2019  . Major  depressive disorder, recurrent episode, mild with anxious distress ([place]) 02/25/2019  . Pacemaker 02/25/2019  . Paroxysmal atrial fibrillation ([place]) 02/25/2019  . Chronic anticoagulation 02/25/2019  . Hypertrophic obstructive cardiomyopathy ([place]) 09/21/2015  . Overweight (BMI 25.0-29.9) 09/01/2015  . Medication management 08/22/2014  . Vitamin D deficiency   . Other abnormal glucose (prediabetes)   . HTN (hypertension)   . Hyperlipidemia   . CHB (complete heart block) ([place]) 06/10/2013  . LBBB (left bundle branch block) 11/15/2012  . Osteoporosis   . DI (detrusor instability)   . Atrophic vaginitis     Screening Tests Immunization History  Administered Date(s) Administered  . Fluad Quad(high Dose 65+) 07/25/2019  . Influenza Split 08/06/2014, 09/20/2017  . Influenza, High Dose Seasonal PF 09/01/2015  . Influenza-Unspecified 09/12/2018  . Pneumococcal Conjugate-13 09/01/2015  . Pneumococcal-Unspecified 10/08/2010  . Td 05/09/2011  . Zoster 05/09/2011    Preventative care: Last colonoscopy: 11/2013 Last mammogram: 07/2019 Last pap smear/pelvic exam: 10/2013 DEXA: 06/2017 - spine T -2.7, couldn't tolerate bisphosophonates  Prior vaccinations: TD or Tdap: 2012 Influenza: 2019  Pneumococcal: 2011 Prevnar13: 2016  Shingles/Zostavax: 2012 COVID vaccines- discussed getting them patient will think about it, info given  Names of Other Physician/Practitioners you currently use: 1. [facility] here for primary care 2. Dr. Katy Fitch, eye doctor, last visit 2020, had L cataract, R mild 3. Dr. Jone Baseman, dentist, last visit 2020  Patient Care Team: Unk Pinto, MD as PCP - General (Internal Medicine) Thompson Grayer, MD as PCP - Cardiology (Cardiology) Unk Pinto, MD as Attending Physician (Internal Medicine) Thompson Grayer, MD as Consulting Physician (Cardiology) Heath Lark, MD as Consulting Physician (Hematology and Oncology) Ronald Lobo, MD as Consulting Physician (Gastroenterology)  SURGICAL HISTORY She  has a past surgical history that includes Breast surgery; Tubal ligation; Hemorrhoid surgery; Pacemaker insertion (06-11-2013); left heart catheterization with coronary angiogram (N/A, 06/11/2013); temporary pacemaker insertion (N/A, 06/11/2013); permanent pacemaker insertion (Left, 06/11/2013); permanent pacemaker insertion (N/A, 06/11/2013); Breast excisional biopsy (Right); and Cataract extraction (Left, 2019). FAMILY HISTORY Her family history includes Aneurysm in her brother; Breast cancer (age of onset: 78) in her sister; Cancer in her father; Diabetes in her brother, brother, and mother; Heart disease in her brother, brother, and mother; Hypertension in her brother; Stroke in her brother. SOCIAL HISTORY She  reports that she quit smoking about 44 years ago. Her smoking use included cigarettes. She has never used smokeless tobacco. She reports that she does not drink alcohol or use drugs.   MEDICARE WELLNESS OBJECTIVES: Physical activity: Current Exercise Habits: The patient does not participate in regular exercise at present, Exercise  limited by: orthopedic condition(s);neurologic condition(s)  No due to ortho/neuro issues Cardiac risk factors: Cardiac Risk Factors include: advanced age (>5mn, >>49women);dyslipidemia;hypertension;sedentary lifestyle Depression/mood screen:   [facility] 02/12/2020  Decreased Interest 0  Down, Depressed, Hopeless 0  PHQ - 2 Score 0  Altered sleeping 0  Tired, decreased energy 0  Change in appetite 0  Feeling bad or failure about yourself  0  Trouble concentrating 0  Moving slowly or fidgety/restless 0  Suicidal thoughts 0  PHQ-9 Score 0  Difficult doing work/chores Not difficult at all    ADLs:  In your present state of health, do you have any difficulty performing the following activities: 02/12/2020 12/07/2019  Hearing? N N  Vision? N N  Difficulty concentrating or  making decisions? N N  Walking or climbing stairs? N N  Dressing or bathing? N N  Doing errands, shopping? N N  Some recent data might be hidden     Cognitive Testing  Alert? Yes  Normal Appearance?Yes  Oriented to person? Yes  Place? Yes   Time? Yes  Recall of three objects?  2/3  Can perform simple calculations? Yes  Displays appropriate judgment?Yes  Can read the correct time from a watch face?Yes  EOL planning: Does Patient Have a Medical Advance Directive?: Yes Type of Advance Directive: Healthcare Power of Attorney, Living will Does patient want to make changes to medical advance directive?: No - Patient declined Copy of [place] Chart?: No - copy requested Yes, copy requested  Review of Systems  Constitutional: Negative for malaise/fatigue and weight loss.  HENT: Negative for hearing loss and tinnitus.   Eyes: Negative for blurred vision and double vision.  Respiratory: Negative for cough, sputum production, shortness of breath and wheezing.   Cardiovascular: Negative for chest pain, palpitations, orthopnea, claudication, leg swelling and PND.  Gastrointestinal: Negative for abdominal pain, blood in stool, constipation, diarrhea, heartburn, melena, nausea and vomiting.  Genitourinary: Negative.   Musculoskeletal: Positive for neck pain. Negative for falls, joint pain and myalgias.  Skin: Negative for rash.  Neurological: Positive for tremors and headaches. Negative for dizziness, tingling, sensory change, speech change, focal weakness, seizures, loss of consciousness and weakness.  Endo/Heme/Allergies: Negative for polydipsia.  Psychiatric/Behavioral: Negative for depression, memory loss, substance abuse and suicidal ideas. The patient is nervous/anxious and has insomnia.   All other systems reviewed and are negative.    Objective:     Today's Vitals   02/12/20 1059  BP: 124/86  Pulse: 63  Temp: 98.1 F (36.7 C)  SpO2: 96%  Weight: 163 lb  (73.9 kg)   Body mass index is 27.12 kg/m.  General appearance: alert, no distress, WD/WN,  female HEENT: normocephalic, sclerae anicteric, TMs pearly, nares patent, no discharge or erythema, pharynx normal Neck: + temporal tenderness on the right side. supple, no lymphadenopathy, no thyromegaly, no masses Heart: RRR, normal S1, S2, no murmurs Lungs: CTA bilaterally, no wheezes, rhonchi, or rales Abdomen: +bs, soft, non tender, non distended, no masses, no hepatomegaly, no splenomegaly Musculoskeletal: normal range of motion, without spinous process tenderness, with paraspinal muscle tenderness the right side, normal sensation, reflexes, and pulses distal. Extremities: no edema, no cyanosis, no clubbing Pulses: 2+ symmetric, upper and lower extremities, normal cap refill Neurological: alert, oriented x 3, CN2-12 intact, strength normal upper extremities and lower extremities, sensation normal throughout, DTRs 2+ throughout, no cerebellar signs, gait normal Psychiatric: normal affect, behavior normal, pleasant   Medicare Attestation I have personally  reviewed: The patient's medical and social history Their use of alcohol, tobacco or illicit drugs Their current medications and supplements The patient's functional ability including ADLs,fall risks, home safety risks, cognitive, and hearing and visual impairment Diet and physical activities Evidence for depression or mood disorders  The patient's weight, height, BMI, and visual acuity have been recorded in the chart.  I have made referrals, counseling, and provided education to the patient based on review of the above and I have provided the patient with a written personalized care plan for preventive services.     Vicie Mutters, PA-C   02/12/2020

## 2020-02-12 NOTE — Patient Instructions (Signed)
Going to send you to physical therapy Going to check labs If labs are negative may refer to neurosurgery or neuro at [facility]- will discuss with Dr. Oneta Rack and do some research.    Here is some information about the vaccines. The data is very good and this information hopefully answers a lot of your questions and give you a confidence boost.   The Pfizer and Moderna vaccines are messenger RNA vaccines. That technology is not new, it has been studied for 20 years at least, used for cancer and MS treatment. They had started using the vaccine for MERS AND SARS (both a different coronavirus) 10 years ago but never finished so we had a good backbone for this vaccine.   There were no short cuts with the techniques for these clinical  trials, just lots of willing participants quickly and lots of up front money helped speed up the process.   The mRNA is very fragile which is why it needs to be kept so cold and thawed a certain way, think of it as a message in a glass bottle. NO PART of the virus is in this vaccine, it is a clip of the genetic sequence. This mRNA is injected in your arm, connects with a ribosome, delivers the message and the degrades.  That is part of the cause of the sore arm, the mRNA never leaves your arm. It degrades there. The mRNA does not go into our nucleotide where our DNA is, and we would need a DNA reverse transcriptase to take RNA to DNA, we do not have this, it can not change our DNA. The ribosome that got the message creates a protein and that protein circulates in our body and we have an immune reaction to that creating antibodies. Any time our immune system is triggered, inflammation is triggered too so you can have a temp, muscle aches, etc. Normal reaction.  I have seen so many patients that just had mild COVID in the office weeks later still have issues. We are still learning about post COVID syndrome, the CDC should be coming out for guidelines for practioners soon. There is too  much unknown about COVID. We have been using vaccines for over 100 years or more, i [employer] technical [employer]. Ask your parents or any older friends about polio and that vaccine, that was a disease shutting down schools,had kids in iron lung, devastating young kids and families. People lined up for that vaccine and technology has only improved.   Please get the vaccine. If you have any further questions please make an appointment in the office to discuss further.  Marchelle Folks

## 2020-02-13 LAB — CBC WITH DIFFERENTIAL/PLATELET
Absolute Monocytes: 365 cells/uL (ref 200–950)
Basophils Absolute: 41 cells/uL (ref 0–200)
Basophils Relative: 1 %
Eosinophils Absolute: 123 cells/uL (ref 15–500)
Eosinophils Relative: 3 %
HCT: 40 % (ref 35.0–45.0)
Hemoglobin: 12.7 g/dL (ref 11.7–15.5)
Lymphs Abs: 1751 cells/uL (ref 850–3900)
MCH: 28.1 pg (ref 27.0–33.0)
MCHC: 31.8 g/dL — ABNORMAL LOW (ref 32.0–36.0)
MCV: 88.5 fL (ref 80.0–100.0)
MPV: 10.8 fL (ref 7.5–12.5)
Monocytes Relative: 8.9 %
Neutro Abs: 1820 cells/uL (ref 1500–7800)
Neutrophils Relative %: 44.4 %
Platelets: 197 10*3/uL (ref 140–400)
RBC: 4.52 10*6/uL (ref 3.80–5.10)
RDW: 12.6 % (ref 11.0–15.0)
Total Lymphocyte: 42.7 %
WBC: 4.1 10*3/uL (ref 3.8–10.8)

## 2020-02-13 LAB — COMPLETE METABOLIC PANEL WITH GFR
AG Ratio: 1.6 (calc) (ref 1.0–2.5)
ALT: 11 U/L (ref 6–29)
AST: 14 U/L (ref 10–35)
Albumin: 3.9 g/dL (ref 3.6–5.1)
Alkaline phosphatase (APISO): 86 U/L (ref 37–153)
BUN: 14 mg/dL (ref 7–25)
CO2: 29 mmol/L (ref 20–32)
Calcium: 9.3 mg/dL (ref 8.6–10.4)
Chloride: 107 mmol/L (ref 98–110)
Creat: 0.76 mg/dL (ref 0.60–0.93)
GFR, Est African American: 86 mL/min/{1.73_m2} (ref >=60)
GFR, Est Non African American: 75 mL/min/{1.73_m2} (ref >=60)
Globulin: 2.4 g/dL (calc) (ref 1.9–3.7)
Glucose, Bld: 92 mg/dL (ref 65–99)
Potassium: 4.8 mmol/L (ref 3.5–5.3)
Sodium: 141 mmol/L (ref 135–146)
Total Bilirubin: 0.4 mg/dL (ref 0.2–1.2)
Total Protein: 6.3 g/dL (ref 6.1–8.1)

## 2020-02-13 LAB — FERRITIN: Ferritin: 29 ng/mL (ref 16–288)

## 2020-02-13 LAB — IRON, TOTAL/TOTAL IRON BINDING CAP
%SAT: 30 % (calc) (ref 16–45)
Iron: 96 ug/dL (ref 45–160)
TIBC: 317 mcg/dL (calc) (ref 250–450)

## 2020-02-13 LAB — C-REACTIVE PROTEIN: CRP: 0.4 mg/L (ref <8.0)

## 2020-02-13 LAB — SEDIMENTATION RATE: Sed Rate: 2 mm/h (ref 0–30)

## 2020-02-13 LAB — RHEUMATOID FACTOR: Rheumatoid fact SerPl-aCnc: <14 IU/mL (ref <14)

## 2020-02-13 LAB — VITAMIN B12: Vitamin B-12: 821 pg/mL (ref 200–1100)

## 2020-02-13 NOTE — Addendum Note (Signed)
Addended by: Quentin Mulling R on: 02/13/2020 10:46 AM   Modules accepted: Orders

## 2020-03-02 ENCOUNTER — Other Ambulatory Visit: Payer: Self-pay

## 2020-03-02 ENCOUNTER — Encounter: Payer: Self-pay | Admitting: Physical Therapy

## 2020-03-02 ENCOUNTER — Ambulatory Visit: Payer: Medicare PPO | Attending: Physician Assistant | Admitting: Physical Therapy

## 2020-03-02 DIAGNOSIS — M542 Cervicalgia: Secondary | ICD-10-CM | POA: Insufficient documentation

## 2020-03-02 DIAGNOSIS — R293 Abnormal posture: Secondary | ICD-10-CM

## 2020-03-02 DIAGNOSIS — M62838 Other muscle spasm: Secondary | ICD-10-CM | POA: Insufficient documentation

## 2020-03-02 NOTE — Therapy (Signed)
[facility] [address] [place], [place], [address] Phone: [phone]   Fax:  [phone]  Physical Therapy Evaluation  Patient Details  Name: Alexandra Wheeler MRN: [id number] Date of Birth: 09/10/1941 Referring Provider (PT): Quentin Mulling, [place]   Encounter Date: 03/02/2020  PT End of Session - 03/02/20 1242    Visit Number  1    Number of Visits  9    Date for PT Re-Evaluation  04/27/20    Authorization Type  [employer] MCR: kx mod at 15th visit    Progress Note Due on Visit  10    PT Start Time  1146    PT Stop Time  1228    PT Time Calculation (min)  42 min    Activity Tolerance  Patient tolerated treatment well    Behavior During Therapy  [facility] for tasks assessed/performed       Past Medical History:  Diagnosis Date  . Anxiety   . Atrophic vaginitis   . Chest pain    a. Adenosine sestamibi (1/14) with no evidence of ischemia or infarction, EF 63%.   . Colon polyps   . Complete heart block (HCC) 06-2013   status post pacemaker implantation by Dr Johney Frame 06-11-2013  . Depression   . DI (detrusor instability)   . GERD (gastroesophageal reflux disease)    History + Hyplori via EGD  . H/O echocardiogram    a. Echo 11/2012:EF 50-55%, focal basal septal hypertrophy with some mitral valve SAM (mild) and MR.  This may be a sigmoid septum with advanced age or could be a variant of hypertrophic cardiomyopathy.  Marland Kitchen HTN (hypertension)   . Hyperlipidemia   . LBBB (left bundle branch block)   . Leukopenia 05/19/2014  . Lymphocytosis 05/19/2014  . Migraines   . Osteoporosis   . Paroxysmal atrial fibrillation (HCC)   . Prediabetes   . Vitamin D deficiency     Past Surgical History:  Procedure Laterality Date  . BREAST EXCISIONAL BIOPSY Right   . BREAST SURGERY     Breast cyst  . CATARACT EXTRACTION Left 2019   Dr. Dione Booze  . HEMORRHOID SURGERY    . LEFT HEART CATHETERIZATION WITH CORONARY ANGIOGRAM N/A 06/11/2013    Procedure: LEFT HEART CATHETERIZATION WITH CORONARY ANGIOGRAM;  Surgeon: Kathleene Hazel, MD;  Location: [facility] CATH LAB;  Service: Cardiovascular;  Laterality: N/A;  . PACEMAKER INSERTION  06-11-2013   STJ dual chamber pacemaker implanted by Dr Johney Frame for complete heart block  . PERMANENT PACEMAKER INSERTION Left 06/11/2013   Procedure: PERMANENT PACEMAKER INSERTION;  Surgeon: Kathleene Hazel, MD;  Location: [facility] CATH LAB;  Service: Cardiovascular;  Laterality: Left;  . PERMANENT PACEMAKER INSERTION N/A 06/11/2013   Procedure: PERMANENT PACEMAKER INSERTION;  Surgeon: Hillis Range, MD;  Location: [facility] CATH LAB;  Service: Cardiovascular;  Laterality: N/A;  . TEMPORARY PACEMAKER INSERTION N/A 06/11/2013   Procedure: TEMPORARY PACEMAKER INSERTION;  Surgeon: Kathleene Hazel, MD;  Location: [facility] CATH LAB;  Service: Cardiovascular;  Laterality: N/A;  . TUBAL LIGATION      There were no vitals filed for this visit.   Subjective Assessment - 03/02/20 1151    Subjective  pt is a 79 y.o F with CC of R sided neck pain and pain inthe back of the head that started over a few years ago with no specific precursor event. she reports the pain in the head and is always there and progressively worsening. she denies any referral of pain,  it stays mostly in the back of the head. she reports no hx or head/ neck problems before this episode.    Pertinent History  hx of a pacemaker    How long can you sit comfortably?  unlimited    How long can you stand comfortably?  unlimited    How long can you walk comfortably?  unlimited    Diagnostic tests  CT scan on 04/08/2019 Facet mediated anterolisthesis at C3-4 and C7-T1 of a minor nature. No significant spinal stenosis or cord compression at any level.Multilevel facet arthropathy is noted, along with osteopenia. No RIGHT-sided disc protrusion is evident, although LEFT-sidedforaminal narrowing is fairly pronounced at C3-4 due to bonyovergrowth. Large meningoceles at C7-T1,  T1-T2, and T2-T3 as described.RIGHT-sided meningocele at T1-T2 is 4 cm diameter.    Patient Stated Goals  to decrease pain and make it go away.    Currently in Pain?  Yes    Pain Score  5    took tylenol/ ibuprofen this AM   Pain Location  Neck    Pain Orientation  Right;Posterior    Pain Descriptors / Indicators  Aching;Constant;Sharp    Pain Type  Chronic pain    Pain Onset  More than a month ago    Pain Frequency  Constant    Aggravating Factors   NA    Pain Relieving Factors  tylenol/ ibuprofen, heating pad,    Effect of Pain on Daily Activities  pain throughout the day.         [facility] PT Assessment - 03/02/20 1142      Assessment   Medical Diagnosis  Pseudomeningocele of spinal cord G96.198, Chronic right-sided headache R51.9, G89.29    Referring Provider (PT)  Quentin Mulling, PA-C    Onset Date/Surgical Date  --   a few years ago   Hand Dominance  Right    Next MD Visit  03/05/2020    Prior Therapy  yes   for her low back at a different clinic     Precautions   Precautions  ICD/Pacemaker      Restrictions   Weight Bearing Restrictions  No      Balance Screen   Has the patient fallen in the past 6 months  No    Has the patient had a decrease in activity level because of a fear of falling?   No      Home Environment   Living Environment  Private residence    Living Arrangements  Children    Available Help at Discharge  Family    Type of Home  House    Home Access  Stairs to enter    Entrance Stairs-Number of Steps  3    Entrance Stairs-Rails  Can reach both    Home Layout  Two level    Alternate Level Stairs-Number of Steps  15    Alternate Level Stairs-Rails  Can reach both      Prior Function   Level of Independence  Independent    Vocation  Retired      IT consultant   Overall Cognitive Status  Within Functional Limits for tasks assessed      Observation/Other Assessments   Focus on Therapeutic Outcomes (FOTO)   49% limited   predicted 42% limited      Posture/Postural Control   Posture/Postural Control  Postural limitations    Postural Limitations  Rounded Shoulders;Forward head      ROM / Strength   AROM / PROM / Strength  AROM;PROM;Strength      AROM   Overall AROM Comments  shoulder ROM WFL    AROM Assessment Site  Cervical;Shoulder    Cervical Flexion  62    Cervical Extension  30    Cervical - Right Side Bend  30    Cervical - Left Side Bend  32   end range stiffness on contralater side   Cervical - Right Rotation  50    Cervical - Left Rotation  50      Strength   Strength Assessment Site  Shoulder    Right/Left Shoulder  Right;Left      Palpation   Palpation comment  TTP along the                 Objective measurements completed on examination: See above findings.      [facility] PT Treatment/Exercise - 03/02/20 1142      Exercises   Exercises  Neck      Neck Exercises: Seated   Other Seated Exercise  scapular retraction 1 x 10 holding 5 seconds      Neck Exercises: Supine   Neck Retraction  10 reps;10 secs      Manual Therapy   Manual therapy comments  sub-occipital release, and MTPR along R upper trap/ levator scapuale      Neck Exercises: Stretches   Upper Trapezius Stretch  2 reps;30 seconds;Right    Levator Stretch  Right;2 reps;30 seconds             PT Education - 03/02/20 1202    Education Details  evaluation findings, POC, goals, HEP with proper form/ rationale.    Person(s) Educated  Patient    Methods  Explanation;Verbal cues;Handout    Comprehension  Verbalized understanding;Verbal cues required       PT Short Term Goals - 03/02/20 1254      PT SHORT TERM GOAL #1   Title  pt to be I with inital HEP    Time  4    Period  Weeks    Status  New    Target Date  03/30/20        PT Long Term Goals - 03/02/20 1254      PT LONG TERM GOAL #1   Title  pt to verbalize/ demo efficient posture and lifting mechancis to reduce muscle tension/ neck pain    Time  8     Period  Weeks    Status  New    Target Date  04/27/20      PT LONG TERM GOAL #2   Title  pt to maintain cervical mobility reporting </= 2/10 pain and maximize safety with driving for QOL    Time  6    Period  Weeks    Status  New    Target Date  04/27/20      PT LONG TERM GOAL #3   Title  increase FOTO score to </= 42% limited to demo improvement in function    Time  8    Period  Weeks    Status  New    Target Date  04/27/20      PT LONG TERM GOAL #4   Title  pt to be I with all HEP given to maintain and progress current level of function    Time  8    Period  Weeks    Status  New    Target Date  04/27/20  Plan - 03/02/20 1244    Clinical Impression Statement  pt presents to OPPt with CC of chronic head/ neck pain starting over a few years ago with no specific MOI. She demsontrates functional cervical ROM noting contralateral end range stiffness with L rotation/ sidebending. TTP along R upper trap/ levator scapuale with mulitple trigger points noted and along R sub-occipitals. She resonded well to sub-occipital release and manual trigger point release along the Rupper trap/ levator scapulae noting no pain following. she would benefit from physical therapy to decrease R sided neck pain, reduce spasm, and returnt to PLOF by addressing the deficits listed.    Personal Factors and Comorbidities  Comorbidity 2    Comorbidities  hx of psuedomeninigocele, pacemaker.    Stability/Clinical Decision Making  Evolving/Moderate complexity    Clinical Decision Making  Moderate    Rehab Potential  Good    PT Frequency  1x / week    PT Duration  8 weeks    PT Treatment/Interventions  ADLs/Self Care Home Management;Cryotherapy;Moist Heat;Therapeutic exercise;Therapeutic activities;Neuromuscular re-education;Passive range of motion;Taping;Patient/family education    PT Next Visit Plan  (no E-STIM for [place] hx of pacemaker) review/ update HEP PRN, NO dry needling due to  psuedomeningocele, focus on MTRP and teach how to perofrm sub-occipital release, cervical isometrics, stretching, posture handout    PT Home Exercise Plan  [id number] - Chin tuck, upper trap/ levator scapuale stretch, scap retraction    Consulted and Agree with Plan of Care  Patient       Patient will benefit from skilled therapeutic intervention in order to improve the following deficits and impairments:  Pain, Improper body mechanics, Decreased strength  Visit Diagnosis: Cervicalgia  Abnormal posture  Other muscle spasm     Problem List Patient Active Problem List   Diagnosis Date Noted  . Episodic tension-type headache, not intractable 02/12/2020  . Familial tremor 06/10/2019  . GERD (gastroesophageal reflux disease) 02/25/2019  . Major depressive disorder, recurrent episode, mild with anxious distress (HCC) 02/25/2019  . Pacemaker 02/25/2019  . Paroxysmal atrial fibrillation (HCC) 02/25/2019  . Chronic anticoagulation 02/25/2019  . Hypertrophic obstructive cardiomyopathy (HCC) 09/21/2015  . Overweight (BMI 25.0-29.9) 09/01/2015  . Medication management 08/22/2014  . Vitamin D deficiency   . Other abnormal glucose (prediabetes)   . HTN (hypertension)   . Hyperlipidemia   . CHB (complete heart block) (HCC) 06/10/2013  . LBBB (left bundle branch block) 11/15/2012  . Osteoporosis   . DI (detrusor instability)   . Atrophic vaginitis    Lulu Riding PT, DPT, LAT, ATC  03/02/20  12:59 PM      [facility] [address] [place], [place], [address] Phone: [phone]   Fax:  [phone]  Name: Trachelle Low MRN: [id number] Date of Birth: 04-02-1941

## 2020-03-04 DIAGNOSIS — Q055 Cervical spina bifida without hydrocephalus: Secondary | ICD-10-CM | POA: Insufficient documentation

## 2020-03-04 NOTE — Progress Notes (Deleted)
3 MONTH FOLLOW UP  Assessment:    LBBB (left bundle branch block) Followed by cardiology; s/p pacemaker  Hypertrophic obstructive cardiomyopathy ([place]) Weights stable; followed by cardiology; manage BP  Essential hypertension Continue medications Monitor blood pressure at home; call if consistently over 130/80 Continue DASH diet.   Reminder to go to the ER if any CP, SOB, nausea, dizziness, severe HA, changes vision/speech, left arm numbness and tingling and jaw pain.  CHB (complete heart block) (HCC) S/p pacemaker; followed by cardiology  Major depressive disorder, recurrent episode, mild with anxious distress (Alexandra Wheeler) Continue medications; fairly controlled; reminded to avoid daily benzo use; she is working on cutting down  Lifestyle discussed: diet/exerise, sleep hygiene, stress management, hydration  Paroxysmal atrial fibrillation (HCC) No concerns with excessive bleeding, no falls Continue medication: elequis Followed by cardiology  Vitamin D deficiency Below goal last visit; she did increase dose continue to recommend supplementation for goal of 60-100 Check vitamin D level annually or as needed  Other abnormal glucose Discussed disease and risks Discussed diet/exercise, weight management  A1C, insulin level routinely   Medication management CBC, CMP at 31mV  Mixed hyperlipidemia Mild elevations not on meds; working on lifestyle Continue low cholesterol diet and exercise.  Check lipid panel.   Gastroesophageal reflux disease, esophagitis presence not specified Well managed by lifestyle at this time Discussed diet, avoiding triggers and other lifestyle changes  Pacemaker Followed by Cardiology  Chronic anticoagulation No signs of excessive bleeding; reminded to present to ED for any trauma or head injuries  Episodic tension-type headache, not intractable *** ? From cervical Pseudomeningocele - will request Dr. EClarice Polenotes Normal neuro- had CT head  normal 2016 Suggest PT for headache- can try dry needling   Cerv[place] spine [place]ingo[place] ([place] Dr. WMina Marble Dr. EEllene Route was referred to PT and [facility] for second opinion per her request ***   Over 40 minutes of exam, counseling, chart review and critical decision making was performed Future Appointments  Date Time Provider [place] 03/05/2020 11:15 AM CLiane Comber NP [facility] None  03/18/2020  2:15 PM LLarey Days [facility] 03/25/2020 11:00 AM [facility] Days [facility] 04/01/2020 11:00 AM LLarey Days PT [facility] 04/08/2020  8:10 AM [facility] DEVICE REMOTES [facility]  06/08/2020 10:30 AM MUnk Pinto MD [facility] None  07/08/2020  8:10 AM [facility] DEVICE REMOTES [facility]  10/07/2020  8:10 AM [facility] DEVICE REMOTES [facility]  12/16/2020  9:00 AM MUnk Pinto MD [facility] None      Subjective:  ALatorria Zeoliis a 79y.o. female who presents for 3 month follow up.   She has a history of following with Dr. WMina Marblefor chronic headaches and has seen Dr. EEllene Routefor the pain being possibly related to cervical arthritis. She had a cervical myelogram in June that showed several cervical meningicele (Large meningoceles at C7-T1, T1-T2, and T2-T3, RIGHT-sided meningocele at T1-T2 is 4 cm diameter).  ESR/CRP were checked at last visit 02/12/2020 due to new R sided headache/ear pain x 1 week and were normal at last visit 02/12/2020.  *** she was referred to PT. [facility]    she has a diagnosis of depression/anxiety and is currently on lexapro 20 mg daily, PRN xanax (0.5-1 mg TID), reports symptoms are well controlled on current regimen. she is currently using xanax 0.5-3/4 tab in the evening. She is trying to avoid taking daily.  BMI is There is no height or weight on file to calculate BMI., she has been working on diet, cutting starches  and sweets, exercise limited due to chronic pain.   Wt Readings from Last 3 Encounters:  02/12/20 163 lb (73.9 kg)  12/17/19 165 lb (74.8 kg)  12/03/19 165 lb 3.2 oz (74.9 kg)   In 2014, she had a PPM implanted for CHB. 10/2018 she was diagnosed w/Afib and started on Eliquis. She has an echocardiogram that suggests hypertrophic cardiomyopathy with asymmetric septal hypertrophy and mitral valve SAM with mild MR. She had negative cath. Followed by Dr. Zada Finders and with the a. Fib clinic. She denies CP, palpitations, fatigue, exercise intolerance, edema.    Her blood pressure has been controlled at home, today their BP is   She does not workout. She denies chest pain, shortness of breath, dizziness.   She is not on cholesterol medication and denies myalgias. Her cholesterol is not at goal. The cholesterol last visit was:   Lab Results  Component Value Date   CHOL 174 12/03/2019   HDL 48 (L) 12/03/2019   LDLCALC 107 (H) 12/03/2019   TRIG 94 12/03/2019   CHOLHDL 3.6 12/03/2019    She has been working on diet and exercise for prediabetes, and denies increased appetite, nausea, paresthesia of the feet, polydipsia, polyuria and visual disturbances. Last A1C in the office was:  Lab Results  Component Value Date   HGBA1C 5.8 (H) 12/03/2019   Last GFR: Lab Results  Component Value Date   GFRNONAA 75 02/12/2020   Patient is on Vitamin D supplement and at goal taking 1000 IU daily   Lab Results  Component Value Date   VD25OH 75 12/03/2019      Medication Review:   Current Outpatient Medications (Cardiovascular):  .  hydrochlorothiazide (HYDRODIURIL) 25 MG tablet, Take 25 mg by mouth daily as needed (swelling).  .  metoprolol succinate (TOPROL-XL) 100 MG 24 hr tablet, Take 1 tablet Daily for BP & Heart  Current Outpatient Medications (Respiratory):  .  fluticasone (FLONASE) 50 MCG/ACT nasal spray, Place 2 sprays into both nostrils daily. (Patient taking differently: Place 2 sprays  into both nostrils daily as needed for allergies or rhinitis. )  Current Outpatient Medications (Analgesics):  .  ibuprofen (IBU) 600 MG tablet, Take 1 tablet 3 x  /day with food for Inflammation & Pain  Current Outpatient Medications (Hematological):  Marland Kitchen  FOLIC ACID PO, Take 1 tablet by mouth daily.   Current Outpatient Medications (Other):  Marland Kitchen  ALPRAZolam (XANAX) 1 MG tablet, Take 1/2 - 1 tablet 2 - 3 x /day ONLY if needed for Anxiety Attack &  limit to 5 days /week to avoid addiction .  Ascorbic Acid (VITAMIN C PO), Take 1 tablet by mouth daily.  .  Calcium Carbonate-Vitamin D (CALCIUM + D PO), Take 1 tablet by mouth daily.  .  Cholecalciferol (VITAMIN D PO), Take 5,000 Units by mouth 2 (two) times a day.  .  citalopram (CELEXA) 20 MG tablet, Take 1 tablet Daily for Mood .  gabapentin (NEURONTIN) 100 MG capsule, Take 1 to 2 capsules   2 to 3 x /day as needed for Headache or Pain .  gabapentin (NEURONTIN) 300 MG capsule, Take 1 capsule 3 to 4 x /day for Pain & Headache .  hydrocortisone (PROCTOZONE-HC) 2.5 % rectal cream, PLACE 1 APPLICATION RECTALLY 2 (TWO) TIMES DAILY. Marland Kitchen  MAGNESIUM PO, Take 500 mg by mouth daily.  .  Multiple Vitamin (  MULTIVITAMIN) tablet, Take 1 tablet by mouth daily. .  pantoprazole (PROTONIX) 40 MG tablet, Take 1 tablet Daily with Supper for Indigestion & Acid Reflux .  Tretinoin, Facial Wrinkles, (TRETINOIN, EMOLLIENT,) 0.05 % CREA, APPY TO AFFECTED AREA EVERY DAY AS DIRECTED   Allergies  Allergen Reactions  . Levsin [Hyoscyamine Sulfate]     unknown  . Mobic [Meloxicam]     unknown   . Topamax [Topiramate]     unknown   . Zoloft [Sertraline Hcl]     unknown   . Actonel [Risedronate Sodium] Other (See Comments)    Pt didn't like the way it made her feel   . Boniva [Ibandronic Acid] Other (See Comments)    Pt didn't like the way it made her feel  . Evista [Raloxifene] Other (See Comments)    Pt didn't like the way it made her feel  . Fosamax  [Alendronate Sodium] Other (See Comments)    Pt didn't like the way it made her feel     Current Problems (verified) Patient Active Problem List   Diagnosis Date Noted  . Episodic tension-type headache, not intractable 02/12/2020  . Familial tremor 06/10/2019  . GERD (gastroesophageal reflux disease) 02/25/2019  . Major depressive disorder, recurrent episode, mild with anxious distress ([place]) 02/25/2019  . Pacemaker 02/25/2019  . Paroxysmal atrial fibrillation ([place]) 02/25/2019  . Chronic anticoagulation 02/25/2019  . Hypertrophic obstructive cardiomyopathy ([place]) 09/21/2015  . Overweight (BMI 25.0-29.9) 09/01/2015  . Medication management 08/22/2014  . Vitamin D deficiency   . Other abnormal glucose (prediabetes)   . HTN (hypertension)   . Hyperlipidemia   . CHB (complete heart block) ([place]) 06/10/2013  . LBBB (left bundle branch block) 11/15/2012  . Osteoporosis   . DI (detrusor instability)   . Atrophic vaginitis     SURGICAL HISTORY She  has a past surgical history that includes Breast surgery; Tubal ligation; Hemorrhoid surgery; Pacemaker insertion (06-11-2013); left heart catheterization with coronary angiogram (N/A, 06/11/2013); temporary pacemaker insertion (N/A, 06/11/2013); permanent pacemaker insertion (Left, 06/11/2013); permanent pacemaker insertion (N/A, 06/11/2013); Breast excisional biopsy (Right); and Cataract extraction (Left, 2019). FAMILY HISTORY Her family history includes Aneurysm in her brother; Breast cancer (age of onset: 33) in her sister; Cancer in her father; Diabetes in her brother, brother, and mother; Heart disease in her brother, brother, and mother; Hypertension in her brother; Stroke in her brother. SOCIAL HISTORY She  reports that she quit smoking about 44 years ago. Her smoking use included cigarettes. She has never used smokeless tobacco. She reports that she does not drink alcohol or use drugs.   Review of Systems  Constitutional: Negative for  malaise/fatigue and weight loss.  HENT: Negative for hearing loss and tinnitus.   Eyes: Negative for blurred vision and double vision.  Respiratory: Negative for cough, sputum production, shortness of breath and wheezing.   Cardiovascular: Negative for chest pain, palpitations, orthopnea, claudication, leg swelling and PND.  Gastrointestinal: Negative for abdominal pain, blood in stool, constipation, diarrhea, heartburn, melena, nausea and vomiting.  Genitourinary: Negative.   Musculoskeletal: Positive for neck pain. Negative for falls, joint pain and myalgias.  Skin: Negative for rash.  Neurological: Positive for tremors and headaches. Negative for dizziness, tingling, sensory change, speech change, focal weakness, seizures, loss of consciousness and weakness.  Endo/Heme/Allergies: Negative for polydipsia.  Psychiatric/Behavioral: Negative for depression, memory loss, substance abuse and suicidal ideas. The patient is nervous/anxious and has insomnia.   All other systems reviewed and are negative.    Objective:  There were no vitals filed for this visit. There is no height or weight on file to calculate BMI.  General appearance: alert, no distress, WD/WN,  female HEENT: normocephalic, sclerae anicteric, TMs pearly, nares patent, no discharge or erythema, pharynx normal Neck: + temporal tenderness on the right side. supple, no lymphadenopathy, no thyromegaly, no masses Heart: RRR, normal S1, S2, no murmurs Lungs: CTA bilaterally, no wheezes, rhonchi, or rales Abdomen: +bs, soft, non tender, non distended, no masses, no hepatomegaly, no splenomegaly Musculoskeletal: normal range of motion, without spinous process tenderness, with paraspinal muscle tenderness the right side, normal sensation, reflexes, and pulses distal. Extremities: no edema, no cyanosis, no clubbing Pulses: 2+ symmetric, upper and lower extremities, normal cap refill Neurological: alert, oriented x 3, CN2-12 intact,  strength normal upper extremities and lower extremities, sensation normal throughout, DTRs 2+ throughout, no cerebellar signs, gait normal Psychiatric: normal affect, behavior normal, pleasant    Alexandra Ribas, NP   03/04/2020

## 2020-03-05 ENCOUNTER — Ambulatory Visit: Payer: Medicare PPO | Admitting: Adult Health

## 2020-03-18 ENCOUNTER — Encounter: Payer: Self-pay | Admitting: Physical Therapy

## 2020-03-18 ENCOUNTER — Ambulatory Visit: Payer: Medicare PPO | Attending: Physician Assistant | Admitting: Physical Therapy

## 2020-03-18 ENCOUNTER — Other Ambulatory Visit: Payer: Self-pay

## 2020-03-18 DIAGNOSIS — M62838 Other muscle spasm: Secondary | ICD-10-CM | POA: Diagnosis not present

## 2020-03-18 DIAGNOSIS — M542 Cervicalgia: Secondary | ICD-10-CM

## 2020-03-18 DIAGNOSIS — R293 Abnormal posture: Secondary | ICD-10-CM | POA: Insufficient documentation

## 2020-03-18 NOTE — Therapy (Signed)
[facility] [address] [place], [place], [address] Phone: [phone]   Fax:  [phone]  Physical Therapy Treatment  Patient Details  Name: Alexandra Wheeler MRN: [id number] Date of Birth: Jul 29, 1941 Referring Provider (PT): Quentin Mulling, [place]   Encounter Date: 03/18/2020  PT End of Session - 03/18/20 1419    Visit Number  2    Number of Visits  9    Date for PT Re-Evaluation  04/27/20    Authorization Type  [employer] MCR: kx mod at 15th visit    Progress Note Due on Visit  10    PT Start Time  1417    PT Stop Time  1458    PT Time Calculation (min)  41 min    Activity Tolerance  Patient tolerated treatment well    Behavior During Therapy  [facility] for tasks assessed/performed       Past Medical History:  Diagnosis Date  . Anxiety   . Atrophic vaginitis   . Chest pain    a. Adenosine sestamibi (1/14) with no evidence of ischemia or infarction, EF 63%.   . Colon polyps   . Complete heart block (HCC) 06-2013   status post pacemaker implantation by Dr Johney Frame 06-11-2013  . Depression   . DI (detrusor instability)   . GERD (gastroesophageal reflux disease)    History + Hyplori via EGD  . H/O echocardiogram    a. Echo 11/2012:EF 50-55%, focal basal septal hypertrophy with some mitral valve SAM (mild) and MR.  This may be a sigmoid septum with advanced age or could be a variant of hypertrophic cardiomyopathy.  Marland Kitchen HTN (hypertension)   . Hyperlipidemia   . LBBB (left bundle branch block)   . Leukopenia 05/19/2014  . Lymphocytosis 05/19/2014  . Migraines   . Osteoporosis   . Paroxysmal atrial fibrillation (HCC)   . Prediabetes   . Vitamin D deficiency     Past Surgical History:  Procedure Laterality Date  . BREAST EXCISIONAL BIOPSY Right   . BREAST SURGERY     Breast cyst  . CATARACT EXTRACTION Left 2019   Dr. Dione Booze  . HEMORRHOID SURGERY    . LEFT HEART CATHETERIZATION WITH CORONARY ANGIOGRAM N/A 06/11/2013   Procedure: LEFT HEART CATHETERIZATION WITH CORONARY ANGIOGRAM;  Surgeon: Kathleene Hazel, MD;  Location: [facility] CATH LAB;  Service: Cardiovascular;  Laterality: N/A;  . PACEMAKER INSERTION  06-11-2013   STJ dual chamber pacemaker implanted by Dr Johney Frame for complete heart block  . PERMANENT PACEMAKER INSERTION Left 06/11/2013   Procedure: PERMANENT PACEMAKER INSERTION;  Surgeon: Kathleene Hazel, MD;  Location: [facility] CATH LAB;  Service: Cardiovascular;  Laterality: Left;  . PERMANENT PACEMAKER INSERTION N/A 06/11/2013   Procedure: PERMANENT PACEMAKER INSERTION;  Surgeon: Hillis Range, MD;  Location: [facility] CATH LAB;  Service: Cardiovascular;  Laterality: N/A;  . TEMPORARY PACEMAKER INSERTION N/A 06/11/2013   Procedure: TEMPORARY PACEMAKER INSERTION;  Surgeon: Kathleene Hazel, MD;  Location: [facility] CATH LAB;  Service: Cardiovascular;  Laterality: N/A;  . TUBAL LIGATION      There were no vitals filed for this visit.  Subjective Assessment - 03/18/20 1420    Subjective  " I got up this morning with continued pain in the neck. The pain is bearable but its there. I have some issue with doing the exercises "    Diagnostic tests  CT scan on 04/08/2019 Facet mediated anterolisthesis at C3-4 and C7-T1 of a minor nature. No significant spinal stenosis or  cord compression at any level.Multilevel facet arthropathy is noted, along with osteopenia. No RIGHT-sided disc protrusion is evident, although LEFT-sidedforaminal narrowing is fairly pronounced at C3-4 due to bonyovergrowth. Large meningoceles at C7-T1, T1-T2, and T2-T3 as described.RIGHT-sided meningocele at T1-T2 is 4 cm diameter.    Currently in Pain?  Yes    Pain Score  6     Pain Orientation  Right    Pain Descriptors / Indicators  Aching    Pain Type  Chronic pain    Pain Onset  More than a month ago    Pain Frequency  Constant                        [facility] Adult PT Treatment/Exercise - 03/18/20 0001      Self-Care   Self-Care   Other Self-Care Comments    Other Self-Care Comments   how to perform trigger point release independently and tools that can be used to assist.       Neck Exercises: Supine   Neck Retraction  10 reps;10 secs   tactile cues for proper form     Manual Therapy   Manual Therapy  Manual Traction;Joint mobilization    Manual therapy comments  sub-occipital release, and MTPR along R upper trap/ levator scapuale    Joint Mobilization  C3-C7 PA grade III, R rib inferior mobs grade III    Manual Traction  cervical traction x 5 min      Neck Exercises: Stretches   Upper Trapezius Stretch  2 reps;Right;30 seconds    Levator Stretch  Right;2 reps;30 seconds             PT Education - 03/18/20 1458    Education Details  reviewed HEP and how to perform self trigger point release.    Person(s) Educated  Patient    Methods  Explanation;Verbal cues;Handout    Comprehension  Verbalized understanding;Verbal cues required       PT Short Term Goals - 03/02/20 1254      PT SHORT TERM GOAL #1   Title  pt to be I with inital HEP    Time  4    Period  Weeks    Status  New    Target Date  03/30/20        PT Long Term Goals - 03/02/20 1254      PT LONG TERM GOAL #1   Title  pt to verbalize/ demo efficient posture and lifting mechancis to reduce muscle tension/ neck pain    Time  8    Period  Weeks    Status  New    Target Date  04/27/20      PT LONG TERM GOAL #2   Title  pt to maintain cervical mobility reporting </= 2/10 pain and maximize safety with driving for QOL    Time  6    Period  Weeks    Status  New    Target Date  04/27/20      PT LONG TERM GOAL #3   Title  increase FOTO score to </= 42% limited to demo improvement in function    Time  8    Period  Weeks    Status  New    Target Date  04/27/20      PT LONG TERM GOAL #4   Title  pt to be I with all HEP given to maintain and progress current level of function  Time  8    Period  Weeks    Status  New    Target  Date  04/27/20            Plan - 03/18/20 1554    Clinical Impression Statement  pt reported relief of pain following the last session but reported pain started back that evening. Continued working on trigger point release along the R upper trap/ levator scapulae and sub-occipital release. Reviewed HEP and discussed how to perform ant home and consistency which she noted relief end of session with no pain.    Comorbidities  hx of psuedomeninigocele, pacemaker.    PT Treatment/Interventions  ADLs/Self Care Home Management;Cryotherapy;Moist Heat;Therapeutic exercise;Therapeutic activities;Neuromuscular re-education;Passive range of motion;Taping;Patient/family education    PT Next Visit Plan  (no E-STIM for [place] hx of pacemaker) review/ update HEP PRN, NO dry needling due to psuedomeningocele, focus on MTRP and teach how to perofrm sub-occipital release, cervical isometrics, stretching, posture handout    PT Home Exercise Plan  [id number] - Chin tuck, upper trap/ levator scapuale stretch, scap retraction    Consulted and Agree with Plan of Care  Patient       Patient will benefit from skilled therapeutic intervention in order to improve the following deficits and impairments:  Pain, Improper body mechanics, Decreased strength  Visit Diagnosis: Cervicalgia  Abnormal posture  Other muscle spasm     Problem List Patient Active Problem List   Diagnosis Date Noted  . 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 Episodic tension-type headache, not intractable 02/12/2020  . Familial tremor 06/10/2019  . GERD (gastroesophageal reflux disease) 02/25/2019  . Major depressive disorder, recurrent episode, mild with anxious distress ([place]) 02/25/2019  . Pacemaker 02/25/2019  . Paroxysmal atrial fibrillation ([place]) 02/25/2019  . Chronic anticoagulation 02/25/2019  . Hypertrophic obstructive cardiomyopathy ([place]) 09/21/2015  . Overweight (BMI 25.0-29.9) 09/01/2015  . Medication management  08/22/2014  . Vitamin D deficiency   . Other abnormal glucose (prediabetes)   . HTN (hypertension)   . Hyperlipidemia   . CHB (complete heart block) ([place]) 06/10/2013  . LBBB (left bundle branch block) 11/15/2012  . Osteoporosis   . DI (detrusor instability)   . Atrophic vaginitis     Starr Lake PT, DPT, LAT, ATC  03/18/20  3:57 PM      [facility] [address] [place], [place], [address] Phone: [phone]   Fax:  [phone]  Name: Alexandra Wheeler MRN: [id number] Date of Birth: 07-18-1941

## 2020-03-25 ENCOUNTER — Other Ambulatory Visit: Payer: Self-pay

## 2020-03-25 ENCOUNTER — Ambulatory Visit: Payer: Medicare PPO | Admitting: Physical Therapy

## 2020-03-25 DIAGNOSIS — M62838 Other muscle spasm: Secondary | ICD-10-CM | POA: Diagnosis not present

## 2020-03-25 DIAGNOSIS — R293 Abnormal posture: Secondary | ICD-10-CM

## 2020-03-25 DIAGNOSIS — M542 Cervicalgia: Secondary | ICD-10-CM

## 2020-03-25 NOTE — Therapy (Signed)
[facility] Outpatient Rehabilitation [facility] [address] [place], [place], [address] Phone: [phone]   Fax:  [phone]  Physical Therapy Treatment  Patient Details  Name: Selam Pietsch MRN: [id number] Date of Birth: 05/17/1941 Referring Provider (PT): Quentin Mulling, [place]   Encounter Date: 03/25/2020  PT End of Session - 03/25/20 1106    Visit Number  3    Number of Visits  9    Date for PT Re-Evaluation  04/27/20    Authorization Type  [employer] MCR: kx mod at 15th visit    Progress Note Due on Visit  10    PT Start Time  1101    PT Stop Time  1144    PT Time Calculation (min)  43 min    Activity Tolerance  Patient tolerated treatment well    Behavior During Therapy  [facility] for tasks assessed/performed       Past Medical History:  Diagnosis Date  . Anxiety   . Atrophic vaginitis   . Chest pain    a. Adenosine sestamibi (1/14) with no evidence of ischemia or infarction, EF 63%.   . Colon polyps   . Complete heart block (HCC) 06-2013   status post pacemaker implantation by Dr Johney Frame 06-11-2013  . Depression   . DI (detrusor instability)   . GERD (gastroesophageal reflux disease)    History + Hyplori via EGD  . H/O echocardiogram    a. Echo 11/2012:EF 50-55%, focal basal septal hypertrophy with some mitral valve SAM (mild) and MR.  This may be a sigmoid septum with advanced age or could be a variant of hypertrophic cardiomyopathy.  Marland Kitchen HTN (hypertension)   . Hyperlipidemia   . LBBB (left bundle branch block)   . Leukopenia 05/19/2014  . Lymphocytosis 05/19/2014  . Migraines   . Osteoporosis   . Paroxysmal atrial fibrillation (HCC)   . Prediabetes   . Vitamin D deficiency     Past Surgical History:  Procedure Laterality Date  . BREAST EXCISIONAL BIOPSY Right   . BREAST SURGERY     Breast cyst  . CATARACT EXTRACTION Left 2019   Dr. Dione Booze  . HEMORRHOID SURGERY    . LEFT HEART CATHETERIZATION WITH CORONARY ANGIOGRAM N/A 06/11/2013   Procedure: LEFT HEART CATHETERIZATION WITH CORONARY ANGIOGRAM;  Surgeon: Kathleene Hazel, MD;  Location: [facility] CATH LAB;  Service: Cardiovascular;  Laterality: N/A;  . PACEMAKER INSERTION  06-11-2013   STJ dual chamber pacemaker implanted by Dr Johney Frame for complete heart block  . PERMANENT PACEMAKER INSERTION Left 06/11/2013   Procedure: PERMANENT PACEMAKER INSERTION;  Surgeon: Kathleene Hazel, MD;  Location: [facility] CATH LAB;  Service: Cardiovascular;  Laterality: Left;  . PERMANENT PACEMAKER INSERTION N/A 06/11/2013   Procedure: PERMANENT PACEMAKER INSERTION;  Surgeon: Hillis Range, MD;  Location: [facility] CATH LAB;  Service: Cardiovascular;  Laterality: N/A;  . TEMPORARY PACEMAKER INSERTION N/A 06/11/2013   Procedure: TEMPORARY PACEMAKER INSERTION;  Surgeon: Kathleene Hazel, MD;  Location: [facility] CATH LAB;  Service: Cardiovascular;  Laterality: N/A;  . TUBAL LIGATION      There were no vitals filed for this visit.  Subjective Assessment - 03/25/20 1102    Subjective  "I don't have that real intense pain. but you still get some stiffness and soreness.    Diagnostic tests  CT scan on 04/08/2019 Facet mediated anterolisthesis at C3-4 and C7-T1 of a minor nature. No significant spinal stenosis or cord compression at any level.Multilevel facet arthropathy is noted, along with osteopenia. No  RIGHT-sided disc protrusion is evident, although LEFT-sidedforaminal narrowing is fairly pronounced at C3-4 due to bonyovergrowth. Large meningoceles at C7-T1, T1-T2, and T2-T3 as described.RIGHT-sided meningocele at T1-T2 is 4 cm diameter.    Currently in Pain?  Yes    Pain Score  4     Pain Location  Neck    Pain Orientation  Right         [facility] PT Assessment - 03/25/20 0001      Observation/Other Assessments   Focus on Therapeutic Outcomes (FOTO)   44% limited      AROM   Cervical - Left Side Bend  40   end range stiffness on the contralateral side                   [facility] Adult PT  Treatment/Exercise - 03/25/20 0001      Self-Care   Self-Care  Posture    Posture  appropriate sleeping positions regarding cervical positioning to reduce abnormal tension in the R upper trap/ levator scapuale      Neck Exercises: Theraband   Other Theraband Exercises  scapular retraction with ER 2 x 12 with red theraband      Neck Exercises: Supine   Neck Retraction  5 reps;10 secs    Capital Flexion  5 reps;10 secs   chin tuck head lift     Manual Therapy   Manual therapy comments  sub-occipital release, and MTPR along R upper trap/ levator scapuale    Joint Mobilization  C3-C7 PA grade III, R rib inferior mobs grade III    Manual Traction  cervical traction x 5 min      Neck Exercises: Stretches   Upper Trapezius Stretch  2 reps;Right;30 seconds    Levator Stretch  Right;2 reps;30 seconds             PT Education - 03/25/20 1127    Education Details  reivewed HEP and provided posture handout and highlighted sleeping positions and appropriate positioning for the head and neck    Person(s) Educated  Patient    Methods  Explanation;Verbal cues;Handout    Comprehension  Verbalized understanding;Verbal cues required       PT Short Term Goals - 03/25/20 1146      PT SHORT TERM GOAL #1   Title  pt to be I with inital HEP    Period  Weeks    Status  Achieved        PT Long Term Goals - 03/02/20 1254      PT LONG TERM GOAL #1   Title  pt to verbalize/ demo efficient posture and lifting mechancis to reduce muscle tension/ neck pain    Time  8    Period  Weeks    Status  New    Target Date  04/27/20      PT LONG TERM GOAL #2   Title  pt to maintain cervical mobility reporting </= 2/10 pain and maximize safety with driving for QOL    Time  6    Period  Weeks    Status  New    Target Date  04/27/20      PT LONG TERM GOAL #3   Title  increase FOTO score to </= 42% limited to demo improvement in function    Time  8    Period  Weeks    Status  New    Target  Date  04/27/20      PT LONG TERM  GOAL #4   Title  pt to be I with all HEP given to maintain and progress current level of function    Time  8    Period  Weeks    Status  New    Target Date  04/27/20            Plan - 03/25/20 1131    Clinical Impression Statement  pt reports more consistency with her HEP and reports relief of pain since the last time. Continued working STW along the posterior cervical structures. she responded well to posterior shoulder strengthening and reported no pain at end of session. time take to education sleeping posture and appropriate positioning to reduce upper trap activation.    PT Treatment/Interventions  ADLs/Self Care Home Management;Cryotherapy;Moist Heat;Therapeutic exercise;Therapeutic activities;Neuromuscular re-education;Passive range of motion;Taping;Patient/family education    PT Next Visit Plan  (no E-STIM for [place] hx of pacemaker) review/ update HEP PRN, NO dry needling due to psuedomeningocele, focus on MTRP and teach how to perofrm sub-occipital release, cervical isometrics, stretching, if tolerated well update for shoulder strengthening.    PT Home Exercise Plan  [id number] - Chin tuck, upper trap/ levator scapuale stretch, scap retraction, posture handout       Patient will benefit from skilled therapeutic intervention in order to improve the following deficits and impairments:  Pain, Improper body mechanics, Decreased strength  Visit Diagnosis: Cervicalgia  Abnormal posture  Other muscle spasm     Problem List Patient Active Problem List   Diagnosis Date Noted  . 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 Episodic tension-type headache, not intractable 02/12/2020  . Familial tremor 06/10/2019  . GERD (gastroesophageal reflux disease) 02/25/2019  . Major depressive disorder, recurrent episode, mild with anxious distress ([place]) 02/25/2019  . Pacemaker 02/25/2019  . Paroxysmal atrial fibrillation ([place]) 02/25/2019  . Chronic  anticoagulation 02/25/2019  . Hypertrophic obstructive cardiomyopathy ([place]) 09/21/2015  . Overweight (BMI 25.0-29.9) 09/01/2015  . Medication management 08/22/2014  . Vitamin D deficiency   . Other abnormal glucose (prediabetes)   . HTN (hypertension)   . Hyperlipidemia   . CHB (complete heart block) ([place]) 06/10/2013  . LBBB (left bundle branch block) 11/15/2012  . Osteoporosis   . DI (detrusor instability)   . Atrophic vaginitis    Starr Lake PT, DPT, LAT, ATC  03/25/20  11:47 AM      [facility] [address] [place], [place], [address] Phone: [phone]   Fax:  [phone]  Name: Kasheena Sambrano MRN: [id number] Date of Birth: Apr 17, 1941

## 2020-03-25 NOTE — Patient Instructions (Signed)

## 2020-04-01 ENCOUNTER — Encounter: Payer: Self-pay | Admitting: Physical Therapy

## 2020-04-01 ENCOUNTER — Ambulatory Visit: Payer: Medicare PPO | Admitting: Physical Therapy

## 2020-04-01 ENCOUNTER — Other Ambulatory Visit: Payer: Self-pay

## 2020-04-01 DIAGNOSIS — R293 Abnormal posture: Secondary | ICD-10-CM | POA: Diagnosis not present

## 2020-04-01 DIAGNOSIS — M62838 Other muscle spasm: Secondary | ICD-10-CM | POA: Diagnosis not present

## 2020-04-01 DIAGNOSIS — M542 Cervicalgia: Secondary | ICD-10-CM | POA: Diagnosis not present

## 2020-04-01 NOTE — Therapy (Signed)
[facility] [address] [place], [place], [address] Phone: [phone]   Fax:  [phone]  Physical Therapy Treatment  Patient Details  Name: Alexandra Wheeler MRN: [id number] Date of Birth: 1940-12-05 Referring Provider (PT): Quentin Mulling, [place]   Encounter Date: 04/01/2020  PT End of Session - 04/01/20 1052    Visit Number  4    Number of Visits  9    Date for PT Re-Evaluation  04/27/20    Authorization Type  [employer] MCR: kx mod at 15th visit    PT Start Time  1053    PT Stop Time  1135    PT Time Calculation (min)  42 min    Activity Tolerance  Patient tolerated treatment well    Behavior During Therapy  [facility] for tasks assessed/performed       Past Medical History:  Diagnosis Date  . Anxiety   . Atrophic vaginitis   . Chest pain    a. Adenosine sestamibi (1/14) with no evidence of ischemia or infarction, EF 63%.   . Colon polyps   . Complete heart block (HCC) 06-2013   status post pacemaker implantation by Dr Johney Frame 06-11-2013  . Depression   . DI (detrusor instability)   . GERD (gastroesophageal reflux disease)    History + Hyplori via EGD  . H/O echocardiogram    a. Echo 11/2012:EF 50-55%, focal basal septal hypertrophy with some mitral valve SAM (mild) and MR.  This may be a sigmoid septum with advanced age or could be a variant of hypertrophic cardiomyopathy.  Marland Kitchen HTN (hypertension)   . Hyperlipidemia   . LBBB (left bundle branch block)   . Leukopenia 05/19/2014  . Lymphocytosis 05/19/2014  . Migraines   . Osteoporosis   . Paroxysmal atrial fibrillation (HCC)   . Prediabetes   . Vitamin D deficiency     Past Surgical History:  Procedure Laterality Date  . BREAST EXCISIONAL BIOPSY Right   . BREAST SURGERY     Breast cyst  . CATARACT EXTRACTION Left 2019   Dr. Dione Booze  . HEMORRHOID SURGERY    . LEFT HEART CATHETERIZATION WITH CORONARY ANGIOGRAM N/A 06/11/2013   Procedure: LEFT HEART CATHETERIZATION WITH  CORONARY ANGIOGRAM;  Surgeon: Kathleene Hazel, MD;  Location: [facility] CATH LAB;  Service: Cardiovascular;  Laterality: N/A;  . PACEMAKER INSERTION  06-11-2013   STJ dual chamber pacemaker implanted by Dr Johney Frame for complete heart block  . PERMANENT PACEMAKER INSERTION Left 06/11/2013   Procedure: PERMANENT PACEMAKER INSERTION;  Surgeon: Kathleene Hazel, MD;  Location: [facility] CATH LAB;  Service: Cardiovascular;  Laterality: Left;  . PERMANENT PACEMAKER INSERTION N/A 06/11/2013   Procedure: PERMANENT PACEMAKER INSERTION;  Surgeon: Hillis Range, MD;  Location: [facility] CATH LAB;  Service: Cardiovascular;  Laterality: N/A;  . TEMPORARY PACEMAKER INSERTION N/A 06/11/2013   Procedure: TEMPORARY PACEMAKER INSERTION;  Surgeon: Kathleene Hazel, MD;  Location: [facility] CATH LAB;  Service: Cardiovascular;  Laterality: N/A;  . TUBAL LIGATION      There were no vitals filed for this visit.  Subjective Assessment - 04/01/20 1053    Subjective  "my neck is doing okay, but my head still hurts. I did my exercises last night and it helped, but I woke up this morning with continued pain. "    Diagnostic tests  CT scan on 04/08/2019 Facet mediated anterolisthesis at C3-4 and C7-T1 of a minor nature. No significant spinal stenosis or cord compression at any level.Multilevel facet arthropathy is  noted, along with osteopenia. No RIGHT-sided disc protrusion is evident, although LEFT-sidedforaminal narrowing is fairly pronounced at C3-4 due to bonyovergrowth. Large meningoceles at C7-T1, T1-T2, and T2-T3 as described.RIGHT-sided meningocele at T1-T2 is 4 cm diameter.    Patient Stated Goals  to decrease pain and make it go away.    Currently in Pain?  Yes    Pain Score  3          [facility] PT Assessment - 04/01/20 0001      Assessment   Medical Diagnosis  Pseudomeningocele of spinal cord G96.198, Chronic right-sided headache R51.9, G89.29    Referring Provider (PT)  Quentin Mulling, PA-C                     [facility] Adult PT Treatment/Exercise - 04/01/20 0001      Neck Exercises: Machines for Strengthening   UBE (Upper Arm Bike)  L1 x 4 min    fwd/bwd x 2 min     Neck Exercises: Theraband   Rows  15 reps;Red   seated on dyna disc   Horizontal ABduction  10 reps;Red   seated on dynadisc   Other Theraband Exercises  scapular retraction with ER 2 x 12 with red theraband   seated on dynadisc     Neck Exercises: Supine   Neck Retraction  5 reps;10 secs    Capital Flexion  5 reps;10 secs   2 sets 1 in neutral positioning and 1 with R rotation.     Manual Therapy   Manual therapy comments  sub-occipital release, and MTPR along R upper trap/ levator scapuale      Neck Exercises: Stretches   Upper Trapezius Stretch  Right;30 seconds;3 reps   last rep with overpressure   Levator Stretch  Right;2 reps;30 seconds   last rep with overpressure            PT Education - 04/01/20 1130    Education Details  Reviewed self trigger point release with tennis balls. dicussed importance of consistent exercise especially stretching. updated HEP today for posterior shoulder strengthening.    Person(s) Educated  Patient    Methods  Explanation;Verbal cues    Comprehension  Verbalized understanding;Verbal cues required       PT Short Term Goals - 03/25/20 1146      PT SHORT TERM GOAL #1   Title  pt to be I with inital HEP    Period  Weeks    Status  Achieved        PT Long Term Goals - 03/02/20 1254      PT LONG TERM GOAL #1   Title  pt to verbalize/ demo efficient posture and lifting mechancis to reduce muscle tension/ neck pain    Time  8    Period  Weeks    Status  New    Target Date  04/27/20      PT LONG TERM GOAL #2   Title  pt to maintain cervical mobility reporting </= 2/10 pain and maximize safety with driving for QOL    Time  6    Period  Weeks    Status  New    Target Date  04/27/20      PT LONG TERM GOAL #3   Title  increase FOTO score  to </= 42% limited to demo improvement in function    Time  8    Period  Weeks    Status  New  Target Date  04/27/20      PT LONG TERM GOAL #4   Title  pt to be I with all HEP given to maintain and progress current level of function    Time  8    Period  Weeks    Status  New    Target Date  04/27/20            Plan - 04/01/20 1120    Clinical Impression Statement  Mrs Meares is making progress with physical therpay reporting decreaesd pain / stiffness. she does report pain in the R side of her head that flcutuates but does responds well to sub-occipital release and exercies. continued STW today followed with stretch. She did well with posterior shoulder strengthening with core activation. plan to see pt back in 2-3 weeks for 1 more visit to assess response HEP and determine if more PT is need.    PT Treatment/Interventions  ADLs/Self Care Home Management;Cryotherapy;Moist Heat;Therapeutic exercise;Therapeutic activities;Neuromuscular re-education;Passive range of motion;Taping;Patient/family education    PT Next Visit Plan  (no E-STIM for [place] hx of pacemaker) review/ update HEP PRN, NO dry needling due to psuedomeningocele, focus on MTRP and teach how to perofrm sub-occipital release, how was she doing with exercises    PT Home Exercise Plan  [id number] - Chin tuck, upper trap/ levator scapuale stretch, scap retraction, posture handout, money exercise, and seated horizontal abduction    Consulted and Agree with Plan of Care  Patient       Patient will benefit from skilled therapeutic intervention in order to improve the following deficits and impairments:     Visit Diagnosis: Cervicalgia  Abnormal posture  Other muscle spasm     Problem List Patient Active Problem List   Diagnosis Date Noted  . 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 Episodic tension-type headache, not intractable 02/12/2020  . Familial tremor 06/10/2019  . GERD (gastroesophageal reflux disease)  02/25/2019  . Major depressive disorder, recurrent episode, mild with anxious distress ([place]) 02/25/2019  . Pacemaker 02/25/2019  . Paroxysmal atrial fibrillation ([place]) 02/25/2019  . Chronic anticoagulation 02/25/2019  . Hypertrophic obstructive cardiomyopathy ([place]) 09/21/2015  . Overweight (BMI 25.0-29.9) 09/01/2015  . Medication management 08/22/2014  . Vitamin D deficiency   . Other abnormal glucose (prediabetes)   . HTN (hypertension)   . Hyperlipidemia   . CHB (complete heart block) ([place]) 06/10/2013  . LBBB (left bundle branch block) 11/15/2012  . Osteoporosis   . DI (detrusor instability)   . Atrophic vaginitis    Starr Lake PT, DPT, LAT, ATC  04/01/20  11:37 AM      [facility] [address] [place], [place], [address] Phone: [phone]   Fax:  [phone]  Name: Onia Shiflett MRN: [id number] Date of Birth: Mar 10, 1941

## 2020-04-08 ENCOUNTER — Ambulatory Visit (INDEPENDENT_AMBULATORY_CARE_PROVIDER_SITE_OTHER): Payer: Medicare PPO | Admitting: *Deleted

## 2020-04-08 DIAGNOSIS — I442 Atrioventricular block, complete: Secondary | ICD-10-CM

## 2020-04-08 LAB — CUP PACEART REMOTE DEVICE CHECK
Battery Remaining Longevity: 98 mo
Battery Remaining Percentage: 80 %
Battery Voltage: 2.93 V
Brady Statistic AP VP Percent: 95 %
Brady Statistic AP VS Percent: 1 %
Brady Statistic AS VP Percent: 4.5 %
Brady Statistic AS VS Percent: 1 %
Brady Statistic RA Percent Paced: 95 %
Brady Statistic RV Percent Paced: 99 %
Date Time Interrogation Session: 20210602020014
Implantable Lead Implant Date: 20140805
Implantable Lead Implant Date: 20140805
Implantable Lead Location: 753859
Implantable Lead Location: 753860
Implantable Lead Model: 1944
Implantable Lead Model: 1948
Implantable Pulse Generator Implant Date: 20140805
Lead Channel Impedance Value: 550 Ohm
Lead Channel Impedance Value: 630 Ohm
Lead Channel Pacing Threshold Amplitude: 0.5 V
Lead Channel Pacing Threshold Amplitude: 0.75 V
Lead Channel Pacing Threshold Pulse Width: 0.4 ms
Lead Channel Pacing Threshold Pulse Width: 0.4 ms
Lead Channel Sensing Intrinsic Amplitude: 12 mV
Lead Channel Sensing Intrinsic Amplitude: 2.6 mV
Lead Channel Setting Pacing Amplitude: 1 V
Lead Channel Setting Pacing Amplitude: 2 V
Lead Channel Setting Pacing Pulse Width: 0.4 ms
Lead Channel Setting Sensing Sensitivity: 4 mV
Pulse Gen Model: 2240
Pulse Gen Serial Number: 7523768

## 2020-04-14 NOTE — Progress Notes (Signed)
Remote pacemaker transmission.   

## 2020-04-21 ENCOUNTER — Other Ambulatory Visit: Payer: Self-pay | Admitting: Internal Medicine

## 2020-04-21 DIAGNOSIS — R450 Nervousness: Secondary | ICD-10-CM

## 2020-04-21 MED ORDER — ALPRAZOLAM 1 MG PO TABS: ORAL_TABLET | ORAL | 0 refills | Status: DC

## 2020-04-22 ENCOUNTER — Encounter: Payer: Self-pay | Admitting: Physical Therapy

## 2020-04-22 ENCOUNTER — Other Ambulatory Visit: Payer: Self-pay

## 2020-04-22 ENCOUNTER — Ambulatory Visit: Payer: Medicare PPO | Attending: Physician Assistant | Admitting: Physical Therapy

## 2020-04-22 DIAGNOSIS — M62838 Other muscle spasm: Secondary | ICD-10-CM | POA: Diagnosis not present

## 2020-04-22 DIAGNOSIS — M542 Cervicalgia: Secondary | ICD-10-CM | POA: Insufficient documentation

## 2020-04-22 DIAGNOSIS — R293 Abnormal posture: Secondary | ICD-10-CM | POA: Insufficient documentation

## 2020-04-22 NOTE — Therapy (Signed)
[place], [place], [address] Phone: [phone]   Fax:  [phone]  Physical Therapy Treatment / Discharge  Patient Details  Name: Alexandra Wheeler MRN: [id number] Date of Birth: 1941/06/08 Referring Provider (PT): Vicie Mutters, [place]   Encounter Date: 04/22/2020   PT End of Session - 04/22/20 1038    Visit Number 5    Number of Visits 9    Date for PT Re-Evaluation 04/27/20    Authorization Type [employer] MCR: kx mod at 15th visit    Progress Note Due on Visit 10    PT Start Time 1035    PT Stop Time 1113    PT Time Calculation (min) 38 min    Activity Tolerance Patient tolerated treatment well           Past Medical History:  Diagnosis Date  . Anxiety   . Atrophic vaginitis   . Chest pain    a. Adenosine sestamibi (1/14) with no evidence of ischemia or infarction, EF 63%.   . Colon polyps   . Complete heart block ([place]) 06-2013   status post pacemaker implantation by Dr Rayann Heman 06-11-2013  . Depression   . DI (detrusor instability)   . GERD (gastroesophageal reflux disease)    History + Hyplori via EGD  . H/O echocardiogram    a. Echo 11/2012:EF 50-55%, focal basal septal hypertrophy with some mitral valve SAM (mild) and MR.  This may be a sigmoid septum with advanced age or could be a variant of hypertrophic cardiomyopathy.  Marland Kitchen HTN (hypertension)   . Hyperlipidemia   . LBBB (left bundle branch block)   . Leukopenia 05/19/2014  . Lymphocytosis 05/19/2014  . Migraines   . Osteoporosis   . Paroxysmal atrial fibrillation (HCC)   . Prediabetes   . Vitamin D deficiency     Past Surgical History:  Procedure Laterality Date  . BREAST EXCISIONAL BIOPSY Right   . BREAST SURGERY     Breast cyst  . CATARACT EXTRACTION Left 2019   Dr. Katy Fitch  . HEMORRHOID SURGERY    . LEFT HEART CATHETERIZATION WITH CORONARY ANGIOGRAM N/A 06/11/2013   Procedure: LEFT HEART CATHETERIZATION WITH CORONARY  ANGIOGRAM;  Surgeon: Burnell Blanks, MD;  Location: [facility] CATH LAB;  Service: Cardiovascular;  Laterality: N/A;  . PACEMAKER INSERTION  06-11-2013   STJ dual chamber pacemaker implanted by Dr Rayann Heman for complete heart block  . PERMANENT PACEMAKER INSERTION Left 06/11/2013   Procedure: PERMANENT PACEMAKER INSERTION;  Surgeon: Burnell Blanks, MD;  Location: [facility] CATH LAB;  Service: Cardiovascular;  Laterality: Left;  . PERMANENT PACEMAKER INSERTION N/A 06/11/2013   Procedure: PERMANENT PACEMAKER INSERTION;  Surgeon: Thompson Grayer, MD;  Location: [facility] CATH LAB;  Service: Cardiovascular;  Laterality: N/A;  . TEMPORARY PACEMAKER INSERTION N/A 06/11/2013   Procedure: TEMPORARY PACEMAKER INSERTION;  Surgeon: Burnell Blanks, MD;  Location: [facility] CATH LAB;  Service: Cardiovascular;  Laterality: N/A;  . TUBAL LIGATION      There were no vitals filed for this visit.   Subjective Assessment - 04/22/20 1039    Subjective "I have to be honest I still feel some soreness/ stiffness in the neck. I don't know if I slept wrong one night while I was away"    Diagnostic tests CT scan on 04/08/2019 Facet mediated anterolisthesis at C3-4 and C7-T1 of a minor nature. No significant spinal stenosis or cord compression at any level.Multilevel facet arthropathy is noted, along with osteopenia. No RIGHT-sided  disc protrusion is evident, although LEFT-sidedforaminal narrowing is fairly pronounced at C3-4 due to bonyovergrowth. Large meningoceles at C7-T1, T1-T2, and T2-T3 as described.RIGHT-sided meningocele at T1-T2 is 4 cm diameter.    Currently in Pain? Yes    Pain Location Neck    Pain Orientation Right    Pain Descriptors / Indicators Aching;Tightness    Pain Onset More than a month ago    Pain Frequency Intermittent    Aggravating Factors  sneezing violently/ coughing.    Pain Relieving Factors tylenol/ ibuprofen, heating pad              [facility] PT Assessment - 04/22/20 0001      Assessment   Medical  Diagnosis Pseudomeningocele of spinal cord G96.198, Chronic right-sided headache R51.9, G89.29    Referring Provider (PT) Vicie Mutters, PA-C      Observation/Other Assessments   Focus on Therapeutic Outcomes (FOTO)  39% limited      AROM   Cervical Flexion 68    Cervical Extension 48    Cervical - Right Side Bend 40    Cervical - Left Side Bend 40    Cervical - Right Rotation 62    Cervical - Left Rotation 60                         [facility] Adult PT Treatment/Exercise - 04/22/20 0001      Neck Exercises: Seated   Other Seated Exercise scapular retraction 1 x 10 holding 5 seconds   with green theraband     Manual Therapy   Manual therapy comments sub-occipital release, and MTPR along R upper trap/ levator scapuale    Joint Mobilization C3-C7 PA grade III, R rib inferior mobs grade III      Neck Exercises: Stretches   Upper Trapezius Stretch Right;1 rep    Levator Stretch Right;30 seconds;1 rep                  PT Education - 04/22/20 1112    Education Details reviewed HEP and importance of consistency. How to conitnue to progress strength and endurance with increased reps/ sets and resistance. reviewed efficient posture to reduce abnormal tension.    Person(s) Educated Patient    Methods Explanation;Verbal cues    Comprehension Verbalized understanding;Verbal cues required            PT Short Term Goals - 03/25/20 1146      PT SHORT TERM GOAL #1   Title pt to be I with inital HEP    Period Weeks    Status Achieved             PT Long Term Goals - 04/22/20 1045      PT LONG TERM GOAL #1   Title pt to verbalize/ demo efficient posture and lifting mechancis to reduce muscle tension/ neck pain    Baseline pt notes she isn't the best with posture, but is able to verbalize posture    Period Weeks    Status Partially Met      PT LONG TERM GOAL #2   Title pt to maintain cervical mobility reporting </= 2/10 pain and maximize safety with  driving for QOL    Period Weeks    Status Achieved      PT LONG TERM GOAL #3   Title increase FOTO score to </= 42% limited to demo improvement in function    Period Weeks    Status Achieved  PT LONG TERM GOAL #4   Title pt to be I with all HEP given to maintain and progress current level of function    Period Weeks    Status Achieved                 Plan - 04/22/20 1114    Clinical Impression Statement Mrs Dedominicis has made excellent progress with physicla therapy increased cervical ROM and reports max pain at 2/10 but reports it as more intermittent. she conitnues to responed well to manual relief techniques which she notes doing consistently at home. She was able to perform all exerecises with no issues. She met all goals today and is able to maintain and progress her current level of function independenlty and will be discharged from PT today.    PT Treatment/Interventions ADLs/Self Care Home Management;Cryotherapy;Moist Heat;Therapeutic exercise;Therapeutic activities;Neuromuscular re-education;Passive range of motion;Taping;Patient/family education    PT Next Visit Plan D/C    PT Home Exercise Plan [id number] - Chin tuck, upper trap/ levator scapuale stretch, scap retraction, posture handout, money exercise, and seated horizontal abduction    Consulted and Agree with Plan of Care Patient           Patient will benefit from skilled therapeutic intervention in order to improve the following deficits and impairments:  Pain, Improper body mechanics, Decreased strength  Visit Diagnosis: Cervicalgia  Abnormal posture  Other muscle spasm     Problem List Patient Active Problem List   Diagnosis Date Noted  . 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 Episodic tension-type headache, not intractable 02/12/2020  . Familial tremor 06/10/2019  . GERD (gastroesophageal reflux disease) 02/25/2019  . Major depressive disorder, recurrent episode, mild with anxious  distress ([place]) 02/25/2019  . Pacemaker 02/25/2019  . Paroxysmal atrial fibrillation ([place]) 02/25/2019  . Chronic anticoagulation 02/25/2019  . Hypertrophic obstructive cardiomyopathy ([place]) 09/21/2015  . Overweight (BMI 25.0-29.9) 09/01/2015  . Medication management 08/22/2014  . Vitamin D deficiency   . Other abnormal glucose (prediabetes)   . HTN (hypertension)   . Hyperlipidemia   . CHB (complete heart block) ([place]) 06/10/2013  . LBBB (left bundle branch block) 11/15/2012  . Osteoporosis   . DI (detrusor instability)   . Atrophic vaginitis     Starr Lake 04/22/2020, 11:20 AM  [facility] [address] [place], [place], [address] Phone: [phone]   Fax:  [phone]  Name: Estephanie Hubbs MRN: [id number] Date of Birth: 1940-12-13       PHYSICAL THERAPY DISCHARGE SUMMARY  Visits from Start of Care: 5  Current functional level related to goals / functional outcomes: See goals,  FOTO 39% limited   Remaining deficits: See assessment in note   Education / Equipment: HEP, theraband, posture, lifting mechanics, anatomy of area involved  Plan: Patient agrees to discharge.  Patient goals were partially met. Patient is being discharged due to being pleased with the current functional level.  ?????         Aliea Bobe PT, DPT, LAT, ATC  04/22/20  11:20 AM

## 2020-05-12 ENCOUNTER — Other Ambulatory Visit: Payer: Self-pay | Admitting: *Deleted

## 2020-05-12 MED ORDER — TRETINOIN (EMOLLIENT) 0.05 % EX CREA: TOPICAL_CREAM | CUTANEOUS | 4 refills | Status: DC

## 2020-05-13 ENCOUNTER — Other Ambulatory Visit: Payer: Self-pay | Admitting: *Deleted

## 2020-05-13 MED ORDER — TRETINOIN (EMOLLIENT) 0.05 % EX CREA: TOPICAL_CREAM | CUTANEOUS | 4 refills | Status: DC

## 2020-05-15 ENCOUNTER — Other Ambulatory Visit: Payer: Self-pay | Admitting: Internal Medicine

## 2020-05-18 ENCOUNTER — Other Ambulatory Visit: Payer: Self-pay | Admitting: *Deleted

## 2020-05-18 MED ORDER — TRETINOIN (FACIAL WRINKLES) 0.05 % EX CREA: TOPICAL_CREAM | CUTANEOUS | 1 refills | Status: DC

## 2020-06-07 ENCOUNTER — Encounter: Payer: Self-pay | Admitting: Internal Medicine

## 2020-06-07 NOTE — Progress Notes (Signed)
History of Present Illness:      This very nice 79 y.o.  WWF presents for 6 month follow up with HTN, ASHD/pAfib & PPM, HLD, Pre-Diabetes and Vitamin D Deficiency. Patient's GERD is controlled on her meds.       Patient is treated for HTN (2005) & BP has been controlled at home. Today's BP is at goal - 128/76.  Patient had a PPM implanted in 2014 for CHB by Dr Johney Frame.  In Dec 2019, she was dx'd with  pAfib & was started on Eliquis.  Patient has had no complaints of any cardiac type chest pain, palpitations, dyspnea / orthopnea / PND, dizziness, claudication, or dependent edema.      Hyperlipidemia is controlled with diet & meds. Patient denies myalgias or other med SE's. Last Lipids were not at goal:  Lab Results  Component Value Date   CHOL 174 12/03/2019   HDL 48 (L) 12/03/2019   LDLCALC 107 (H) 12/03/2019   TRIG 94 12/03/2019   CHOLHDL 3.6 12/03/2019    Also, the patient has history of PreDiabetes (A1c 5.9% / 2011 & 2016)   and has had no symptoms of reactive hypoglycemia, diabetic polys, paresthesias or visual blurring.  Last A1c was not at goal:  Lab Results  Component Value Date   HGBA1C 5.8 (H) 12/03/2019           Further, the patient also has history of Vitamin D Deficiency ("34" / 2008)  and supplements vitamin D without any suspected side-effects. Last vitamin D was at goal:   Lab Results  Component Value Date   VD25OH 39 12/03/2019    Current Outpatient Medications on File Prior to Visit  Medication Sig  . ALPRAZolam (XANAX) 1 MG tablet Take 1/2 - 1 tablet 2 - 3 x /day ONLY if needed for Anxiety Attack &  limit to 5 days /week to avoid addiction  . Ascorbic Acid (VITAMIN C PO) Take 1 tablet by mouth daily.   . Calcium Carbonate-Vitamin D (CALCIUM + D PO) Take 1 tablet by mouth daily.   . Cholecalciferol (VITAMIN D PO) Take 5,000 Units by mouth 2 (two) times a day.   . citalopram (CELEXA) 20 MG tablet Take 1 tablet Daily for Mood  . fluticasone (FLONASE) 50  MCG/ACT nasal spray Place 2 sprays into both nostrils daily. (Patient taking differently: Place 2 sprays into both nostrils daily as needed for allergies or rhinitis. )  . FOLIC ACID PO Take 1 tablet by mouth daily.   . hydrochlorothiazide (HYDRODIURIL) 25 MG tablet Take 25 mg by mouth daily as needed (swelling).   . hydrocortisone (PROCTOZONE-HC) 2.5 % rectal cream PLACE 1 APPLICATION RECTALLY 2 (TWO) TIMES DAILY.  Marland Kitchen ibuprofen (IBU) 600 MG tablet Take 1 tablet 3 x  /day with food for Inflammation & Pain  . MAGNESIUM PO Take 500 mg by mouth daily.   . metoprolol succinate (TOPROL-XL) 100 MG 24 hr tablet Take 1 tablet Daily for BP & Heart  . Multiple Vitamin (MULTIVITAMIN) tablet Take 1 tablet by mouth daily.  . pantoprazole (PROTONIX) 40 MG tablet Take 1 tablet Daily with Supper for Indigestion & Acid Reflux  . Tretinoin, Facial Wrinkles, 0.05 % CREA APPLY TO AFFECTED AREA EVERY DAY AS DIRECTED   No current facility-administered medications on file prior to visit.    Allergies  Allergen Reactions  . Levsin [Hyoscyamine Sulfate]     unknown  . Mobic [Meloxicam]  unknown   . Topamax [Topiramate]     unknown   . Zoloft [Sertraline Hcl]     unknown   . Actonel [Risedronate Sodium] Other (See Comments)    Pt didn't like the way it made her feel   . Boniva [Ibandronic Acid] Other (See Comments)    Pt didn't like the way it made her feel  . Evista [Raloxifene] Other (See Comments)    Pt didn't like the way it made her feel  . Fosamax [Alendronate Sodium] Other (See Comments)    Pt didn't like the way it made her feel     PMHx:   Past Medical History:  Diagnosis Date  . Anxiety   . Atrophic vaginitis   . Chest pain    a. Adenosine sestamibi (1/14) with no evidence of ischemia or infarction, EF 63%.   . Colon polyps   . Complete heart block (HCC) 06-2013   status post pacemaker implantation by Dr Johney FrameAllred 06-11-2013  . Depression   . DI (detrusor instability)   . GERD  (gastroesophageal reflux disease)    History + Hyplori via EGD  . H/O echocardiogram    a. Echo 11/2012:EF 50-55%, focal basal septal hypertrophy with some mitral valve SAM (mild) and MR.  This may be a sigmoid septum with advanced age or could be a variant of hypertrophic cardiomyopathy.  Marland Kitchen. HTN (hypertension)   . Hyperlipidemia   . LBBB (left bundle branch block)   . Leukopenia 05/19/2014  . Lymphocytosis 05/19/2014  . Migraines   . Osteoporosis   . Paroxysmal atrial fibrillation (HCC)   . Prediabetes   . Vitamin D deficiency     Immunization History  Administered Date(s) Administered  . Fluad Quad(high Dose 65+) 07/25/2019  . Influenza Split 08/06/2014, 09/20/2017  . Influenza, High Dose Seasonal PF 09/01/2015  . Influenza-Unspecified 09/12/2018  . Pneumococcal Conjugate-13 09/01/2015  . Pneumococcal-Unspecified 10/08/2010  . Td 05/09/2011  . Zoster 05/09/2011    Past Surgical History:  Procedure Laterality Date  . BREAST EXCISIONAL BIOPSY Right   . BREAST SURGERY     Breast cyst  . CATARACT EXTRACTION Left 2019   Dr. Dione BoozeGroat  . HEMORRHOID SURGERY    . LEFT HEART CATHETERIZATION WITH CORONARY ANGIOGRAM N/A 06/11/2013   Procedure: LEFT HEART CATHETERIZATION WITH CORONARY ANGIOGRAM;  Surgeon: Kathleene Hazelhristopher D McAlhany, MD;  [facility];  Service: Cardiovascular;  Laterality: N/A;  . PACEMAKER INSERTION  06-11-2013   STJ dual chamber pacemaker implanted by Dr Johney FrameAllred for complete heart block  . PERMANENT PACEMAKER INSERTION Left 06/11/2013   Procedure: PERMANENT PACEMAKER INSERTION;  Surgeon: Kathleene Hazelhristopher D McAlhany, MD;  Location[facility];  Service: Cardiovascular;  Laterality: Left;  . PERMANENT PACEMAKER INSERTION N/A 06/11/2013   Procedure: PERMANENT PACEMAKER INSERTION;  Surgeon: Hillis RangeJames Allred, MD;  [facility];  Service: Cardiovascular;  Laterality: N/A;  . TEMPORARY PACEMAKER INSERTION N/A 06/11/2013   Procedure: TEMPORARY PACEMAKER INSERTION;  Surgeon:  Kathleene Hazelhristopher D McAlhany, MD;  [facility];  Service: Cardiovascular;  Laterality: N/A;  . TUBAL LIGATION      FHx:    Reviewed / unchanged  SHx:    Reviewed / unchanged   Systems Review:  Constitutional: Denies fever, chills, wt changes, headaches, insomnia, fatigue, night sweats, change in appetite. Eyes: Denies redness, blurred vision, diplopia, discharge, itchy, watery eyes.  ENT: Denies discharge, congestion, post nasal drip, epistaxis, sore throat, earache, hearing loss, dental pain, tinnitus, vertigo, sinus pain, snoring.  CV:  Denies chest pain, palpitations, irregular heartbeat, syncope, dyspnea, diaphoresis, orthopnea, PND, claudication or edema. Respiratory: denies cough, dyspnea, DOE, pleurisy, hoarseness, laryngitis, wheezing.  Gastrointestinal: Denies dysphagia, odynophagia, heartburn, reflux, water brash, abdominal pain or cramps, nausea, vomiting, bloating, diarrhea, constipation, hematemesis, melena, hematochezia  or hemorrhoids. Genitourinary: Denies dysuria, frequency, urgency, nocturia, hesitancy, discharge, hematuria or flank pain. Musculoskeletal: Denies arthralgias, myalgias, stiffness, jt. swelling, pain, limping or strain/sprain.  Skin: Denies pruritus, rash, hives, warts, acne, eczema or change in skin lesion(s). Neuro: No weakness, tremor, incoordination, spasms, paresthesia or pain. Psychiatric: Denies confusion, memory loss or sensory loss. Endo: Denies change in weight, skin or hair change.  Heme/Lymph: No excessive bleeding, bruising or enlarged lymph nodes.  Physical Exam  BP 128/76   Pulse 60   Temp 97.9 F (36.6 C)   Resp 16   Ht 5\' 4"  (1.626 m)   Wt 166 lb 3.2 oz (75.4 kg)   BMI 28.53 kg/m   Appears  well nourished, well groomed  and in no distress.  Eyes: PERRLA, EOMs, conjunctiva no swelling or erythema. Sinuses: No frontal/maxillary tenderness ENT/Mouth: EAC's clear, TM's nl w/o erythema, bulging. Nares clear w/o erythema, swelling,  exudates. Oropharynx clear without erythema or exudates. Oral hygiene is good. Tongue normal, non obstructing. Hearing intact.  Neck: Supple. Thyroid not palpable. Car 2+/2+ without bruits, nodes or JVD. Chest: Respirations nl with BS clear & equal w/o rales, rhonchi, wheezing or stridor.  Cor: Heart sounds normal w/ regular rate and rhythm without sig. murmurs, gallops, clicks or rubs. Peripheral pulses normal and equal  without edema.  Abdomen: Soft & bowel sounds normal. Non-tender w/o guarding, rebound, hernias, masses or organomegaly.  Lymphatics: Unremarkable.  Musculoskeletal: Full ROM all peripheral extremities, joint stability, 5/5 strength and normal gait.  Skin: Warm, dry without exposed rashes, lesions or ecchymosis apparent.  Neuro: Cranial nerves intact, reflexes equal bilaterally. Sensory-motor testing grossly intact. Tendon reflexes grossly intact.  Pysch: Alert & oriented x 3.  Insight and judgement nl & appropriate. No ideations.  Assessment and Plan:  1. Essential hypertension  - Continue medication, monitor blood pressure at home.  - Continue DASH diet.  Reminder to go to the ER if any CP,  SOB, nausea, dizziness, severe HA, changes vision/speech.  - CBC with Differential/Platelet - COMPLETE METABOLIC PANEL WITH GFR - Magnesium - TSH  2. Hyperlipidemia, mixed  - Continue diet/meds, exercise,& lifestyle modifications.  - Continue monitor periodic cholesterol/liver & renal functions   - Lipid panel - TSH  3. Abnormal glucose  - Continue diet, exercise  - Lifestyle modifications.  - Monitor appropriate labs.  - Hemoglobin A1c - Insulin, random  4. Vitamin D deficiency  - Continue supplementation.  - VITAMIN D 25 Hydroxy (Vit-D Deficiency, Fractures)  5. Pacemaker  - Hemoglobin A1c  6. Medication management  - CBC with Differential/Platelet - COMPLETE METABOLIC PANEL WITH GFR - Magnesium - Lipid panel - TSH - Hemoglobin A1c - Insulin,  random - VITAMIN D 25 Hydroxy         Discussed  regular exercise, BP monitoring, weight control to achieve/maintain BMI less than 25 and discussed med and SE's. Recommended labs to assess and monitor clinical status with further disposition pending results of labs.  I discussed the assessment and treatment plan with the patient. The patient was provided an opportunity to ask questions and all were answered. The patient agreed with the plan and demonstrated an understanding of the instructions.  I provided over 30 minutes of exam, counseling,  chart review and  complex critical decision making.   Marinus Maw, MD

## 2020-06-07 NOTE — Patient Instructions (Signed)

## 2020-06-08 ENCOUNTER — Other Ambulatory Visit: Payer: Self-pay

## 2020-06-08 ENCOUNTER — Ambulatory Visit: Payer: Medicare PPO | Admitting: Internal Medicine

## 2020-06-08 VITALS — BP 128/76 | HR 60 | Temp 97.9°F | Resp 16 | Ht 64.0 in | Wt 166.2 lb

## 2020-06-08 DIAGNOSIS — I10 Essential (primary) hypertension: Secondary | ICD-10-CM

## 2020-06-08 DIAGNOSIS — E559 Vitamin D deficiency, unspecified: Secondary | ICD-10-CM | POA: Diagnosis not present

## 2020-06-08 DIAGNOSIS — Z79899 Other long term (current) drug therapy: Secondary | ICD-10-CM | POA: Diagnosis not present

## 2020-06-08 DIAGNOSIS — E782 Mixed hyperlipidemia: Secondary | ICD-10-CM

## 2020-06-08 DIAGNOSIS — Z950 Presence of cardiac pacemaker: Secondary | ICD-10-CM

## 2020-06-08 DIAGNOSIS — R7309 Other abnormal glucose: Secondary | ICD-10-CM | POA: Diagnosis not present

## 2020-06-09 LAB — LIPID PANEL
Cholesterol: 200 mg/dL — ABNORMAL HIGH (ref <200)
HDL: 52 mg/dL (ref >=50)
LDL Cholesterol (Calc): 127 mg/dL (calc) — ABNORMAL HIGH
Non-HDL Cholesterol (Calc): 148 mg/dL (calc) — ABNORMAL HIGH (ref <130)
Total CHOL/HDL Ratio: 3.8 (calc) (ref <5.0)
Triglycerides: 106 mg/dL (ref <150)

## 2020-06-09 LAB — CBC WITH DIFFERENTIAL/PLATELET
Absolute Monocytes: 420 cells/uL (ref 200–950)
Basophils Absolute: 38 cells/uL (ref 0–200)
Basophils Relative: 0.9 %
Eosinophils Absolute: 101 cells/uL (ref 15–500)
Eosinophils Relative: 2.4 %
HCT: 40.5 % (ref 35.0–45.0)
Hemoglobin: 13.2 g/dL (ref 11.7–15.5)
Lymphs Abs: 1432 cells/uL (ref 850–3900)
MCH: 28.5 pg (ref 27.0–33.0)
MCHC: 32.6 g/dL (ref 32.0–36.0)
MCV: 87.5 fL (ref 80.0–100.0)
MPV: 11.1 fL (ref 7.5–12.5)
Monocytes Relative: 10 %
Neutro Abs: 2209 cells/uL (ref 1500–7800)
Neutrophils Relative %: 52.6 %
Platelets: 200 10*3/uL (ref 140–400)
RBC: 4.63 10*6/uL (ref 3.80–5.10)
RDW: 12.6 % (ref 11.0–15.0)
Total Lymphocyte: 34.1 %
WBC: 4.2 10*3/uL (ref 3.8–10.8)

## 2020-06-09 LAB — COMPLETE METABOLIC PANEL WITH GFR
AG Ratio: 1.6 (calc) (ref 1.0–2.5)
ALT: 14 U/L (ref 6–29)
AST: 17 U/L (ref 10–35)
Albumin: 4.1 g/dL (ref 3.6–5.1)
Alkaline phosphatase (APISO): 96 U/L (ref 37–153)
BUN: 13 mg/dL (ref 7–25)
CO2: 27 mmol/L (ref 20–32)
Calcium: 9.4 mg/dL (ref 8.6–10.4)
Chloride: 103 mmol/L (ref 98–110)
Creat: 0.85 mg/dL (ref 0.60–0.93)
GFR, Est African American: 76 mL/min/{1.73_m2} (ref >=60)
GFR, Est Non African American: 65 mL/min/{1.73_m2} (ref >=60)
Globulin: 2.6 g/dL (calc) (ref 1.9–3.7)
Glucose, Bld: 99 mg/dL (ref 65–99)
Potassium: 4.6 mmol/L (ref 3.5–5.3)
Sodium: 138 mmol/L (ref 135–146)
Total Bilirubin: 0.4 mg/dL (ref 0.2–1.2)
Total Protein: 6.7 g/dL (ref 6.1–8.1)

## 2020-06-09 LAB — TSH: TSH: 1.11 mIU/L (ref 0.40–4.50)

## 2020-06-09 LAB — INSULIN, RANDOM: Insulin: 8.7 u[IU]/mL

## 2020-06-09 LAB — HEMOGLOBIN A1C
Hgb A1c MFr Bld: 5.7 % of total Hgb — ABNORMAL HIGH (ref <5.7)
Mean Plasma Glucose: 117 (calc)
eAG (mmol/L): 6.5 (calc)

## 2020-06-09 LAB — MAGNESIUM: Magnesium: 2.3 mg/dL (ref 1.5–2.5)

## 2020-06-09 LAB — VITAMIN D 25 HYDROXY (VIT D DEFICIENCY, FRACTURES): Vit D, 25-Hydroxy: 84 ng/mL (ref 30–100)

## 2020-06-09 NOTE — Progress Notes (Signed)
=========================================================  -   Total Chol = 200 - slightly elevated   (Ideal or goal is less than 180)   - But   - Bad / Dangerous LDL Chol = 127 is very, very high  (Ideal or Goal is less than 70 ! )   - So...................  - Recommend a stricter low cholesterol diet   - Cholesterol only comes from animal sources  - ie. meat, dairy, egg yolks  - Eat all the vegetables you want.  - Avoid meat, especially red meat - Beef AND Pork .  - Avoid cheese & dairy - milk & ice cream.     - Cheese is the most concentrated form of trans-fats which  is the worst thing to clog up our arteries.   - Veggie cheese is OK which can be found in the fresh  produce section at [employer] or [employer] or [employer] =========================================================  - A1c = 5.7% Blood sugar and A1c are STILL elevated in the borderline  and  early or pre-diabetes range which has the same   300% increased risk for heart attack, stroke, cancer and   alzheimer- type vascular dementia as full blown diabetes.   But the good news is that diet, exercise with  weight loss can cure the early diabetes at this point. =========================================================  - Vitamin D = 84 = Excellent  =========================================================  - All Else - CBC - Kidneys - Electrolytes - Liver - Magnesium & Thyroid    - all  Normal / OK ====================================================

## 2020-06-10 NOTE — Progress Notes (Signed)
PATIENT IS AWARE OF LAB RESULTS AND INSTRUCTIONS. -E WELCH

## 2020-07-08 ENCOUNTER — Ambulatory Visit (INDEPENDENT_AMBULATORY_CARE_PROVIDER_SITE_OTHER): Payer: Medicare PPO | Admitting: *Deleted

## 2020-07-08 DIAGNOSIS — I442 Atrioventricular block, complete: Secondary | ICD-10-CM

## 2020-07-09 LAB — CUP PACEART REMOTE DEVICE CHECK
Battery Remaining Longevity: 89 mo
Battery Remaining Percentage: 72 %
Battery Voltage: 2.92 V
Brady Statistic AP VP Percent: 95 %
Brady Statistic AP VS Percent: 1 %
Brady Statistic AS VP Percent: 4.2 %
Brady Statistic AS VS Percent: 1 %
Brady Statistic RA Percent Paced: 95 %
Brady Statistic RV Percent Paced: 99 %
Date Time Interrogation Session: 20210901020012
Implantable Lead Implant Date: 20140805
Implantable Lead Implant Date: 20140805
Implantable Lead Location: 753859
Implantable Lead Location: 753860
Implantable Lead Model: 1944
Implantable Lead Model: 1948
Implantable Pulse Generator Implant Date: 20140805
Lead Channel Impedance Value: 580 Ohm
Lead Channel Impedance Value: 640 Ohm
Lead Channel Pacing Threshold Amplitude: 0.5 V
Lead Channel Pacing Threshold Amplitude: 0.75 V
Lead Channel Pacing Threshold Pulse Width: 0.4 ms
Lead Channel Pacing Threshold Pulse Width: 0.4 ms
Lead Channel Sensing Intrinsic Amplitude: 12 mV
Lead Channel Sensing Intrinsic Amplitude: 3.1 mV
Lead Channel Setting Pacing Amplitude: 1 V
Lead Channel Setting Pacing Amplitude: 2 V
Lead Channel Setting Pacing Pulse Width: 0.4 ms
Lead Channel Setting Sensing Sensitivity: 4 mV
Pulse Gen Model: 2240
Pulse Gen Serial Number: 7523768

## 2020-07-10 NOTE — Progress Notes (Signed)
Remote pacemaker transmission.   

## 2020-07-20 ENCOUNTER — Other Ambulatory Visit: Payer: Self-pay | Admitting: *Deleted

## 2020-07-20 ENCOUNTER — Other Ambulatory Visit: Payer: Self-pay | Admitting: Internal Medicine

## 2020-07-20 DIAGNOSIS — Z1231 Encounter for screening mammogram for malignant neoplasm of breast: Secondary | ICD-10-CM

## 2020-07-21 ENCOUNTER — Other Ambulatory Visit: Payer: Self-pay | Admitting: Internal Medicine

## 2020-07-21 DIAGNOSIS — R450 Nervousness: Secondary | ICD-10-CM

## 2020-07-21 MED ORDER — ALPRAZOLAM 1 MG PO TABS: ORAL_TABLET | ORAL | 0 refills | Status: DC

## 2020-07-22 DIAGNOSIS — M722 Plantar fascial fibromatosis: Secondary | ICD-10-CM | POA: Diagnosis not present

## 2020-08-10 ENCOUNTER — Other Ambulatory Visit: Payer: Self-pay | Admitting: Internal Medicine

## 2020-08-10 DIAGNOSIS — I10 Essential (primary) hypertension: Secondary | ICD-10-CM

## 2020-08-18 ENCOUNTER — Ambulatory Visit
Admission: RE | Admit: 2020-08-18 | Discharge: 2020-08-18 | Disposition: A | Discharge status: Home / Self Care | Payer: Medicare PPO | Source: Ambulatory Visit | Attending: Internal Medicine | Admitting: Internal Medicine

## 2020-08-18 ENCOUNTER — Other Ambulatory Visit: Payer: Self-pay

## 2020-08-18 DIAGNOSIS — Z1231 Encounter for screening mammogram for malignant neoplasm of breast: Secondary | ICD-10-CM

## 2020-09-09 ENCOUNTER — Ambulatory Visit: Payer: Medicare PPO | Admitting: Adult Health Nurse Practitioner

## 2020-09-10 ENCOUNTER — Other Ambulatory Visit: Payer: Self-pay | Admitting: Internal Medicine

## 2020-10-07 ENCOUNTER — Ambulatory Visit (INDEPENDENT_AMBULATORY_CARE_PROVIDER_SITE_OTHER): Payer: Medicare PPO

## 2020-10-07 DIAGNOSIS — I442 Atrioventricular block, complete: Secondary | ICD-10-CM

## 2020-10-07 LAB — CUP PACEART REMOTE DEVICE CHECK
Battery Remaining Longevity: 88 mo
Battery Remaining Percentage: 72 %
Battery Voltage: 2.92 V
Brady Statistic AP VP Percent: 95 %
Brady Statistic AP VS Percent: 1 %
Brady Statistic AS VP Percent: 4.7 %
Brady Statistic AS VS Percent: 1 %
Brady Statistic RA Percent Paced: 95 %
Brady Statistic RV Percent Paced: 99 %
Date Time Interrogation Session: 20211201020015
Implantable Lead Implant Date: 20140805
Implantable Lead Implant Date: 20140805
Implantable Lead Location: 753859
Implantable Lead Location: 753860
Implantable Lead Model: 1944
Implantable Lead Model: 1948
Implantable Pulse Generator Implant Date: 20140805
Lead Channel Impedance Value: 540 Ohm
Lead Channel Impedance Value: 610 Ohm
Lead Channel Pacing Threshold Amplitude: 0.5 V
Lead Channel Pacing Threshold Amplitude: 0.75 V
Lead Channel Pacing Threshold Pulse Width: 0.4 ms
Lead Channel Pacing Threshold Pulse Width: 0.4 ms
Lead Channel Sensing Intrinsic Amplitude: 12 mV
Lead Channel Sensing Intrinsic Amplitude: 3.2 mV
Lead Channel Setting Pacing Amplitude: 1 V
Lead Channel Setting Pacing Amplitude: 2 V
Lead Channel Setting Pacing Pulse Width: 0.4 ms
Lead Channel Setting Sensing Sensitivity: 4 mV
Pulse Gen Model: 2240
Pulse Gen Serial Number: 7523768

## 2020-10-12 ENCOUNTER — Other Ambulatory Visit: Payer: Self-pay | Admitting: Internal Medicine

## 2020-10-15 NOTE — Progress Notes (Signed)
Remote pacemaker transmission.   

## 2020-10-18 ENCOUNTER — Other Ambulatory Visit: Payer: Self-pay | Admitting: Internal Medicine

## 2020-10-18 DIAGNOSIS — R450 Nervousness: Secondary | ICD-10-CM

## 2020-10-18 MED ORDER — ALPRAZOLAM 1 MG PO TABS: ORAL_TABLET | ORAL | 0 refills | Status: DC

## 2020-10-20 ENCOUNTER — Telehealth: Payer: Self-pay | Admitting: Internal Medicine

## 2020-10-20 NOTE — Telephone Encounter (Signed)
Spoke with pt.  Assisted with sending manual transmission.    Transmission reviewed, no arrhythmias noted.     Presenting Rhythm  SR79 bpm.  Normal device function.   No episodes recorded recently.

## 2020-10-20 NOTE — Telephone Encounter (Signed)
The patient reports she "just hasn't felt right" for several days.  On Saturday, she had an episode where she felt like she was going to pass out. She lay down for a while and when she got up the feeling was gone. She has been weak ever since. She also complains of intermittent chest discomfort and fatigue for several weeks. She states her discomfort is in the middle of her chest and feel like "hot coffee is running through" her heart. Sometimes she gets hot and sweaty. While she reports this only happens after she eats and when sitting/lying, she also said later she has a stabbing in the back of her heart sometimes when she walks.  She said she had a "slight episode" last night, took ASA 81 mg and it made her feel better.  She thinks she may have forgotten to take her metoprolol Friday/Saturday and her BP was elevated (160s/100), but she knows she took her medications as directed since and has felt a little better. When she last took her HR it was 72 bpm.  She is currently not experiencing any symptoms other than fatigue and a little weakness. She will send a remote transmission for review to see what occurred over the weekend.  She was grateful for assistance.

## 2020-10-20 NOTE — Telephone Encounter (Signed)
Pt c/o of Chest Pain: STAT if CP now or developed within 24 hours  1. Are you having CP right now? Some, not as bad  2. Are you experiencing any other symptoms (ex. SOB, nausea, vomiting, sweating)? Headache, hot sweating, fatigue  3. How long have you been experiencing CP? Couple of weeks  4. Is your CP continuous or coming and going? Comes and goes  5. Have you taken Nitroglycerin? No   Patient states she has been having chest discomfort for the last couple of weeks and is having it now. She states she also had an episode Saturday where felt like she was going to pass out and had to lay down.  ?

## 2020-10-20 NOTE — Telephone Encounter (Signed)
Will forward for advisement from Dr. Johney Frame or Francis Dowse.

## 2020-10-21 ENCOUNTER — Encounter: Payer: Self-pay | Admitting: Adult Health Nurse Practitioner

## 2020-10-21 ENCOUNTER — Ambulatory Visit: Payer: Medicare PPO | Admitting: Adult Health Nurse Practitioner

## 2020-10-21 ENCOUNTER — Other Ambulatory Visit: Payer: Self-pay

## 2020-10-21 VITALS — BP 126/84 | HR 60 | Temp 97.2°F | Ht 64.0 in | Wt 165.0 lb

## 2020-10-21 DIAGNOSIS — F330 Major depressive disorder, recurrent, mild: Secondary | ICD-10-CM | POA: Diagnosis not present

## 2020-10-21 DIAGNOSIS — I10 Essential (primary) hypertension: Secondary | ICD-10-CM | POA: Diagnosis not present

## 2020-10-21 DIAGNOSIS — E782 Mixed hyperlipidemia: Secondary | ICD-10-CM | POA: Diagnosis not present

## 2020-10-21 DIAGNOSIS — Z950 Presence of cardiac pacemaker: Secondary | ICD-10-CM

## 2020-10-21 NOTE — Patient Instructions (Signed)
°  Try Excedrin Migraine for the headache.   Check your blood pressure and pulse twice a day and record.  Bring this with you to the next appointment.  Also check your blood pressure when you are feeling bad also. Consider taking half of Hydrochlorothiazide for swelling.  Notice if you are dizzy on the days you take this.  Please contact office if you have any new or worsening symptoms, blood pressure.  Be sure you are drinking 4-6 bottles of water a day.  Be sure that your are getting protein in the small snacks that you consume.

## 2020-10-21 NOTE — Telephone Encounter (Signed)
Per MD Allred. Bring patient in to see general cardiology this week or next since transmission showed no arrhythmias or episodes. First available 10/26/20 at 2:45 pm with Tereso Newcomer.  ED precautions given. Of note patient is seeing her PCP today. Advised her to discuss symptoms with them as well.  Verbalized understanding.

## 2020-10-21 NOTE — Progress Notes (Signed)
Assessment and Plan:  Alexandra Wheeler was seen today for hypertension.  Diagnoses and all orders for this visit:  Primary hypertension Continue current medications: Metoprolol 100mg  daily HCTZ PRN-cause of dizziness? Happening days she takes this. Monitor blood pressure at home BID and record, discussed goal call if consistently over 160/90 Medication compliance? Continue DASH diet.   Reminder to go to the ER if any CP, SOB, nausea, dizziness, severe HA, changes vision/speech, left arm numbness and tingling and jaw pain. -     CBC with Differential/Platelet -     TSH -     COMPLETE METABOLIC PANEL WITH GFR MetorprollXL 100mg   Pacemaker Follows with cardiology  Hyperlipidemia, mixed Continue medications:Rosuvastatin 40mg  Discussed dietary and exercise modifications Low fat diet  Major depressive disorder, recurrent episode, mild with anxious distress (HCC) Continue medications: duloxetine 30mg  Discussed stress management techniques  Discussed, increase water,intake & good sleep hygiene  Discussed increasing exercise & vegetables in diet   Discussed cause HCTZ 25mg  of dizziness related to occurrence when taking.  Check B/P IF feeling bad or dizzy to correlate symptoms, hypotension? Sounds like rebound HTN from not taking Metoprolol related to this?  Call or return with any new or worsening symptoms.  Also has follow up with cardiology in 5 days.  Further disposition pending results of labs. Discussed med's effects and SE's.   Over 20 minutes of face to face interview, exam, counseling, chart review, and critical decision making was performed.   Future Appointments  Date Time Provider Department Center  10/26/2020  2:45 PM , [facility]  01/06/2021  8:10 AM [facility] DEVICE REMOTES [facility]  01/08/2021 10:00 AM 10/28/2020, MD [facility] None  04/07/2021  8:10 AM [facility] DEVICE REMOTES [facility]  07/07/2021  8:10  AM [facility] DEVICE REMOTES [facility]  10/06/2021  8:10 AM [facility] DEVICE REMOTES [facility]  01/05/2022  8:10 AM [facility] DEVICE REMOTES [facility]  04/06/2022  8:10 AM [facility] DEVICE REMOTES [facility]    ------------------------------------------------------------------------------------------------------------------   HPI 79 y.o.female presents for evaluation of blood pressure.  She reports a week ago she notice her blood pressure was 140/99.  She reports that she had had some dizzy spells and felt like passing out. She did not check her blood pressure at that time.  That morning (five days ago) she has taken and HCTZ 25mg  for BLE.  That night she did not take her metoprolol as she was concerned her  Blood pressure was increasing.  When her B/P was  160's/101 then she called to make an appointment for evaluation.  She reports she has had a persistent headache.  She has tried tylenol and ibuprofen that has not resolved the headache.    Report she typically just snack throughout the day.  She drinks about 4-5 bottles (12oz) of water a day.  She does have a 07/09/2021 She takes HCTZ 25mg  daily.  She also takes meteprololXL 100mg .  She also report that she is having an increase in pain in joints, generalized all over in her bones, nonspeciffic.    Past Medical History:  Diagnosis Date  . Anxiety   . Atrophic vaginitis   . Chest pain    a. Adenosine sestamibi (1/14) with no evidence of ischemia or infarction, EF 63%.   . Colon polyps   . Complete heart block (HCC) 06-2013   status post pacemaker implantation by Dr 03/07/2022 06-11-2013  . Depression   . DI (detrusor instability)   . GERD (  gastroesophageal reflux disease)    History + Hyplori via EGD  . H/O echocardiogram    a. Echo 11/2012:EF 50-55%, focal basal septal hypertrophy with some mitral valve SAM (mild) and MR.  This may be a sigmoid septum with advanced age or could  be a variant of hypertrophic cardiomyopathy.  Marland Kitchen HTN (hypertension)   . Hyperlipidemia   . LBBB (left bundle branch block)   . Leukopenia 05/19/2014  . Lymphocytosis 05/19/2014  . Migraines   . Osteoporosis   . Paroxysmal atrial fibrillation (HCC)   . Prediabetes   . Vitamin D deficiency      Allergies  Allergen Reactions  . Levsin [Hyoscyamine Sulfate]     unknown  . Mobic [Meloxicam]     unknown   . Topamax [Topiramate]     unknown   . Zoloft [Sertraline Hcl]     unknown   . Actonel [Risedronate Sodium] Other (See Comments)    Pt didn't like the way it made her feel   . Boniva [Ibandronic Acid] Other (See Comments)    Pt didn't like the way it made her feel  . Evista [Raloxifene] Other (See Comments)    Pt didn't like the way it made her feel  . Fosamax [Alendronate Sodium] Other (See Comments)    Pt didn't like the way it made her feel     Current Outpatient Medications on File Prior to Visit  Medication Sig  . ALPRAZolam (XANAX) 1 MG tablet Take      1/2 - 1 tablet     at Bedtime        ONLY       if needed for sleep &  Try limit to 5 days /week to avoid addiction  . Ascorbic Acid (VITAMIN C PO) Take 1 tablet by mouth daily.   . Calcium Carbonate-Vitamin D (CALCIUM + D PO) Take 1 tablet by mouth daily.   . Cholecalciferol (VITAMIN D PO) Take 5,000 Units by mouth 2 (two) times a day.   . citalopram (CELEXA) 20 MG tablet TAKE 1 TABLET EVERY DAY FOR MOOD  . fluticasone (FLONASE) 50 MCG/ACT nasal spray Place 2 sprays into both nostrils daily. (Patient taking differently: Place 2 sprays into both nostrils daily as needed for allergies or rhinitis.)  . FOLIC ACID PO Take 1 tablet by mouth daily.   . hydrochlorothiazide (HYDRODIURIL) 25 MG tablet Take 25 mg by mouth daily as needed (swelling).   . hydrocortisone (PROCTOZONE-HC) 2.5 % rectal cream PLACE 1 APPLICATION RECTALLY 2 (TWO) TIMES DAILY.  Marland Kitchen ibuprofen (IBU) 600 MG tablet TAKE 1 TABLET THREE TIMES DAILY WITH FOOD FOR  INFLAMMATION AND PAIN  . MAGNESIUM PO Take 500 mg by mouth daily.   . metoprolol succinate (TOPROL-XL) 100 MG 24 hr tablet TAKE 1 TABLET DAILY FOR BLOOD PRESSURE AND HEART  . Multiple Vitamin (MULTIVITAMIN) tablet Take 1 tablet by mouth daily.  . pantoprazole (PROTONIX) 40 MG tablet Take 1 tablet Daily with Supper for Indigestion & Acid Reflux  . Tretinoin, Facial Wrinkles, 0.05 % CREA APPLY TO AFFECTED AREA EVERY DAY AS DIRECTED   No current facility-administered medications on file prior to visit.    ROS: all negative except above.   Physical Exam:  BP 126/84   Temp (!) 97.2 F (36.2 C)   Wt 165 lb (74.8 kg)   BMI 28.32 kg/m   General Appearance: Well nourished, in no apparent distress. Eyes: PERRLA, EOMs, conjunctiva no swelling or erythema Sinuses: No Frontal/maxillary tenderness  ENT/Mouth: Ext aud canals clear, TMs without erythema, bulging. No erythema, swelling, or exudate on post pharynx.  Tonsils not swollen or erythematous. Hearing normal.  Neck: Supple, thyroid normal.  Respiratory: Respiratory effort normal, BS equal bilaterally without rales, rhonchi, wheezing or stridor.  Cardio: RRR with no MRGs. Brisk peripheral pulses without edema.  Abdomen: Soft, + BS.  Non tender, no guarding, rebound, hernias, masses. Lymphatics: Non tender without lymphadenopathy.  Musculoskeletal: Full ROM, 5/5 strength, normal gait.  Skin: Warm, dry without rashes, lesions, ecchymosis.  Neuro: Cranial nerves intact. Normal muscle tone, no cerebellar symptoms. Sensation intact.  Psych: Awake and oriented X 3, normal affect, Insight and Judgment appropriate.     Elder Negus, NP 3:22 PM [facility]

## 2020-10-22 LAB — CBC WITH DIFFERENTIAL/PLATELET
Absolute Monocytes: 423 cells/uL (ref 200–950)
Basophils Absolute: 41 cells/uL (ref 0–200)
Basophils Relative: 0.9 %
Eosinophils Absolute: 90 cells/uL (ref 15–500)
Eosinophils Relative: 2 %
HCT: 40.7 % (ref 35.0–45.0)
Hemoglobin: 13.1 g/dL (ref 11.7–15.5)
Lymphs Abs: 1994 cells/uL (ref 850–3900)
MCH: 28.4 pg (ref 27.0–33.0)
MCHC: 32.2 g/dL (ref 32.0–36.0)
MCV: 88.1 fL (ref 80.0–100.0)
MPV: 10.7 fL (ref 7.5–12.5)
Monocytes Relative: 9.4 %
Neutro Abs: 1953 cells/uL (ref 1500–7800)
Neutrophils Relative %: 43.4 %
Platelets: 197 10*3/uL (ref 140–400)
RBC: 4.62 10*6/uL (ref 3.80–5.10)
RDW: 12.3 % (ref 11.0–15.0)
Total Lymphocyte: 44.3 %
WBC: 4.5 10*3/uL (ref 3.8–10.8)

## 2020-10-22 LAB — TSH: TSH: 0.75 mIU/L (ref 0.40–4.50)

## 2020-10-22 LAB — COMPLETE METABOLIC PANEL WITH GFR
AG Ratio: 1.8 (calc) (ref 1.0–2.5)
ALT: 13 U/L (ref 6–29)
AST: 15 U/L (ref 10–35)
Albumin: 4.2 g/dL (ref 3.6–5.1)
Alkaline phosphatase (APISO): 88 U/L (ref 37–153)
BUN: 13 mg/dL (ref 7–25)
CO2: 29 mmol/L (ref 20–32)
Calcium: 9.5 mg/dL (ref 8.6–10.4)
Chloride: 106 mmol/L (ref 98–110)
Creat: 0.8 mg/dL (ref 0.60–0.88)
GFR, Est African American: 81 mL/min/{1.73_m2} (ref >=60)
GFR, Est Non African American: 70 mL/min/{1.73_m2} (ref >=60)
Globulin: 2.3 g/dL (calc) (ref 1.9–3.7)
Glucose, Bld: 89 mg/dL (ref 65–99)
Potassium: 4.9 mmol/L (ref 3.5–5.3)
Sodium: 142 mmol/L (ref 135–146)
Total Bilirubin: 0.4 mg/dL (ref 0.2–1.2)
Total Protein: 6.5 g/dL (ref 6.1–8.1)

## 2020-10-26 ENCOUNTER — Other Ambulatory Visit: Payer: Self-pay

## 2020-10-26 ENCOUNTER — Ambulatory Visit: Payer: Medicare PPO | Admitting: Physician Assistant

## 2020-10-26 ENCOUNTER — Encounter: Payer: Self-pay | Admitting: Physician Assistant

## 2020-10-26 VITALS — BP 120/84 | HR 60 | Ht 64.0 in | Wt 169.4 lb

## 2020-10-26 DIAGNOSIS — I480 Paroxysmal atrial fibrillation: Secondary | ICD-10-CM | POA: Diagnosis not present

## 2020-10-26 DIAGNOSIS — I10 Essential (primary) hypertension: Secondary | ICD-10-CM | POA: Diagnosis not present

## 2020-10-26 DIAGNOSIS — Z950 Presence of cardiac pacemaker: Secondary | ICD-10-CM | POA: Diagnosis not present

## 2020-10-26 DIAGNOSIS — I442 Atrioventricular block, complete: Secondary | ICD-10-CM | POA: Diagnosis not present

## 2020-10-26 DIAGNOSIS — I421 Obstructive hypertrophic cardiomyopathy: Secondary | ICD-10-CM | POA: Diagnosis not present

## 2020-10-26 DIAGNOSIS — R072 Precordial pain: Secondary | ICD-10-CM

## 2020-10-26 NOTE — Patient Instructions (Signed)
Medication Instructions:  Your physician recommends that you continue on your current medications as directed. Please refer to the Current Medication list given to you today.  *If you need a refill on your cardiac medications before your next appointment, please call your pharmacy*  Lab Work: None ordered today  Testing/Procedures: Your physician has requested that you have an echocardiogram. Echocardiography is a painless test that uses sound waves to create images of your heart. It provides your doctor with information about the size and shape of your heart and how well your heart's chambers and valves are working. This procedure takes approximately one hour. There are no restrictions for this procedure.  Your physician has requested that you have a lexiscan myoview. For further information please visit [email or web address] Please follow instruction sheet, as given.  Follow-Up: At [facility], you and your health needs are our priority.  As part of our continuing mission to provide you with exceptional heart care, we have created designated Provider Care Teams.  These Care Teams include your primary Cardiologist (physician) and Advanced Practice Providers (APPs -  Physician Assistants and Nurse Practitioners) who all work together to provide you with the care you need, when you need it.  Your next appointment:   3 month(s)  The format for your next appointment:   In Person  Provider:   Laurance Flatten, MD  Other Instructions Get a new blood pressure cuff. If you have to take HCTZ for swelling, only take 0.5 tablet.

## 2020-10-26 NOTE — Progress Notes (Signed)
Cardiology Office Note:    Date:  10/26/2020   ID:  Jerrye Beaversudrey McNeill Sitter, DOB 1940/12/30, MRN [id number]  PCP:  Lucky CowboyMcKeown, William, MD  [facility] Cardiologist:  Meriam SpragueHeather E Pemberton, MD / Tereso NewcomerScott Lelon Ikard, PA-C  [facility] Electrophysiologist:  Hillis RangeJames Allred, MD   Referring MD: Lucky CowboyMcKeown, William, MD   Chief Complaint:  Dizziness and Chest Pain    Patient Profile:    Alexandra Wheeler is a 79 y.o. female with:   Paroxysmal atrial fibrillation  Apixaban started in 10/2018 (AFib clinic)   Apixaban DC'd in 08/2019: 0% AF on PPM interrogation   CHB, s/p pacemaker in 06/2013  Hypertension   Hyperlipidemia   LBBB  GERD  Coronary artery disease   Myoview 1/14: low risk  Cath 8/14: Minimal plaque  Hypertrophic CM  Echocardiogram in 2014: asymmetric septal hypertrophy, MV SAM, mild MR, no sig LVOT at rest; LVOT 15 mmHg)  beta-blocker initiated in 2014  No MRI due to PPM  ETT 11/16: no high risk features   Echocardiogram 5/18: EF 60-65, Gr 1 DD, findings c/w HCM  Prior CV studies: Echocardiogram 03/10/2017 Severe focal basal septal hypertrophy, moderate hypertrophy of the myocardium, EF 60-65, normal wall motion, GR 1 DD, mild AI, trivial MR, normal RVSF, moderate TR, PASP 38  ETT 09/2015 Normal BP response, PVCs, no NSVT; low risk study  Echocardiogram 07/31/2015 Moderate asymmetric septal hypertrophy, EF 55-60, normal wall motion, G1 DD, mild AI, moderate S.A.M., moderate MR, moderate TR, peak LVOT gradient 9 mmHg  Cardiac catheterization 06/11/2013 LM ostial 10 LAD mid mild irregularities EF 50-55  Myoview 11/26/2012 EF 63, no ischemia  History of Present Illness:    Alexandra Wheeler was previously followed by Dr. Shirlee LatchMcLean and was last seen by him in the CHF Clinic in 2018.  She has a hx of HCM with asymmetric septal hypertrophy, MV systolic anterior motion and no significant LVOT gradient at rest.  She had a low risk ETT in 2016.  Her last echocardiogram  was in 2018 and was c/w HCM.  She saw Dr. Johney FrameAllred in 12/2018 and was last seen by EP in 12/2019 by Francis Dowseenee Ursuy, PA-C.  Of note, her Apixaban was DC'd in 08/2019 due to low burden of AF on her pacer.    She called in recently with chest pain and near syncope.  Her device was interrogated and no abnormalities were detected.  She is seen for further evaluation.  She is here alone.  Last week, she checked her blood pressure and it was significantly elevated.  She took an HCTZ 25 mg and an extra half of metoprolol succinate.  The next day, she felt near syncopal while getting her hair done.  She drank significant amounts of water and laid down.  She has not had a recurrence since that time.  Her blood pressure cuff at home tends to run high.  When she comes to her PCPs office or our office, her blood pressure is optimal.  She does note dyspnea with exertion.  She can get to the top of her stairs without stopping.  She has not had orthopnea, PND or significant leg swelling.  She does have occasional interscapular back pain which radiates to her chest.  This does not seem to be related to exertion.  She has not had frank syncope.      Past Medical History:  Diagnosis Date  . Anxiety   . Atrophic vaginitis   . Chest pain    a. Adenosine sestamibi (  1/14) with no evidence of ischemia or infarction, EF 63%.   . Colon polyps   . Complete heart block (HCC) 06-2013   status post pacemaker implantation by Dr Johney Frame 06-11-2013  . Depression   . DI (detrusor instability)   . GERD (gastroesophageal reflux disease)    History + Hyplori via EGD  . H/O echocardiogram    a. Echo 11/2012:EF 50-55%, focal basal septal hypertrophy with some mitral valve SAM (mild) and MR.  This may be a sigmoid septum with advanced age or could be a variant of hypertrophic cardiomyopathy.  Marland Kitchen HTN (hypertension)   . Hyperlipidemia   . LBBB (left bundle branch block)   . Leukopenia 05/19/2014  . Lymphocytosis 05/19/2014  . Migraines   .  Osteoporosis   . Paroxysmal atrial fibrillation (HCC)   . Prediabetes   . Vitamin D deficiency     Current Medications: Current Meds  Medication Sig  . acetaminophen (TYLENOL) 500 MG tablet Take 1,000 mg by mouth in the morning and at bedtime.  . ALPRAZolam (XANAX) 1 MG tablet Take      1/2 - 1 tablet     at Bedtime        ONLY       if needed for sleep &  Try limit to 5 days /week to avoid addiction  . Ascorbic Acid (VITAMIN C PO) Take 1 tablet by mouth daily.   . Calcium Carbonate-Vitamin D (CALCIUM + D PO) Take 1 tablet by mouth daily.   . Cholecalciferol (VITAMIN D PO) Take 5,000 Units by mouth 2 (two) times a day.   . citalopram (CELEXA) 20 MG tablet TAKE 1 TABLET EVERY DAY FOR MOOD  . fluticasone (FLONASE) 50 MCG/ACT nasal spray Place 2 sprays into both nostrils daily as needed for allergies or rhinitis.  Marland Kitchen FOLIC ACID PO Take 1 tablet by mouth daily.   . hydrochlorothiazide (HYDRODIURIL) 25 MG tablet Take 25 mg by mouth daily as needed (swelling).   . hydrocortisone (PROCTOZONE-HC) 2.5 % rectal cream PLACE 1 APPLICATION RECTALLY 2 (TWO) TIMES DAILY.  Marland Kitchen ibuprofen (ADVIL) 600 MG tablet Take 600 mg by mouth in the morning and at bedtime.  Marland Kitchen MAGNESIUM PO Take 500 mg by mouth daily.   . metoprolol succinate (TOPROL-XL) 100 MG 24 hr tablet TAKE 1 TABLET DAILY FOR BLOOD PRESSURE AND HEART  . Multiple Vitamin (MULTIVITAMIN) tablet Take 1 tablet by mouth daily.  . pantoprazole (PROTONIX) 40 MG tablet Take 1 tablet Daily with Supper for Indigestion & Acid Reflux  . pantoprazole (PROTONIX) 40 MG tablet Take 40 mg by mouth as needed (for indigestion /acid reflux).  . Tretinoin, Facial Wrinkles, 0.05 % CREA APPLY TO AFFECTED AREA EVERY DAY AS DIRECTED     Allergies:   Levsin [hyoscyamine sulfate], Mobic [meloxicam], Topamax [topiramate], Zoloft [sertraline hcl], Actonel [risedronate sodium], Boniva [ibandronic acid], Evista [raloxifene], and Fosamax [alendronate sodium]   Social History    Tobacco Use  . Smoking status: Former Smoker    Types: Cigarettes    Quit date: 10/09/1975    Years since quitting: 45.0  . Smokeless tobacco: Never Used  Vaping Use  . Vaping Use: Never used  Substance Use Topics  . Alcohol use: No  . Drug use: No     Family Hx: The patient's family history includes Aneurysm in her brother; Breast cancer (age of onset: 2) in her sister; Cancer in her father; Diabetes in her brother, brother, and mother; Heart disease in her brother, brother,  and mother; Hypertension in her brother; Stroke in her brother.  Review of Systems  Constitutional: Positive for chills. Negative for fever.  Respiratory: Positive for cough. Negative for hemoptysis.   Gastrointestinal: Negative for hematochezia and melena.  Genitourinary: Negative for hematuria.     EKGs/Labs/Other Test Reviewed:    EKG:  EKG is   ordered today.  The ekg ordered today demonstrates AV paced, HR 60  Recent Labs: 06/08/2020: Magnesium 2.3 10/21/2020: ALT 13; BUN 13; Creat 0.80; Hemoglobin 13.1; Platelets 197; Potassium 4.9; Sodium 142; TSH 0.75   Recent Lipid Panel Lab Results  Component Value Date/Time   CHOL 200 (H) 06/08/2020 11:09 AM   TRIG 106 06/08/2020 11:09 AM   HDL 52 06/08/2020 11:09 AM   CHOLHDL 3.8 06/08/2020 11:09 AM   LDLCALC 127 (H) 06/08/2020 11:09 AM      Risk Assessment/Calculations:     CHA2DS2-VASc Score = 5  This indicates a 7.2% annual risk of stroke. The patient's score is based upon: CHF History: Yes (structural heart disease (HOCM)) HTN History: Yes Diabetes History: No Stroke History: No Vascular Disease History: No Age Score: 2 Gender Score: 1     Physical Exam:    VS:  BP 120/84   Pulse 60   Ht 5\' 4"  (1.626 m)   Wt 169 lb 6.4 oz (76.8 kg)   SpO2 94%   BMI 29.08 kg/m     Wt Readings from Last 3 Encounters:  10/26/20 169 lb 6.4 oz (76.8 kg)  10/21/20 165 lb (74.8 kg)  06/08/20 166 lb 3.2 oz (75.4 kg)     Constitutional:       Appearance: Healthy appearance. Not in distress.  Neck:     Vascular: No JVR. JVD normal.  Pulmonary:     Effort: Pulmonary effort is normal.     Breath sounds: No wheezing. No rales.  Cardiovascular:     Normal rate. Regular rhythm. Normal S1. Normal S2.     Murmurs: There is a systolic murmur at the URSB. The intensity increases with Valsalva.  Edema:    Peripheral edema absent.  Abdominal:     Palpations: Abdomen is soft. There is no hepatomegaly.  Skin:    General: Skin is warm and dry.  Neurological:     Mental Status: Alert and oriented to person, place and time.     Cranial Nerves: Cranial nerves are intact.       ASSESSMENT & PLAN:    1. Precordial pain Cardiac catheterization in 2014 demonstrated minimal plaque.  Recently, she has had interscapular back pain radiating to her chest.  This does not seem to be related to exertion.  Her ECG is paced.  Since it has been several years since her last assessment for ischemia, I recommended proceeding with a Lexiscan Myoview.  2. Hypertrophic obstructive cardiomyopathy (HCC) She had no significant LVOT obstruction at rest by echocardiogram in the past.  She had a low risk ETT in 2016.  She has not had a cardiac MRI due to her pacemaker.  Her last echocardiogram was in May 2018.  She has noted some shortness of breath with going up steps.  She recently had near syncope but this seems to have been related to overdiuresis with hydrochlorothiazide.  I have recommended proceeding with an echocardiogram to follow-up on her hypertrophic cardiomyopathy.  Of note, she used to follow with Dr. June 2018 prior to his move to the heart failure clinic.  She prefers to remain at this office.  I will continue to follow her along with Dr. Shari Prows for general cardiology.  3. CHB (complete heart block) (HCC) 4. Cardiac pacemaker in situ Continue follow-up with Dr. Johney Frame for EP.  5. Essential hypertension Blood pressure is well controlled.  I suspect  her blood pressure machine is inaccurate at home.  She took extra medication when her pressure was reading high.  She then had a near syncopal episode.  It sounds like she dropped her BP too low.  There were no arrhythmias on her device interrogation.  I have advised her to get a new blood pressure cuff.  I have also advised her to use half dose of HCTZ if she feels she needs this for edema in the future.  6. Paroxysmal atrial fibrillation (HCC) No apparent recurrence.  She has been taken off of anticoagulation due to low burden.    Shared Decision Making/Informed Consent The risks [chest pain, shortness of breath, cardiac arrhythmias, dizziness, blood pressure fluctuations, myocardial infarction, stroke/transient ischemic attack, nausea, vomiting, allergic reaction, radiation exposure, metallic taste sensation and life-threatening complications (estimated to be 1 in 10,000)], benefits (risk stratification, diagnosing coronary artery disease, treatment guidance) and alternatives of a nuclear stress test were discussed in detail with Ms. Lindon and she agrees to proceed.   Dispo:  Return in about 3 months (around 01/24/2021) for Routine Follow Up, w/ Dr. Shari Prows, or Tereso Newcomer, PA-C, in person.   Medication Adjustments/Labs and Tests Ordered: Current medicines are reviewed at length with the patient today.  Concerns regarding medicines are outlined above.  Tests Ordered: Orders Placed This Encounter  Procedures  . Cardiac Stress Test: Informed Consent Details: Physician/Practitioner Attestation; Transcribe to consent form and obtain patient signature  . MYOCARDIAL PERFUSION IMAGING  . EKG 12-Lead  . ECHOCARDIOGRAM COMPLETE   Medication Changes: No orders of the defined types were placed in this encounter.   Signed, Tereso Newcomer, PA-C  10/26/2020 4:35 PM    [facility] [address] [place], [place], [place]  [address] Phone: [phone]; Fax: [phone]

## 2020-10-26 NOTE — Progress Notes (Signed)
PATIENT IS AWARE OF LAB RESULTS AND STATES THAT HER BLOOD PRESSURE HAS BEEN NORMAL SINCE SHE WAS IN OUR OFFICE FOR HER APPT. Alexandra Wheeler

## 2020-11-03 ENCOUNTER — Telehealth (HOSPITAL_COMMUNITY): Payer: Self-pay | Admitting: *Deleted

## 2020-11-03 NOTE — Telephone Encounter (Signed)
Left message on voicemail in reference to upcoming appointment scheduled for 11/05/20. Phone number given for a call back so details instructions can be given. Alexandra Wheeler, Alexandra Wheeler No mychart availble

## 2020-11-04 ENCOUNTER — Telehealth: Payer: Self-pay | Admitting: Internal Medicine

## 2020-11-04 NOTE — Telephone Encounter (Signed)
Patient is returning a call from [place], regarding instructions for her appointment tomorrow. Please advise.

## 2020-11-05 ENCOUNTER — Ambulatory Visit (HOSPITAL_COMMUNITY): Payer: Medicare PPO | Attending: Internal Medicine

## 2020-11-05 ENCOUNTER — Other Ambulatory Visit: Payer: Self-pay

## 2020-11-05 ENCOUNTER — Encounter: Payer: Self-pay | Admitting: Physician Assistant

## 2020-11-05 DIAGNOSIS — R072 Precordial pain: Secondary | ICD-10-CM

## 2020-11-05 LAB — MYOCARDIAL PERFUSION IMAGING
LV dias vol: 69 mL (ref 46–106)
LV sys vol: 22 mL
Peak HR: 75 {beats}/min
Rest HR: 60 {beats}/min
SDS: 1
SRS: 0
SSS: 1
TID: 1.02

## 2020-11-05 MED ORDER — TECHNETIUM TC 99M TETROFOSMIN IV KIT
31.1000 | PACK | Freq: Once | INTRAVENOUS | Status: AC | PRN
  Administered 2020-11-05: 31.1 via INTRAVENOUS
  Filled 2020-11-05: qty 32

## 2020-11-05 MED ORDER — REGADENOSON 0.4 MG/5ML IV SOLN
0.4000 mg | Freq: Once | INTRAVENOUS | Status: AC
  Administered 2020-11-05: 0.4 mg via INTRAVENOUS

## 2020-11-05 MED ORDER — TECHNETIUM TC 99M TETROFOSMIN IV KIT
9.8000 | PACK | Freq: Once | INTRAVENOUS | Status: AC | PRN
  Administered 2020-11-05: 9.8 via INTRAVENOUS
  Filled 2020-11-05: qty 10

## 2020-11-05 NOTE — Telephone Encounter (Signed)
Pt notified of Stress test results no questions.  Patient hasn't obtained a new BP cuff and hasn't checked BP in over a week.  Expressed that she will use a family members BP cuff until she can get one.  Patient will send BP readings to PCP once obtained.

## 2020-11-05 NOTE — Telephone Encounter (Signed)
-----   Message from Beatrice Lecher, [place] sent at 11/05/2020  3:29 PM EST ----- Stress test is normal.  BP was high prior to start of test. PLAN:   - Continue current medications/treatment plan and follow up as scheduled.   - Ask pt if she got a new BP cuff and what her BP's have been at home.  - Send copy to PCP.  Tereso Newcomer, PA-C    11/05/2020 3:26 PM

## 2020-11-09 ENCOUNTER — Telehealth: Payer: Self-pay

## 2020-11-09 ENCOUNTER — Telehealth: Payer: Self-pay | Admitting: *Deleted

## 2020-11-09 NOTE — Telephone Encounter (Signed)
Merlin Alert for NSVT lasting 36 beats w/ rate 160's bpm.    Pt meds include Metoprolol 100mg daily.   Spoke with pt, at time of episode she believes she was sleeping (0530 on 11/07/20).  She denies any cardiac symptoms upon waking that day or currently.  Pt confirms med compliance as ordered.    Reviewed ED precautions.    Advised pt I would forward to MD for review, anticipate continued monitoring.

## 2020-11-09 NOTE — Telephone Encounter (Signed)
Patient called in regard to her BP readings. At stress test on 11/05/2020, her BP was elevated. She is checking BP and readings 140/97 and 129/86. Per Dr Oneta Rack, the patient can increase her Metoprolol 100 mg to 1.5 tablets daily, check BP and pulse. If blood pressure remains elevated and pule rate is not low, she can increase to Metoprolol 100 mg 2 times daily. Patient is aware and will call back if needed.

## 2020-11-09 NOTE — Telephone Encounter (Signed)
No changes at this time.

## 2020-11-23 ENCOUNTER — Other Ambulatory Visit (HOSPITAL_COMMUNITY): Payer: Medicare PPO

## 2020-12-09 ENCOUNTER — Ambulatory Visit (HOSPITAL_COMMUNITY): Payer: Medicare PPO | Attending: Cardiology

## 2020-12-09 ENCOUNTER — Other Ambulatory Visit: Payer: Self-pay

## 2020-12-09 DIAGNOSIS — I421 Obstructive hypertrophic cardiomyopathy: Secondary | ICD-10-CM | POA: Diagnosis not present

## 2020-12-09 LAB — ECHOCARDIOGRAM COMPLETE
Area-P 1/2: 3.31 cm2
MV M vel: 5.94 m/s
MV Peak grad: 141.1 mmHg
Radius: 0.5 cm
S' Lateral: 2 cm

## 2020-12-10 ENCOUNTER — Encounter: Payer: Self-pay | Admitting: Physician Assistant

## 2020-12-10 ENCOUNTER — Telehealth: Payer: Self-pay

## 2020-12-10 DIAGNOSIS — I422 Other hypertrophic cardiomyopathy: Secondary | ICD-10-CM | POA: Insufficient documentation

## 2020-12-10 DIAGNOSIS — I77810 Thoracic aortic ectasia: Secondary | ICD-10-CM

## 2020-12-10 DIAGNOSIS — I421 Obstructive hypertrophic cardiomyopathy: Secondary | ICD-10-CM | POA: Insufficient documentation

## 2020-12-10 NOTE — Telephone Encounter (Signed)
-----   Message from Beatrice Lecher, [place] sent at 12/10/2020  9:34 AM EST ----- The echocardiogram shows normal EF.  This echocardiogram continues to show abnormal thickness of the interventricular septum, leakage of the mitral and tricuspid valves.  Overall, the findings are similar to the echocardiogram from 2018.  On this study, there is noted enlargement of the ascending aorta. We need to get a CT to accurately size this.    PLAN:  - Please arrange Chest CTA (Dx: dilated thoracic aorta) - Keep f/u with Dr. Shari Prows as scheduled.   Tereso Newcomer, PA-C    12/10/2020 9:14 AM

## 2020-12-10 NOTE — Telephone Encounter (Signed)
RN called and stated following: echocardiogram shows normal EF; echocardiogram continues to show abnormal thickness of the interventricular septum, leakage of the mitral and tricuspid valves.  Overall, the findings are similar to the echocardiogram from 2018.  On this study, there is noted enlargement of the ascending aorta. We need to get a CT to accurately size this.     RN stated that the schedulers would call to schedule the chest CT, and then Dr. Shari Prows would discuss the results on her f/u appointment on 01/25/21 at 10:00 am.   Pt stated she understood the plan and denied any questions or concerns at this time.

## 2020-12-15 ENCOUNTER — Other Ambulatory Visit: Payer: Self-pay

## 2020-12-15 ENCOUNTER — Other Ambulatory Visit: Payer: Medicare PPO

## 2020-12-15 DIAGNOSIS — I77810 Thoracic aortic ectasia: Secondary | ICD-10-CM

## 2020-12-15 LAB — BASIC METABOLIC PANEL
BUN/Creatinine Ratio: 14 (ref 12–28)
BUN: 11 mg/dL (ref 8–27)
CO2: 23 mmol/L (ref 20–29)
Calcium: 9.4 mg/dL (ref 8.7–10.3)
Chloride: 104 mmol/L (ref 96–106)
Creatinine, Ser: 0.77 mg/dL (ref 0.57–1.00)
GFR calc Af Amer: 84 mL/min/{1.73_m2} (ref >59)
GFR calc non Af Amer: 73 mL/min/{1.73_m2} (ref >59)
Glucose: 83 mg/dL (ref 65–99)
Potassium: 4.8 mmol/L (ref 3.5–5.2)
Sodium: 140 mmol/L (ref 134–144)

## 2020-12-16 ENCOUNTER — Encounter: Payer: Self-pay | Admitting: Adult Health Nurse Practitioner

## 2020-12-18 ENCOUNTER — Ambulatory Visit (INDEPENDENT_AMBULATORY_CARE_PROVIDER_SITE_OTHER)
Admission: RE | Admit: 2020-12-18 | Discharge: 2020-12-18 | Disposition: A | Discharge status: Home / Self Care | Payer: Medicare PPO | Source: Ambulatory Visit | Attending: Physician Assistant | Admitting: Physician Assistant

## 2020-12-18 ENCOUNTER — Encounter: Payer: Self-pay | Admitting: Physician Assistant

## 2020-12-18 ENCOUNTER — Other Ambulatory Visit: Payer: Self-pay

## 2020-12-18 ENCOUNTER — Telehealth: Payer: Self-pay

## 2020-12-18 DIAGNOSIS — I712 Thoracic aortic aneurysm, without rupture, unspecified: Secondary | ICD-10-CM

## 2020-12-18 DIAGNOSIS — J8410 Pulmonary fibrosis, unspecified: Secondary | ICD-10-CM | POA: Diagnosis not present

## 2020-12-18 DIAGNOSIS — I77810 Thoracic aortic ectasia: Secondary | ICD-10-CM

## 2020-12-18 DIAGNOSIS — D1809 Hemangioma of other sites: Secondary | ICD-10-CM | POA: Diagnosis not present

## 2020-12-18 DIAGNOSIS — R188 Other ascites: Secondary | ICD-10-CM | POA: Diagnosis not present

## 2020-12-18 MED ORDER — IOHEXOL 350 MG/ML SOLN
75.0000 mL | Freq: Once | INTRAVENOUS | Status: AC | PRN
  Administered 2020-12-18: 75 mL via INTRAVENOUS

## 2020-12-18 NOTE — Telephone Encounter (Signed)
RN called and discussed patient's result:The CT shows a thoracic aortic aneurysm measuring 44 mm.   PLAN:  - Continue current medications  - Do not take fluoroquinolone antibiotics.  - Repeat Chest CTA in 1 year  - Send copy to to PCP   RN explained what  fluoroquinolone antibiotics were and she wrote it down. Stated that a repeat chest cta would be ordered, and to send copy to PCP.  Patient denied any other questions or concerns

## 2021-01-06 ENCOUNTER — Ambulatory Visit (INDEPENDENT_AMBULATORY_CARE_PROVIDER_SITE_OTHER): Payer: Medicare PPO

## 2021-01-06 DIAGNOSIS — I442 Atrioventricular block, complete: Secondary | ICD-10-CM

## 2021-01-07 ENCOUNTER — Encounter: Payer: Self-pay | Admitting: Internal Medicine

## 2021-01-07 DIAGNOSIS — Z87891 Personal history of nicotine dependence: Secondary | ICD-10-CM | POA: Insufficient documentation

## 2021-01-07 DIAGNOSIS — Z8249 Family history of ischemic heart disease and other diseases of the circulatory system: Secondary | ICD-10-CM | POA: Insufficient documentation

## 2021-01-07 NOTE — Patient Instructions (Signed)

## 2021-01-07 NOTE — Progress Notes (Signed)
Annual Screening/Preventative Visit & Comprehensive Evaluation &  Examination      This very nice 80 y.o. WWF presents for a Screening /Preventative Visit & comprehensive evaluation and management of multiple medical co-morbidities.  Patient has been followed for HTN, HLD, Prediabetes  and Vitamin D Deficiency.       HTN predates since 2005 . Recently patient's BP's  had increased & she had been advised to increase her Toprol 100 mg to bid with  subsequent BP  controlled. In 2014, patient presented with asymptomatic CHB and had PPM implanted by Dr Johney Frame.  Myoview in Jan 2014 was Negative.  Ht Cath in Aug 2014 was Negative. ETT in Nov 2016 was Negative.  Then in Dec 2019, she was dx'd with pAfib & started on Eliquis ( later d/c'd in Oct 2020). Patient is followed by Dr Johney Frame for HCM /ASH any Ao Arch Thoracic Aneurysm.  Patient denies any cardiac symptoms as chest pain, palpitations, shortness of breath, dizziness or ankle swelling. Today's BP is at goal - 126/84.       Patient's hyperlipidemia is controlled with diet and medications. Patient denies myalgias or other medication SE's. Last lipids were not at goal:  Lab Results  Component Value Date   CHOL 200 (H) 06/08/2020   HDL 52 06/08/2020   LDLCALC 127 (H) 06/08/2020   TRIG 106 06/08/2020   CHOLHDL 3.8 06/08/2020       Patient has hx/o prediabetes (A1c 5.9% /2011 & 2016) and patient denies reactive hypoglycemic symptoms, visual blurring, diabetic polys or paresthesias. Last A1c was near goal:  Lab Results  Component Value Date   HGBA1C 5.7 (H) 06/08/2020       Finally, patient has history of Vitamin D Deficiency ("34" /2008) and last Vitamin D was at goal:  Lab Results  Component Value Date   VD25OH 84 06/08/2020    Current Outpatient Medications on File Prior to Visit  Medication Sig  . acetaminophen 500 MG tablet Take 1,000 mg  in the morning and at bedtime.  . ALPRAZolam  1 MG tablet Take 1/2 - 1 tablet at Bedtime    . VITAMIN C Take 1 tablet  daily.   . Calcium Carbonate-Vitamin D  Take 1 tablet  daily.   Marland Kitchen VITAMIN D 5,000 Units  Take  2  times a day.   . citalopram  20 MG tablet TAKE 1 TABLET EVERY DAY FOR MOOD  . FLONASE nasal spray Place 2 sprays into nostrils daily as needed   . FOLIC ACID  Take 1 tablet  daily.   . hydrochlorothiazide  25 MG  Take  daily as needed   . PROCTOZONE-HC 2.5 % rec crm PLACE RECTALLY 2  TIMES DAILY.  Marland Kitchen ibuprofen  600 MG tablet Take  in the morning and at bedtime.  Marland Kitchen MAGNESIUM 500 mg   Take daily.   . metoprolol succ-XL 100 MG TAKE 1 TABLET DAILY   . Multiple Vitamin  Take 1 tablet  daily.  . pantoprazole  40 MG tablet Take 1 tablet Daily with Supper   . Tretinoin 0.05 % CREA APPLY TO AFFECTED AREA EVERY DAY      Allergies  Allergen Reactions  . Levsin [Hyoscyamine Sulfate]     unknown  . Mobic [Meloxicam]     unknown  . Topamax [Topiramate]     unknown  . Zoloft [Sertraline Hcl]     unknown  . Actonel [Risedronate Sodium] Other (See Comments)    Pt didn't  like the way it made her feel  . Boniva [Ibandronic Acid] Other (See Comments)    Pt didn't like the way it made her feel  . Evista [Raloxifene] Other (See Comments)    Pt didn't like the way it made her feel  . Fosamax [Alendronate Sodium] Other (See Comments)    Pt didn't like the way it made her feel    Past Medical History:  Diagnosis Date  . Anxiety   . Atrophic vaginitis   . Chest pain    a. Adenosine sestamibi (1/14) with no evidence of ischemia or infarction, EF 63%.   . Colon polyps   . Complete heart block (HCC) 06-2013   status post pacemaker implantation by Dr Johney Frame 06-11-2013  . Depression   . DI (detrusor instability)   . GERD (gastroesophageal reflux disease)    History + Hyplori via EGD  . H/O echocardiogram    a. Echo 11/2012:EF 50-55%, focal basal septal hypertrophy with some mitral valve SAM (mild) and MR.  This may be a sigmoid septum with advanced age or could be a variant of  hypertrophic cardiomyopathy.  Marland Kitchen HOCM (hypertrophic obstructive cardiomyopathy) (HCC)    Echocardiogram 2/22: EF 60-65, no RWMA, severe asymmetric septal LVH, no LVOT, Gr 1 DD, mild SAM, mod MR, mild LAE, mod to severe TR, Asc Ao 42 mm, normal RVSF, RVSP 28  . HTN (hypertension)   . Hyperlipidemia   . LBBB (left bundle branch block)   . Leukopenia 05/19/2014  . Lymphocytosis 05/19/2014  . Migraines   . Myoview    Myoview 12/21: EF 69, no ischemia or infarction; low risk   . Osteoporosis   . Paroxysmal atrial fibrillation (HCC)   . Prediabetes   . Thoracic aortic aneurysm (HCC)    Chest CTA 2/22: Ascending thoracic aortic aneurysm 44 mm; Ao arch 34 mm>>repeat 1 year  . Vitamin D deficiency     Health Maintenance  Topic Date Due  . COVID-19 Vaccine (1) Never done  . INFLUENZA VACCINE  06/07/2020  . TETANUS/TDAP  05/08/2021  . DEXA SCAN  Completed  . PNA vac Low Risk Adult  Completed  . HPV VACCINES  Aged Out    Immunization History  Administered Date(s) Administered  . Fluad Quad(high Dose 65+) 07/25/2019  . Influenza Split 08/06/2014, 09/20/2017  . Influenza, High Dose Seasonal PF 09/01/2015, 08/19/2016  . Influenza-Unspecified 09/12/2018  . Pneumococcal Conjugate-13 09/01/2015  . Pneumococcal-Unspecified 10/08/2010  . Td 05/09/2011  . Zoster 05/09/2011    Last Colon - 11/11/2013 - Dr Rosana Hoes - Recc 5 yr f/u   Last MGM - 08/18/2020  Last Dexa BMD - 07/05/2017 T= -2.7 Spine , Osteoporosis  Past Surgical History:  Procedure Laterality Date  . BREAST BIOPSY Left 07/25/2019   ANGIOLIPOMA  . BREAST EXCISIONAL BIOPSY Right   . BREAST SURGERY     Breast cyst  . CATARACT EXTRACTION Left 2019   Dr. Dione Booze  . HEMORRHOID SURGERY    . LEFT HEART CATHETERIZATION WITH CORONARY ANGIOGRAM N/A 06/11/2013   Procedure: LEFT HEART CATHETERIZATION WITH CORONARY ANGIOGRAM;  Surgeon: Kathleene Hazel, MD;  Location: [facility] CATH LAB;  Service: Cardiovascular;  Laterality: N/A;  .  PACEMAKER INSERTION  06-11-2013   STJ dual chamber pacemaker implanted by Dr Johney Frame for complete heart block  . PERMANENT PACEMAKER INSERTION Left 06/11/2013   Procedure: PERMANENT PACEMAKER INSERTION;  Surgeon: Kathleene Hazel, MD;  Location: [facility] C[facility] LAB;  Service: Cardiovascular;  Laterality: Left;  . PERMANENT  PACEMAKER INSERTION N/A 06/11/2013   Procedure: PERMANENT PACEMAKER INSERTION;  Surgeon: Hillis RangeJames Allred, MD;  [facility];  Service: Cardiovascular;  Laterality: N/A;  . TEMPORARY PACEMAKER INSERTION N/A 06/11/2013   Procedure: TEMPORARY PACEMAKER INSERTION;  Surgeon: Kathleene Hazelhristopher D McAlhany, MD;  [facility];  Service: Cardiovascular;  Laterality: N/A;  . TUBAL LIGATION      Family History  Problem Relation Age of Onset  . Diabetes Mother   . Heart disease Mother   . Diabetes Brother   . Hypertension Brother   . Heart disease Brother   . Breast cancer Sister [id number]  . Aneurysm Brother   . Stroke Brother   . Cancer Father        prostate  . Diabetes Brother   . Heart disease Brother     Social History   Tobacco Use  . Smoking status: Former Smoker    Types: Cigarettes    Quit date: 10/09/1975    Years since quitting: 45.2  . Smokeless tobacco: Never Used  Vaping Use  . Vaping Use: Never used  Substance Use Topics  . Alcohol use: No  . Drug use: No    ROS Constitutional: Denies fever, chills, weight loss/gain, headaches, insomnia,  night sweats, and change in appetite. Does c/o fatigue. Eyes: Denies redness, blurred vision, diplopia, discharge, itchy, watery eyes.  ENT: Denies discharge, congestion, post nasal drip, epistaxis, sore throat, earache, hearing loss, dental pain, Tinnitus, Vertigo, Sinus pain, snoring.  Cardio: Denies chest pain, palpitations, irregular heartbeat, syncope, dyspnea, diaphoresis, orthopnea, PND, claudication, edema Respiratory: denies cough, dyspnea, DOE, pleurisy, hoarseness, laryngitis, wheezing.  Gastrointestinal: Denies  dysphagia, heartburn, reflux, water brash, pain, cramps, nausea, vomiting, bloating, diarrhea, constipation, hematemesis, melena, hematochezia, jaundice, hemorrhoids Genitourinary: Denies dysuria, frequency, urgency, nocturia, hesitancy, discharge, hematuria, flank pain Breast: Breast lumps, nipple discharge, bleeding.  Musculoskeletal: Denies arthralgia, myalgia, stiffness, Jt. Swelling, pain, limp, and strain/sprain. Denies falls. Skin: Denies puritis, rash, hives, warts, acne, eczema, changing in skin lesion Neuro: No weakness, tremor, incoordination, spasms, paresthesia, pain Psychiatric: Denies confusion, memory loss, sensory loss. Denies Depression. Endocrine: Denies change in weight, skin, hair change, nocturia, and paresthesia, diabetic polys, visual blurring, hyper / hypo glycemic episodes.  Heme/Lymph: No excessive bleeding, bruising, enlarged lymph nodes.  Physical Exam  BP 126/84   Pulse 63   Temp (!) 97.5 F (36.4 C)   Ht 5\' 5"  (1.651 m)   Wt 165 lb 12.8 oz (75.2 kg)   SpO2 100%   BMI 27.59 kg/m   General Appearance: Well nourished, well groomed and in no apparent distress.  Eyes: PERRLA, EOMs, conjunctiva no swelling or erythema, normal fundi and vessels. Sinuses: No frontal/maxillary tenderness ENT/Mouth: EACs patent / TMs  nl. Nares clear without erythema, swelling, mucoid exudates. Oral hygiene is good. No erythema, swelling, or exudate. Tongue normal, non-obstructing. Tonsils not swollen or erythematous. Hearing normal.  Neck: Supple, thyroid not palpable. No bruits, nodes or JVD. Respiratory: Respiratory effort normal.  BS equal and clear bilateral without rales, rhonci, wheezing or stridor. Cardio: Heart sounds are normal with regular rate and rhythm and no murmurs, rubs or gallops. Peripheral pulses are normal and equal bilaterally without edema. No aortic or femoral bruits. Chest: symmetric with normal excursions and percussion. Breasts: Symmetric, without lumps,  nipple discharge, retractions, or fibrocystic changes.  Abdomen: Flat, soft with bowel sounds active. Nontender, no guarding, rebound, hernias, masses, or organomegaly.  Lymphatics: Non tender without lymphadenopathy.  Genitourinary:  Musculoskeletal: Full ROM all peripheral extremities,  joint stability, 5/5 strength, and normal gait. Skin: Warm and dry without rashes, lesions, cyanosis, clubbing or  ecchymosis.  Neuro: Cranial nerves intact, reflexes equal bilaterally. Normal muscle tone, no cerebellar symptoms. Sensation intact.  Pysch: Alert and oriented X 3, normal affect, Insight and Judgment appropriate.   Assessment and Plan  1. Annual Preventative Screening Examination  2. Essential hypertension  - EKG 12-Lead - Urinalysis, Routine w reflex microscopic - Microalbumin / creatinine urine ratio - CBC with Differential/Platelet - COMPLETE METABOLIC PANEL WITH GFR - Magnesium - TSH  3. Hyperlipidemia, mixed  - EKG 12-Lead - Lipid panel - TSH  4. Abnormal glucose  - EKG 12-Lead - Hemoglobin A1c - Insulin, random  5. Vitamin D deficiency  - VITAMIN D 25 Hydroxy   6. Paroxysmal atrial fibrillation (HCC)  - EKG 12-Lead - TSH  7. Thoracic aortic aneurysm without rupture (HCC)   8. Pacemaker  - EKG 12-Lead  9. CHB (complete heart block) (HCC)  - EKG 12-Lead  10. Screening for colorectal cancer  - POC Hemoccult Bld/Stl   11. Screening for ischemic heart disease  - EKG 12-Lead  12. FHx: heart disease  - EKG 12-Lead  13. Former smoker  - EKG 12-Lead  14. Medication management  - Urinalysis, Routine w reflex microscopic - Microalbumin / creatinine urine ratio - CBC with Differential/Platelet - COMPLETE METABOLIC PANEL WITH GFR - Magnesium - Lipid panel - TSH - Hemoglobin A1c - Insulin, random - VITAMIN D 25 Hydroxy         Patient was counseled in prudent diet to achieve/maintain BMI less than 25 for weight control, BP monitoring, regular  exercise and medications. Discussed med's effects and SE's. Screening labs and tests as requested with regular follow-up as recommended. Over 40 minutes of exam, counseling, chart review and high complex critical decision making was performed.   Marinus Maw, MD

## 2021-01-08 ENCOUNTER — Encounter: Payer: Self-pay | Admitting: Internal Medicine

## 2021-01-08 ENCOUNTER — Ambulatory Visit (INDEPENDENT_AMBULATORY_CARE_PROVIDER_SITE_OTHER): Payer: Medicare PPO | Admitting: Internal Medicine

## 2021-01-08 ENCOUNTER — Other Ambulatory Visit: Payer: Self-pay

## 2021-01-08 VITALS — BP 126/84 | HR 63 | Temp 97.5°F | Ht 65.0 in | Wt 165.8 lb

## 2021-01-08 DIAGNOSIS — E559 Vitamin D deficiency, unspecified: Secondary | ICD-10-CM

## 2021-01-08 DIAGNOSIS — Z8249 Family history of ischemic heart disease and other diseases of the circulatory system: Secondary | ICD-10-CM | POA: Diagnosis not present

## 2021-01-08 DIAGNOSIS — I442 Atrioventricular block, complete: Secondary | ICD-10-CM

## 2021-01-08 DIAGNOSIS — Z79899 Other long term (current) drug therapy: Secondary | ICD-10-CM

## 2021-01-08 DIAGNOSIS — I712 Thoracic aortic aneurysm, without rupture, unspecified: Secondary | ICD-10-CM

## 2021-01-08 DIAGNOSIS — I480 Paroxysmal atrial fibrillation: Secondary | ICD-10-CM

## 2021-01-08 DIAGNOSIS — Z136 Encounter for screening for cardiovascular disorders: Secondary | ICD-10-CM | POA: Diagnosis not present

## 2021-01-08 DIAGNOSIS — Z87891 Personal history of nicotine dependence: Secondary | ICD-10-CM

## 2021-01-08 DIAGNOSIS — R7309 Other abnormal glucose: Secondary | ICD-10-CM

## 2021-01-08 DIAGNOSIS — Z1211 Encounter for screening for malignant neoplasm of colon: Secondary | ICD-10-CM

## 2021-01-08 DIAGNOSIS — Z0001 Encounter for general adult medical examination with abnormal findings: Secondary | ICD-10-CM

## 2021-01-08 DIAGNOSIS — E782 Mixed hyperlipidemia: Secondary | ICD-10-CM

## 2021-01-08 DIAGNOSIS — Z950 Presence of cardiac pacemaker: Secondary | ICD-10-CM

## 2021-01-08 DIAGNOSIS — I10 Essential (primary) hypertension: Secondary | ICD-10-CM | POA: Diagnosis not present

## 2021-01-08 DIAGNOSIS — Z0000 Encounter for general adult medical examination without abnormal findings: Secondary | ICD-10-CM

## 2021-01-08 LAB — CUP PACEART REMOTE DEVICE CHECK
Battery Remaining Longevity: 69 mo
Battery Remaining Percentage: 56 %
Battery Voltage: 2.89 V
Brady Statistic AP VP Percent: 95 %
Brady Statistic AP VS Percent: 1 %
Brady Statistic AS VP Percent: 4.3 %
Brady Statistic AS VS Percent: 1 %
Brady Statistic RA Percent Paced: 95 %
Brady Statistic RV Percent Paced: 99 %
Date Time Interrogation Session: 20220302020018
Implantable Lead Implant Date: 20140805
Implantable Lead Implant Date: 20140805
Implantable Lead Location: 753859
Implantable Lead Location: 753860
Implantable Lead Model: 1944
Implantable Lead Model: 1948
Implantable Pulse Generator Implant Date: 20140805
Lead Channel Impedance Value: 550 Ohm
Lead Channel Impedance Value: 630 Ohm
Lead Channel Pacing Threshold Amplitude: 0.5 V
Lead Channel Pacing Threshold Amplitude: 0.75 V
Lead Channel Pacing Threshold Pulse Width: 0.4 ms
Lead Channel Pacing Threshold Pulse Width: 0.4 ms
Lead Channel Sensing Intrinsic Amplitude: 12 mV
Lead Channel Sensing Intrinsic Amplitude: 3.2 mV
Lead Channel Setting Pacing Amplitude: 1 V
Lead Channel Setting Pacing Amplitude: 2 V
Lead Channel Setting Pacing Pulse Width: 0.4 ms
Lead Channel Setting Sensing Sensitivity: 4 mV
Pulse Gen Model: 2240
Pulse Gen Serial Number: 7523768

## 2021-01-08 MED ORDER — BUSPIRONE HCL 10 MG PO TABS: ORAL_TABLET | ORAL | 0 refills | Status: DC

## 2021-01-08 MED ORDER — METOPROLOL SUCCINATE ER 200 MG PO TB24: ORAL_TABLET | ORAL | 1 refills | Status: DC

## 2021-01-09 ENCOUNTER — Other Ambulatory Visit: Payer: Self-pay | Admitting: Internal Medicine

## 2021-01-09 MED ORDER — EZETIMIBE 10 MG PO TABS: ORAL_TABLET | ORAL | 0 refills | Status: DC

## 2021-01-09 NOTE — Progress Notes (Signed)
============================================================ ============================================================  -    Total chol = 183 - OK, but . . . . .  - Bad/Dangerous LDL Chol = 116 - too high,  (Ideal or Goal is less than 70  !  )   So . . . . Marland Kitchen Sent a new Rx for Zetia  to [employer] order   (Zetia is very safe - does it's  work on cholesterol in the intestines   &         doesn't even absorb in to the blood stream, so it can't cause side-effects ============================================================ ============================================================  -  A1c = 5.7% - still elevated in the borderline and                                              early or pre-diabetes range which has the same   300% increased risk for heart attack, stroke, cancer and                              Alzheimer- type  Vascular Dementia as full blown diabetes.   But the good news is that diet, exercise with                                         weight loss can cure the early diabetes at this point. ============================================================ ============================================================  -  Vitamin D = 93 - Excellent  ============================================================ ============================================================  -  All Else - CBC - Kidneys - Electrolytes - Liver - Magnesium & Thyroid    - all  Normal / OK ============================================================

## 2021-01-11 LAB — VITAMIN D 25 HYDROXY (VIT D DEFICIENCY, FRACTURES): Vit D, 25-Hydroxy: 93 ng/mL (ref 30–100)

## 2021-01-11 LAB — HEMOGLOBIN A1C
Hgb A1c MFr Bld: 5.7 % of total Hgb — ABNORMAL HIGH (ref <5.7)
Mean Plasma Glucose: 117 mg/dL
eAG (mmol/L): 6.5 mmol/L

## 2021-01-11 LAB — COMPLETE METABOLIC PANEL WITH GFR
AG Ratio: 1.8 (calc) (ref 1.0–2.5)
ALT: 14 U/L (ref 6–29)
AST: 14 U/L (ref 10–35)
Albumin: 4.2 g/dL (ref 3.6–5.1)
Alkaline phosphatase (APISO): 98 U/L (ref 37–153)
BUN: 12 mg/dL (ref 7–25)
CO2: 27 mmol/L (ref 20–32)
Calcium: 9.7 mg/dL (ref 8.6–10.4)
Chloride: 106 mmol/L (ref 98–110)
Creat: 0.85 mg/dL (ref 0.60–0.88)
GFR, Est African American: 75 mL/min/{1.73_m2} (ref >=60)
GFR, Est Non African American: 65 mL/min/{1.73_m2} (ref >=60)
Globulin: 2.4 g/dL (calc) (ref 1.9–3.7)
Glucose, Bld: 82 mg/dL (ref 65–99)
Potassium: 4.5 mmol/L (ref 3.5–5.3)
Sodium: 143 mmol/L (ref 135–146)
Total Bilirubin: 0.4 mg/dL (ref 0.2–1.2)
Total Protein: 6.6 g/dL (ref 6.1–8.1)

## 2021-01-11 LAB — CBC WITH DIFFERENTIAL/PLATELET
Absolute Monocytes: 318 cells/uL (ref 200–950)
Basophils Absolute: 30 cells/uL (ref 0–200)
Basophils Relative: 1 %
Eosinophils Absolute: 78 cells/uL (ref 15–500)
Eosinophils Relative: 2.6 %
HCT: 41.5 % (ref 35.0–45.0)
Hemoglobin: 13 g/dL (ref 11.7–15.5)
Lymphs Abs: 1278 cells/uL (ref 850–3900)
MCH: 27.5 pg (ref 27.0–33.0)
MCHC: 31.3 g/dL — ABNORMAL LOW (ref 32.0–36.0)
MCV: 87.9 fL (ref 80.0–100.0)
MPV: 11.1 fL (ref 7.5–12.5)
Monocytes Relative: 10.6 %
Neutro Abs: 1296 cells/uL — ABNORMAL LOW (ref 1500–7800)
Neutrophils Relative %: 43.2 %
Platelets: 201 10*3/uL (ref 140–400)
RBC: 4.72 10*6/uL (ref 3.80–5.10)
RDW: 12.4 % (ref 11.0–15.0)
Total Lymphocyte: 42.6 %
WBC: 3 10*3/uL — ABNORMAL LOW (ref 3.8–10.8)

## 2021-01-11 LAB — URINALYSIS, ROUTINE W REFLEX MICROSCOPIC
Bacteria, UA: NONE SEEN /HPF
Bilirubin Urine: NEGATIVE
Glucose, UA: NEGATIVE
Hgb urine dipstick: NEGATIVE
Hyaline Cast: NONE SEEN /LPF
Ketones, ur: NEGATIVE
Nitrite: NEGATIVE
Protein, ur: NEGATIVE
RBC / HPF: NONE SEEN /HPF (ref 0–2)
Specific Gravity, Urine: 1.004 (ref 1.001–1.03)
Squamous Epithelial / HPF: NONE SEEN /HPF
pH: 7.5 (ref 5.0–8.0)

## 2021-01-11 LAB — LIPID PANEL
Cholesterol: 183 mg/dL (ref <200)
HDL: 47 mg/dL — ABNORMAL LOW (ref >=50)
LDL Cholesterol (Calc): 116 mg/dL (calc) — ABNORMAL HIGH
Non-HDL Cholesterol (Calc): 136 mg/dL (calc) — ABNORMAL HIGH (ref <130)
Total CHOL/HDL Ratio: 3.9 (calc) (ref <5.0)
Triglycerides: 97 mg/dL (ref <150)

## 2021-01-11 LAB — INSULIN, RANDOM: Insulin: 8 u[IU]/mL

## 2021-01-11 LAB — MICROALBUMIN / CREATININE URINE RATIO
Creatinine, Urine: 19 mg/dL — ABNORMAL LOW (ref 20–275)
Microalb Creat Ratio: 21 mcg/mg creat (ref <30)
Microalb, Ur: 0.4 mg/dL

## 2021-01-11 LAB — TSH: TSH: 1.08 mIU/L (ref 0.40–4.50)

## 2021-01-11 LAB — MAGNESIUM: Magnesium: 2.2 mg/dL (ref 1.5–2.5)

## 2021-01-14 NOTE — Progress Notes (Signed)
Remote pacemaker transmission.   

## 2021-01-23 NOTE — Progress Notes (Signed)
Cardiology Office Note:    Date:  01/25/2021   ID:  Rahel, Carlton 01/24/1941, MRN [id number]  PCP:  Lucky Cowboy, MD   [facility]  Cardiologist:  Meriam Sprague, MD  Advanced Practice Provider:  Beatrice Lecher, PA-C Electrophysiologist:  Hillis Range, MD    Referring MD: Lucky Cowboy, MD     History of Present Illness:    Samiha Denapoli is a 80 y.o. female with a hx of paroxysmal Afib, CHB s/p PPM placement 06/2013, HTN, HLD, LBBB, non-obstructive CAD noted on cath in 2014, hypertrophic CM without LVOT obstruction who is followed by Dr. Johney Frame who now presents to clinic for follow-up.  Patient was previously followed by Dr. Shirlee Latch and last seen in clinic in 2018. She has a hx of HCM with asymmetric septal hypertrophy, MV systolic anterior motion and no significant LVOT gradient at rest.  She had a low risk ETT in 2016.  Her last echocardiogram was in 2018 and was c/w HCM.  She saw Dr. Johney Frame in 12/2018 and was last seen by EP in 12/2019 by Francis Dowse, PA-C.  Of note, her Apixaban was DC'd in 08/2019 due to low burden of AF on her pacer.    She was seen by Tereso Newcomer on 10/26/20 after calling with episode of chest pain and near syncope. Her device was interrogated and no abnormalities were detected. Thought her symptoms were related to taking extra doses of HCTZ. TTE stable with no LVOT gradient, G1DD, mild SAM with mild MR.  Today, the patient states she feels very well. Has started to do weight bearing exercises to build her strength which is going well. Blood pressure is well controlled on the new dose of metoprolol. Has not been requiring the HCTZ at home. Occasional SOB. Has occasional GERD as well; no exertional chest discomfort. No lightheadedness or dizziness. No palpitations. Has maintained good hyrdation.  Past Medical History:  Diagnosis Date  . Anxiety   . Atrophic vaginitis   . Chest pain    a. Adenosine  sestamibi (1/14) with no evidence of ischemia or infarction, EF 63%.   . Colon polyps   . Complete heart block (HCC) 06-2013   status post pacemaker implantation by Dr Johney Frame 06-11-2013  . Depression   . DI (detrusor instability)   . GERD (gastroesophageal reflux disease)    History + Hyplori via EGD  . H/O echocardiogram    a. Echo 11/2012:EF 50-55%, focal basal septal hypertrophy with some mitral valve SAM (mild) and MR.  This may be a sigmoid septum with advanced age or could be a variant of hypertrophic cardiomyopathy.  Marland Kitchen HOCM (hypertrophic obstructive cardiomyopathy) (HCC)    Echocardiogram 2/22: EF 60-65, no RWMA, severe asymmetric septal LVH, no LVOT, Gr 1 DD, mild SAM, mod MR, mild LAE, mod to severe TR, Asc Ao 42 mm, normal RVSF, RVSP 28  . HTN (hypertension)   . Hyperlipidemia   . LBBB (left bundle branch block)   . Leukopenia 05/19/2014  . Lymphocytosis 05/19/2014  . Migraines   . Myoview    Myoview 12/21: EF 69, no ischemia or infarction; low risk   . Osteoporosis   . Paroxysmal atrial fibrillation (HCC)   . Prediabetes   . Thoracic aortic aneurysm (HCC)    Chest CTA 2/22: Ascending thoracic aortic aneurysm 44 mm; Ao arch 34 mm>>repeat 1 year  . Vitamin D deficiency     Past Surgical History:  Procedure Laterality Date  .  BREAST BIOPSY Left 07/25/2019   ANGIOLIPOMA  . BREAST EXCISIONAL BIOPSY Right   . BREAST SURGERY     Breast cyst  . CATARACT EXTRACTION Left 2019   Dr. Dione Booze  . HEMORRHOID SURGERY    . LEFT HEART CATHETERIZATION WITH CORONARY ANGIOGRAM N/A 06/11/2013   Procedure: LEFT HEART CATHETERIZATION WITH CORONARY ANGIOGRAM;  Surgeon: Kathleene Hazel, MD;  Location: [facility] CATH LAB;  Service: Cardiovascular;  Laterality: N/A;  . PACEMAKER INSERTION  06-11-2013   STJ dual chamber pacemaker implanted by Dr Johney Frame for complete heart block  . PERMANENT PACEMAKER INSERTION Left 06/11/2013   Procedure: PERMANENT PACEMAKER INSERTION;  Surgeon: Kathleene Hazel, MD;   Location: [facility] CATH LAB;  Service: Cardiovascular;  Laterality: Left;  . PERMANENT PACEMAKER INSERTION N/A 06/11/2013   Procedure: PERMANENT PACEMAKER INSERTION;  Surgeon: Hillis Range, MD;  Location: [facility] CATH LAB;  Service: Cardiovascular;  Laterality: N/A;  . TEMPORARY PACEMAKER INSERTION N/A 06/11/2013   Procedure: TEMPORARY PACEMAKER INSERTION;  Surgeon: Kathleene Hazel, MD;  Location: [facility] CATH LAB;  Service: Cardiovascular;  Laterality: N/A;  . TUBAL LIGATION      Current Medications: Current Meds  Medication Sig  . acetaminophen (TYLENOL) 500 MG tablet Take 1,000 mg by mouth in the morning and at bedtime.  . ALPRAZolam (XANAX) 1 MG tablet Take      1/2 - 1 tablet     at Bedtime        ONLY       if needed for sleep &  Try limit to 5 days /week to avoid addiction  . Ascorbic Acid (VITAMIN C PO) Take 1 tablet by mouth daily.   . busPIRone (BUSPAR) 10 MG tablet Take  1/2 to 1 tablet  2 to 3 x /day  for Anxiety  . Calcium Carbonate-Vitamin D (CALCIUM + D PO) Take 1 tablet by mouth daily.   . Cholecalciferol (VITAMIN D PO) Take 5,000 Units by mouth 2 (two) times a day.   . citalopram (CELEXA) 20 MG tablet TAKE 1 TABLET EVERY DAY FOR MOOD  . ezetimibe (ZETIA) 10 MG tablet Take 1 tablet Daily for Cholesterol  . fluticasone (FLONASE) 50 MCG/ACT nasal spray Place 2 sprays into both nostrils daily as needed for allergies or rhinitis.  Marland Kitchen FOLIC ACID PO Take 1 tablet by mouth daily.   . hydrochlorothiazide (HYDRODIURIL) 25 MG tablet Take 25 mg by mouth daily as needed (swelling).   . hydrocortisone (PROCTOZONE-HC) 2.5 % rectal cream PLACE 1 APPLICATION RECTALLY 2 (TWO) TIMES DAILY.  Marland Kitchen ibuprofen (ADVIL) 600 MG tablet Take 600 mg by mouth in the morning and at bedtime.  Marland Kitchen MAGNESIUM PO Take 500 mg by mouth daily.   . metoprolol succinate (TOPROL-XL) 200 MG 24 hr tablet Take  1 tablet  Daily  for BP & Heart  . Multiple Vitamin (MULTIVITAMIN) tablet Take 1 tablet by mouth daily.  . pantoprazole  (PROTONIX) 40 MG tablet Take 1 tablet Daily with Supper for Indigestion & Acid Reflux  . Tretinoin, Facial Wrinkles, 0.05 % CREA APPLY TO AFFECTED AREA EVERY DAY AS DIRECTED     Allergies:   Levsin [hyoscyamine sulfate], Mobic [meloxicam], Topamax [topiramate], Zoloft [sertraline hcl], Actonel [risedronate sodium], Boniva [ibandronic acid], Evista [raloxifene], and Fosamax [alendronate sodium]   Social History   Socioeconomic History  . Marital status: Widowed    Spouse name: Not on file  . Number of children: Not on file  . Years of education: Not on file  . Highest  education level: Not on file  Occupational History  . Occupation: Part-time at [employer]  . Smoking status: Former Smoker    Types: Cigarettes    Quit date: 10/09/1975    Years since quitting: 45.3  . Smokeless tobacco: Never Used  Vaping Use  . Vaping Use: Never used  Substance and Sexual Activity  . Alcohol use: No  . Drug use: No  . Sexual activity: Never    Birth control/protection: Surgical, Post-menopausal  Other Topics Concern  . Not on file  Social History Narrative   Lives alone. Dtr in the area.   Social Determinants of Health   Financial Resource Strain: Not on file  Food Insecurity: Not on file  Transportation Needs: Not on file  Physical Activity: Not on file  Stress: Not on file  Social Connections: Not on file     Family History: The patient's family history includes Aneurysm in her brother; Breast cancer (age of onset: 21) in her sister; Cancer in her father; Diabetes in her brother, brother, and mother; Heart disease in her brother, brother, and mother; Hypertension in her brother; Stroke in her brother.  ROS:   Please see the history of present illness.    Review of Systems  Constitutional: Negative for chills and fever.  HENT: Negative for hearing loss.   Eyes: Negative for blurred vision and redness.  Respiratory: Negative for shortness of breath.   Cardiovascular:  Negative for chest pain, palpitations, orthopnea, claudication, leg swelling and PND.  Gastrointestinal: Positive for heartburn. Negative for abdominal pain and melena.  Genitourinary: Negative for dysuria and flank pain.  Musculoskeletal: Positive for myalgias. Negative for falls.  Neurological: Negative for dizziness and loss of consciousness.  Endo/Heme/Allergies: Negative for polydipsia.  Psychiatric/Behavioral: Negative for substance abuse.    EKGs/Labs/Other Studies Reviewed:    The following studies were reviewed today:  TTE 12/29/20: 1. Left ventricular ejection fraction, by estimation, is 60 to 65%. The  left ventricle has normal function. The left ventricle demonstrates  regional wall motion abnormalities with septal-lateral dyssynchrony. There  is severe asymmetric basal to mid  septal left ventricular hypertrophy. No LV outflow gradient. Left  ventricular diastolic parameters are consistent with Grade I diastolic  dysfunction (impaired relaxation).  2. The mitral valve shows mild systolic anterior motion. Moderate mitral  valve regurgitation. No evidence of mitral stenosis.  3. Left atrial size was mildly dilated.  4. Tricuspid valve regurgitation is moderate to severe.  5. The aortic valve is tricuspid. Aortic valve regurgitation is trivial.  No aortic stenosis is present.  6. Aortic dilatation noted. There is mild dilatation of the ascending  aorta, measuring 42 mm.  7. Right ventricular systolic function is normal. The right ventricular  size is mildly enlarged. There is normal pulmonary artery systolic  pressure. The estimated right ventricular systolic pressure is 28.0 mmHg.  8. The inferior vena cava is normal in size with greater than 50%  respiratory variability, suggesting right atrial pressure of 3 mmHg.  9. This echo is consistent with hypertrophic cardiomyopathy.  Echocardiogram 03/10/2017 Severe focal basal septal hypertrophy, moderate hypertrophy of  the myocardium, EF 60-65, normal wall motion, GR 1 DD, mild AI, trivial MR, normal RVSF, moderate TR, PASP 38  ETT 09/2015 Normal BP response, PVCs, no NSVT; low risk study  Echocardiogram 07/31/2015 Moderate asymmetric septal hypertrophy, EF 55-60, normal wall motion, G1 DD, mild AI, moderate S.A.M., moderate MR, moderate TR, peak LVOT gradient 9 mmHg  Cardiac catheterization 06/11/2013 LM  ostial 10 LAD mid mild irregularities EF 50-55  Myoview 11/26/2012 EF 63, no ischemia   Recent Labs: 01/08/2021: ALT 14; BUN 12; Creat 0.85; Hemoglobin 13.0; Magnesium 2.2; Platelets 201; Potassium 4.5; Sodium 143; TSH 1.08  Recent Lipid Panel    Component Value Date/Time   CHOL 183 01/08/2021 0952   TRIG 97 01/08/2021 0952   HDL 47 (L) 01/08/2021 0952   CHOLHDL 3.9 01/08/2021 0952   VLDL 20 04/11/2017 1025   LDLCALC 116 (H) 01/08/2021 0952     Risk Assessment/Calculations:    CHA2DS2-VASc Score = 5  This indicates a 7.2% annual risk of stroke. The patient's score is based upon: CHF History: Yes (structural heart disease (HOCM)) HTN History: Yes Diabetes History: No Stroke History: No Vascular Disease History: No Age Score: 2 Gender Score: 1    Physical Exam:    VS:  BP 114/76   Pulse 76   Ht 5\' 5"  (1.651 m)   Wt 175 lb (79.4 kg)   SpO2 96%   BMI 29.12 kg/m     Wt Readings from Last 3 Encounters:  01/25/21 175 lb (79.4 kg)  01/08/21 165 lb 12.8 oz (75.2 kg)  11/05/20 169 lb (76.7 kg)     GEN:  Well nourished, well developed in no acute distress HEENT: Normal NECK: No JVD; No carotid bruits CARDIAC: RRR, 1/6 systolic murmur, no rubs or gallops RESPIRATORY:  Clear to auscultation without rales, wheezing or rhonchi  ABDOMEN: Soft, non-tender, non-distended MUSCULOSKELETAL:  No edema; No deformity  SKIN: Warm and dry NEUROLOGIC:  Alert and oriented x 3 PSYCHIATRIC:  Normal affect   ASSESSMENT:    1. CHB (complete heart block) (HCC)   2. Cardiac pacemaker in situ    3. Hypertrophic obstructive cardiomyopathy (HCC)   4. Paroxysmal atrial fibrillation (HCC)   5. Essential hypertension   6. Precordial pain   7. Moderate mitral regurgitation    PLAN:    In order of problems listed above:  #HCM: No LVOT obstruction on TTE. Low risk ETT in 2016. No MRI due to PPM. Recent syncope appears to be secondary to taking extra doses of HCTZ with resultant dehydration. No recurrence of symptoms. Repeat TTE with LVEF 60-65%, no LVOT obstruction, mild SAM with moderate MR. -Continue metop 200mg  XL daily -ETT low risk -No arrhythmias on cardiac monitor -Maintain adequate hydration -Only using HCTZ as needed for LE edema  #Chest Pain: Cath 2014 with minimal plaque. Exercise myoview negative for ischemia and normal EF.  TTE 12/2020 with normal LVEF, severe asymmetric basal to mid septal LVH, no LVOT gradient, G1DD, mild SAM with moderate MR. Symptoms are not exertional and usually occur at night. May be related to GERD. -Low suspicion for cardiac etiology -Continue management of GERD  #Complete Heart Block: S/p PPM placement. -Follow-up with Dr. 2015 as scheduled   #Moderate MR with mild SAM: No HF symptoms.  -Monitor with serial TTEs  #HTN: -Continue metop 200mg  XL daily  #Paroxysmal Afib: Low burden. Not currently on [facility]. -Continue to monitor (has PPM for continuous monitoring) -Follow-up with Dr. Johney Frame as scheduled  #HLD: Intolerance to statins. -Continue zetia 10mg  daily     Medication Adjustments/Labs and Tests Ordered: Current medicines are reviewed at length with the patient today.  Concerns regarding medicines are outlined above.  No orders of the defined types were placed in this encounter.  No orders of the defined types were placed in this encounter.   Patient Instructions  Medication Instructions:  NO  CHANGES *If you need a refill on your cardiac medications before your next appointment, please call your pharmacy*   Lab  Work: NONE If you have labs (blood work) drawn today and your tests are completely normal, you will receive your results only by: Marland Kitchen. MyChart Message (if you have MyChart) OR . A paper copy in the mail If you have any lab test that is abnormal or we need to change your treatment, we will call you to review the results.   Testing/Procedures: NONE   Follow-Up: At [facility], you and your health needs are our priority.  As part of our continuing mission to provide you with exceptional heart care, we have created designated Provider Care Teams.  These Care Teams include your primary Cardiologist (physician) and Advanced Practice Providers (APPs -  Physician Assistants and Nurse Practitioners) who all work together to provide you with the care you need, when you need it.  We recommend signing up for the patient portal called "MyChart".  Sign up information is provided on this After Visit Summary.  MyChart is used to connect with patients for Virtual Visits (Telemedicine).  Patients are able to view lab/test results, encounter notes, upcoming appointments, etc.  Non-urgent messages can be sent to your provider as well.   To learn more about what you can do with MyChart, go to [email or web address].    Your next appointment:   6 month(s)  The format for your next appointment:   In Person  Provider:   You will see one of the following Advanced Practice Providers on your designated Care Team:    Tereso NewcomerScott Weaver, PA-C  Chelsea AusVin Bhagat, [place]-C     Other Instructions      Signed, Meriam SpragueHeather E Allysson Rinehimer, MD  01/25/2021 12:05 PM    [facility]

## 2021-01-25 ENCOUNTER — Other Ambulatory Visit: Payer: Self-pay

## 2021-01-25 ENCOUNTER — Ambulatory Visit: Payer: Medicare PPO | Admitting: Cardiology

## 2021-01-25 ENCOUNTER — Encounter: Payer: Self-pay | Admitting: Cardiology

## 2021-01-25 VITALS — BP 114/76 | HR 76 | Ht 65.0 in | Wt 175.0 lb

## 2021-01-25 DIAGNOSIS — I442 Atrioventricular block, complete: Secondary | ICD-10-CM | POA: Diagnosis not present

## 2021-01-25 DIAGNOSIS — R072 Precordial pain: Secondary | ICD-10-CM

## 2021-01-25 DIAGNOSIS — I340 Nonrheumatic mitral (valve) insufficiency: Secondary | ICD-10-CM | POA: Diagnosis not present

## 2021-01-25 DIAGNOSIS — I421 Obstructive hypertrophic cardiomyopathy: Secondary | ICD-10-CM | POA: Diagnosis not present

## 2021-01-25 DIAGNOSIS — Z950 Presence of cardiac pacemaker: Secondary | ICD-10-CM | POA: Diagnosis not present

## 2021-01-25 DIAGNOSIS — I10 Essential (primary) hypertension: Secondary | ICD-10-CM | POA: Diagnosis not present

## 2021-01-25 DIAGNOSIS — I480 Paroxysmal atrial fibrillation: Secondary | ICD-10-CM

## 2021-01-25 NOTE — Patient Instructions (Signed)
Medication Instructions:  NO CHANGES *If you need a refill on your cardiac medications before your next appointment, please call your pharmacy*   Lab Work: NONE If you have labs (blood work) drawn today and your tests are completely normal, you will receive your results only by: Marland Kitchen MyChart Message (if you have MyChart) OR . A paper copy in the mail If you have any lab test that is abnormal or we need to change your treatment, we will call you to review the results.   Testing/Procedures: NONE   Follow-Up: At [facility], you and your health needs are our priority.  As part of our continuing mission to provide you with exceptional heart care, we have created designated Provider Care Teams.  These Care Teams include your primary Cardiologist (physician) and Advanced Practice Providers (APPs -  Physician Assistants and Nurse Practitioners) who all work together to provide you with the care you need, when you need it.  We recommend signing up for the patient portal called "MyChart".  Sign up information is provided on this After Visit Summary.  MyChart is used to connect with patients for Virtual Visits (Telemedicine).  Patients are able to view lab/test results, encounter notes, upcoming appointments, etc.  Non-urgent messages can be sent to your provider as well.   To learn more about what you can do with MyChart, go to [email or web address].    Your next appointment:   6 month(s)  The format for your next appointment:   In Person  Provider:   You will see one of the following Advanced Practice Providers on your designated Care Team:    Tereso Newcomer, PA-C  Chelsea Aus, [place]     Other Instructions

## 2021-02-02 ENCOUNTER — Other Ambulatory Visit: Payer: Self-pay

## 2021-02-02 DIAGNOSIS — Z1211 Encounter for screening for malignant neoplasm of colon: Secondary | ICD-10-CM

## 2021-02-02 LAB — POC HEMOCCULT BLD/STL (HOME/3-CARD/SCREEN)
Card #2 Fecal Occult Blod, POC: NEGATIVE
Card #3 Fecal Occult Blood, POC: NEGATIVE
Fecal Occult Blood, POC: NEGATIVE

## 2021-02-03 DIAGNOSIS — Z1211 Encounter for screening for malignant neoplasm of colon: Secondary | ICD-10-CM | POA: Diagnosis not present

## 2021-02-03 DIAGNOSIS — Z1212 Encounter for screening for malignant neoplasm of rectum: Secondary | ICD-10-CM | POA: Diagnosis not present

## 2021-02-20 ENCOUNTER — Emergency Department (HOSPITAL_BASED_OUTPATIENT_CLINIC_OR_DEPARTMENT_OTHER): Payer: Medicare PPO

## 2021-02-20 ENCOUNTER — Other Ambulatory Visit: Payer: Self-pay

## 2021-02-20 ENCOUNTER — Emergency Department (HOSPITAL_BASED_OUTPATIENT_CLINIC_OR_DEPARTMENT_OTHER)
Admission: EM | Admit: 2021-02-20 | Discharge: 2021-02-20 | Disposition: A | Discharge status: Home / Self Care | Payer: Medicare PPO | Attending: Emergency Medicine | Admitting: Emergency Medicine

## 2021-02-20 ENCOUNTER — Encounter (HOSPITAL_BASED_OUTPATIENT_CLINIC_OR_DEPARTMENT_OTHER): Payer: Self-pay | Admitting: Emergency Medicine

## 2021-02-20 DIAGNOSIS — Z950 Presence of cardiac pacemaker: Secondary | ICD-10-CM | POA: Insufficient documentation

## 2021-02-20 DIAGNOSIS — Z79899 Other long term (current) drug therapy: Secondary | ICD-10-CM | POA: Insufficient documentation

## 2021-02-20 DIAGNOSIS — S0083XA Contusion of other part of head, initial encounter: Secondary | ICD-10-CM | POA: Insufficient documentation

## 2021-02-20 DIAGNOSIS — Z87891 Personal history of nicotine dependence: Secondary | ICD-10-CM | POA: Diagnosis not present

## 2021-02-20 DIAGNOSIS — I10 Essential (primary) hypertension: Secondary | ICD-10-CM | POA: Diagnosis not present

## 2021-02-20 DIAGNOSIS — W01198A Fall on same level from slipping, tripping and stumbling with subsequent striking against other object, initial encounter: Secondary | ICD-10-CM | POA: Insufficient documentation

## 2021-02-20 DIAGNOSIS — S0093XA Contusion of unspecified part of head, initial encounter: Secondary | ICD-10-CM

## 2021-02-20 DIAGNOSIS — R222 Localized swelling, mass and lump, trunk: Secondary | ICD-10-CM | POA: Diagnosis not present

## 2021-02-20 DIAGNOSIS — S199XXA Unspecified injury of neck, initial encounter: Secondary | ICD-10-CM | POA: Diagnosis not present

## 2021-02-20 DIAGNOSIS — S0990XA Unspecified injury of head, initial encounter: Secondary | ICD-10-CM | POA: Diagnosis not present

## 2021-02-20 DIAGNOSIS — R9082 White matter disease, unspecified: Secondary | ICD-10-CM | POA: Diagnosis not present

## 2021-02-20 NOTE — ED Triage Notes (Signed)
Reports she was stepping up on a step and fell to the left hitting her head on the chair rail.  Hematoma noted to left temple.  Denies any LOC.

## 2021-02-20 NOTE — ED Notes (Signed)
About 1 - 2 hours ago fell while going up stairs on first step hitting head on rail then falling completely down.  Denies LOC.  Note hematoma to left forehead about two inches above eye.

## 2021-02-20 NOTE — ED Provider Notes (Signed)
[facility] EMERGENCY DEPARTMENT Provider Note   CSN: [id number] Arrival date & time: 02/20/21  2129     History Chief Complaint  Patient presents with  . Fall    Alexandra Wheeler is a 80 y.o. female.  Patient with mechanical fall just prior to arrival.  Hit the left side of her head while going up a flight of stairs she tripped.  Hit the rail.  Did not lose consciousness.  Not on blood thinners.  No neck pain.  No abdominal pain or extremity pain.  Ambulatory afterwards.  The history is provided by the patient.  Fall This is a new problem. The current episode started less than 1 hour ago. Associated symptoms include headaches. Pertinent negatives include no chest pain, no abdominal pain and no shortness of breath. Nothing aggravates the symptoms. Nothing relieves the symptoms. She has tried nothing for the symptoms. The treatment provided no relief.       Past Medical History:  Diagnosis Date  . Anxiety   . Atrophic vaginitis   . Chest pain    a. Adenosine sestamibi (1/14) with no evidence of ischemia or infarction, EF 63%.   . Colon polyps   . Complete heart block (HCC) 06-2013   status post pacemaker implantation by Dr Johney Frame 06-11-2013  . Depression   . DI (detrusor instability)   . GERD (gastroesophageal reflux disease)    History + Hyplori via EGD  . H/O echocardiogram    a. Echo 11/2012:EF 50-55%, focal basal septal hypertrophy with some mitral valve SAM (mild) and MR.  This may be a sigmoid septum with advanced age or could be a variant of hypertrophic cardiomyopathy.  Marland Kitchen HOCM (hypertrophic obstructive cardiomyopathy) (HCC)    Echocardiogram 2/22: EF 60-65, no RWMA, severe asymmetric septal LVH, no LVOT, Gr 1 DD, mild SAM, mod MR, mild LAE, mod to severe TR, Asc Ao 42 mm, normal RVSF, RVSP 28  . HTN (hypertension)   . Hyperlipidemia   . LBBB (left bundle branch block)   . Leukopenia 05/19/2014  . Lymphocytosis 05/19/2014  . Migraines   . Myoview     Myoview 12/21: EF 69, no ischemia or infarction; low risk   . Osteoporosis   . Paroxysmal atrial fibrillation (HCC)   . Prediabetes   . Thoracic aortic aneurysm (HCC)    Chest CTA 2/22: Ascending thoracic aortic aneurysm 44 mm; Ao arch 34 mm>>repeat 1 year  . Vitamin D deficiency     Patient Active Problem List   Diagnosis Date Noted  . Former smoker 01/07/2021  . FHx: heart disease 01/07/2021  . Thoracic aortic aneurysm (HCC) by Chest CTA 12/2020   . HOCM (hypertrophic obstructive cardiomyopathy) (HCC)   . 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 Episodic tension-type headache, not intractable 02/12/2020  . Familial tremor 06/10/2019  . GERD (gastroesophageal reflux disease) 02/25/2019  . Major depressive disorder, recurrent episode, mild with anxious distress (HCC) 02/25/2019  . Pacemaker 02/25/2019  . Paroxysmal atrial fibrillation (HCC) 02/25/2019  . Chronic anticoagulation 02/25/2019  . Hypertrophic obstructive cardiomyopathy (HCC) 09/21/2015  . Overweight (BMI 25.0-29.9) 09/01/2015  . Medication management 08/22/2014  . Vitamin D deficiency   . Abnormal glucose   . Essential hypertension   . Hyperlipidemia, mixed   . CHB (complete heart block) (HCC) 06/10/2013  . LBBB (left bundle branch block) 11/15/2012  . Osteoporosis   . DI (detrusor instability)   . Atrophic vaginitis     Past Surgical History:  Procedure Laterality  Date  . BREAST BIOPSY Left 07/25/2019   ANGIOLIPOMA  . BREAST EXCISIONAL BIOPSY Right   . BREAST SURGERY     Breast cyst  . CATARACT EXTRACTION Left 2019   Dr. Dione BoozeGroat  . HEMORRHOID SURGERY    . LEFT HEART CATHETERIZATION WITH CORONARY ANGIOGRAM N/A 06/11/2013   Procedure: LEFT HEART CATHETERIZATION WITH CORONARY ANGIOGRAM;  Surgeon: Kathleene Hazelhristopher D McAlhany, MD;  [facility];  Service: Cardiovascular;  Laterality: N/A;  . PACEMAKER INSERTION  06-11-2013   STJ dual chamber pacemaker implanted by Dr Johney FrameAllred for complete heart block  .  PERMANENT PACEMAKER INSERTION Left 06/11/2013   Procedure: PERMANENT PACEMAKER INSERTION;  Surgeon: Kathleene Hazelhristopher D McAlhany, MD;  [facility];  Service: Cardiovascular;  Laterality: Left;  . PERMANENT PACEMAKER INSERTION N/A 06/11/2013   Procedure: PERMANENT PACEMAKER INSERTION;  Surgeon: Hillis RangeJames Allred, MD;  [facility];  Service: Cardiovascular;  Laterality: N/A;  . TEMPORARY PACEMAKER INSERTION N/A 06/11/2013   Procedure: TEMPORARY PACEMAKER INSERTION;  Surgeon: Kathleene Hazelhristopher D McAlhany, MD;  [facility];  Service: Cardiovascular;  Laterality: N/A;  . TUBAL LIGATION       OB History    Gravida  2   Para  2   Term  2   Preterm      AB      Living  1     SAB      IAB      Ectopic      Multiple      Live Births              Family History  Problem Relation Age of Onset  . Diabetes Mother   . Heart disease Mother   . Diabetes Brother   . Hypertension Brother   . Heart disease Brother   . Breast cancer Sister [id number]  . Aneurysm Brother   . Stroke Brother   . Cancer Father        prostate  . Diabetes Brother   . Heart disease Brother     Social History   Tobacco Use  . Smoking status: Former Smoker    Types: Cigarettes    Quit date: 10/09/1975    Years since quitting: 45.4  . Smokeless tobacco: Never Used  Vaping Use  . Vaping Use: Never used  Substance Use Topics  . Alcohol use: No  . Drug use: No    Home Medications Prior to Admission medications   Medication Sig Start Date End Date Taking? Authorizing Provider  acetaminophen (TYLENOL) 500 MG tablet Take 1,000 mg by mouth in the morning and at bedtime.    [provider]  ALPRAZolam Prudy Feeler(XANAX) 1 MG tablet Take      1/2 - 1 tablet     at Bedtime        ONLY       if needed for sleep &  Try limit to 5 days /week to avoid addiction 10/18/20   Lucky CowboyMcKeown, William, MD  Ascorbic Acid (VITAMIN C PO) Take 1 tablet by mouth daily.     [provider]  busPIRone (BUSPAR) 10 MG  tablet Take  1/2 to 1 tablet  2 to 3 x /day  for Anxiety 01/08/21   Lucky CowboyMcKeown, William, MD  Calcium Carbonate-Vitamin D (CALCIUM + D PO) Take 1 tablet by mouth daily.     [provider]  Cholecalciferol (VITAMIN D PO) Take 5,000 Units by mouth 2 (two) times a day.  [provider]  citalopram (CELEXA) 20 MG tablet TAKE 1 TABLET EVERY DAY FOR MOOD 09/10/20   Lucky Cowboy, MD  ezetimibe (ZETIA) 10 MG tablet Take 1 tablet Daily for Cholesterol 01/09/21   Lucky Cowboy, MD  fluticasone [facility]) 50 MCG/ACT nasal spray Place 2 sprays into both nostrils daily as needed for allergies or rhinitis.    [provider]  FOLIC ACID PO Take 1 tablet by mouth daily.     [provider]  hydrochlorothiazide (HYDRODIURIL) 25 MG tablet Take 25 mg by mouth daily as needed (swelling).     [provider]  hydrocortisone (PROCTOZONE-HC) 2.5 % rectal cream PLACE 1 APPLICATION RECTALLY 2 (TWO) TIMES DAILY. 05/16/18   Lucky Cowboy, MD  ibuprofen (ADVIL) 600 MG tablet Take 600 mg by mouth in the morning and at bedtime.    [provider]  MAGNESIUM PO Take 500 mg by mouth daily.     [provider]  metoprolol succinate (TOPROL-XL) 200 MG 24 hr tablet Take  1 tablet  Daily  for BP & Heart 01/08/21   Lucky Cowboy, MD  Multiple Vitamin (MULTIVITAMIN) tablet Take 1 tablet by mouth daily.    [provider]  pantoprazole (PROTONIX) 40 MG tablet Take 1 tablet Daily with Supper for Indigestion & Acid Reflux 06/20/19   Lucky Cowboy, MD  Tretinoin, Facial Wrinkles, 0.05 % CREA APPLY TO AFFECTED AREA EVERY DAY AS DIRECTED 05/18/20   Lucky Cowboy, MD    Allergies    Levsin [hyoscyamine sulfate], Mobic [meloxicam], Topamax [topiramate], Zoloft [sertraline hcl], Actonel [risedronate sodium], Boniva [ibandronic acid], Evista [raloxifene], and Fosamax [alendronate sodium]  Review of Systems   Review of Systems  Constitutional: Negative for chills  and fever.  HENT: Negative for ear pain and sore throat.   Eyes: Negative for pain and visual disturbance.  Respiratory: Negative for cough and shortness of breath.   Cardiovascular: Negative for chest pain and palpitations.  Gastrointestinal: Negative for abdominal pain and vomiting.  Genitourinary: Negative for dysuria and hematuria.  Musculoskeletal: Negative for arthralgias and back pain.  Skin: Positive for wound. Negative for color change and rash.  Neurological: Positive for headaches. Negative for dizziness, tremors, seizures, syncope, facial asymmetry, speech difficulty, weakness, light-headedness and numbness.  All other systems reviewed and are negative.   Physical Exam Updated Vital Signs  ED Triage Vitals  Enc Vitals Group     BP 02/20/21 2141 (!) 150/87     Pulse Rate 02/20/21 2141 69     Resp 02/20/21 2141 18     Temp 02/20/21 2141 (!) 97.4 F (36.3 C)     Temp Source 02/20/21 2141 Oral     SpO2 02/20/21 2141 97 %     Weight 02/20/21 2139 160 lb (72.6 kg)     Height 02/20/21 2139  (1.651 m)     Head Circumference --      Peak Flow --      Pain Score 02/20/21 2138 5     Pain Loc --      Pain Edu? --      Excl. in GC? --      Physical Exam Vitals and nursing note reviewed.  Constitutional:      General: She is not in acute distress.    Appearance: She is well-developed. She is not ill-appearing.  HENT:     Head:     Comments: Hematoma over left side of forehead/temporal area    Nose: Nose normal.  Mouth/Throat:     Mouth: Mucous membranes are moist.  Eyes:     Extraocular Movements: Extraocular movements intact.     Conjunctiva/sclera: Conjunctivae normal.     Pupils: Pupils are equal, round, and reactive to light.  Cardiovascular:     Rate and Rhythm: Normal rate and regular rhythm.     Pulses: Normal pulses.     Heart sounds: Normal heart sounds. No murmur heard.   Pulmonary:     Effort: Pulmonary effort is normal. No respiratory  distress.     Breath sounds: Normal breath sounds.  Abdominal:     General: Abdomen is flat.     Palpations: Abdomen is soft.     Tenderness: There is no abdominal tenderness.  Musculoskeletal:        General: No tenderness.     Cervical back: Normal range of motion and neck supple. No tenderness.     Comments: No midline spinal pain  Skin:    General: Skin is warm and dry.     Capillary Refill: Capillary refill takes less than 2 seconds.  Neurological:     General: No focal deficit present.     Mental Status: She is alert and oriented to person, place, and time.     Cranial Nerves: No cranial nerve deficit.     Sensory: No sensory deficit.     Motor: No weakness.     Comments: 5+ out of 5 strength throughout, normal sensation, no drift, normal finger-to-nose finger, normal speech     ED Results / Procedures / Treatments   Labs (all labs ordered are listed, but only abnormal results are displayed) Labs Reviewed - No data to display  EKG None  Radiology CT Head Wo Contrast  Result Date: 02/20/2021 CLINICAL DATA:  Fall, hit head EXAM: CT HEAD WITHOUT CONTRAST TECHNIQUE: Contiguous axial images were obtained from the base of the skull through the vertex without intravenous contrast. COMPARISON:  10/09/2015 FINDINGS: Brain: No acute intracranial abnormality. Specifically, no hemorrhage, hydrocephalus, mass lesion, acute infarction, or significant intracranial injury. Mild chronic small vessel disease throughout the deep white matter. Vascular: No hyperdense vessel or unexpected calcification. Skull: No acute calvarial abnormality. Sinuses/Orbits: No acute findings Other: None IMPRESSION: No acute intracranial abnormality. Electronically Signed   By: Charlett Nose M.D.   On: 02/20/2021 22:25   CT Cervical Spine Wo Contrast  Result Date: 02/20/2021 CLINICAL DATA:  Fall, hit head EXAM: CT CERVICAL SPINE WITHOUT CONTRAST TECHNIQUE: Multidetector CT imaging of the cervical spine was  performed without intravenous contrast. Multiplanar CT image reconstructions were also generated. COMPARISON:  None. FINDINGS: Alignment: No subluxation. Skull base and vertebrae: No acute fracture. No primary bone lesion or focal pathologic process. Soft tissues and spinal canal: No prevertebral fluid or swelling. No visible canal hematoma. Disc levels:  Diffuse degenerative disc disease and facet disease. 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 No acute findings. Other: None IMPRESSION: No acute bony abnormality. Electronically Signed   By: Charlett Nose M.D.   On: 02/20/2021 22:28    Procedures Procedures   Medications Ordered in ED Medications - No data to display  ED Course  I have reviewed the triage vital signs and the nursing notes.  Pertinent labs & imaging results that were available during my care of the patient were reviewed by me and considered in my medical decision making (see chart for details).    MDM  Rules/Calculators/A&P                          Persia Lintner is here after mechanical fall.  No loss of consciousness.  Hematoma over the left side of the forehead.  Neurologically intact.  Not on blood thinners.  No extremity tenderness.  No midline spinal tenderness.  CT of head and neck are unremarkable.  Overall contusion.  Educated about possibly concussion symptoms.  Ambulatory.  Discharged in good condition.  Given reassurance.  Understands return precautions.  This chart was dictated using voice recognition software.  Despite best efforts to proofread,  errors can occur which can change the documentation meaning.    Final Clinical Impression(s) / ED Diagnoses Final diagnoses:  Contusion of head, unspecified part of head, initial encounter    Rx / DC Orders ED Discharge Orders    None       Virgina Norfolk, DO 02/20/21 2234

## 2021-02-20 NOTE — ED Notes (Signed)
Back from CT scan

## 2021-02-20 NOTE — ED Notes (Signed)
Patient transported to CT 

## 2021-03-25 ENCOUNTER — Other Ambulatory Visit: Payer: Self-pay | Admitting: Internal Medicine

## 2021-04-07 ENCOUNTER — Ambulatory Visit (INDEPENDENT_AMBULATORY_CARE_PROVIDER_SITE_OTHER): Payer: Medicare PPO

## 2021-04-07 DIAGNOSIS — I442 Atrioventricular block, complete: Secondary | ICD-10-CM | POA: Diagnosis not present

## 2021-04-12 LAB — CUP PACEART REMOTE DEVICE CHECK
Battery Remaining Longevity: 69 mo
Battery Remaining Percentage: 56 %
Battery Voltage: 2.89 V
Brady Statistic AP VP Percent: 96 %
Brady Statistic AP VS Percent: 1 %
Brady Statistic AS VP Percent: 4.2 %
Brady Statistic AS VS Percent: 1 %
Brady Statistic RA Percent Paced: 95 %
Brady Statistic RV Percent Paced: 99 %
Date Time Interrogation Session: 20220605184130
Implantable Lead Implant Date: 20140805
Implantable Lead Implant Date: 20140805
Implantable Lead Location: 753859
Implantable Lead Location: 753860
Implantable Lead Model: 1944
Implantable Lead Model: 1948
Implantable Pulse Generator Implant Date: 20140805
Lead Channel Impedance Value: 560 Ohm
Lead Channel Impedance Value: 630 Ohm
Lead Channel Pacing Threshold Amplitude: 0.5 V
Lead Channel Pacing Threshold Amplitude: 0.875 V
Lead Channel Pacing Threshold Pulse Width: 0.4 ms
Lead Channel Pacing Threshold Pulse Width: 0.4 ms
Lead Channel Sensing Intrinsic Amplitude: 12 mV
Lead Channel Sensing Intrinsic Amplitude: 2.3 mV
Lead Channel Setting Pacing Amplitude: 1.125
Lead Channel Setting Pacing Amplitude: 2 V
Lead Channel Setting Pacing Pulse Width: 0.4 ms
Lead Channel Setting Sensing Sensitivity: 4 mV
Pulse Gen Model: 2240
Pulse Gen Serial Number: 7523768

## 2021-04-15 ENCOUNTER — Other Ambulatory Visit: Payer: Self-pay | Admitting: Internal Medicine

## 2021-04-15 DIAGNOSIS — R450 Nervousness: Secondary | ICD-10-CM

## 2021-04-19 ENCOUNTER — Other Ambulatory Visit: Payer: Self-pay

## 2021-04-19 DIAGNOSIS — R7309 Other abnormal glucose: Secondary | ICD-10-CM

## 2021-04-19 MED ORDER — BLOOD GLUCOSE MONITOR KIT: PACK | 0 refills | Status: DC

## 2021-04-21 ENCOUNTER — Ambulatory Visit: Payer: Medicare PPO | Admitting: Adult Health

## 2021-04-26 ENCOUNTER — Other Ambulatory Visit: Payer: Self-pay | Admitting: Internal Medicine

## 2021-04-26 MED ORDER — ACCU-CHEK GUIDE CONTROL VI LIQD: 3 refills | Status: DC

## 2021-04-26 MED ORDER — BD SWAB SINGLE USE REGULAR PADS: MEDICATED_PAD | 3 refills | Status: AC

## 2021-04-30 NOTE — Progress Notes (Signed)
Remote pacemaker transmission.   

## 2021-06-21 ENCOUNTER — Other Ambulatory Visit: Payer: Self-pay | Admitting: Internal Medicine

## 2021-06-21 DIAGNOSIS — R450 Nervousness: Secondary | ICD-10-CM

## 2021-06-21 MED ORDER — ALPRAZOLAM 1 MG PO TABS: ORAL_TABLET | ORAL | 0 refills | Status: DC

## 2021-06-29 ENCOUNTER — Encounter: Payer: Self-pay | Admitting: Nurse Practitioner

## 2021-06-29 ENCOUNTER — Other Ambulatory Visit: Payer: Self-pay

## 2021-06-29 ENCOUNTER — Ambulatory Visit: Payer: Medicare PPO | Admitting: Nurse Practitioner

## 2021-06-29 ENCOUNTER — Other Ambulatory Visit: Payer: Self-pay | Admitting: Internal Medicine

## 2021-06-29 VITALS — Temp 98.0°F

## 2021-06-29 DIAGNOSIS — U071 COVID-19: Secondary | ICD-10-CM | POA: Diagnosis not present

## 2021-06-29 DIAGNOSIS — I10 Essential (primary) hypertension: Secondary | ICD-10-CM

## 2021-06-29 MED ORDER — DEXAMETHASONE 1 MG PO TABS: ORAL_TABLET | ORAL | 0 refills | Status: DC

## 2021-06-29 MED ORDER — BENZONATATE 100 MG PO CAPS: 100.0000 mg | ORAL_CAPSULE | Freq: Three times a day (TID) | ORAL | 0 refills | Status: DC | PRN

## 2021-06-29 MED ORDER — AZITHROMYCIN 250 MG PO TABS: ORAL_TABLET | ORAL | 1 refills | Status: DC

## 2021-06-29 NOTE — Progress Notes (Signed)
THIS ENCOUNTER IS A VIRTUAL VISIT DUE TO COVID-19 - PATIENT WAS NOT SEEN IN THE OFFICE.  PATIENT HAS CONSENTED TO VIRTUAL VISIT / TELEMEDICINE VISIT   Virtual Visit via video Note  I connected with  Alexandra Wheeler on 06/29/2021 by telephone.  I verified that I am speaking with the correct person using two identifiers.    I discussed the limitations of evaluation and management by telemedicine and the availability of in person appointments. The patient expressed understanding and agreed to proceed.  History of Present Illness:  There were no vitals taken for this visit. 80 y.o. patient contacted office reporting URI sx Congestion, headache, body aches . she tested positive by home test on Sunday. OV was conducted by telephone to minimize exposure. This patient was not vaccinated for covid 19.  Sx began fever and sore throat 3 days ago   Treatments tried so far: Tylenol and Advil   Exposures: unknown   Medications   Current Outpatient Medications (Cardiovascular):    ezetimibe (ZETIA) 10 MG tablet, Take 1 tablet Daily for Cholesterol   hydrochlorothiazide (HYDRODIURIL) 25 MG tablet, Take 25 mg by mouth daily as needed (swelling).    metoprolol succinate (TOPROL-XL) 200 MG 24 hr tablet, Take  1 tablet  Daily  for BP & Heart  Current Outpatient Medications (Respiratory):    fluticasone (FLONASE) 50 MCG/ACT nasal spray, Place 2 sprays into both nostrils daily as needed for allergies or rhinitis.  Current Outpatient Medications (Analgesics):    acetaminophen (TYLENOL) 500 MG tablet, Take 1,000 mg by mouth in the morning and at bedtime.   IBU 600 MG tablet, TAKE 1 TABLET THREE TIMES DAILY WITH FOOD FOR INFLAMMATION AND PAIN  Current Outpatient Medications (Hematological):    FOLIC ACID PO, Take 1 tablet by mouth daily.   Current Outpatient Medications (Other):    Alcohol Swabs (B-D SINGLE USE SWABS REGULAR) PADS, Use as directed for Glucose monitoring Daily   ALPRAZolam  (XANAX) 1 MG tablet, TAKE 1/2 TO 1 TABLET AT BEDTIME ONLY IF NEEDED FOR SLEEP AND TRY LIMIT TO 5 DAYS PER WEEK TO AVOID ADDICTION   Ascorbic Acid (VITAMIN C PO), Take 1 tablet by mouth daily.    Blood Glucose Calibration (ACCU-CHEK GUIDE CONTROL) LIQD, Use  as directed  to validate Daily  Blood Sugars   blood glucose meter kit and supplies KIT, Check glucose once a day.   busPIRone (BUSPAR) 10 MG tablet, Take  1/2 to 1 tablet  2 to 3 x /day  for Anxiety   Calcium Carbonate-Vitamin D (CALCIUM + D PO), Take 1 tablet by mouth daily.    Cholecalciferol (VITAMIN D PO), Take 5,000 Units by mouth 2 (two) times a day.    citalopram (CELEXA) 20 MG tablet, TAKE 1 TABLET EVERY DAY FOR MOOD   hydrocortisone (PROCTOZONE-HC) 2.5 % rectal cream, PLACE 1 APPLICATION RECTALLY 2 (TWO) TIMES DAILY.   MAGNESIUM PO, Take 500 mg by mouth daily.    Multiple Vitamin (MULTIVITAMIN) tablet, Take 1 tablet by mouth daily.   pantoprazole (PROTONIX) 40 MG tablet, Take 1 tablet Daily with Supper for Indigestion & Acid Reflux   Tretinoin, Facial Wrinkles, 0.05 % CREA, APPLY TO AFFECTED AREA EVERY DAY AS DIRECTED  Allergies:  Allergies  Allergen Reactions   Levsin [Hyoscyamine Sulfate]     unknown   Mobic [Meloxicam]     unknown    Topamax [Topiramate]     unknown    Zoloft [Sertraline Hcl]  unknown    Actonel [Risedronate Sodium] Other (See Comments)    Pt didn't like the way it made her feel    Boniva [Ibandronic Acid] Other (See Comments)    Pt didn't like the way it made her feel   Evista [Raloxifene] Other (See Comments)    Pt didn't like the way it made her feel   Fosamax [Alendronate Sodium] Other (See Comments)    Pt didn't like the way it made her feel     Problem list She has Osteoporosis; DI (detrusor instability); Atrophic vaginitis; LBBB (left bundle branch block); CHB (complete heart block) ([place]); Vitamin D deficiency; Abnormal glucose; Essential hypertension; Hyperlipidemia, mixed;  Medication management; Overweight (BMI 25.0-29.9); Hypertrophic obstructive cardiomyopathy ([place]); GERD (gastroesophageal reflux disease); Major depressive disorder, recurrent episode, mild with anxious distress ([place]); Pacemaker; Paroxysmal atrial fibrillation ([place]); Chronic anticoagulation; Familial tremor; Episodic tension-type headache, not intractable; Cervical spinal meningocele (HCC); HOCM (hypertrophic obstructive cardiomyopathy) ([place]); Thoracic aortic aneurysm ([place]) by Chest CTA 12/2020; Former smoker; and FHx: heart disease on their problem list.   Social History:   reports that she quit smoking about 45 years ago. Her smoking use included cigarettes. She has never used smokeless tobacco. She reports that she does not drink alcohol and does not use drugs.  Observations/Objective:  General : Well sounding patient in no apparent distress HEENT: no hoarseness, no cough for duration of visit Lungs: speaks in complete sentences, no audible wheezing, no apparent distress Neurological: alert, oriented x 3 Psychiatric: pleasant, judgement appropriate   Assessment and Plan:  Covid 19 Covid 19 positive per rapid screening test at home Risk factors include:  Symptoms are: mild Immue support with vitamin D 1000 mg daily, zinc 50 mg daily, vitamin D Z-Pak, Decadron taper and Tessalon Perles q 6 hours PRN prescribed Reviewed doing regular breathing exercises, proning Take tylenol PRN temp 101+ Push hydration Regular ambulation or calf exercises exercises for clot prevention and 81 mg ASA unless contraindicated Sx supportive therapy suggested Follow up via mychart or telephone if needed Advised patient obtain O2 monitor; present to ED if persistently <88% or with severe dyspnea, CP, fever uncontrolled by tylenol, confusion, sudden decline Should remain in isolation until at least 5 days from onset of sx, 24-48 hours fever free without tylenol, sx such as cough are improved.      Follow Up  Instructions:  I discussed the assessment and treatment plan with the patient. The patient was provided an opportunity to ask questions and all were answered. The patient agreed with the plan and demonstrated an understanding of the instructions.   The patient was advised to call back or seek an in-person evaluation if the symptoms worsen or if the condition fails to improve as anticipated.  I provided 20 minutes of non-face-to-face time during this encounter.   Magda Bernheim, NP

## 2021-07-07 ENCOUNTER — Ambulatory Visit (INDEPENDENT_AMBULATORY_CARE_PROVIDER_SITE_OTHER): Payer: Medicare PPO

## 2021-07-07 DIAGNOSIS — I442 Atrioventricular block, complete: Secondary | ICD-10-CM | POA: Diagnosis not present

## 2021-07-13 LAB — CUP PACEART REMOTE DEVICE CHECK
Battery Remaining Longevity: 20 mo
Battery Remaining Percentage: 17 %
Battery Voltage: 2.87 V
Brady Statistic AP VP Percent: 96 %
Brady Statistic AP VS Percent: 1 %
Brady Statistic AS VP Percent: 4 %
Brady Statistic AS VS Percent: 1 %
Brady Statistic RA Percent Paced: 95 %
Brady Statistic RV Percent Paced: 99 %
Date Time Interrogation Session: 20220831032246
Implantable Lead Implant Date: 20140805
Implantable Lead Implant Date: 20140805
Implantable Lead Location: 753859
Implantable Lead Location: 753860
Implantable Lead Model: 1944
Implantable Lead Model: 1948
Implantable Pulse Generator Implant Date: 20140805
Lead Channel Impedance Value: 590 Ohm
Lead Channel Impedance Value: 630 Ohm
Lead Channel Pacing Threshold Amplitude: 0.5 V
Lead Channel Pacing Threshold Amplitude: 0.875 V
Lead Channel Pacing Threshold Pulse Width: 0.4 ms
Lead Channel Pacing Threshold Pulse Width: 0.4 ms
Lead Channel Sensing Intrinsic Amplitude: 12 mV
Lead Channel Sensing Intrinsic Amplitude: 3.5 mV
Lead Channel Setting Pacing Amplitude: 1.125
Lead Channel Setting Pacing Amplitude: 2 V
Lead Channel Setting Pacing Pulse Width: 0.4 ms
Lead Channel Setting Sensing Sensitivity: 4 mV
Pulse Gen Model: 2240
Pulse Gen Serial Number: 7523768

## 2021-07-20 ENCOUNTER — Other Ambulatory Visit: Payer: Self-pay | Admitting: Internal Medicine

## 2021-07-20 DIAGNOSIS — Z1231 Encounter for screening mammogram for malignant neoplasm of breast: Secondary | ICD-10-CM

## 2021-07-20 NOTE — Progress Notes (Signed)
Remote pacemaker transmission.   

## 2021-07-29 ENCOUNTER — Other Ambulatory Visit: Payer: Self-pay | Admitting: Internal Medicine

## 2021-07-29 DIAGNOSIS — R450 Nervousness: Secondary | ICD-10-CM

## 2021-08-10 ENCOUNTER — Other Ambulatory Visit: Payer: Self-pay | Admitting: Internal Medicine

## 2021-08-10 MED ORDER — HYDROCORTISONE ACETATE 1 % EX OINT: TOPICAL_OINTMENT | CUTANEOUS | 3 refills | Status: DC

## 2021-08-25 ENCOUNTER — Other Ambulatory Visit: Payer: Self-pay

## 2021-08-25 ENCOUNTER — Ambulatory Visit
Admission: RE | Admit: 2021-08-25 | Discharge: 2021-08-25 | Disposition: A | Discharge status: Home / Self Care | Payer: Medicare PPO | Source: Ambulatory Visit | Attending: Internal Medicine | Admitting: Internal Medicine

## 2021-08-25 DIAGNOSIS — Z1231 Encounter for screening mammogram for malignant neoplasm of breast: Secondary | ICD-10-CM | POA: Diagnosis not present

## 2021-09-17 ENCOUNTER — Telehealth: Payer: Self-pay

## 2021-09-17 NOTE — Telephone Encounter (Signed)
Patient returning call. Admits to not feeling well however unaware she was in AF. Burden remains <1%. Denies chest pain/pressure/palpitations, swelling, or shortness of breath. Reviewed with Dr. Johney Frame. No change in current treatment plan at this time. Patient will be scheduled for FU.

## 2021-09-17 NOTE — Telephone Encounter (Signed)
[facility] alert for exceed AT/AF AF event starting 11/11 @ 12:18 am and is ongoing Burden <1% Hx of PAF no OAC Route to triage LR  Unsuccessful telephone encounter to patient to follow up on s/s of AF. No OAC listed as patient's last CHA2DS2Vasc is documented as 4 and patient was did not have any recent episode at last visit which was 12/17/19. Patient well overdue and will be sent to scheduling for needed appointment. [employer] compliant VM message left requesting call back to [phone].

## 2021-09-20 NOTE — Progress Notes (Signed)
Cardiology Office Note Date:  09/20/2021  Patient ID:  Alexandra Wheeler, Alexandra Wheeler [birth year], MRN [id number] PCP:  Unk Pinto, MD  Cardiologist:  Dr. Aundra Dubin >> Dr. Johney Frame Electrophysiologist: Dr. Rayann Heman    Chief Complaint: overdue annual EP visit  History of Present Illness: Alexandra Wheeler is a 80 y.o. female with history of Afib, CHB w/PPM, HTN, HLD, HOCM.  She comes in today to be seen for Dr. Jonathon Bellows seen by him Feb 2020.  At that time doing well, AF burden <1%, no changes were made.  Discussed possibility of stopping a/c if burden remained low at her next visit   I saw her 12/17/2019 She is doing well.  Denies any CP, palpitations or cardiac awareness, no near syncope or syncope, no SOB She is not exercising but reports she stays moving and busy, recently moved, was very active packing, unpacking, cleaning without exertional intolerances AF burden was ,1% No changes were made  Most recently she saw Dr. Johney Frame 01/25/21, she was doing quite well, discussed recent issue with syncope felt 2/2 HCTZ and dehydration, mentioned Repeat TTE with LVEF 60-65%, no LVOT obstruction, mild SAM with moderate MR. She was exercising, no changes were made, planned to monitor serial echos.  TODAY She is doing well, turns 80 tomorrow. All in all she is doing OK She wishes she had more energy, feels tired most of the time Sometimes feels like she is a little lightheaded (like now with good BP and HR/rhythm), is a sense of just feeling "off" Once some months ago felt more clearly lightheaded and layed down in bed, felt like something just came over her and she was weak, did not faint and eventually just felt better She has not with these symptoms or otherwise had CP, palpitations or cardiac awareness. She has felt this way probably since she turned 64 a year now, suspects she is just aging.  She rarely uses the HCTZ, can not recall the last time she used it   Afib  hx Diagnosed Nov 2019 AAD None to date  Device information SJM dual chamber PPM, implanted 06/11/2013  Past Medical History:  Diagnosis Date   Anxiety    Atrophic vaginitis    Chest pain    a. Adenosine sestamibi (1/14) with no evidence of ischemia or infarction, EF 63%.    Colon polyps    Complete heart block ([place]) 06-2013   status post pacemaker implantation by Dr Rayann Heman 06-11-2013   Depression    DI (detrusor instability)    GERD (gastroesophageal reflux disease)    History + Hyplori via EGD   H/O echocardiogram    a. Echo 11/2012:EF 50-55%, focal basal septal hypertrophy with some mitral valve SAM (mild) and MR.  This may be a sigmoid septum with advanced age or could be a variant of hypertrophic cardiomyopathy.   HOCM (hypertrophic obstructive cardiomyopathy) ([place])    Echocardiogram 2/22: EF 60-65, no RWMA, severe asymmetric septal LVH, no LVOT, Gr 1 DD, mild SAM, mod MR, mild LAE, mod to severe TR, Asc Ao 42 mm, normal RVSF, RVSP 28   HTN (hypertension)    Hyperlipidemia    LBBB (left bundle branch block)    Leukopenia 05/19/2014   Lymphocytosis 05/19/2014   Migraines    Myoview    Myoview 12/21: EF 69, no ischemia or infarction; low risk    Osteoporosis    Paroxysmal atrial fibrillation ([place])    Prediabetes    Thoracic aortic aneurysm    Chest CTA  2/22: Ascending thoracic aortic aneurysm 44 mm; Ao arch 34 mm>>repeat 1 year   Vitamin D deficiency     Past Surgical History:  Procedure Laterality Date   BREAST BIOPSY Left 07/25/2019   ANGIOLIPOMA   BREAST EXCISIONAL BIOPSY Right    BREAST SURGERY     Breast cyst   CATARACT EXTRACTION Left 2019   Dr. Katy Fitch   HEMORRHOID SURGERY     LEFT HEART CATHETERIZATION WITH CORONARY ANGIOGRAM N/A 06/11/2013   Procedure: LEFT HEART CATHETERIZATION WITH CORONARY ANGIOGRAM;  Surgeon: Burnell Blanks, MD;  Location[facility] CATH LAB;  Service: Cardiovascular;  Laterality: N/A;   PACEMAKER INSERTION  06-11-2013   STJ dual chamber  pacemaker implanted by Dr Rayann Heman for complete heart block   PERMANENT PACEMAKER INSERTION Left 06/11/2013   Procedure: PERMANENT PACEMAKER INSERTION;  Surgeon: Burnell Blanks, MD;  Location: [facility] CATH LAB;  Service: Cardiovascular;  Laterality: Left;   PERMANENT PACEMAKER INSERTION N/A 06/11/2013   Procedure: PERMANENT PACEMAKER INSERTION;  Surgeon: Thompson Grayer, MD;  Location[facility] CATH LAB;  Service: Cardiovascular;  Laterality: N/A;   TEMPORARY PACEMAKER INSERTION N/A 06/11/2013   Procedure: TEMPORARY PACEMAKER INSERTION;  Surgeon: Burnell Blanks, MD;  Location: [facility] CATH LAB;  Service: Cardiovascular;  Laterality: N/A;   TUBAL LIGATION      Current Outpatient Medications  Medication Sig Dispense Refill   acetaminophen (TYLENOL) 500 MG tablet Take 1,000 mg by mouth in the morning and at bedtime.     Alcohol Swabs (B-D SINGLE USE SWABS REGULAR) PADS Use as directed for Glucose monitoring Daily 100 each 3   ALPRAZolam (XANAX) 1 MG tablet TAKE 1/2 TO 1 TABLET AT BEDTIME ONLY IF NEEDED FOR SLEEP. TRY LIMIT TO 5 DAYS PER WEEK TO AVOID ADDICTION 20 tablet 0   Ascorbic Acid (VITAMIN C PO) Take 1 tablet by mouth daily.      Blood Glucose Calibration (ACCU-CHEK GUIDE CONTROL) LIQD Use  as directed  to validate Daily  Blood Sugars 3 each 3   blood glucose meter kit and supplies KIT Check glucose once a day. 1 each 0   busPIRone (BUSPAR) 10 MG tablet Take  1/2 to 1 tablet  2 to 3 x /day  for Anxiety 270 tablet 0   Calcium Carbonate-Vitamin D (CALCIUM + D PO) Take 1 tablet by mouth daily.      Cholecalciferol (VITAMIN D PO) Take 5,000 Units by mouth 2 (two) times a day.      citalopram (CELEXA) 20 MG tablet TAKE 1 TABLET EVERY DAY FOR MOOD 90 tablet 3   dexamethasone (DECADRON) 1 MG tablet Take 3 tabs for 3 days, 2 tabs for 3 days 1 tab for 5 days. Take with food. 20 tablet 0   ezetimibe (ZETIA) 10 MG tablet Take 1 tablet Daily for Cholesterol 90 tablet 0   fluticasone (FLONASE) 50 MCG/ACT nasal  spray Place 2 sprays into both nostrils daily as needed for allergies or rhinitis.     FOLIC ACID PO Take 1 tablet by mouth daily.      hydrochlorothiazide (HYDRODIURIL) 25 MG tablet Take 25 mg by mouth daily as needed (swelling).      Hydrocortisone Acetate 1 % OINT Apply to Hemorrhoid 3 to 4  x /day 60 g 3   IBU 600 MG tablet TAKE 1 TABLET THREE TIMES DAILY WITH FOOD FOR INFLAMMATION AND PAIN 270 tablet 3   MAGNESIUM PO Take 500 mg by mouth daily.      metoprolol (TOPROL-XL)  200 MG 24 hr tablet TAKE 1 TABLET EVERY DAY FOR BLOOD PRESSURE AND HEART 90 tablet 3   Multiple Vitamin (MULTIVITAMIN) tablet Take 1 tablet by mouth daily.     pantoprazole (PROTONIX) 40 MG tablet Take 1 tablet Daily with Supper for Indigestion & Acid Reflux 90 tablet 3   Tretinoin, Facial Wrinkles, 0.05 % CREA APPLY TO AFFECTED AREA EVERY DAY AS DIRECTED 45 g 1   No current facility-administered medications for this visit.    Allergies:   Levsin [hyoscyamine sulfate], Mobic [meloxicam], Topamax [topiramate], Zoloft [sertraline hcl], Actonel [risedronate sodium], Boniva [ibandronic acid], Evista [raloxifene], and Fosamax [alendronate sodium]   Social History:  The patient  reports that she quit smoking about 45 years ago. Her smoking use included cigarettes. She has never used smokeless tobacco. She reports that she does not drink alcohol and does not use drugs.   Family History:  The patient's family history includes Aneurysm in her brother; Breast cancer (age of onset: 42) in her sister; Cancer in her father; Diabetes in her brother, brother, and mother; Heart disease in her brother, brother, and mother; Hypertension in her brother; Stroke in her brother.  ROS:  Please see the history of present illness.   All other systems are reviewed and otherwise negative.   PHYSICAL EXAM: VS:  There were no vitals taken for this visit. BMI: There is no height or weight on file to calculate BMI. Well nourished, well developed, in  no acute distress, looks younger then her age 34: normocephalic, atraumatic  Neck: no JVD, carotid bruits or masses Cardiac:  RRR; no significant murmurs, no rubs, or gallops Lungs:  CTA b/l, no wheezing, rhonchi or rales  Abd: soft, nontender MS: no deformity o atrophy Ext: no edema  Skin: warm and dry, no rash Neuro:  No gross deficits appreciated Psych: euthymic mood, full affect  PPM site is stable, no tenderness, fluctuation or tethering   EKG:  Not done today   PPM interrogation done today and reviewed by myself:  Battery estimate to ERI is 1.4years RA lead threshold is up from last year 1.75/0.4 1.5/0.8 1.5/1.0 Impedance and sensing stable RA lead output increased to 3.0/0.8 RV lead stable 95%AP 99%VP <1% AFib burden 7 episodes ONE was 09/20/21 and duration was a full day The others are quite brief, seconds, some are an AT ONE NSVT Jan 2022, 9 seconds   12/09/2020: TTE  1. Left ventricular ejection fraction, by estimation, is 60 to 65%. The  left ventricle has normal function. The left ventricle demonstrates  regional wall motion abnormalities with septal-lateral dyssynchrony. There  is severe asymmetric basal to mid  septal left ventricular hypertrophy. No LV outflow gradient. Left  ventricular diastolic parameters are consistent with Grade I diastolic  dysfunction (impaired relaxation).   2. The mitral valve shows mild systolic anterior motion. Moderate mitral  valve regurgitation. No evidence of mitral stenosis.   3. Left atrial size was mildly dilated.   4. Tricuspid valve regurgitation is moderate to severe.   5. The aortic valve is tricuspid. Aortic valve regurgitation is trivial.  No aortic stenosis is present.   6. Aortic dilatation noted. There is mild dilatation of the ascending  aorta, measuring 42 mm.   7. Right ventricular systolic function is normal. The right ventricular  size is mildly enlarged. There is normal pulmonary artery systolic   pressure. The estimated right ventricular systolic pressure is 62.2 mmHg.   8. The inferior vena cava is normal in size  with greater than 50%  respiratory variability, suggesting right atrial pressure of 3 mmHg.   9. This echo is consistent with hypertrophic cardiomyopathy.      11/05/2020: Stress myoview Nuclear stress EF: 69%. Blood pressure demonstrated a normal response to exercise. The study is normal. This is a low risk study. The left ventricular ejection fraction is hyperdynamic (>65%).   Negative nuclear medicine stress test for ischemia or infarction.  03/10/2017: TTE Study Conclusions  - Left ventricle: The cavity size was normal. There was severe    focal basal hypertrophy of the septum and moderate hypertrophy of    the remaining myocardium. Systolic function was normal. The    estimated ejection fraction was in the range of 60% to 65%. Wall    motion was normal; there were no regional wall motion    abnormalities. Doppler parameters are consistent with abnormal    left ventricular relaxation (grade 1 diastolic dysfunction).    Doppler parameters are consistent with elevated ventricular    end-diastolic filling pressure.  - Aortic valve: There was mild regurgitation.  - Mitral valve: There was trivial regurgitation.  - Right ventricle: The cavity size was normal. Wall thickness was    normal. Systolic function was normal.  - Right atrium: The atrium was normal in size.  - Tricuspid valve: There was moderate regurgitation.  - Pulmonary arteries: Systolic pressure was mildly increased. PA    peak pressure: 38 mm Hg (S).  - Inferior vena cava: The vessel was normal in size.  - Pericardium, extracardiac: There was no pericardial effusion.   Impressions:   - Findings consistent with HCM. No difference since the study on    07/31/2015.     11.30.2016: ETT Fair exercise capacity.  No chest pain.  Normal BP response to exercise (139/91 >> 170/99 >> 169/94). Paced  rhythm.  ECG could not be interpreted. There were frequent PVCs. No exercise induced ventricular arrhythmias.     Recent Labs: 01/08/2021: ALT 14; BUN 12; Creat 0.85; Hemoglobin 13.0; Magnesium 2.2; Platelets 201; Potassium 4.5; Sodium 143; TSH 1.08  01/08/2021: Cholesterol 183; HDL 47; LDL Cholesterol (Calc) 116; Total CHOL/HDL Ratio 3.9; Triglycerides 97   CrCl cannot be calculated (Patient's most recent lab result is older than the maximum 21 days allowed.).   Wt Readings from Last 3 Encounters:  02/20/21 160 lb (72.6 kg)  01/25/21 175 lb (79.4 kg)  01/08/21 165 lb 12.8 oz (75.2 kg)     Other studies reviewed: Additional studies/records reviewed today include: summarized above  ASSESSMENT AND PLAN:  1. PPM     Intact function     RA lead threshold up and programmed appropriately, discussed above  2. Paroxysmal Afib     CHA2DS2Vasc is 4, no longer on a/c      <1% AMS described above  We revisited AFib and stroke risk and anticoagulation For now, we will monitor burden closely, she is comfortable and agreeable to this strategy  3. HTN     Looks good  4. Generalized fatigue Unclear, she had a sense of feeling this way slightly here with stable vitals, HR, rhythm Mentions that her brother fet poorly his doctor finally did some testing and he was found with a cancer and died shortly afterwards She is encouraged to see and discuss her symptoms with her PMD Will get CMET, CBC today      Disposition: I will have her back in 86mo follow her closely and AFib burden   Current medicines  are reviewed at length with the patient today.  The patient did not have any concerns regarding medicines.  Venetia Night, PA-C 09/20/2021 9:43 AM     [address] [place] [address] [phone] (office)  [phone] (fax)

## 2021-09-23 ENCOUNTER — Encounter: Payer: Self-pay | Admitting: Physician Assistant

## 2021-09-23 ENCOUNTER — Other Ambulatory Visit: Payer: Self-pay

## 2021-09-23 ENCOUNTER — Ambulatory Visit: Payer: Medicare PPO | Admitting: Physician Assistant

## 2021-09-23 VITALS — BP 112/80 | HR 60 | Ht 65.0 in | Wt 164.2 lb

## 2021-09-23 DIAGNOSIS — R0602 Shortness of breath: Secondary | ICD-10-CM

## 2021-09-23 DIAGNOSIS — Z950 Presence of cardiac pacemaker: Secondary | ICD-10-CM

## 2021-09-23 DIAGNOSIS — I10 Essential (primary) hypertension: Secondary | ICD-10-CM

## 2021-09-23 DIAGNOSIS — R42 Dizziness and giddiness: Secondary | ICD-10-CM

## 2021-09-23 DIAGNOSIS — I480 Paroxysmal atrial fibrillation: Secondary | ICD-10-CM | POA: Diagnosis not present

## 2021-09-23 LAB — CUP PACEART INCLINIC DEVICE CHECK
Date Time Interrogation Session: 20221117234137
Implantable Lead Implant Date: 20140805
Implantable Lead Implant Date: 20140805
Implantable Lead Location: 753859
Implantable Lead Location: 753860
Implantable Lead Model: 1944
Implantable Lead Model: 1948
Implantable Pulse Generator Implant Date: 20140805
Lead Channel Pacing Threshold Amplitude: 0.75 V
Lead Channel Pacing Threshold Amplitude: 1.5 V
Lead Channel Pacing Threshold Pulse Width: 0.4 ms
Lead Channel Pacing Threshold Pulse Width: 0.8 ms
Lead Channel Sensing Intrinsic Amplitude: 2.7 mV
Pulse Gen Model: 2240
Pulse Gen Serial Number: 7523768

## 2021-09-23 NOTE — Patient Instructions (Signed)
Medication Instructions:    Your physician recommends that you continue on your current medications as directed. Please refer to the Current Medication list given to you today.  *If you need a refill on your cardiac medications before your next appointment, please call your pharmacy*   Lab Work:  CMET AND CBC TODAY    If you have labs (blood work) drawn today and your tests are completely normal, you will receive your results only by: MyChart Message (if you have MyChart) OR A paper copy in the mail If you have any lab test that is abnormal or we need to change your treatment, we will call you to review the results.   Testing/Procedures: NONE ORDERED  TODAY    Follow-Up: At [facility], you and your health needs are our priority.  As part of our continuing mission to provide you with exceptional heart care, we have created designated Provider Care Teams.  These Care Teams include your primary Cardiologist (physician) and Advanced Practice Providers (APPs -  Physician Assistants and Nurse Practitioners) who all work together to provide you with the care you need, when you need it.  We recommend signing up for the patient portal called "MyChart".  Sign up information is provided on this After Visit Summary.  MyChart is used to connect with patients for Virtual Visits (Telemedicine).  Patients are able to view lab/test results, encounter notes, upcoming appointments, etc.  Non-urgent messages can be sent to your provider as well.   To learn more about what you can do with MyChart, go to [email or web address].    Your next appointment:   3 month(s)  The format for your next appointment:   In Person  Provider:   You will see one of the following Advanced Practice Providers on your designated Care Team:   Francis Dowse, [place] Casimiro Needle "Otilio Saber, [place]  }    Other Instructions

## 2021-09-24 LAB — COMPREHENSIVE METABOLIC PANEL
ALT: 27 IU/L (ref 0–32)
AST: 26 IU/L (ref 0–40)
Albumin/Globulin Ratio: 2 (ref 1.2–2.2)
Albumin: 4.1 g/dL (ref 3.7–4.7)
Alkaline Phosphatase: 107 IU/L (ref 44–121)
BUN/Creatinine Ratio: 22 (ref 12–28)
BUN: 17 mg/dL (ref 8–27)
Bilirubin Total: <0.2 mg/dL (ref 0.0–1.2)
CO2: 25 mmol/L (ref 20–29)
Calcium: 9.4 mg/dL (ref 8.7–10.3)
Chloride: 103 mmol/L (ref 96–106)
Creatinine, Ser: 0.79 mg/dL (ref 0.57–1.00)
Globulin, Total: 2.1 g/dL (ref 1.5–4.5)
Glucose: 91 mg/dL (ref 70–99)
Potassium: 4.6 mmol/L (ref 3.5–5.2)
Sodium: 141 mmol/L (ref 134–144)
Total Protein: 6.2 g/dL (ref 6.0–8.5)
eGFR: 76 mL/min/{1.73_m2} (ref >59)

## 2021-09-24 LAB — CBC
Hematocrit: 39.7 % (ref 34.0–46.6)
Hemoglobin: 12.9 g/dL (ref 11.1–15.9)
MCH: 28.8 pg (ref 26.6–33.0)
MCHC: 32.5 g/dL (ref 31.5–35.7)
MCV: 89 fL (ref 79–97)
Platelets: 186 10*3/uL (ref 150–450)
RBC: 4.48 x10E6/uL (ref 3.77–5.28)
RDW: 12.9 % (ref 11.7–15.4)
WBC: 4.6 10*3/uL (ref 3.4–10.8)

## 2021-10-04 ENCOUNTER — Other Ambulatory Visit: Payer: Self-pay | Admitting: Internal Medicine

## 2021-10-04 DIAGNOSIS — R450 Nervousness: Secondary | ICD-10-CM

## 2021-10-04 MED ORDER — ALPRAZOLAM 1 MG PO TABS: ORAL_TABLET | ORAL | 0 refills | Status: DC

## 2021-10-06 ENCOUNTER — Ambulatory Visit (INDEPENDENT_AMBULATORY_CARE_PROVIDER_SITE_OTHER): Payer: Medicare PPO

## 2021-10-06 DIAGNOSIS — I442 Atrioventricular block, complete: Secondary | ICD-10-CM

## 2021-10-06 LAB — CUP PACEART REMOTE DEVICE CHECK
Battery Remaining Longevity: 10 mo
Battery Remaining Percentage: 14 %
Battery Voltage: 2.83 V
Brady Statistic AP VP Percent: 99 %
Brady Statistic AP VS Percent: 1 %
Brady Statistic AS VP Percent: 1 %
Brady Statistic AS VS Percent: 1 %
Brady Statistic RA Percent Paced: 99 %
Brady Statistic RV Percent Paced: 99 %
Date Time Interrogation Session: 20221130020013
Implantable Lead Implant Date: 20140805
Implantable Lead Implant Date: 20140805
Implantable Lead Location: 753859
Implantable Lead Location: 753860
Implantable Lead Model: 1944
Implantable Lead Model: 1948
Implantable Pulse Generator Implant Date: 20140805
Lead Channel Impedance Value: 560 Ohm
Lead Channel Impedance Value: 610 Ohm
Lead Channel Pacing Threshold Amplitude: 0.75 V
Lead Channel Pacing Threshold Amplitude: 1.5 V
Lead Channel Pacing Threshold Pulse Width: 0.4 ms
Lead Channel Pacing Threshold Pulse Width: 1 ms
Lead Channel Sensing Intrinsic Amplitude: 12 mV
Lead Channel Sensing Intrinsic Amplitude: 3 mV
Lead Channel Setting Pacing Amplitude: 1 V
Lead Channel Setting Pacing Amplitude: 3 V
Lead Channel Setting Pacing Pulse Width: 0.4 ms
Lead Channel Setting Sensing Sensitivity: 4 mV
Pulse Gen Model: 2240
Pulse Gen Serial Number: 7523768

## 2021-10-15 NOTE — Progress Notes (Signed)
Remote pacemaker transmission.   

## 2021-11-04 ENCOUNTER — Encounter: Payer: Self-pay | Admitting: Adult Health

## 2021-11-04 ENCOUNTER — Other Ambulatory Visit: Payer: Self-pay

## 2021-11-04 ENCOUNTER — Ambulatory Visit (INDEPENDENT_AMBULATORY_CARE_PROVIDER_SITE_OTHER): Payer: Medicare PPO | Admitting: Adult Health

## 2021-11-04 VITALS — BP 116/70 | HR 60 | Temp 97.3°F | Wt 165.0 lb

## 2021-11-04 DIAGNOSIS — H9201 Otalgia, right ear: Secondary | ICD-10-CM | POA: Diagnosis not present

## 2021-11-04 DIAGNOSIS — M26621 Arthralgia of right temporomandibular joint: Secondary | ICD-10-CM | POA: Diagnosis not present

## 2021-11-04 DIAGNOSIS — K121 Other forms of stomatitis: Secondary | ICD-10-CM

## 2021-11-04 DIAGNOSIS — H6121 Impacted cerumen, right ear: Secondary | ICD-10-CM

## 2021-11-04 DIAGNOSIS — J302 Other seasonal allergic rhinitis: Secondary | ICD-10-CM | POA: Diagnosis not present

## 2021-11-04 MED ORDER — DEXAMETHASONE SODIUM PHOSPHATE 100 MG/10ML IJ SOLN
10.0000 mg | Freq: Once | INTRAMUSCULAR | Status: AC
  Administered 2021-11-04: 12:00:00 10 mg via INTRAMUSCULAR

## 2021-11-04 MED ORDER — VALACYCLOVIR HCL 500 MG PO TABS: 500.0000 mg | ORAL_TABLET | Freq: Two times a day (BID) | ORAL | 0 refills | Status: AC

## 2021-11-04 MED ORDER — NYSTATIN 100000 UNIT/ML MT SUSP: OROMUCOSAL | 0 refills | Status: DC

## 2021-11-04 NOTE — Progress Notes (Signed)
Assessment and Plan:  Quaneshia was seen today for oral pain.  Diagnoses and all orders for this visit:  Oral ulcer - Mouth wash of hydrocortisone nystatin and  diphenhydramine sent in to compounding pharmacy, 1 tsp swish and spit q2h PRN -     valACYclovir (VALTREX) 500 MG tablet; Take 1 tablet (500 mg total) by mouth 2 (two) times daily for 5 days. - avoid pineapple, citrus, spicy foods, alcohol for several weeks after resolving - follow up if not resolving   Hearing loss of right ear due to cerumen impaction - stop using Qtips, irrigation used in the office without complications, use OTC drops/oil at home to prevent reoccurence  Seasonal allergic rhinitis, unspecified trigger - Start daily H2i, hygiene reviewed, saline nasal irrigations  Tenderness of right temporomandibular joint information given to the patient, no gum/decrease hard foods, warm wet wash clothes, decrease stress,  can do massage, and exercise.  Very important to follow up with dentist  Right ear pain -     dexamethasone (DECADRON) injection 10 mg   Further disposition pending results of labs. Discussed med's effects and SE's.   Over 30 minutes of exam, counseling, chart review, and critical decision making was performed.   Future Appointments  Date Time Provider Department Center  12/17/2021 11:30 AM [facility]  12/28/2021 10:30 AM Sheilah Pigeon, PA-C [facility]  01/05/2022  8:10 AM [facility] DEVICE REMOTES [facility]  01/17/2022  2:00 PM Lucky Cowboy, MD [facility] None  04/06/2022  8:10 AM [facility] DEVICE REMOTES [facility]    ------------------------------------------------------------------------------------------------------------------   HPI BP 116/70    Pulse 60    Temp (!) 97.3 F (36.3 C)    Wt 165 lb (74.8 kg)    SpO2 99%    BMI 27.46 kg/m  80 y.o.female presents for evaluation of mouth soreness x 3 weeks, nasal/ear fullness,  reduced hearing in R ear with some tenderness/pain.   She reports in the last 2-3 weeks has had persistent soreness on palate, sense of sores/ulcers. Started after eating pineapple. Also notes hx of cold sores. Denies recent topical steroid use. Has been gargling lemon water, some improvement but not resolving.   She also notes fullness/tenderness in bil ears, nasal congestion, coughing up clear mucus with some blood this morning and was concerned. Not sure if from nose. Also notes decreased hearing, R ear pressure/mild pain. Denies fever/chills. Does note sneezing, watery/itchy eyes. Reports hx of mild allergies, doesn't typically treat.    Past Medical History:  Diagnosis Date   Anxiety    Atrophic vaginitis    Chest pain    a. Adenosine sestamibi (1/14) with no evidence of ischemia or infarction, EF 63%.    Colon polyps    Complete heart block (HCC) 06-2013   status post pacemaker implantation by Dr Johney Frame 06-11-2013   Depression    DI (detrusor instability)    GERD (gastroesophageal reflux disease)    History + Hyplori via EGD   H/O echocardiogram    a. Echo 11/2012:EF 50-55%, focal basal septal hypertrophy with some mitral valve SAM (mild) and MR.  This may be a sigmoid septum with advanced age or could be a variant of hypertrophic cardiomyopathy.   HOCM (hypertrophic obstructive cardiomyopathy) (HCC)    Echocardiogram 2/22: EF 60-65, no RWMA, severe asymmetric septal LVH, no LVOT, Gr 1 DD, mild SAM, mod MR, mild LAE, mod to severe TR, Asc Ao 42 mm, normal RVSF, RVSP 28   HTN (hypertension)  Hyperlipidemia    LBBB (left bundle branch block)    Leukopenia 05/19/2014   Lymphocytosis 05/19/2014   Migraines    Myoview    Myoview 12/21: EF 69, no ischemia or infarction; low risk    Osteoporosis    Paroxysmal atrial fibrillation (HCC)    Prediabetes    Thoracic aortic aneurysm    Chest CTA 2/22: Ascending thoracic aortic aneurysm 44 mm; Ao arch 34 mm>>repeat 1 year   Vitamin D  deficiency      Allergies  Allergen Reactions   Levsin [Hyoscyamine Sulfate]     unknown   Mobic [Meloxicam]     unknown    Topamax [Topiramate]     unknown    Zoloft [Sertraline Hcl]     unknown    Actonel [Risedronate Sodium] Other (See Comments)    Pt didn't like the way it made her feel    Boniva [Ibandronic Acid] Other (See Comments)    Pt didn't like the way it made her feel   Evista [Raloxifene] Other (See Comments)    Pt didn't like the way it made her feel   Fosamax [Alendronate Sodium] Other (See Comments)    Pt didn't like the way it made her feel     Current Outpatient Medications on File Prior to Visit  Medication Sig   acetaminophen (TYLENOL) 500 MG tablet Take 1,000 mg by mouth in the morning and at bedtime.   Alcohol Swabs (B-D SINGLE USE SWABS REGULAR) PADS Use as directed for Glucose monitoring Daily   ALPRAZolam (XANAX) 1 MG tablet TAKE 1/2 TO 1 TABLET AT BEDTIME ONLY IF NEEDED FOR SLEEP. TRY LIMIT TO 5 DAYS PER WEEK TO AVOID ADDICTION   Ascorbic Acid (VITAMIN C PO) Take 1 tablet by mouth daily.    busPIRone (BUSPAR) 10 MG tablet Take  1/2 to 1 tablet  2 to 3 x /day  for Anxiety   Calcium Carbonate-Vitamin D (CALCIUM + D PO) Take 1 tablet by mouth daily.    Cholecalciferol (VITAMIN D PO) Take 5,000 Units by mouth 2 (two) times a day.    citalopram (CELEXA) 20 MG tablet TAKE 1 TABLET EVERY DAY FOR MOOD   FOLIC ACID PO Take 1 tablet by mouth daily.    hydrochlorothiazide (HYDRODIURIL) 25 MG tablet Take 25 mg by mouth daily as needed (swelling).    Hydrocortisone Acetate 1 % OINT Apply to Hemorrhoid 3 to 4  x /day   IBU 600 MG tablet TAKE 1 TABLET THREE TIMES DAILY WITH FOOD FOR INFLAMMATION AND PAIN   MAGNESIUM PO Take 500 mg by mouth daily.    metoprolol (TOPROL-XL) 200 MG 24 hr tablet TAKE 1 TABLET EVERY DAY FOR BLOOD PRESSURE AND HEART   Multiple Vitamin (MULTIVITAMIN) tablet Take 1 tablet by mouth daily.   pantoprazole (PROTONIX) 40 MG tablet Take 1  tablet Daily with Supper for Indigestion & Acid Reflux   Tretinoin, Facial Wrinkles, 0.05 % CREA APPLY TO AFFECTED AREA EVERY DAY AS DIRECTED   No current facility-administered medications on file prior to visit.   Allergies:  Allergies  Allergen Reactions   Levsin [Hyoscyamine Sulfate]     unknown   Mobic [Meloxicam]     unknown    Topamax [Topiramate]     unknown    Zoloft [Sertraline Hcl]     unknown    Actonel [Risedronate Sodium] Other (See Comments)    Pt didn't like the way it made her feel    Boniva [Ibandronic  Acid] Other (See Comments)    Pt didn't like the way it made her feel   Evista [Raloxifene] Other (See Comments)    Pt didn't like the way it made her feel   Fosamax [Alendronate Sodium] Other (See Comments)    Pt didn't like the way it made her feel    Surgical History:  She  has a past surgical history that includes Breast surgery; Tubal ligation; Hemorrhoid surgery; Pacemaker insertion (06-11-2013); left heart catheterization with coronary angiogram (N/A, 06/11/2013); temporary pacemaker insertion (N/A, 06/11/2013); permanent pacemaker insertion (Left, 06/11/2013); permanent pacemaker insertion (N/A, 06/11/2013); Cataract extraction (Left, 2019); Breast excisional biopsy (Right); and Breast biopsy (Left, 07/25/2019). Family History:  Herfamily history includes Aneurysm in her brother; Breast cancer (age of onset: 77) in her sister; Cancer in her father; Diabetes in her brother, brother, and mother; Heart disease in her brother, brother, and mother; Hypertension in her brother; Stroke in her brother. Social History:   reports that she quit smoking about 46 years ago. Her smoking use included cigarettes. She has never used smokeless tobacco. She reports that she does not drink alcohol and does not use drugs.    ROS: all negative except above.   Physical Exam:  BP 116/70    Pulse 60    Temp (!) 97.3 F (36.3 C)    Wt 165 lb (74.8 kg)    SpO2 99%    BMI 27.46 kg/m    General Appearance: Well nourished, well dressed elder female in no apparent distress. Eyes: PERRLA, conjunctiva no swelling or erythema Sinuses: No Frontal/maxillary tenderness ENT/Mouth: L Ext aud canal clear, TM without erythema, bulging. R canal obstructed by wax. No erythema, swelling, or exudate on post pharynx. Soft palate with small sore/ulcer to midline, subtle soft tissue swelling immediately surrounding. No white coating, other erythema or sores. Fair dentition/oral hygiene. No tongue coating. Tonsils not swollen or erythematous. Hearing mildly decreased, R <L. Also mild TMJ tenderness without popping, clicking, good ROM.  Neck: Supple] Respiratory: Respiratory effort normal, BS equal bilaterally without rales, rhonchi, wheezing or stridor.  Cardio: RRR with no MRGs. Brisk peripheral pulses without edema.  Lymphatics: Non tender without lymphadenopathy.  Musculoskeletal: normal gait. + TMJ tenderness, R > L.  Skin: Warm, dry without rashes, lesions, ecchymosis.  Neuro: Cranial nerves intact. Normal muscle tone, no cerebellar symptoms. Sensation intact.  Psych: Awake and oriented X 3, normal affect, Insight and Judgment appropriate.     Dan Maker, NP 12:12 PM [facility]

## 2021-11-04 NOTE — Patient Instructions (Addendum)
Star antiviral (valcyclovir) and mouth wash for oral sore - sent in to [facility]   Avoid pineapple, citrus, spicy foods, alcohol for several weeks after resolution   Recommend starting on daily allegra, zyrtec or claritin - for congestion, watery/itchy eyes, sneezing (allergy symptoms)   Saline nasal irrigations if needed Coricidin HBP can help congestion, safe with high blood pressure   For ear wax:   Use a dropper or use a cap to put peroxide, olive oil,mineral oil or canola oil in the effected ear- 2-3 times a week. Let it soak for 20-30 min then you can take a shower or use a baby bulb with warm water to wash out the ear wax.  Can buy debrox wax removal kit over the counter.  Do not use Qtips       What is the TMJ? The temporomandibular (tem-PUH-ro-man-DIB-yoo-ler) joint, or the TMJ, connects the upper and lower jawbones. This joint allows the jaw to open wide and move back and forth when you chew, talk, or yawn.There are also several muscles that help this joint move. There can be muscle tightness and pain in the muscle that can cause several symptoms.  What causes TMJ pain? There are many causes of TMJ pain. Repeated chewing (for example, chewing gum) and clenching your teeth can cause pain in the joint. Some TMJ pain has no obvious cause. What can I do to ease the pain? There are many things you can do to help your pain get better. When you have pain:  Eat soft foods and stay away from chewy foods (for example, taffy) Try to use both sides of your mouth to chew Don't chew gum Massage Don't open your mouth wide (for example, during yawning or singing) Don't bite your cheeks or fingernails Lower your amount of stress and worry Applying a warm, damp washcloth to the joint may help. Over-the-counter pain medicines such as ibuprofen (one brand: Advil) or acetaminophen (one brand: Tylenol) might also help. Do not use these medicines if you are allergic to them or if  your doctor told you not to use them. How can I stop the pain from coming back? When your pain is better, you can do these exercises to make your muscles stronger and to keep the pain from coming back:  Resisted mouth opening: Place your thumb or two fingers under your chin and open your mouth slowly, pushing up lightly on your chin with your thumb. Hold for three to six seconds. Close your mouth slowly. Resisted mouth closing: Place your thumbs under your chin and your two index fingers on the ridge between your mouth and the bottom of your chin. Push down lightly on your chin as you close your mouth. Tongue up: Slowly open and close your mouth while keeping the tongue touching the roof of the mouth. Side-to-side jaw movement: Place an object about one fourth of an inch thick (for example, two tongue depressors) between your front teeth. Slowly move your jaw from side to side. Increase the thickness of the object as the exercise becomes easier Forward jaw movement: Place an object about one fourth of an inch thick between your front teeth and move the bottom jaw forward so that the bottom teeth are in front of the top teeth. Increase the thickness of the object as the exercise becomes easier. These exercises should not be painful. If it hurts to do these exercises, stop doing them and talk to your family doctor.

## 2021-11-05 ENCOUNTER — Encounter: Payer: Self-pay | Admitting: Adult Health

## 2021-11-05 ENCOUNTER — Telehealth: Payer: Self-pay

## 2021-11-05 NOTE — Telephone Encounter (Signed)
Patient states that she doesn't want to take the Valtrex, read the side effects.

## 2021-11-16 ENCOUNTER — Other Ambulatory Visit: Payer: Self-pay | Admitting: Internal Medicine

## 2021-11-16 DIAGNOSIS — R450 Nervousness: Secondary | ICD-10-CM

## 2021-11-18 ENCOUNTER — Other Ambulatory Visit: Payer: Self-pay | Admitting: Internal Medicine

## 2021-11-30 ENCOUNTER — Other Ambulatory Visit: Payer: Self-pay

## 2021-11-30 ENCOUNTER — Ambulatory Visit (INDEPENDENT_AMBULATORY_CARE_PROVIDER_SITE_OTHER): Payer: Medicare PPO | Admitting: Adult Health

## 2021-11-30 ENCOUNTER — Encounter: Payer: Self-pay | Admitting: Adult Health

## 2021-11-30 VITALS — BP 106/80 | HR 69 | Temp 97.2°F | Wt 165.0 lb

## 2021-11-30 DIAGNOSIS — J0100 Acute maxillary sinusitis, unspecified: Secondary | ICD-10-CM | POA: Diagnosis not present

## 2021-11-30 DIAGNOSIS — H9203 Otalgia, bilateral: Secondary | ICD-10-CM | POA: Diagnosis not present

## 2021-11-30 DIAGNOSIS — M26623 Arthralgia of bilateral temporomandibular joint: Secondary | ICD-10-CM | POA: Diagnosis not present

## 2021-11-30 MED ORDER — AMOXICILLIN-POT CLAVULANATE 875-125 MG PO TABS: 1.0000 | ORAL_TABLET | Freq: Two times a day (BID) | ORAL | 0 refills | Status: AC

## 2021-11-30 NOTE — Progress Notes (Signed)
Assessment and Plan:  Alexandra Wheeler was seen today for referral.  Diagnoses and all orders for this visit:  Tenderness of both temporomandibular joints Discussed at length; Encouraged her to start wearing her mouth/bite guard daily information given to the patient, no gum/decrease hard foods, warm wet wash clothes, decrease stress,  can do massage, and exercise.  Very important to follow up with dentist Can also refer to PT which could be beneficial  Ear pain, bilateral Benign ear exam, suspect most likely secondary to TMJ, see plan above.   Subacute maxillary sinusitis Possible, after discussion will proceed with first line treatment for sinusitis, but sinus/upper jaw pain could also be related to TMJ Encouraged to increase hydration, suggested humidifier and saline nasal sprays for dry nasal passageways Follow up if persistent/new concerns -     amoxicillin-clavulanate (AUGMENTIN) 875-125 MG tablet; Take 1 tablet by mouth 2 (two) times daily for 7 days.   Further disposition pending results of labs. Discussed med's effects and SE's.   Over 30 minutes of exam, counseling, chart review, and critical decision making was performed.   Future Appointments  Date Time Provider Department Center  12/17/2021 11:30 AM [facility] 1 [facility]  12/28/2021 10:30 AM Sheilah PigeonUrsuy, Renee Lynn, PA-C [facility]  01/05/2022  8:10 AM [facility] DEVICE REMOTES [facility]  01/17/2022  2:00 PM Lucky CowboyMcKeown, William, MD [facility] None  04/06/2022  8:10 AM [facility] DEVICE REMOTES [facility]    ------------------------------------------------------------------------------------------------------------------   HPI BP 106/80    Pulse 69    Temp (!) 97.2 F (36.2 C)    Wt 165 lb (74.8 kg)    SpO2 99%    BMI 27.46 kg/m  81 y.o.female with TMJ, chronic rhinitis presents for evaluation of bil ear pain, sinus sx.   She reports rhinitis (chronic, years, doesn't treat), more  nasal congestion, aching in upper jaw/teeth, new snoring at night per her daughter, has had some sore throat (worst in AM then improves with some tylenol), bil ear pain with some radiation down neck.   Also reports was doing nasal swab for covid 19 at home with daughter, had unusual mucoid discharge with blood at that time and daughter wanted for her to be seen. Denies fever/chills. Headaches consistent with baseline resolve with tylenol.   She was seen for similar 11/04/2021, had + TMJ bil at that time, hasn't followed up with dentist but states has been dx with this previous, has mouth guard but hasn't been wearing.   Past Medical History:  Diagnosis Date   Anxiety    Atrophic vaginitis    Chest pain    a. Adenosine sestamibi (1/14) with no evidence of ischemia or infarction, EF 63%.    Colon polyps    Complete heart block (HCC) 06-2013   status post pacemaker implantation by Dr Johney FrameAllred 06-11-2013   Depression    DI (detrusor instability)    GERD (gastroesophageal reflux disease)    History + Hyplori via EGD   H/O echocardiogram    a. Echo 11/2012:EF 50-55%, focal basal septal hypertrophy with some mitral valve SAM (mild) and MR.  This may be a sigmoid septum with advanced age or could be a variant of hypertrophic cardiomyopathy.   HOCM (hypertrophic obstructive cardiomyopathy) (HCC)    Echocardiogram 2/22: EF 60-65, no RWMA, severe asymmetric septal LVH, no LVOT, Gr 1 DD, mild SAM, mod MR, mild LAE, mod to severe TR, Asc Ao 42 mm, normal RVSF, RVSP 28   HTN (hypertension)    Hyperlipidemia  LBBB (left bundle branch block)    Leukopenia 05/19/2014   Lymphocytosis 05/19/2014   Migraines    Myoview    Myoview 12/21: EF 69, no ischemia or infarction; low risk    Osteoporosis    Paroxysmal atrial fibrillation (HCC)    Prediabetes    Thoracic aortic aneurysm    Chest CTA 2/22: Ascending thoracic aortic aneurysm 44 mm; Ao arch 34 mm>>repeat 1 year   Vitamin D deficiency      Allergies   Allergen Reactions   Levsin [Hyoscyamine Sulfate]     unknown   Mobic [Meloxicam]     unknown    Topamax [Topiramate]     unknown    Zoloft [Sertraline Hcl]     unknown    Actonel [Risedronate Sodium] Other (See Comments)    Pt didn't like the way it made her feel    Boniva [Ibandronic Acid] Other (See Comments)    Pt didn't like the way it made her feel   Evista [Raloxifene] Other (See Comments)    Pt didn't like the way it made her feel   Fosamax [Alendronate Sodium] Other (See Comments)    Pt didn't like the way it made her feel     Current Outpatient Medications on File Prior to Visit  Medication Sig   acetaminophen (TYLENOL) 500 MG tablet Take 1,000 mg by mouth in the morning and at bedtime.   Alcohol Swabs (B-D SINGLE USE SWABS REGULAR) PADS Use as directed for Glucose monitoring Daily   ALPRAZolam (XANAX) 1 MG tablet TAKE 1/2 TO 1 TABLET AT BEDTIME ONLY IF NEEDED FOR SLEEP. TRY LIMIT TO 5 DAYS PER WEEK TO AVOID ADDICTION   Ascorbic Acid (VITAMIN C PO) Take 1 tablet by mouth daily.    busPIRone (BUSPAR) 10 MG tablet Take  1/2 to 1 tablet  2 to 3 x /day  for Anxiety   Calcium Carbonate-Vitamin D (CALCIUM + D PO) Take 1 tablet by mouth daily.    Cholecalciferol (VITAMIN D PO) Take 5,000 Units by mouth 2 (two) times a day.    citalopram (CELEXA) 20 MG tablet TAKE 1 TABLET EVERY DAY FOR MOOD   FOLIC ACID PO Take 1 tablet by mouth daily.    hydrochlorothiazide (HYDRODIURIL) 25 MG tablet Take 25 mg by mouth daily as needed (swelling).    Hydrocortisone Acetate 1 % OINT Apply to Hemorrhoid 3 to 4  x /day   IBU 600 MG tablet TAKE 1 TABLET THREE TIMES DAILY WITH FOOD FOR INFLAMMATION AND PAIN   MAGNESIUM PO Take 500 mg by mouth daily.    metoprolol (TOPROL-XL) 200 MG 24 hr tablet TAKE 1 TABLET EVERY DAY FOR BLOOD PRESSURE AND HEART   Multiple Vitamin (MULTIVITAMIN) tablet Take 1 tablet by mouth daily.   pantoprazole (PROTONIX) 40 MG tablet Take 1 tablet Daily with Supper for  Indigestion & Acid Reflux   Tretinoin, Facial Wrinkles, 0.05 % CREA APPLY TO AFFECTED AREA EVERY DAY AS DIRECTED   magic mouthwash (nystatin, hydrocortisone, diphenhydrAMINE) suspension 1 tsp swish and spit every 2 hours as needed. (Patient not taking: Reported on 11/30/2021)   No current facility-administered medications on file prior to visit.    ROS: all negative except above.   Physical Exam:  BP 106/80    Pulse 69    Temp (!) 97.2 F (36.2 C)    Wt 165 lb (74.8 kg)    SpO2 99%    BMI 27.46 kg/m   General Appearance: Well nourished,  in no apparent distress. Eyes: PERRLA, EOMs, conjunctiva no swelling or erythema Sinuses: No Frontal/maxillary tenderness but mildly puffy bil cheeks ENT/Mouth: Ext aud canals clear, TMs without erythema, bulging. No erythema, swelling, or exudate on post pharynx.  Tonsils not swollen or erythematous. Hearing normal. Fair dentition without obvious abscess or cary. + bil TMJ tenderness without popping, dislocation. Tenderness bil buccal muscles.  Neck: Supple, thyroid normal.  Respiratory: Respiratory effort normal, BS equal bilaterally without rales, rhonchi, wheezing or stridor.  Cardio: RRR with no MRGs. Brisk peripheral pulses without edema.  Abdomen: Soft, + BS.  Non tender, no guarding, rebound, hernias, masses. Lymphatics: Non tender without lymphadenopathy.  Musculoskeletal: normal gait.  Skin: Warm, dry without rashes, lesions, ecchymosis.  Neuro: Cranial nerves intact. Normal muscle tone Psych: Awake and oriented X 3, normal affect, Insight and Judgment appropriate.     Dan Maker, NP 10:46 AM [facility]

## 2021-11-30 NOTE — Patient Instructions (Signed)
Humidifier and regular saline nasal sprays  Treating for sinusitis to be sure with augmentin  Looks like most of your symptoms are related to TMJ - please start your bite guard, ok to do tylenol, please follow up with dentist     What is the TMJ? The temporomandibular (tem-PUH-ro-man-DIB-yoo-ler) joint, or the TMJ, connects the upper and lower jawbones. This joint allows the jaw to open wide and move back and forth when you chew, talk, or yawn.There are also several muscles that help this joint move. There can be muscle tightness and pain in the muscle that can cause several symptoms.  What causes TMJ pain? There are many causes of TMJ pain. Repeated chewing (for example, chewing gum) and clenching your teeth can cause pain in the joint. Some TMJ pain has no obvious cause. What can I do to ease the pain? There are many things you can do to help your pain get better. When you have pain:  Eat soft foods and stay away from chewy foods (for example, taffy) Try to use both sides of your mouth to chew Don't chew gum Massage Don't open your mouth wide (for example, during yawning or singing) Don't bite your cheeks or fingernails Lower your amount of stress and worry Applying a warm, damp washcloth to the joint may help. Over-the-counter pain medicines such as ibuprofen (one brand: Advil) or acetaminophen (one brand: Tylenol) might also help. Do not use these medicines if you are allergic to them or if your doctor told you not to use them. How can I stop the pain from coming back? When your pain is better, you can do these exercises to make your muscles stronger and to keep the pain from coming back:  Resisted mouth opening: Place your thumb or two fingers under your chin and open your mouth slowly, pushing up lightly on your chin with your thumb. Hold for three to six seconds. Close your mouth slowly. Resisted mouth closing: Place your thumbs under your chin and your two index fingers on the  ridge between your mouth and the bottom of your chin. Push down lightly on your chin as you close your mouth. Tongue up: Slowly open and close your mouth while keeping the tongue touching the roof of the mouth. Side-to-side jaw movement: Place an object about one fourth of an inch thick (for example, two tongue depressors) between your front teeth. Slowly move your jaw from side to side. Increase the thickness of the object as the exercise becomes easier Forward jaw movement: Place an object about one fourth of an inch thick between your front teeth and move the bottom jaw forward so that the bottom teeth are in front of the top teeth. Increase the thickness of the object as the exercise becomes easier. These exercises should not be painful. If it hurts to do these exercises, stop doing them and talk to your family doctor.          Amoxicillin; Clavulanic Acid Tablets What is this medication? AMOXICILLIN; CLAVULANIC ACID (a mox i SIL in; KLAV yoo lan ic AS id) treats infections caused by bacteria. It belongs to a group of medications called penicillin antibiotics. It will not treat colds, the flu, or infections caused by viruses. This medicine may be used for other purposes; ask your health care provider or pharmacist if you have questions. COMMON BRAND NAME(S): Augmentin What should I tell my care team before I take this medication? They need to know if you have any of  these conditions: Kidney disease Liver disease Mononucleosis Stomach or intestine problems such as colitis An unusual or allergic reaction to amoxicillin, other penicillin or cephalosporin antibiotics, clavulanic acid, other medications, foods, dyes, or preservatives Pregnant or trying to get pregnant Breast-feeding How should I use this medication? Take this medication by mouth. Take it as directed on the prescription label at the same time every day. Take it with food at the start of a meal or snack. Take all of this  medication unless your care team tells you to stop it early. Keep taking it even if you think you are better. Talk to your care team about the use of this medication in children. While it may be prescribed for selected conditions, precautions do apply. Overdosage: If you think you have taken too much of this medicine contact a poison control center or emergency room at once. NOTE: This medicine is only for you. Do not share this medicine with others. What if I miss a dose? If you miss a dose, take it as soon as you can. If it is almost time for your next dose, take only that dose. Do not take double or extra doses. What may interact with this medication? Allopurinol Anticoagulants Birth control pills Methotrexate Probenecid This list may not describe all possible interactions. Give your health care provider a list of all the medicines, herbs, non-prescription drugs, or dietary supplements you use. Also tell them if you smoke, drink alcohol, or use illegal drugs. Some items may interact with your medicine. What should I watch for while using this medication? Tell your care team if your symptoms do not start to get better or if they get worse. This medication may cause serious skin reactions. They can happen weeks to months after starting the medication. Contact your care team right away if you notice fevers or flu-like symptoms with a rash. The rash may be red or purple and then turn into blisters or peeling of the skin. Or, you might notice a red rash with swelling of the face, lips or lymph nodes in your neck or under your arms. Do not treat diarrhea with over the counter products. Contact your care team if you have diarrhea that lasts more than 2 days or if it is severe and watery. If you have diabetes, you may get a false-positive result for sugar in your urine. Check with your care team. Birth control may not work properly while you are taking this medication. Talk to your care team about using  an extra method of birth control. What side effects may I notice from receiving this medication? Side effects that you should report to your care team as soon as possible: Allergic reactions--skin rash, itching, hives, swelling of the face, lips, tongue, or throat Liver injury--right upper belly pain, loss of appetite, nausea, light-colored stool, dark yellow or brown urine, yellowing skin or eyes, unusual weakness or fatigue Redness, blistering, peeling, or loosening of the skin, including inside the mouth Severe diarrhea, fever Unusual vaginal discharge, itching, or odor Side effects that usually do not require medical attention (report to your care team if they continue or are bothersome): Diarrhea Nausea Vomiting This list may not describe all possible side effects. Call your doctor for medical advice about side effects. You may report side effects to FDA at 1-800-FDA-1088. Where should I keep my medication? Keep out of the reach of children and pets. Store at room temperature between 20 and 25 degrees C (68 and 77 degrees F). Throw  away any unused medication after the expiration date. NOTE: This sheet is a summary. It may not cover all possible information. If you have questions about this medicine, talk to your doctor, pharmacist, or health care provider.  2022 Elsevier/Gold Standard (2020-10-18 00:00:00)

## 2021-12-13 ENCOUNTER — Telehealth: Payer: Self-pay | Admitting: *Deleted

## 2021-12-13 DIAGNOSIS — I77810 Thoracic aortic ectasia: Secondary | ICD-10-CM

## 2021-12-13 DIAGNOSIS — I712 Thoracic aortic aneurysm, without rupture, unspecified: Secondary | ICD-10-CM

## 2021-12-13 DIAGNOSIS — Z0189 Encounter for other specified special examinations: Secondary | ICD-10-CM

## 2021-12-13 NOTE — Telephone Encounter (Signed)
Order for BMET placed for this pt at the request of CT Scheduler. BMET order placed under Tereso Newcomer PA-C, for he ordered for the pt to get a repeat CT Angio Chest Aorta to be done in one year for surveillance. Order placed and will make CT scheduler aware of this, so she can arrange for the pt to come in and have this done prior to her CT Angio appt.

## 2021-12-13 NOTE — Telephone Encounter (Signed)
-----   Message from Fabiola Backer, [place] sent at 12/13/2021 10:36 AM EST ----- Regarding: BMET NEEDED Can I please get a BMET order placed for this patient? I will call to schedule

## 2021-12-14 ENCOUNTER — Other Ambulatory Visit: Payer: Medicare PPO | Admitting: *Deleted

## 2021-12-14 ENCOUNTER — Other Ambulatory Visit: Payer: Self-pay

## 2021-12-14 DIAGNOSIS — Z0189 Encounter for other specified special examinations: Secondary | ICD-10-CM | POA: Diagnosis not present

## 2021-12-14 DIAGNOSIS — I77810 Thoracic aortic ectasia: Secondary | ICD-10-CM | POA: Diagnosis not present

## 2021-12-14 DIAGNOSIS — I480 Paroxysmal atrial fibrillation: Secondary | ICD-10-CM | POA: Diagnosis not present

## 2021-12-14 DIAGNOSIS — I712 Thoracic aortic aneurysm, without rupture, unspecified: Secondary | ICD-10-CM

## 2021-12-14 DIAGNOSIS — I421 Obstructive hypertrophic cardiomyopathy: Secondary | ICD-10-CM | POA: Diagnosis not present

## 2021-12-14 LAB — BASIC METABOLIC PANEL
BUN/Creatinine Ratio: 14 (ref 12–28)
BUN: 12 mg/dL (ref 8–27)
CO2: 26 mmol/L (ref 20–29)
Calcium: 9 mg/dL (ref 8.7–10.3)
Chloride: 103 mmol/L (ref 96–106)
Creatinine, Ser: 0.88 mg/dL (ref 0.57–1.00)
Glucose: 82 mg/dL (ref 70–99)
Potassium: 4.7 mmol/L (ref 3.5–5.2)
Sodium: 139 mmol/L (ref 134–144)
eGFR: 66 mL/min/{1.73_m2} (ref >59)

## 2021-12-17 ENCOUNTER — Ambulatory Visit (INDEPENDENT_AMBULATORY_CARE_PROVIDER_SITE_OTHER)
Admission: RE | Admit: 2021-12-17 | Discharge: 2021-12-17 | Disposition: A | Discharge status: Home / Self Care | Payer: Medicare PPO | Source: Ambulatory Visit | Attending: Physician Assistant | Admitting: Physician Assistant

## 2021-12-17 ENCOUNTER — Other Ambulatory Visit: Payer: Self-pay

## 2021-12-17 DIAGNOSIS — I712 Thoracic aortic aneurysm, without rupture, unspecified: Secondary | ICD-10-CM | POA: Diagnosis not present

## 2021-12-17 DIAGNOSIS — I7121 Aneurysm of the ascending aorta, without rupture: Secondary | ICD-10-CM

## 2021-12-17 MED ORDER — IOHEXOL 350 MG/ML SOLN
80.0000 mL | Freq: Once | INTRAVENOUS | Status: AC | PRN
  Administered 2021-12-17: 80 mL via INTRAVENOUS

## 2021-12-21 ENCOUNTER — Other Ambulatory Visit: Payer: Self-pay | Admitting: *Deleted

## 2021-12-21 DIAGNOSIS — I712 Thoracic aortic aneurysm, without rupture, unspecified: Secondary | ICD-10-CM

## 2021-12-25 NOTE — Progress Notes (Unsigned)
Cardiology Office Note Date:  12/25/2021  Patient ID:  Alexandra Wheeler, DOB [birth year], MRN [id number] PCP:  Lucky CowboyMcKeown, William, MD  Cardiologist:  Dr. Shirlee LatchMcLean >> Dr. Shari ProwsPemberton Electrophysiologist: Dr. Johney FrameAllred    Chief Complaint: overdue annual EP visit  History of Present Illness: Alexandra Bottomsudrey McNeill Wheeler is a 81 y.o. female with history of Afib, CHB w/PPM, HTN, HLD, HOCM.  She comes in today to be seen for Dr. Ali LoweAllred,last seen by him Feb 2020.  At that time doing well, AF burden <1%, no changes were made.  Discussed possibility of stopping a/c if burden remained low at her next visit   I saw her 12/17/2019 She is doing well.  Denies any CP, palpitations or cardiac awareness, no near syncope or syncope, no SOB She is not exercising but reports she stays moving and busy, recently moved, was very active packing, unpacking, cleaning without exertional intolerances AF burden was ,1% No changes were made  Most recently she saw Dr. Shari ProwsPemberton 01/25/21, she was doing quite well, discussed recent issue with syncope felt 2/2 HCTZ and dehydration, mentioned Repeat TTE with LVEF 60-65%, no LVOT obstruction, mild SAM with moderate MR. She was exercising, no changes were made, planned to monitor serial echos.  I saw her Nov 2022 She is doing well, turns 81 tomorrow. All in all she is doing OK She wishes she had more energy, feels tired most of the time Sometimes feels like she is a little lightheaded (like now with good BP and HR/rhythm), is a sense of just feeling "off" Once some months ago felt more clearly lightheaded and layed down in bed, felt like something just came over her and she was weak, did not faint and eventually just felt better She has not with these symptoms or otherwise had CP, palpitations or cardiac awareness. She has felt this way probably since she turned 81 a year now, suspects she is just aging. She rarely uses the HCTZ, can not recall the last time she used it She had  concerns of her fatigue and worries given brother died of cancer, urged to follow up with her PMD Labs were done Very low AF burden, discussed restarting A/C vs monitoring, planned to monitor burden RA lead threshold was up some and programmed to maintain 2:1 margin   *** AF burden *** sees Pemberton for cards, due in March. Looks like scott has he scheduled for chest CT follow Ao ? Not scheduled for visit ***    Afib hx Diagnosed Nov 2019 AAD None to date  Device information SJM dual chamber PPM, implanted 06/11/2013  Past Medical History:  Diagnosis Date   Anxiety    Atrophic vaginitis    Chest pain    a. Adenosine sestamibi (1/14) with no evidence of ischemia or infarction, EF 63%.    Colon polyps    Complete heart block (HCC) 06-2013   status post pacemaker implantation by Dr Johney FrameAllred 06-11-2013   Depression    DI (detrusor instability)    GERD (gastroesophageal reflux disease)    History + Hyplori via EGD   H/O echocardiogram    a. Echo 11/2012:EF 50-55%, focal basal septal hypertrophy with some mitral valve SAM (mild) and MR.  This may be a sigmoid septum with advanced age or could be a variant of hypertrophic cardiomyopathy.   HOCM (hypertrophic obstructive cardiomyopathy) (HCC)    Echocardiogram 2/22: EF 60-65, no RWMA, severe asymmetric septal LVH, no LVOT, Gr 1 DD, mild SAM, mod MR, mild LAE, mod  to severe TR, Asc Ao 42 mm, normal RVSF, RVSP 28   HTN (hypertension)    Hyperlipidemia    LBBB (left bundle branch block)    Leukopenia 05/19/2014   Lymphocytosis 05/19/2014   Migraines    Myoview    Myoview 12/21: EF 69, no ischemia or infarction; low risk    Osteoporosis    Paroxysmal atrial fibrillation (HCC)    Prediabetes    Thoracic aortic aneurysm    Chest CTA 2/22: Ascending thoracic aortic aneurysm 44 mm; Ao arch 34 mm>>repeat 1 year   Vitamin D deficiency     Past Surgical History:  Procedure Laterality Date   BREAST BIOPSY Left 07/25/2019   ANGIOLIPOMA    BREAST EXCISIONAL BIOPSY Right    BREAST SURGERY     Breast cyst   CATARACT EXTRACTION Left 2019   Dr. Dione Booze   HEMORRHOID SURGERY     LEFT HEART CATHETERIZATION WITH CORONARY ANGIOGRAM N/A 06/11/2013   Procedure: LEFT HEART CATHETERIZATION WITH CORONARY ANGIOGRAM;  Surgeon: Kathleene Hazel, MD;  Location: [facility] CATH LAB;  Service: Cardiovascular;  Laterality: N/A;   PACEMAKER INSERTION  06-11-2013   STJ dual chamber pacemaker implanted by Dr Johney Frame for complete heart block   PERMANENT PACEMAKER INSERTION Left 06/11/2013   Procedure: PERMANENT PACEMAKER INSERTION;  Surgeon: Kathleene Hazel, MD;  Location[facility] CATH LAB;  Service: Cardiovascular;  Laterality: Left;   PERMANENT PACEMAKER INSERTION N/A 06/11/2013   Procedure: PERMANENT PACEMAKER INSERTION;  Surgeon: Hillis Range, MD;  Location: [facility] CATH LAB;  Service: Cardiovascular;  Laterality: N/A;   TEMPORARY PACEMAKER INSERTION N/A 06/11/2013   Procedure: TEMPORARY PACEMAKER INSERTION;  Surgeon: Kathleene Hazel, MD;  Location: [facility] CATH LAB;  Service: Cardiovascular;  Laterality: N/A;   TUBAL LIGATION      Current Outpatient Medications  Medication Sig Dispense Refill   acetaminophen (TYLENOL) 500 MG tablet Take 1,000 mg by mouth in the morning and at bedtime.     Alcohol Swabs (B-D SINGLE USE SWABS REGULAR) PADS Use as directed for Glucose monitoring Daily 100 each 3   ALPRAZolam (XANAX) 1 MG tablet TAKE 1/2 TO 1 TABLET AT BEDTIME ONLY IF NEEDED FOR SLEEP. TRY LIMIT TO 5 DAYS PER WEEK TO AVOID ADDICTION 20 tablet 0   Ascorbic Acid (VITAMIN C PO) Take 1 tablet by mouth daily.      busPIRone (BUSPAR) 10 MG tablet Take  1/2 to 1 tablet  2 to 3 x /day  for Anxiety 270 tablet 0   Calcium Carbonate-Vitamin D (CALCIUM + D PO) Take 1 tablet by mouth daily.      Cholecalciferol (VITAMIN D PO) Take 5,000 Units by mouth 2 (two) times a day.      citalopram (CELEXA) 20 MG tablet TAKE 1 TABLET EVERY DAY FOR MOOD 90 tablet 3   FOLIC ACID PO Take  1 tablet by mouth daily.      hydrochlorothiazide (HYDRODIURIL) 25 MG tablet Take 25 mg by mouth daily as needed (swelling).      Hydrocortisone Acetate 1 % OINT Apply to Hemorrhoid 3 to 4  x /day 60 g 3   IBU 600 MG tablet TAKE 1 TABLET THREE TIMES DAILY WITH FOOD FOR INFLAMMATION AND PAIN 270 tablet 3   magic mouthwash (nystatin, hydrocortisone, diphenhydrAMINE) suspension 1 tsp swish and spit every 2 hours as needed. (Patient not taking: Reported on 11/30/2021) 240 mL 0   MAGNESIUM PO Take 500 mg by mouth daily.      metoprolol (  TOPROL-XL) 200 MG 24 hr tablet TAKE 1 TABLET EVERY DAY FOR BLOOD PRESSURE AND HEART 90 tablet 3   Multiple Vitamin (MULTIVITAMIN) tablet Take 1 tablet by mouth daily.     pantoprazole (PROTONIX) 40 MG tablet Take 1 tablet Daily with Supper for Indigestion & Acid Reflux 90 tablet 3   Tretinoin, Facial Wrinkles, 0.05 % CREA APPLY TO AFFECTED AREA EVERY DAY AS DIRECTED 45 g 1   No current facility-administered medications for this visit.    Allergies:   Levsin [hyoscyamine sulfate], Mobic [meloxicam], Topamax [topiramate], Zoloft [sertraline hcl], Actonel [risedronate sodium], Boniva [ibandronic acid], Evista [raloxifene], and Fosamax [alendronate sodium]   Social History:  The patient  reports that she quit smoking about 46 years ago. Her smoking use included cigarettes. She has never used smokeless tobacco. She reports that she does not drink alcohol and does not use drugs.   Family History:  The patient's family history includes Aneurysm in her brother; Breast cancer (age of onset: 70) in her sister; Cancer in her father; Diabetes in her brother, brother, and mother; Heart disease in her brother, brother, and mother; Hypertension in her brother; Stroke in her brother.  ROS:  Please see the history of present illness.   All other systems are reviewed and otherwise negative.   PHYSICAL EXAM: VS:  There were no vitals taken for this visit. BMI: There is no height or  weight on file to calculate BMI. Well nourished, well developed, in no acute distress, looks younger then her age HEENT: normocephalic, atraumatic  Neck: no JVD, carotid bruits or masses Cardiac:  *** RRR; no significant murmurs, no rubs, or gallops Lungs:  *** CTA b/l, no wheezing, rhonchi or rales  Abd: soft, nontender MS: no deformity o atrophy Ext: *** no edema  Skin: warm and dry, no rash Neuro:  No gross deficits appreciated Psych: euthymic mood, full affect  *** PPM site is stable, no tenderness, fluctuation or tethering   EKG:  done today and reviewed by myself: ***   PPM interrogation done today and reviewed by myself:  ***   12/09/2020: TTE  1. Left ventricular ejection fraction, by estimation, is 60 to 65%. The  left ventricle has normal function. The left ventricle demonstrates  regional wall motion abnormalities with septal-lateral dyssynchrony. There  is severe asymmetric basal to mid  septal left ventricular hypertrophy. No LV outflow gradient. Left  ventricular diastolic parameters are consistent with Grade I diastolic  dysfunction (impaired relaxation).   2. The mitral valve shows mild systolic anterior motion. Moderate mitral  valve regurgitation. No evidence of mitral stenosis.   3. Left atrial size was mildly dilated.   4. Tricuspid valve regurgitation is moderate to severe.   5. The aortic valve is tricuspid. Aortic valve regurgitation is trivial.  No aortic stenosis is present.   6. Aortic dilatation noted. There is mild dilatation of the ascending  aorta, measuring 42 mm.   7. Right ventricular systolic function is normal. The right ventricular  size is mildly enlarged. There is normal pulmonary artery systolic  pressure. The estimated right ventricular systolic pressure is 28.0 mmHg.   8. The inferior vena cava is normal in size with greater than 50%  respiratory variability, suggesting right atrial pressure of 3 mmHg.   9. This echo is consistent  with hypertrophic cardiomyopathy.      11/05/2020: Stress myoview Nuclear stress EF: 69%. Blood pressure demonstrated a normal response to exercise. The study is normal. This is a low  risk study. The left ventricular ejection fraction is hyperdynamic (>65%).   Negative nuclear medicine stress test for ischemia or infarction.  03/10/2017: TTE Study Conclusions  - Left ventricle: The cavity size was normal. There was severe    focal basal hypertrophy of the septum and moderate hypertrophy of    the remaining myocardium. Systolic function was normal. The    estimated ejection fraction was in the range of 60% to 65%. Wall    motion was normal; there were no regional wall motion    abnormalities. Doppler parameters are consistent with abnormal    left ventricular relaxation (grade 1 diastolic dysfunction).    Doppler parameters are consistent with elevated ventricular    end-diastolic filling pressure.  - Aortic valve: There was mild regurgitation.  - Mitral valve: There was trivial regurgitation.  - Right ventricle: The cavity size was normal. Wall thickness was    normal. Systolic function was normal.  - Right atrium: The atrium was normal in size.  - Tricuspid valve: There was moderate regurgitation.  - Pulmonary arteries: Systolic pressure was mildly increased. PA    peak pressure: 38 mm Hg (S).  - Inferior vena cava: The vessel was normal in size.  - Pericardium, extracardiac: There was no pericardial effusion.   Impressions:   - Findings consistent with HCM. No difference since the study on    07/31/2015.     11.30.2016: ETT Fair exercise capacity.  No chest pain.  Normal BP response to exercise (139/91 >> 170/99 >> 169/94). Paced rhythm.  ECG could not be interpreted. There were frequent PVCs. No exercise induced ventricular arrhythmias.     Recent Labs: 01/08/2021: Magnesium 2.2; TSH 1.08 09/23/2021: ALT 27; Hemoglobin 12.9; Platelets 186 12/14/2021: BUN 12;  Creatinine, Ser 0.88; Potassium 4.7; Sodium 139  01/08/2021: Cholesterol 183; HDL 47; LDL Cholesterol (Calc) 116; Total CHOL/HDL Ratio 3.9; Triglycerides 97   CrCl cannot be calculated (Unknown ideal weight.).   Wt Readings from Last 3 Encounters:  11/30/21 165 lb (74.8 kg)  11/04/21 165 lb (74.8 kg)  09/23/21 164 lb 3.2 oz (74.5 kg)     Other studies reviewed: Additional studies/records reviewed today include: summarized above  ASSESSMENT AND PLAN:  1. PPM     Intact function     ***  2. Paroxysmal Afib     CHA2DS2Vasc is 4, no longer on a/c      *** % AMS       ***  3. HTN     Looks good  4. Generalized fatigue ***      Disposition: ***   Current medicines are reviewed at length with the patient today.  The patient did not have any concerns regarding medicines.  Norma Fredrickson, PA-C 12/25/2021 10:14 AM     [facility] [address] [place] [address] [phone] (office)  [phone] (fax)

## 2021-12-27 ENCOUNTER — Ambulatory Visit (INDEPENDENT_AMBULATORY_CARE_PROVIDER_SITE_OTHER): Payer: Medicare PPO | Admitting: Nurse Practitioner

## 2021-12-27 ENCOUNTER — Other Ambulatory Visit: Payer: Self-pay

## 2021-12-27 ENCOUNTER — Encounter: Payer: Self-pay | Admitting: Nurse Practitioner

## 2021-12-27 VITALS — BP 128/78 | HR 63 | Temp 97.3°F | Wt 161.8 lb

## 2021-12-27 DIAGNOSIS — R6889 Other general symptoms and signs: Secondary | ICD-10-CM | POA: Diagnosis not present

## 2021-12-27 DIAGNOSIS — J069 Acute upper respiratory infection, unspecified: Secondary | ICD-10-CM

## 2021-12-27 DIAGNOSIS — Z1152 Encounter for screening for COVID-19: Secondary | ICD-10-CM

## 2021-12-27 DIAGNOSIS — I10 Essential (primary) hypertension: Secondary | ICD-10-CM

## 2021-12-27 LAB — POCT INFLUENZA A/B
Influenza A, POC: NEGATIVE
Influenza B, POC: NEGATIVE

## 2021-12-27 LAB — POC COVID19 BINAXNOW: SARS Coronavirus 2 Ag: NEGATIVE

## 2021-12-27 MED ORDER — DEXAMETHASONE 1 MG PO TABS: ORAL_TABLET | ORAL | 0 refills | Status: DC

## 2021-12-27 NOTE — Progress Notes (Signed)
Assessment and Plan:  Alexandra Wheeler was seen today for acute visit.  Diagnoses and all orders for this visit:  Flu-like symptoms -     POCT Influenza A/B  Encounter for screening for COVID-19 -     POC COVID-19  URI, acute -     dexamethasone (DECADRON) 1 MG tablet; Take 3 tabs for 3 days, 2 tabs for 3 days 1 tab for 5 days. Take with food. Mucinex twice a day Dexamethasone 3 tabs x 3 days, 2 tabs x 3 days and 1 tab x 5 days Tylenol as needed for pain/fever If no improvement in the next 5 days please notify the office   Essential Hypertension - continue medications, DASH diet, exercise and monitor at home. Call if greater than 130/80.   - Advised to bring cuff with her to the next visit so can compare to our reading     Further disposition pending results of labs. Discussed med's effects and SE's.   Over 30 minutes of exam, counseling, chart review, and critical decision making was performed.   Future Appointments  Date Time Provider [place]  01/05/2022  8:10 AM [facility] DEVICE REMOTES [facility]  01/17/2022  2:00 PM Unk Pinto, MD [facility] None  01/25/2022  9:15 AM [place], PA-C [facility]  04/06/2022  8:10 AM [facility] DEVICE REMOTES [facility]    ------------------------------------------------------------------------------------------------------------------   HPI BP 128/78    Pulse 63    Temp (!) 97.3 F (36.3 C)    Wt 161 lb 12.8 oz (73.4 kg)    SpO2 99%    BMI 26.92 kg/m   81 y.o.female presents for sinus congestion, body aches, fatigue, ear pain, sore throat, low grade fever and loss of appetite which has been present for 3 days.  She denies cough, vomiting, diarrhea.  Blood pressure is currently well controlled on Metoprolol 200 mg QD and HCTZ 25 mg as needed for leg swelling. She states her home BP cuff has been reading elevated BP Readings from Last 3 Encounters:  12/27/21 128/78  11/30/21  106/80  11/04/21 116/70     Past Medical History:  Diagnosis Date   Anxiety    Atrophic vaginitis    Chest pain    a. Adenosine sestamibi (1/14) with no evidence of ischemia or infarction, EF 63%.    Colon polyps    Complete heart block ([place]) 06-2013   status post pacemaker implantation by Dr Rayann Heman 06-11-2013   Depression    DI (detrusor instability)    GERD (gastroesophageal reflux disease)    History + Hyplori via EGD   H/O echocardiogram    a. Echo 11/2012:EF 50-55%, focal basal septal hypertrophy with some mitral valve SAM (mild) and MR.  This may be a sigmoid septum with advanced age or could be a variant of hypertrophic cardiomyopathy.   HOCM (hypertrophic obstructive cardiomyopathy) ([place])    Echocardiogram 2/22: EF 60-65, no RWMA, severe asymmetric septal LVH, no LVOT, Gr 1 DD, mild SAM, mod MR, mild LAE, mod to severe TR, Asc Ao 42 mm, normal RVSF, RVSP 28   HTN (hypertension)    Hyperlipidemia    LBBB (left bundle branch block)    Leukopenia 05/19/2014   Lymphocytosis 05/19/2014   Migraines    Myoview    Myoview 12/21: EF 69, no ischemia or infarction; low risk    Osteoporosis    Paroxysmal atrial fibrillation ([place])    Prediabetes    Thoracic aortic aneurysm    Chest CTA  2/22: Ascending thoracic aortic aneurysm 44 mm; Ao arch 34 mm>>repeat 1 year   Vitamin D deficiency      Allergies  Allergen Reactions   Levsin [Hyoscyamine Sulfate]     unknown   Mobic [Meloxicam]     unknown    Topamax [Topiramate]     unknown    Zoloft [Sertraline Hcl]     unknown    Actonel [Risedronate Sodium] Other (See Comments)    Pt didn't like the way it made her feel    Boniva [Ibandronic Acid] Other (See Comments)    Pt didn't like the way it made her feel   Evista [Raloxifene] Other (See Comments)    Pt didn't like the way it made her feel   Fosamax [Alendronate Sodium] Other (See Comments)    Pt didn't like the way it made her feel     Current Outpatient Medications on  File Prior to Visit  Medication Sig   acetaminophen (TYLENOL) 500 MG tablet Take 1,000 mg by mouth in the morning and at bedtime.   ALPRAZolam (XANAX) 1 MG tablet TAKE 1/2 TO 1 TABLET AT BEDTIME ONLY IF NEEDED FOR SLEEP. TRY LIMIT TO 5 DAYS PER WEEK TO AVOID ADDICTION   Ascorbic Acid (VITAMIN C PO) Take 1 tablet by mouth daily.    Calcium Carbonate-Vitamin D (CALCIUM + D PO) Take 1 tablet by mouth daily.    Cholecalciferol (VITAMIN D PO) Take 5,000 Units by mouth 2 (two) times a day.    citalopram (CELEXA) 20 MG tablet TAKE 1 TABLET EVERY DAY FOR MOOD   FOLIC ACID PO Take 1 tablet by mouth daily.    hydrochlorothiazide (HYDRODIURIL) 25 MG tablet Take 25 mg by mouth daily as needed (swelling).    Hydrocortisone Acetate 1 % OINT Apply to Hemorrhoid 3 to 4  x /day   MAGNESIUM PO Take 500 mg by mouth daily.    metoprolol (TOPROL-XL) 200 MG 24 hr tablet TAKE 1 TABLET EVERY DAY FOR BLOOD PRESSURE AND HEART   Multiple Vitamin (MULTIVITAMIN) tablet Take 1 tablet by mouth daily.   pantoprazole (PROTONIX) 40 MG tablet Take 1 tablet Daily with Supper for Indigestion & Acid Reflux   Tretinoin, Facial Wrinkles, 0.05 % CREA APPLY TO AFFECTED AREA EVERY DAY AS DIRECTED   Alcohol Swabs (B-D SINGLE USE SWABS REGULAR) PADS Use as directed for Glucose monitoring Daily   busPIRone (BUSPAR) 10 MG tablet Take  1/2 to 1 tablet  2 to 3 x /day  for Anxiety (Patient not taking: Reported on 12/27/2021)   IBU 600 MG tablet TAKE 1 TABLET THREE TIMES DAILY WITH FOOD FOR INFLAMMATION AND PAIN (Patient not taking: Reported on 12/27/2021)   magic mouthwash (nystatin, hydrocortisone, diphenhydrAMINE) suspension 1 tsp swish and spit every 2 hours as needed. (Patient not taking: Reported on 11/30/2021)   No current facility-administered medications on file prior to visit.    ROS: all negative except above.   Physical Exam:  BP 128/78    Pulse 63    Temp (!) 97.3 F (36.3 C)    Wt 161 lb 12.8 oz (73.4 kg)    SpO2 99%    BMI  26.92 kg/m   General Appearance: Well nourished, in no apparent distress. Eyes: PERRLA, EOMs, conjunctiva no swelling or erythema Sinuses: No Frontal/maxillary tenderness ENT/Mouth: Ext aud canals clear, TMs without erythema, bulging. Mild erythema post pharynx with no exudate. Tonsils not swollen or erythematous. Hearing normal.  Neck: Supple, thyroid normal.  Respiratory:  Respiratory effort normal, BS equal bilaterally without rales, rhonchi, wheezing or stridor.  Cardio: RRR with no MRGs. Brisk peripheral pulses without edema.  Abdomen: Soft, + BS.  Non tender, no guarding, rebound, hernias, masses. Lymphatics: Non tender without lymphadenopathy.  Musculoskeletal: Full ROM, 5/5 strength, normal gait.  Skin: Warm, dry without rashes, lesions, ecchymosis.  Neuro: Cranial nerves intact. Normal muscle tone, no cerebellar symptoms. Sensation intact.  Psych: Awake and oriented X 3, normal affect, Insight and Judgment appropriate.     Magda Bernheim, NP 3:18 PM [facility]

## 2021-12-27 NOTE — Patient Instructions (Addendum)
Chloraseptic or Cepacol for sore throat Mucinex twice a day Dexamethasone 3 tabs x 3 days, 2 tabs x 3 days and 1 tab x 5 days Tylenol as needed for pain/fever  Upper Respiratory Infection, Adult An upper respiratory infection (URI) affects the nose, throat, and upper airways that lead to the lungs. The most common type of URI is often called the common cold. URIs usually get better on their own, without medical treatment. What are the causes? A URI is caused by a germ (virus). You may catch these germs by: Breathing in droplets from an infected person's cough or sneeze. Touching something that has the germ on it (is contaminated) and then touching your mouth, nose, or eyes. What increases the risk? You are more likely to get a URI if: You are very young or very old. You have close contact with others, such as at work, school, or a health care facility. You smoke. You have long-term (chronic) heart or lung disease. You have a weakened disease-fighting system (immune system). You have nasal allergies or asthma. You have a lot of stress. You have poor nutrition. What are the signs or symptoms? Runny or stuffy (congested) nose. Cough. Sneezing. Sore throat. Headache. Feeling tired (fatigue). Fever. Not wanting to eat as much as usual. Pain in your forehead, behind your eyes, and over your cheekbones (sinus pain). Muscle aches. Redness or irritation of the eyes. Pressure in the ears or face. How is this treated? URIs usually get better on their own within 7-10 days. Medicines cannot cure URIs, but your doctor may recommend certain medicines to help relieve symptoms, such as: Over-the-counter cold medicines. Medicines to reduce coughing (cough suppressants). Coughing is a type of defense against infection that helps to clear the nose, throat, windpipe, and lungs (respiratory system). Take these medicines only as told by your doctor. Medicines to lower your fever. Follow these  instructions at home: Activity Rest as needed. If you have a fever, stay home from work or school until your fever is gone, or until your doctor says you may return to work or school. You should stay home until you cannot spread the infection anymore (you are not contagious). Your doctor may have you wear a face mask so you have less risk of spreading the infection. Relieving symptoms Rinse your mouth often with salt water. To make salt water, dissolve -1 tsp (3-6 g) of salt in 1 cup (237 mL) of warm water. Use a cool-mist humidifier to add moisture to the air. This can help you breathe more easily. Eating and drinking  Drink enough fluid to keep your pee (urine) pale yellow. Eat soups and other clear broths. General instructions  Take over-the-counter and prescription medicines only as told by your doctor. Do not smoke or use any products that contain nicotine or tobacco. If you need help quitting, ask your doctor. Avoid being where people are smoking (avoid secondhand smoke). Stay up to date on all your shots (immunizations), and get the flu shot every year. Keep all follow-up visits. How to prevent the spread of infection to others  Wash your hands with soap and water for at least 20 seconds. If you cannot use soap and water, use hand sanitizer. Avoid touching your mouth, face, eyes, or nose. Cough or sneeze into a tissue or your sleeve or elbow. Do not cough or sneeze into your hand or into the air. Contact a doctor if: You are getting worse, not better. You have any of these: A fever  or chills. Brown or red mucus in your nose. Yellow or brown fluid (discharge)coming from your nose. Pain in your face, especially when you bend forward. Swollen neck glands. Pain when you swallow. White areas in the back of your throat. Get help right away if: You have shortness of breath that gets worse. You have very bad or constant: Headache. Ear pain. Pain in your forehead, behind your  eyes, and over your cheekbones (sinus pain). Chest pain. You have long-lasting (chronic) lung disease along with any of these: Making high-pitched whistling sounds when you breathe, most often when you breathe out (wheezing). Long-lasting cough (more than 14 days). Coughing up blood. A change in your usual mucus. You have a stiff neck. You have changes in your: Vision. Hearing. Thinking. Mood. These symptoms may be an emergency. Get help right away. Call 911. Do not wait to see if the symptoms will go away. Do not drive yourself to the hospital. Summary An upper respiratory infection (URI) is caused by a germ (virus). The most common type of URI is often called the common cold. URIs usually get better within 7-10 days. Take over-the-counter and prescription medicines only as told by your doctor. This information is not intended to replace advice given to you by your health care provider. Make sure you discuss any questions you have with your health care provider. Document Revised: 05/26/2021 Document Reviewed: 05/26/2021 Elsevier Patient Education  [place].

## 2021-12-28 ENCOUNTER — Encounter: Payer: Medicare PPO | Admitting: Physician Assistant

## 2021-12-29 ENCOUNTER — Other Ambulatory Visit: Payer: Self-pay | Admitting: Nurse Practitioner

## 2021-12-29 ENCOUNTER — Ambulatory Visit: Payer: Medicare PPO | Admitting: Nurse Practitioner

## 2021-12-29 ENCOUNTER — Telehealth: Payer: Self-pay

## 2021-12-29 DIAGNOSIS — J069 Acute upper respiratory infection, unspecified: Secondary | ICD-10-CM

## 2021-12-29 MED ORDER — PREDNISONE 5 MG PO TABS: 5.0000 mg | ORAL_TABLET | Freq: Every day | ORAL | 0 refills | Status: AC

## 2021-12-29 MED ORDER — PREDNISONE 5 MG PO TABS: 5.0000 mg | ORAL_TABLET | Freq: Every day | ORAL | 0 refills | Status: DC

## 2021-12-29 NOTE — Telephone Encounter (Signed)
Patient called and said dexamethasone was too strong for her. Dana sent in prednisone 5mg  one a day for 7 days. Patient aware of medication change and to let know if she has any problems.

## 2022-01-05 LAB — CUP PACEART REMOTE DEVICE CHECK
Battery Remaining Longevity: 7 mo
Battery Remaining Percentage: 9 %
Battery Voltage: 2.8 V
Brady Statistic AP VP Percent: 98 %
Brady Statistic AP VS Percent: 1 %
Brady Statistic AS VP Percent: 2.1 %
Brady Statistic AS VS Percent: 1 %
Brady Statistic RA Percent Paced: 96 %
Brady Statistic RV Percent Paced: 99 %
Date Time Interrogation Session: 20230301054229
Implantable Lead Implant Date: 20140805
Implantable Lead Implant Date: 20140805
Implantable Lead Location: 753859
Implantable Lead Location: 753860
Implantable Lead Model: 1944
Implantable Lead Model: 1948
Implantable Pulse Generator Implant Date: 20140805
Lead Channel Impedance Value: 580 Ohm
Lead Channel Impedance Value: 610 Ohm
Lead Channel Pacing Threshold Amplitude: 0.875 V
Lead Channel Pacing Threshold Amplitude: 1.5 V
Lead Channel Pacing Threshold Pulse Width: 0.4 ms
Lead Channel Pacing Threshold Pulse Width: 1 ms
Lead Channel Sensing Intrinsic Amplitude: 12 mV
Lead Channel Sensing Intrinsic Amplitude: 2.6 mV
Lead Channel Setting Pacing Amplitude: 1.125
Lead Channel Setting Pacing Amplitude: 3 V
Lead Channel Setting Pacing Pulse Width: 0.4 ms
Lead Channel Setting Sensing Sensitivity: 4 mV
Pulse Gen Model: 2240
Pulse Gen Serial Number: 7523768

## 2022-01-16 ENCOUNTER — Encounter: Payer: Self-pay | Admitting: Internal Medicine

## 2022-01-16 NOTE — Progress Notes (Signed)
Annual Screening/Preventative Visit & Comprehensive Evaluation &  Examination  Future Appointments  Date Time Provider Department  01/17/2022  2:00 PM Lucky Cowboy, MD [facility]  01/25/2022  9:15 AM Sheilah Pigeon, PA-C [facility]  01/23/2023  2:00 PM Lucky Cowboy, MD [facility]        This very nice 81 y.o. WWF  presents for a Screening /Preventative Visit & comprehensive evaluation and management of multiple medical co-morbidities.  Patient has been followed for HTN, HLD, T2_NIDDM  Prediabetes  and Vitamin D Deficiency. Chest CT 12/2021 showed Aortic Atherosclerosis & an ascending thoracic aortic aneurysm followed by Cardiology. Patient has GERD controlled with Diet & Pantoprazole.        Today , patient  who is RH also reports recent development of an action tremor of her hands ( Lt > Rt). She also notes that her mother had a tremor .         HTN predates circa2005.  In 2014 , patient presented asymptomatic with CHB and ultimately had PPM implanted. Myoview was negative in Jan 2014 as was heart cath in Aug 2014. ETT in 2016 was also Negative.  I n 2019 , she was discovered in pAfib and was started on Eliquis - later  d/c'd in Oct 2020.     Dr Johney Frame follows patient for HCM /ASH and  Ao Arch Thoracic Aneurysm .  Patient's BP has been controlled at home and patient denies any cardiac symptoms as chest pain, palpitations, shortness of breath, dizziness or ankle swelling. Today's          Patient's hyperlipidemia is not controlled with diet . Last lipids were not at goal :  Lab Results  Component Value Date   CHOL 183 01/08/2021   HDL 47 (L) 01/08/2021   LDLCALC 116 (H) 01/08/2021   TRIG 97 01/08/2021   CHOLHDL 3.9 01/08/2021         Patient has hx/o prediabetes (A1c 5.9% /2011 & 2016) and patient denies reactive hypoglycemic symptoms, visual blurring, diabetic polys or paresthesias. Last A1c was near goal :  Lab Results  Component Value Date   HGBA1C 5.7 (H)  01/08/2021         Finally, patient has history of Vitamin D Deficiency ("34" /2008) and last Vitamin D was at goal :  Lab Results  Component Value Date   VD25OH 93 01/08/2021     Current Outpatient Medications on File Prior to Visit  Medication Sig   acetaminophen (TYLENOL) 500 MG tablet Take 1,000 mg  in the morning and at bedtime.   ALPRAZolam (XANAX) 1 MG tablet TAKE 1/2 TO 1 TAB AT BEDTIME IF NEEDED    Ascorbic Acid (VITAMIN C PO) Take 1 tablet daily.    Calcium Carbonate-Vitamin D  Take 1 tablet daily.    VITAMIN D 5,000 Units  Take 2 (two) times a day.    citalopram 20 MG tablet TAKE 1 TABLET EVERY DAY    FOLIC ACID P Take 1 tablet daily.    Hctz  25 MG tablet Take daily as needed (swelling).    Hydrocortisone 1 % OINT Apply to Hemorrhoid 3 to 4  x /day   MAGNESIUM 500 mg Take  daily.    metoprolol -XL 200 MG 2 TAKE 1 TABLET EVERY DAY    Multiple Vitamin  Take 1 tablet  daily.   Pantoprazole 40 MG tablet Take 1 tablet Daily    Tretinoin, Facial Wrinkles, 0.05 % CREA APPLY EVERY DAY AS DIRECTED  Allergies  Allergen Reactions   Levsin [Hyoscyamine Sulfate]     unknown   Mobic [Meloxicam]     unknown   Topamax [Topiramate]     unknown   Zoloft [Sertraline Hcl]     unknown   Actonel [Risedronate Sodium] Other (See Comments)    Pt didn't like the way it made her feel    Boniva [Ibandronic Acid] Other (See Comments)    Pt didn't like the way it made her feel   Evista [Raloxifene] Other (See Comments)    Pt didn't like the way it made her feel   Fosamax [Alendronate Sodium] Other (See Comments)    Pt didn't like the way it made her feel      Past Medical History:  Diagnosis Date   Anxiety    Atrophic vaginitis    Chest pain    a. Adenosine sestamibi (1/14) with no evidence of ischemia or infarction, EF 63%.    Colon polyps    Complete heart block (HCC) 06-2013   status post pacemaker implantation by Dr Johney FrameAllred 06-11-2013   Depression    DI (detrusor  instability)    GERD (gastroesophageal reflux disease)    History + Hyplori via EGD   H/O echocardiogram    a. Echo 11/2012:EF 50-55%, focal basal septal hypertrophy with some mitral valve SAM (mild) and MR.  This may be a sigmoid septum with advanced age or could be a variant of hypertrophic cardiomyopathy.   HOCM (hypertrophic obstructive cardiomyopathy) (HCC)    Echocardiogram 2/22: EF 60-65, no RWMA, severe asymmetric septal LVH, no LVOT, Gr 1 DD, mild SAM, mod MR, mild LAE, mod to severe TR, Asc Ao 42 mm, normal RVSF, RVSP 28   HTN (hypertension)    Hyperlipidemia    LBBB (left bundle branch block)    Leukopenia 05/19/2014   Lymphocytosis 05/19/2014   Migraines    Myoview    Myoview 12/21: EF 69, no ischemia or infarction; low risk    Osteoporosis    Paroxysmal atrial fibrillation (HCC)    Prediabetes    Thoracic aortic aneurysm    Chest CTA 2/22: Ascending thoracic aortic aneurysm 44 mm; Ao arch 34 mm>>repeat 1 year   Vitamin D deficiency      Health Maintenance  Topic Date Due   COVID-19 Vaccine (1) Never done   Zoster Vaccines- Shingrix (1 of 2) Never done   Pneumonia Vaccine 7465+ Years old (2 - PPSV23 if available, else PCV20) 08/31/2016   TETANUS/TDAP  05/08/2021   INFLUENZA VACCINE  06/07/2021   DEXA SCAN  Completed   HPV VACCINES  Aged Out     Immunization History  Administered Date(s) Administered   Fluad Quad(high Dose) 07/25/2019   Influenza 08/06/2014, 09/20/2017   Influenza, High Dose  09/01/2015, 08/19/2016   Influenza 09/12/2018   Pneumococcal -13 09/01/2015   Pneumococcal -23 10/08/2010   Td 05/09/2011   Zoster, Live 05/09/2011    Last Colon - 11/11/2013 - Dr Buccimi - Recc 5 yr f/u    Last MGM - 08/30/2021   Last Dexa BMD - 07/05/2017   T= -2.7 Spine , Osteoporosis - overdue   Past Surgical History:  Procedure Laterality Date   BREAST BIOPSY Left 07/25/2019   ANGIOLIPOMA   BREAST EXCISIONAL BIOPSY Right    BREAST SURGERY     Breast cyst    CATARACT EXTRACTION Left 2019   Dr. Dione BoozeGroat   HEMORRHOID SURGERY     LEFT HEART CATHETERIZATION WITH CORONARY ANGIOGRAM  N/A 06/11/2013   Procedure: LEFT HEART CATHETERIZATION WITH CORONARY ANGIOGRAM;  Surgeon: Kathleene Hazel, MD;  Location[facility] CATH LAB;  Service: Cardiovascular;  Laterality: N/A;   PACEMAKER INSERTION  06-11-2013   STJ dual chamber pacemaker implanted by Dr Johney Frame for complete heart block   PERMANENT PACEMAKER INSERTION Left 06/11/2013   Procedure: PERMANENT PACEMAKER INSERTION;  Surgeon: Kathleene Hazel, MD;  Location: [facility] CATH LAB;  Service: Cardiovascular;  Laterality: Left;   PERMANENT PACEMAKER INSERTION N/A 06/11/2013   Procedure: PERMANENT PACEMAKER INSERTION;  Surgeon: Hillis Range, MD;  Location: [facility] CATH LAB;  Service: Cardiovascular;  Laterality: N/A;   TEMPORARY PACEMAKER INSERTION N/A 06/11/2013   Procedure: TEMPORARY PACEMAKER INSERTION;  Surgeon: Kathleene Hazel, MD;  Location: [facility] CATH LAB;  Service: Cardiovascular;  Laterality: N/A;   TUBAL LIGATION       Family History  Problem Relation Age of Onset   Diabetes Mother    Heart disease Mother    Diabetes Brother    Hypertension Brother    Heart disease Brother    Breast cancer Sister 16   Aneurysm Brother    Stroke Brother    Cancer Father        prostate   Diabetes Brother    Heart disease Brother      Social History   Tobacco Use   Smoking status: Former    Types: Cigarettes    Quit date: 10/09/1975    Years since quitting: 46.3   Smokeless tobacco: Never  Vaping Use   Vaping Use: Never used  Substance Use Topics   Alcohol use: No   Drug use: No      ROS Constitutional: Denies fever, chills, weight loss/gain, headaches, insomnia,  night sweats, and change in appetite. Does c/o fatigue. Eyes: Denies redness, blurred vision, diplopia, discharge, itchy, watery eyes.  ENT: Denies discharge, congestion, post nasal drip, epistaxis, sore throat, earache, hearing loss, dental pain,  Tinnitus, Vertigo, Sinus pain, snoring.  Cardio: Denies chest pain, palpitations, irregular heartbeat, syncope, dyspnea, diaphoresis, orthopnea, PND, claudication, edema Respiratory: denies cough, dyspnea, DOE, pleurisy, hoarseness, laryngitis, wheezing.  Gastrointestinal: Denies dysphagia, heartburn, reflux, water brash, pain, cramps, nausea, vomiting, bloating, diarrhea, constipation, hematemesis, melena, hematochezia, jaundice, hemorrhoids Genitourinary: Denies dysuria, frequency, urgency, nocturia, hesitancy, discharge, hematuria, flank pain Breast: Breast lumps, nipple discharge, bleeding.  Musculoskeletal: Denies arthralgia, myalgia, stiffness, Jt. Swelling, pain, limp, and strain/sprain. Denies falls. Skin: Denies puritis, rash, hives, warts, acne, eczema, changing in skin lesion Neuro: No weakness, tremor, incoordination, spasms, paresthesia, pain Psychiatric: Denies confusion, memory loss, sensory loss. Denies Depression. Endocrine: Denies change in weight, skin, hair change, nocturia, and paresthesia, diabetic polys, visual blurring, hyper / hypo glycemic episodes.  Heme/Lymph: No excessive bleeding, bruising, enlarged lymph nodes.  Physical Exam  There were no vitals taken for this visit.  General Appearance: Well nourished, well groomed and in no apparent distress.  Eyes: PERRLA, EOMs, conjunctiva no swelling or erythema, normal fundi and vessels. Sinuses: No frontal/maxillary tenderness ENT/Mouth: EACs patent / TMs  nl. Nares clear without erythema, swelling, mucoid exudates. Oral hygiene is good. No erythema, swelling, or exudate. Tongue normal, non-obstructing. Tonsils not swollen or erythematous. Hearing normal.  Neck: Supple, thyroid not palpable. No bruits, nodes or JVD. Respiratory: Respiratory effort normal.  BS equal and clear bilateral without rales, rhonci, wheezing or stridor. Cardio: Heart sounds are normal with regular rate and rhythm and no murmurs, rubs or gallops.  Peripheral pulses are normal and equal bilaterally without edema. No aortic  or femoral bruits. Chest: symmetric with normal excursions and percussion. Breasts: Symmetric, without lumps, nipple discharge, retractions, or fibrocystic changes.  Abdomen: Flat, soft with bowel sounds active. Nontender, no guarding, rebound, hernias, masses, or organomegaly.  Lymphatics: Non tender without lymphadenopathy.  Genitourinary:  Musculoskeletal: Full ROM all peripheral extremities, joint stability, 5/5 strength, and normal gait. Skin: Warm and dry without rashes, lesions, cyanosis, clubbing or  ecchymosis.  Neuro: Cranial nerves intact, reflexes equal bilaterally. Normal muscle tone, no cerebellar symptoms. Sensation intact.  ASlight high frequency low amplitude action tremor of Lt > Rt  hand.  Pysch: Alert and oriented X 3, normal affect, Insight and Judgment appropriate.    Assessment and Plan  1. Annual Preventative Screening Examination   2. Essential hypertension  - EKG 12-Lead - Urinalysis, Routine w reflex microscopic - Microalbumin / creatinine urine ratio - CBC with Differential/Platelet - Magnesium - TSH - COMPLETE METABOLIC PANEL WITH GFR  3. Hyperlipidemia, mixed  - EKG 12-Lead - Lipid panel - TSH - COMPLETE METABOLIC PANEL WITH GFR  4. Abnormal glucose  - EKG 12-Lead - Hemoglobin A1c - Insulin, random - COMPLETE METABOLIC PANEL WITH GFR  5. Vitamin D deficiency  - VITAMIN D 25 Hydroxy   6. Thoracic aortic aneurysm   - EKG 12-Lead - Lipid panel  7. Paroxysmal atrial fibrillation (HCC)  - EKG 12-Lead - TSH  8. Pacemaker  - EKG 12-Lead  9. LBBB (left bundle branch block)  - EKG 12-Lead  10. Localized osteoporosis without current pathological fracture  - DG Bone Density; Future  11. Screening for colorectal cancer  - POC Hemoccult Bld/Stl  12. Screening for ischemic heart disease  - EKG 12-Lead  13. FHx: heart disease  - EKG 12-Lead  14.  Former smoker  - EKG 12-Lead  15. Medication management  - Urinalysis, Routine w reflex microscopic - Microalbumin / creatinine urine ratio - CBC with Differential/Platelet - Magnesium - Lipid panel - TSH - Hemoglobin A1c - Insulin, random - VITAMIN D 25 Hydroxy  - COMPLETE METABOLIC PANEL WITH GFR             Patient was counseled in prudent diet to achieve/maintain BMI less than 25 for weight control, BP monitoring, regular exercise and medications. Discussed med's effects and SE's. Screening labs and tests as requested with regular follow-up as recommended. Over 40 minutes of exam, counseling, chart review and high complex critical decision making was performed.   Marinus Maw, MD

## 2022-01-16 NOTE — Patient Instructions (Signed)
Due to recent changes in healthcare laws, you may see the results of your imaging and laboratory studies on MyChart before your provider has had a chance to review them.  We understand that in some cases there may be results that are confusing or concerning to you. Not all laboratory results come back in the same time frame and the provider may be waiting for multiple results in order to interpret others.  Please give us 48 hours in order for your provider to thoroughly review all the results before contacting the office for clarification of your results.  ° °+++++++++++++++++++++++++ ° Vit D  & °Vit C 1,000 mg   °are recommended to help protect  °against the Covid-19 and other Corona viruses.  ° ° Also it's recommended  °to take  °Zinc 50 mg  °to help  °protect against the Covid-19   °and best place to get ° is also on [employer]  °and don't pay more than 6-8 cents /pill !  °================================ °Coronavirus (COVID-19) Are you at risk? ° °Are you at risk for the Coronavirus (COVID-19)? ° °To be considered HIGH RISK for Coronavirus (COVID-19), you have to meet the following criteria: ° °Traveled to [place], [place], [place], [place] or [place]; or in the [place] to [place], [place], [place]  °or [place]; and have fever, cough, and shortness of breath within the last 2 weeks of travel OR °Been in close contact with a person diagnosed with COVID-19 within the last 2 weeks and have  °fever, cough,and shortness of breath ° °IF YOU DO NOT MEET THESE CRITERIA, YOU ARE CONSIDERED LOW RISK FOR COVID-19. ° °What to do if you are HIGH RISK for COVID-19? ° °If you are having a medical emergency, call 911. °Seek medical care right away. Before you go to a doctor’s office, urgent care or emergency department, ° call ahead and tell them about your recent travel, contact with someone diagnosed with COVID-19  ° and your symptoms.  °You should receive instructions from your physician’s office regarding next  steps of care.  °When you arrive at healthcare provider, tell the healthcare staff immediately you have returned from  °visiting [place], [place], [place], [place] or [place]; or traveled in the [place] to [place], [place],  °[place] or [place] in the last two weeks or you have been in close contact with a person diagnosed with  °COVID-19 in the last 2 weeks.   °Tell the health care staff about your symptoms: fever, cough and shortness of breath. °After you have been seen by a medical provider, you will be either: °Tested for (COVID-19) and discharged home on quarantine except to seek medical care if  °symptoms worsen, and asked to  °Stay home and avoid contact with others until you get your results (4-5 days)  °Avoid travel on public transportation if possible (such as bus, train, or airplane) or °Sent to the Emergency Department by EMS for evaluation, COVID-19 testing  and  °possible admission depending on your condition and test results. ° °What to do if you are LOW RISK for COVID-19? ° °Reduce your risk of any infection by using the same precautions used for avoiding the common cold or flu:  °Wash your hands often with soap and warm water for at least 20 seconds.  If soap and water are not readily available,  °use an alcohol-based hand sanitizer with at least 60% alcohol.  °If coughing or sneezing, cover your mouth and nose by coughing   or sneezing into the elbow areas of your shirt or coat,  into a tissue or into your sleeve (not your hands). Avoid shaking hands with others and consider head nods or verbal greetings only. Avoid touching your eyes, nose, or mouth with unwashed hands.  Avoid close contact with people who are sick. Avoid places or events with large numbers of people in one location, like concerts or sporting events. Carefully consider travel plans you have or are making. If you are planning any travel outside or inside the [place], visit the [facility] webpage for the  latest health notices. If you have some symptoms but not all symptoms, continue to monitor at home and seek medical attention  if your symptoms worsen. If you are having a medical emergency, call 911.   >>>>>>>>>>>>>>>>>>>>>>>>>>>>>>>>>  We Do NOT Approve of [employer] > > > > > > > > > > > > > > > > > > > > > > > > > > > > > > > > > > > > > > >  Preventive Care for Adults  A healthy lifestyle and preventive care can promote health and wellness. Preventive health guidelines for women include the following key practices. A routine yearly physical is a good way to check with your health care provider about your health and preventive screening. It is a chance to share any concerns and updates on your health and to receive a thorough exam. Visit your dentist for a routine exam and preventive care every 6 months. Brush your teeth twice a day and floss once a day. Good oral hygiene prevents tooth decay and gum disease. The frequency of eye exams is based on your age, health, family medical history, use of contact lenses, and other factors. Follow your health care provider's recommendations for frequency of eye exams. Eat a healthy diet. Foods like vegetables, fruits, whole grains, low-fat dairy products, and lean protein foods contain the nutrients you need without too many calories. Decrease your intake of foods high in solid fats, added sugars, and salt. Eat the right amount of calories for you. Get information about a proper diet from your health care provider, if necessary. Regular physical exercise is one of the most important things you can do for your health. Most adults should get at least 150 minutes of moderate-intensity exercise (any activity that increases your heart rate and causes you to sweat) each week. In addition, most adults need muscle-strengthening exercises on 2 or more days a week. Maintain a healthy weight. The body mass index (BMI) is a screening tool to identify  possible weight problems. It provides an estimate of body fat based on height and weight. Your health care provider can find your BMI and can help you achieve or maintain a healthy weight. For adults 20 years and older: A BMI below 18.5 is considered underweight. A BMI of 18.5 to 24.9 is normal. A BMI of 25 to 29.9 is considered overweight. A BMI of 30 and above is considered obese. Maintain normal blood lipids and cholesterol levels by exercising and minimizing your intake of saturated fat. Eat a balanced diet with plenty of fruit and vegetables. If your lipid or cholesterol levels are high, you are over 50, or you are at high risk for heart disease, you may need your cholesterol levels checked more frequently. Ongoing high lipid and cholesterol levels should be treated with medicines if diet and exercise are not working. If you smoke, find out from  your health care provider how to quit. If you do not use tobacco, do not start. °Lung cancer screening is recommended for adults aged 55-80 years who are at high risk for developing lung cancer because of a history of smoking. A yearly low-dose CT scan of the lungs is recommended for people who have at least a 30-pack-year history of smoking and are a current smoker or have quit within the past 15 years. A pack year of smoking is smoking an average of 1 pack of cigarettes a day for 1 year (for example: 1 pack a day for 30 years or 2 packs a day for 15 years). Yearly screening should continue until the smoker has stopped smoking for at least 15 years. Yearly screening should be stopped for people who develop a health problem that would prevent them from having lung cancer treatment. °Avoid use of street drugs. Do not share needles with anyone. Ask for help if you need support or instructions about stopping the use of drugs. °High blood pressure causes heart disease and increases the risk of stroke.  Ongoing high blood pressure should be treated with medicines if  weight loss and exercise do not work. °If you are 55-79 years old, ask your health care provider if you should take aspirin to prevent strokes. °Diabetes screening involves taking a blood sample to check your fasting blood sugar level. This should be done once every 3 years, after age 45, if you are within normal weight and without risk factors for diabetes. Testing should be considered at a younger age or be carried out more frequently if you are overweight and have at least 1 risk factor for diabetes. °Breast cancer screening is essential preventive care for women. You should practice "breast self-awareness." This means understanding the normal appearance and feel of your breasts and may include breast self-examination. Any changes detected, no matter how small, should be reported to a health care provider. Women in their 20s and 30s should have a clinical breast exam (CBE) by a health care provider as part of a regular health exam every 1 to 3 years. After age 40, women should have a CBE every year. Starting at age 40, women should consider having a mammogram (breast X-ray test) every year. Women who have a family history of breast cancer should talk to their health care provider about genetic screening. Women at a high risk of breast cancer should talk to their health care providers about having an MRI and a mammogram every year. °Breast cancer gene (BRCA)-related cancer risk assessment is recommended for women who have family members with BRCA-related cancers. BRCA-related cancers include breast, ovarian, tubal, and peritoneal cancers. Having family members with these cancers may be associated with an increased risk for harmful changes (mutations) in the breast cancer genes BRCA1 and BRCA2. Results of the assessment will determine the need for genetic counseling and BRCA1 and BRCA2 testing. °Routine pelvic exams to screen for cancer are no longer recommended for nonpregnant women who are considered low risk for  cancer of the pelvic organs (ovaries, uterus, and vagina) and who do not have symptoms. Ask your health care provider if a screening pelvic exam is right for you. °If you have had past treatment for cervical cancer or a condition that could lead to cancer, you need Pap tests and screening for cancer for at least 20 years after your treatment. If Pap tests have been discontinued, your risk factors (such as having a new sexual partner) need to be   reassessed to determine if screening should be resumed. Some women have medical problems that increase the chance of getting cervical cancer. In these cases, your health care provider may recommend more frequent screening and Pap tests.  Colorectal cancer can be detected and often prevented. Most routine colorectal cancer screening begins at the age of 64 years and continues through age 10 years. However, your health care provider may recommend screening at an earlier age if you have risk factors for colon cancer. On a yearly basis, your health care provider may provide home test kits to check for hidden blood in the stool. Use of a small camera at the end of a tube, to directly examine the colon (sigmoidoscopy or colonoscopy), can detect the earliest forms of colorectal cancer. Talk to your health care provider about this at age 78, when routine screening begins.  Direct exam of the colon should be repeated every 5-10 years through age 28 years, unless early forms of pre-cancerous polyps or small growths are found. Osteoporosis is a disease in which the bones lose minerals and strength with aging. This can result in serious bone fractures or breaks. The risk of osteoporosis can be identified using a bone density scan. Women ages 57 years and over and women at risk for fractures or osteoporosis should discuss screening with their health care providers. Ask your health care provider whether you should take a calcium supplement or vitamin D to reduce the rate of  osteoporosis. Menopause can be associated with physical symptoms and risks. Hormone replacement therapy is available to decrease symptoms and risks. You should talk to your health care provider about whether hormone replacement therapy is right for you. Use sunscreen. Apply sunscreen liberally and repeatedly throughout the day. You should seek shade when your shadow is shorter than you. Protect yourself by wearing long sleeves, pants, a wide-brimmed hat, and sunglasses year round, whenever you are outdoors. Once a month, do a whole body skin exam, using a mirror to look at the skin on your back. Tell your health care provider of new moles, moles that have irregular borders, moles that are larger than a pencil eraser, or moles that have changed in shape or color. Stay current with required vaccines (immunizations). Influenza vaccine. All adults should be immunized every year. Tetanus, diphtheria, and acellular pertussis (Td, Tdap) vaccine. Pregnant women should receive 1 dose of Tdap vaccine during each pregnancy. The dose should be obtained regardless of the length of time since the last dose. Immunization is preferred during the 27th-36th week of gestation. An adult who has not previously received Tdap or who does not know her vaccine status should receive 1 dose of Tdap. This initial dose should be followed by tetanus and diphtheria toxoids (Td) booster doses every 10 years. Adults with an unknown or incomplete history of completing a 3-dose immunization series with Td-containing vaccines should begin or complete a primary immunization series including a Tdap dose. Adults should receive a Td booster every 10 years.  Zoster vaccine. One dose is recommended for adults aged 29 years or older unless certain conditions are present.  Pneumococcal 13-valent conjugate (PCV13) vaccine. When indicated, a person who is uncertain of her immunization history and has no record of immunization should receive the PCV13  vaccine. An adult aged 43 years or older who has certain medical conditions and has not been previously immunized should receive 1 dose of PCV13 vaccine. This PCV13 should be followed with a dose of pneumococcal polysaccharide (PPSV23) vaccine. The PPSV23  vaccine dose should be obtained at least 1 or more year(s) after the dose of PCV13 vaccine. An adult aged 29 years or older who has certain medical conditions and previously received 1 or more doses of PPSV23 vaccine should receive 1 dose of PCV13. The PCV13 vaccine dose should be obtained 1 or more years after the last PPSV23 vaccine dose.  Pneumococcal polysaccharide (PPSV23) vaccine. When PCV13 is also indicated, PCV13 should be obtained first. All adults aged 70 years and older should be immunized. An adult younger than age 16 years who has certain medical conditions should be immunized. Any person who resides in a nursing home or long-term care facility should be immunized. An adult smoker should be immunized. People with an immunocompromised condition and certain other conditions should receive both PCV13 and PPSV23 vaccines. People with human immunodeficiency virus (HIV) infection should be immunized as soon as possible after diagnosis. Immunization during chemotherapy or radiation therapy should be avoided. Routine use of PPSV23 vaccine is not recommended for American Indians, [place] Natives, or people younger than 65 years unless there are medical conditions that require PPSV23 vaccine. When indicated, people who have unknown immunization and have no record of immunization should receive PPSV23 vaccine. One-time revaccination 5 years after the first dose of PPSV23 is recommended for people aged 19-64 years who have chronic kidney failure, nephrotic syndrome, asplenia, or immunocompromised conditions. People who received 1-2 doses of PPSV23 before age 52 years should receive another dose of PPSV23 vaccine at age 58 years or later if at least 5 years have  passed since the previous dose. Doses of PPSV23 are not needed for people immunized with PPSV23 at or after age 10 years.  Preventive Services / Frequency  Ages 70 years and over Blood pressure check. Lipid and cholesterol check. Lung cancer screening. / Every year if you are aged 21-80 years and have a 30-pack-year history of smoking and currently smoke or have quit within the past 15 years. Yearly screening is stopped once you have quit smoking for at least 15 years or develop a health problem that would prevent you from having lung cancer treatment. Clinical breast exam.** / Every year after age 45 years.  BRCA-related cancer risk assessment.** / For women who have family members with a BRCA-related cancer (breast, ovarian, tubal, or peritoneal cancers). Mammogram.** / Every year beginning at age 24 years and continuing for as long as you are in good health. Consult with your health care provider. Pap test.** / Every 3 years starting at age 75 years through age 70 or 86 years with 3 consecutive normal Pap tests. Testing can be stopped between 65 and 70 years with 3 consecutive normal Pap tests and no abnormal Pap or HPV tests in the past 10 years. Fecal occult blood test (FOBT) of stool. / Every year beginning at age 21 years and continuing until age 62 years. You may not need to do this test if you get a colonoscopy every 10 years. Flexible sigmoidoscopy or colonoscopy.** / Every 5 years for a flexible sigmoidoscopy or every 10 years for a colonoscopy beginning at age 39 years and continuing until age 81 years. Hepatitis C blood test.** / For all people born from [birth year] through 1966 and any individual with known risks for hepatitis C. Osteoporosis screening.** / A one-time screening for women ages 61 years and over and women at risk for fractures or osteoporosis. Skin self-exam. / Monthly. Influenza vaccine. / Every year. Tetanus, diphtheria, and acellular pertussis (Tdap/Td) vaccine.** /  1 dose  of Td every 10 years. Zoster vaccine.** / 1 dose for adults aged 19 years or older. Pneumococcal 13-valent conjugate (PCV13) vaccine.** / Consult your health care provider. Pneumococcal polysaccharide (PPSV23) vaccine.** / 1 dose for all adults aged 4 years and older. Screening for abdominal aortic aneurysm (AAA)  by ultrasound is recommended for people who have history of high blood pressure or who are current or former smokers. ++++++++++++++++++++ Recommend Adult Low Dose Aspirin or  coated  Aspirin 81 mg daily  To reduce risk of Colon Cancer 40 %,  Skin Cancer 26 % ,  Melanoma 46%  and  Pancreatic cancer 60% ++++++++++++++++++++ Vitamin D goal  is between 70-100.  Please make sure that you are taking your Vitamin D as directed.  It is very important as a natural anti-inflammatory  helping hair, skin, and nails, as well as reducing stroke and heart attack risk.  It helps your bones and helps with mood. It also decreases numerous cancer risks so please take it as directed.  Low Vit D is associated with a 200-300% higher risk for CANCER  and 200-300% higher risk for HEART   ATTACK  &  STROKE.   .....................................Marland Kitchen It is also associated with higher death rate at younger ages,  autoimmune diseases like Rheumatoid arthritis, Lupus, Multiple Sclerosis.    Also many other serious conditions, like depression, Alzheimer's Dementia, infertility, muscle aches, fatigue, fibromyalgia - just to name a few. ++++++++++++++++++ Recommend the book "The END of DIETING" by Dr Excell Seltzer  & the book "The END of DIABETES " by Dr Excell Seltzer At [email or web address] - get book & Audio CD's    Being diabetic has a  300% increased risk for heart attack, stroke, cancer, and alzheimer- type vascular dementia. It is very important that you work harder with diet by avoiding all foods that are white. Avoid white rice (brown & wild rice is OK), white potatoes (sweetpotatoes in moderation is OK),  White bread or wheat bread or anything made out of white flour like bagels, donuts, rolls, buns, biscuits, cakes, pastries, cookies, pizza crust, and pasta (made from white flour & egg whites) - vegetarian pasta or spinach or wheat pasta is OK. Multigrain breads like Arnold's or [employer], or multigrain sandwich thins or flatbreads.  Diet, exercise and weight loss can reverse and cure diabetes in the early stages.  Diet, exercise and weight loss is very important in the control and prevention of complications of diabetes which affects every system in your body, ie. Brain - dementia/stroke, eyes - glaucoma/blindness, heart - heart attack/heart failure, kidneys - dialysis, stomach - gastric paralysis, intestines - malabsorption, nerves - severe painful neuritis, circulation - gangrene & loss of a leg(s), and finally cancer and Alzheimers.    I recommend avoid fried & greasy foods,  sweets/candy, white rice (brown or wild rice or Quinoa is OK), white potatoes (sweet potatoes are OK) - anything made from white flour - bagels, doughnuts, rolls, buns, biscuits,white and wheat breads, pizza crust and traditional pasta made of white flour & egg white(vegetarian pasta or spinach or wheat pasta is OK).  Multi-grain bread is OK - like multi-grain flat bread or sandwich thins. Avoid alcohol in excess. Exercise is also important.    Eat all the vegetables you want - avoid meat, especially red meat and dairy - especially cheese.  Cheese is the most concentrated form of trans-fats which is the worst thing to clog up our arteries. Veggie cheese is OK  which can be found in the fresh produce section at [employer] or [employer] or [employer] ° °+++++++++++++++++++ °DASH Eating Plan ° °DASH stands for "Dietary Approaches to Stop Hypertension."  ° °The DASH eating plan is a healthy eating plan that has been shown to reduce high blood pressure (hypertension). Additional health benefits may include reducing the risk of type 2  diabetes mellitus, heart disease, and stroke. The DASH eating plan may also help with weight loss. °WHAT DO I NEED TO KNOW ABOUT THE DASH EATING PLAN? °For the DASH eating plan, you will follow these general guidelines: °Choose foods with a percent daily value for sodium of less than 5% (as listed on the food label). °Use salt-free seasonings or herbs instead of table salt or sea salt. °Check with your health care provider or pharmacist before using salt substitutes. °Eat lower-sodium products, often labeled as "lower sodium" or "no salt added." °Eat fresh foods. °Eat more vegetables, fruits, and low-fat dairy products. °Choose whole grains. Look for the word "whole" as the first word in the ingredient list. °Choose fish  °Limit sweets, desserts, sugars, and sugary drinks. °Choose heart-healthy fats. °Eat veggie cheese  °Eat more home-cooked food and less restaurant, buffet, and fast food. °Limit fried foods. °Cook foods using methods other than frying. °Limit canned vegetables. If you do use them, rinse them well to decrease the sodium. °When eating at a restaurant, ask that your food be prepared with less salt, or no salt if possible. °                  °   WHAT FOODS CAN I EAT? °Read Dr Joel Fuhrman's books on The End of Dieting & The End of Diabetes ° °Grains °Whole grain or whole wheat bread. Brown rice. Whole grain or whole wheat pasta. Quinoa, bulgur, and whole grain cereals. Low-sodium cereals. Corn or whole wheat flour tortillas. Whole grain cornbread. Whole grain crackers. Low-sodium crackers. ° °Vegetables °Fresh or frozen vegetables (raw, steamed, roasted, or grilled). Low-sodium or reduced-sodium tomato and vegetable juices. Low-sodium or reduced-sodium tomato sauce and paste. Low-sodium or reduced-sodium canned vegetables.  ° °Fruits °All fresh, canned (in natural juice), or frozen fruits. ° °Protein Products ° All fish and seafood.  Dried beans, peas, or lentils. Unsalted nuts and seeds. Unsalted  canned beans. ° °Dairy °Low-fat dairy products, such as skim or 1% milk, 2% or reduced-fat cheeses, low-fat ricotta or cottage cheese, or plain low-fat yogurt. Low-sodium or reduced-sodium cheeses. ° °Fats and Oils °Tub margarines without trans fats. Light or reduced-fat mayonnaise and salad dressings (reduced sodium). Avocado. Safflower, olive, or canola oils. Natural peanut or almond butter. ° °Other °Unsalted popcorn and pretzels. °The items listed above may not be a complete list of recommended foods or beverages. Contact your dietitian for more options. ° °+++++++++++++++ ° °WHAT FOODS ARE NOT RECOMMENDED? °Grains/ White flour or wheat flour °White bread. White pasta. White rice. Refined cornbread. Bagels and croissants. Crackers that contain trans fat. ° °Vegetables ° °Creamed or fried vegetables. Vegetables in a . Regular canned vegetables. Regular canned tomato sauce and paste. Regular tomato and vegetable juices. ° °Fruits °Dried fruits. Canned fruit in light or heavy syrup. Fruit juice. ° °Meat and Other Protein Products °Meat in general - RED meat & White meat.  Fatty cuts of meat. Ribs, chicken wings, all processed meats as bacon, sausage, bologna, salami, fatback, hot dogs, bratwurst and packaged luncheon meats. ° °Dairy °Whole or 2% milk, cream, half-and-half, and cream cheese.   Whole-fat or sweetened yogurt. Full-fat cheeses or blue cheese. Non-dairy creamers and whipped toppings. Processed cheese, cheese spreads, or cheese curds. ° °Condiments °Onion and garlic salt, seasoned salt, table salt, and sea salt. Canned and packaged gravies. Worcestershire sauce. Tartar sauce. Barbecue sauce. Teriyaki sauce. Soy sauce, including reduced sodium. Steak sauce. Fish sauce. Oyster sauce. Cocktail sauce. Horseradish. Ketchup and mustard. Meat flavorings and tenderizers. Bouillon cubes. Hot sauce. Tabasco sauce. Marinades. Taco seasonings. Relishes. ° °Fats and Oils °Butter, stick margarine, lard, shortening and  bacon fat. Coconut, palm kernel, or palm oils. Regular salad dressings. ° °Pickles and olives. Salted popcorn and pretzels. ° °The items listed above may not be a complete list of foods and beverages to avoid. ° ° °

## 2022-01-17 ENCOUNTER — Other Ambulatory Visit: Payer: Self-pay

## 2022-01-17 ENCOUNTER — Ambulatory Visit (INDEPENDENT_AMBULATORY_CARE_PROVIDER_SITE_OTHER): Payer: Medicare PPO | Admitting: Internal Medicine

## 2022-01-17 ENCOUNTER — Encounter: Payer: Self-pay | Admitting: Internal Medicine

## 2022-01-17 VITALS — BP 130/88 | HR 62 | Temp 97.9°F | Resp 16 | Ht 65.0 in | Wt 161.2 lb

## 2022-01-17 DIAGNOSIS — Z8249 Family history of ischemic heart disease and other diseases of the circulatory system: Secondary | ICD-10-CM

## 2022-01-17 DIAGNOSIS — I712 Thoracic aortic aneurysm, without rupture, unspecified: Secondary | ICD-10-CM | POA: Diagnosis not present

## 2022-01-17 DIAGNOSIS — Z0000 Encounter for general adult medical examination without abnormal findings: Secondary | ICD-10-CM

## 2022-01-17 DIAGNOSIS — G250 Essential tremor: Secondary | ICD-10-CM

## 2022-01-17 DIAGNOSIS — Z87891 Personal history of nicotine dependence: Secondary | ICD-10-CM | POA: Diagnosis not present

## 2022-01-17 DIAGNOSIS — M816 Localized osteoporosis [Lequesne]: Secondary | ICD-10-CM

## 2022-01-17 DIAGNOSIS — I10 Essential (primary) hypertension: Secondary | ICD-10-CM

## 2022-01-17 DIAGNOSIS — Z0001 Encounter for general adult medical examination with abnormal findings: Secondary | ICD-10-CM

## 2022-01-17 DIAGNOSIS — Z79899 Other long term (current) drug therapy: Secondary | ICD-10-CM

## 2022-01-17 DIAGNOSIS — E559 Vitamin D deficiency, unspecified: Secondary | ICD-10-CM | POA: Diagnosis not present

## 2022-01-17 DIAGNOSIS — E782 Mixed hyperlipidemia: Secondary | ICD-10-CM | POA: Diagnosis not present

## 2022-01-17 DIAGNOSIS — I442 Atrioventricular block, complete: Secondary | ICD-10-CM

## 2022-01-17 DIAGNOSIS — Z950 Presence of cardiac pacemaker: Secondary | ICD-10-CM | POA: Diagnosis not present

## 2022-01-17 DIAGNOSIS — R7309 Other abnormal glucose: Secondary | ICD-10-CM | POA: Diagnosis not present

## 2022-01-17 DIAGNOSIS — I447 Left bundle-branch block, unspecified: Secondary | ICD-10-CM

## 2022-01-17 DIAGNOSIS — Z136 Encounter for screening for cardiovascular disorders: Secondary | ICD-10-CM | POA: Diagnosis not present

## 2022-01-17 DIAGNOSIS — I480 Paroxysmal atrial fibrillation: Secondary | ICD-10-CM | POA: Diagnosis not present

## 2022-01-17 DIAGNOSIS — Z1211 Encounter for screening for malignant neoplasm of colon: Secondary | ICD-10-CM

## 2022-01-17 MED ORDER — NADOLOL 40 MG PO TABS
ORAL_TABLET | ORAL | 3 refills | Status: DC
Start: 2022-01-17 — End: 2022-01-17

## 2022-01-17 MED ORDER — NADOLOL 40 MG PO TABS: ORAL_TABLET | ORAL | 3 refills | Status: DC

## 2022-01-18 LAB — MICROALBUMIN / CREATININE URINE RATIO
Creatinine, Urine: 45 mg/dL (ref 20–275)
Microalb Creat Ratio: 9 mcg/mg creat (ref <30)
Microalb, Ur: 0.4 mg/dL

## 2022-01-18 LAB — LIPID PANEL
Cholesterol: 189 mg/dL (ref <200)
HDL: 47 mg/dL — ABNORMAL LOW (ref >=50)
LDL Cholesterol (Calc): 114 mg/dL (calc) — ABNORMAL HIGH
Non-HDL Cholesterol (Calc): 142 mg/dL (calc) — ABNORMAL HIGH (ref <130)
Total CHOL/HDL Ratio: 4 (calc) (ref <5.0)
Triglycerides: 161 mg/dL — ABNORMAL HIGH (ref <150)

## 2022-01-18 LAB — COMPLETE METABOLIC PANEL WITH GFR
AG Ratio: 1.4 (calc) (ref 1.0–2.5)
ALT: 19 U/L (ref 6–29)
AST: 18 U/L (ref 10–35)
Albumin: 3.8 g/dL (ref 3.6–5.1)
Alkaline phosphatase (APISO): 91 U/L (ref 37–153)
BUN: 13 mg/dL (ref 7–25)
CO2: 29 mmol/L (ref 20–32)
Calcium: 9.4 mg/dL (ref 8.6–10.4)
Chloride: 106 mmol/L (ref 98–110)
Creat: 0.68 mg/dL (ref 0.60–0.95)
Globulin: 2.8 g/dL (calc) (ref 1.9–3.7)
Glucose, Bld: 86 mg/dL (ref 65–99)
Potassium: 4.8 mmol/L (ref 3.5–5.3)
Sodium: 142 mmol/L (ref 135–146)
Total Bilirubin: 0.4 mg/dL (ref 0.2–1.2)
Total Protein: 6.6 g/dL (ref 6.1–8.1)
eGFR: 87 mL/min/{1.73_m2} (ref >=60)

## 2022-01-18 LAB — URINALYSIS, ROUTINE W REFLEX MICROSCOPIC
Bacteria, UA: NONE SEEN /HPF
Bilirubin Urine: NEGATIVE
Glucose, UA: NEGATIVE
Hgb urine dipstick: NEGATIVE
Hyaline Cast: NONE SEEN /LPF
Ketones, ur: NEGATIVE
Nitrite: NEGATIVE
Protein, ur: NEGATIVE
RBC / HPF: NONE SEEN /HPF (ref 0–2)
Specific Gravity, Urine: 1.008 (ref 1.001–1.035)
Squamous Epithelial / HPF: NONE SEEN /HPF (ref <=5)
pH: 6 (ref 5.0–8.0)

## 2022-01-18 LAB — MICROSCOPIC MESSAGE

## 2022-01-18 LAB — CBC WITH DIFFERENTIAL/PLATELET
Absolute Monocytes: 422 cells/uL (ref 200–950)
Basophils Absolute: 38 cells/uL (ref 0–200)
Basophils Relative: 0.8 %
Eosinophils Absolute: 91 cells/uL (ref 15–500)
Eosinophils Relative: 1.9 %
HCT: 41.3 % (ref 35.0–45.0)
Hemoglobin: 13.1 g/dL (ref 11.7–15.5)
Lymphs Abs: 2093 cells/uL (ref 850–3900)
MCH: 27.7 pg (ref 27.0–33.0)
MCHC: 31.7 g/dL — ABNORMAL LOW (ref 32.0–36.0)
MCV: 87.3 fL (ref 80.0–100.0)
MPV: 10.8 fL (ref 7.5–12.5)
Monocytes Relative: 8.8 %
Neutro Abs: 2155 cells/uL (ref 1500–7800)
Neutrophils Relative %: 44.9 %
Platelets: 201 10*3/uL (ref 140–400)
RBC: 4.73 10*6/uL (ref 3.80–5.10)
RDW: 12.8 % (ref 11.0–15.0)
Total Lymphocyte: 43.6 %
WBC: 4.8 10*3/uL (ref 3.8–10.8)

## 2022-01-18 LAB — MAGNESIUM: Magnesium: 2.4 mg/dL (ref 1.5–2.5)

## 2022-01-18 LAB — VITAMIN D 25 HYDROXY (VIT D DEFICIENCY, FRACTURES): Vit D, 25-Hydroxy: 65 ng/mL (ref 30–100)

## 2022-01-18 LAB — TSH: TSH: 0.65 mIU/L (ref 0.40–4.50)

## 2022-01-18 LAB — HEMOGLOBIN A1C
Hgb A1c MFr Bld: 5.9 % of total Hgb — ABNORMAL HIGH (ref <5.7)
Mean Plasma Glucose: 123 mg/dL
eAG (mmol/L): 6.8 mmol/L

## 2022-01-18 LAB — INSULIN, RANDOM: Insulin: 6 u[IU]/mL

## 2022-01-18 NOTE — Progress Notes (Signed)
<><><><><><><><><><><><><><><><><><><><><><><><><><><><><><><><><> ?<><><><><><><><><><><><><><><><><><><><><><><><><><><><><><><><><> ?-   Test results slightly outside the reference range are not unusual. ?If there is anything important, I will review this with you,  ?otherwise it is considered normal test values.  ?If you have further questions,  ?please do not hesitate to contact me at the office or via My Chart.  ?<><><><><><><><><><><><><><><><><><><><><><><><><><><><><><><><><> ?<><><><><><><><><><><><><><><><><><><><><><><><><><><><><><><><><> ? ?-  Total Chol = 189 - is [place], but  ? ?- Bad Chol is very high at 115 ?         (  Ideal or Goal  is less than 70  !  )   ? ?- So  please work harder with a low cholesterol diet  ? ?- Cholesterol is too high - Recommend low cholesterol diet  ? ?- Cholesterol only comes from animal sources  ?- ie. meat, dairy, egg yolks ? ?- Eat all the vegetables you want. ? ?- Avoid meat, especially red meat - Beef AND Pork . ? ?- Avoid cheese & dairy - milk & ice cream.    ? ?- Cheese is the most concentrated form of trans-fats which  ?is the worst thing to clog up our arteries.  ? ?- Veggie cheese is OK which can be found in the fresh  ?produce section at [employer] or [employer] or [employer] ?<><><><><><><><><><><><><><><><><><><><><><><><><><><><><><><><><> ?<><><><><><><><><><><><><><><><><><><><><><><><><><><><><><><><><> ? ?- A1c = 5.9%  Blood sugar and A1c is STILL  elevated in the borderline and  ?early or pre-diabetes range which has the same  ? ?300% increased risk for heart attack, stroke, cancer and  ? ?alzheimer- type vascular dementia as full blown diabetes.  ? ?But the good news is that diet, exercise with  ?weight loss can cure the early diabetes at this point. ?<><><><><><><><><><><><><><><><><><><><><><><><><><><><><><><><><> ?<><><><><><><><><><><><><><><><><><><><><><><><><><><><><><><><><> ? ?- Vitamin D = 65   - Excellent    - Please keep dose same     ?<><><><><><><><><><><><><><><><><><><><><><><><><><><><><><><><><> ?<><><><><><><><><><><><><><><><><><><><><><><><><><><><><><><><><> ? ?- All Else - CBC - Kidneys - Electrolytes - Liver - Magnesium & Thyroid   ? ?- all  Normal / OK ?<><><><><><><><><><><><><><><><><><><><><><><><><><><><><><><><><> ?<><><><><><><><><><><><><><><><><><><><><><><><><><><><><><><><><> ? ? ? ? ? ? ? ? ? ? ? ? ? ? ? ? ? ? ? ? ? ? ? ? ? ? ? ? ? ?

## 2022-01-23 NOTE — Progress Notes (Signed)
? ?Cardiology Office Note ?Date:  01/23/2022  ?Patient ID:  Alexandra Wheeler, DOB 30-Jul-1941, MRN [id number] ?PCP:  Lucky CowboyMcKeown, William, MD  ?Cardiologist:  Dr. Shirlee LatchMcLean >> Dr. Shari ProwsPemberton ?Electrophysiologist: Dr. Johney FrameAllred ? ?  ?Chief Complaint:  planned f/u ? ?History of Present Illness: ?Alexandra Wheeler is a 81 y.o. female with history of Afib, CHB w/PPM, HTN, HLD, HOCM. ? ?She comes in today to be seen for Dr. Ali LoweAllred,last seen by him Feb 2020.  At that time doing well, AF burden <1%, no changes were made.  Discussed possibility of stopping a/c if burden remained low at her next visit ? ? ?I saw her 12/17/2019 ?She is doing well.  Denies any CP, palpitations or cardiac awareness, no near syncope or syncope, no SOB ?She is not exercising but reports she stays moving and busy, recently moved, was very active packing, unpacking, cleaning without exertional intolerances ?AF burden was ,1% ?No changes were made ? ?Most recently she saw Dr. Shari ProwsPemberton 01/25/21, she was doing quite well, discussed recent issue with syncope felt 2/2 HCTZ and dehydration, mentioned Repeat TTE with LVEF 60-65%, no LVOT obstruction, mild SAM with moderate MR. ?She was exercising, no changes were made, planned to monitor serial echos. ? ?I saw her Nov 2022 ?She is doing well, turns 80 tomorrow. ?All in all she is doing OK ?She wishes she had more energy, feels tired most of the time ?Sometimes feels like she is a little lightheaded (like now with good BP and HR/rhythm), is a sense of just feeling "off" ?Once some months ago felt more clearly lightheaded and layed down in bed, felt like something just came over her and she was weak, did not faint and eventually just felt better ?She has not with these symptoms or otherwise had CP, palpitations or cardiac awareness. ?She has felt this way probably since she turned [age] a year now, suspects she is just aging. ?She rarely uses the HCTZ, can not recall the last time she used it ?She had concerns of  her fatigue and worries given brother died of cancer, urged to follow up with her PMD ?Labs were done ?Very low AF burden, discussed restarting A/C vs monitoring, planned to monitor burden ?RA lead threshold was up some and programmed to maintain 2:1 margin ? ? ?TODAY ?No changes. ?Still wishes she had more energy, though does her ADLs at a pace, able to get her things done ?No CP, palpitations or SOB ?Her PMD has changed her metoprolol to Nadolol for better management of her tremor ?She has not yet gotten the nadolol, worries abut her BP ?No dizzy spells, near syncope or syncope. ? ? ?Afib hx ?Diagnosed Nov 2019 ?AAD ?None to date ? ?Device information ?SJM dual chamber PPM, implanted 06/11/2013 ? ?Past Medical History:  ?Diagnosis Date  ? Anxiety   ? Atrophic vaginitis   ? Chest pain   ? a. Adenosine sestamibi (1/14) with no evidence of ischemia or infarction, EF 63%.   ? Colon polyps   ? Complete heart block (HCC) 06-2013  ? status post pacemaker implantation by Dr Johney FrameAllred 06-11-2013  ? Depression   ? DI (detrusor instability)   ? GERD (gastroesophageal reflux disease)   ? History + Hyplori via EGD  ? H/O echocardiogram   ? a. Echo 11/2012:EF 50-55%, focal basal septal hypertrophy with some mitral valve SAM (mild) and MR.  This may be a sigmoid septum with advanced age or could be a variant of hypertrophic cardiomyopathy.  ? HOCM (  hypertrophic obstructive cardiomyopathy) (HCC)   ? Echocardiogram 2/22: EF 60-65, no RWMA, severe asymmetric septal LVH, no LVOT, Gr 1 DD, mild SAM, mod MR, mild LAE, mod to severe TR, Asc Ao 42 mm, normal RVSF, RVSP 28  ? HTN (hypertension)   ? Hyperlipidemia   ? LBBB (left bundle branch block)   ? Leukopenia 05/19/2014  ? Lymphocytosis 05/19/2014  ? Migraines   ? Myoview   ? Myoview 12/21: EF 69, no ischemia or infarction; low risk   ? Osteoporosis   ? Paroxysmal atrial fibrillation (HCC)   ? Prediabetes   ? Thoracic aortic aneurysm   ? Chest CTA 2/22: Ascending thoracic aortic aneurysm 44 mm;  Ao arch 34 mm>>repeat 1 year  ? Vitamin D deficiency   ? ? ?Past Surgical History:  ?Procedure Laterality Date  ? BREAST BIOPSY Left 07/25/2019  ? ANGIOLIPOMA  ? BREAST EXCISIONAL BIOPSY Right   ? BREAST SURGERY    ? Breast cyst  ? CATARACT EXTRACTION Left 2019  ? Dr. Dione Booze  ? HEMORRHOID SURGERY    ? LEFT HEART CATHETERIZATION WITH CORONARY ANGIOGRAM N/A 06/11/2013  ? Procedure: LEFT HEART CATHETERIZATION WITH CORONARY ANGIOGRAM;  Surgeon: Kathleene Hazel, MD;  Location[facility] CATH LAB;  Service: Cardiovascular;  Laterality: N/A;  ? PACEMAKER INSERTION  06-11-2013  ? STJ dual chamber pacemaker implanted by Dr Johney Frame for complete heart block  ? PERMANENT PACEMAKER INSERTION Left 06/11/2013  ? Procedure: PERMANENT PACEMAKER INSERTION;  Surgeon: Kathleene Hazel, MD;  Location: [facility] CATH LAB;  Service: Cardiovascular;  Laterality: Left;  ? PERMANENT PACEMAKER INSERTION N/A 06/11/2013  ? Procedure: PERMANENT PACEMAKER INSERTION;  Surgeon: Hillis Range, MD;  Location: [facility] CATH LAB;  Service: Cardiovascular;  Laterality: N/A;  ? TEMPORARY PACEMAKER INSERTION N/A 06/11/2013  ? Procedure: TEMPORARY PACEMAKER INSERTION;  Surgeon: Kathleene Hazel, MD;  Location: [facility] CATH LAB;  Service: Cardiovascular;  Laterality: N/A;  ? TUBAL LIGATION    ? ? ?Current Outpatient Medications  ?Medication Sig Dispense Refill  ? acetaminophen (TYLENOL) 500 MG tablet Take 1,000 mg by mouth in the morning and at bedtime.    ? Alcohol Swabs (B-D SINGLE USE SWABS REGULAR) PADS Use as directed for Glucose monitoring Daily 100 each 3  ? ALPRAZolam (XANAX) 1 MG tablet TAKE 1/2 TO 1 TABLET AT BEDTIME ONLY IF NEEDED FOR SLEEP. TRY LIMIT TO 5 DAYS PER WEEK TO AVOID ADDICTION 20 tablet 0  ? Ascorbic Acid (VITAMIN C PO) Take 1 tablet by mouth daily.     ? Calcium Carbonate-Vitamin D (CALCIUM + D PO) Take 1 tablet by mouth daily.     ? Cholecalciferol (VITAMIN D PO) Take 5,000 Units by mouth 2 (two) times a day.     ? citalopram (CELEXA) 20 MG tablet  TAKE 1 TABLET EVERY DAY FOR MOOD 90 tablet 3  ? FOLIC ACID PO Take 1 tablet by mouth daily.     ? hydrochlorothiazide (HYDRODIURIL) 25 MG tablet Take 25 mg by mouth daily as needed (swelling).     ? Hydrocortisone Acetate 1 % OINT Apply to Hemorrhoid 3 to 4  x /day 60 g 3  ? MAGNESIUM PO Take 500 mg by mouth daily.     ? Multiple Vitamin (MULTIVITAMIN) tablet Take 1 tablet by mouth daily.    ? nadolol (CORGARD) 40 MG tablet Take  1 tablet  every Morning for BP & Tremor 90 tablet 3  ? pantoprazole (PROTONIX) 40 MG tablet Take 1 tablet Daily with  Supper for Indigestion & Acid Reflux 90 tablet 3  ? Tretinoin, Facial Wrinkles, 0.05 % CREA APPLY TO AFFECTED AREA EVERY DAY AS DIRECTED 45 g 1  ? ?No current facility-administered medications for this visit.  ? ? ?Allergies:   Levsin [hyoscyamine sulfate], Mobic [meloxicam], Topamax [topiramate], Zoloft [sertraline hcl], Actonel [risedronate sodium], Boniva [ibandronic acid], Evista [raloxifene], and Fosamax [alendronate sodium]  ? ?Social History:  The patient  reports that she quit smoking about 46 years ago. Her smoking use included cigarettes. She has never used smokeless tobacco. She reports that she does not drink alcohol and does not use drugs.  ? ?Family History:  The patient's family history includes Aneurysm in her brother; Breast cancer (age of onset: 40) in her sister; Cancer in her father; Diabetes in her brother, brother, and mother; Heart disease in her brother, brother, and mother; Hypertension in her brother; Stroke in her brother. ? ?ROS:  Please see the history of present illness.   ?All other systems are reviewed and otherwise negative.  ? ?PHYSICAL EXAM: ?VS:  There were no vitals taken for this visit. BMI: There is no height or weight on file to calculate BMI. ?Well nourished, well developed, in no acute distress, looks younger then her age ?HEENT: normocephalic, atraumatic  ?Neck: no JVD, carotid bruits or masses ?Cardiac:  RRR; no significant murmurs,  no rubs, or gallops ?Lungs:  CTA b/l, no wheezing, rhonchi or rales  ?Abd: soft, nontender ?MS: no deformity o atrophy ?Ext: no edema  ?Skin: warm and dry, no rash ?Neuro:  No gross deficits appreciat

## 2022-01-25 ENCOUNTER — Other Ambulatory Visit: Payer: Self-pay

## 2022-01-25 ENCOUNTER — Encounter: Payer: Self-pay | Admitting: Physician Assistant

## 2022-01-25 ENCOUNTER — Ambulatory Visit: Payer: Medicare PPO | Admitting: Physician Assistant

## 2022-01-25 VITALS — BP 112/76 | HR 65 | Ht 65.0 in | Wt 163.0 lb

## 2022-01-25 DIAGNOSIS — I480 Paroxysmal atrial fibrillation: Secondary | ICD-10-CM

## 2022-01-25 DIAGNOSIS — I10 Essential (primary) hypertension: Secondary | ICD-10-CM | POA: Diagnosis not present

## 2022-01-25 DIAGNOSIS — Z950 Presence of cardiac pacemaker: Secondary | ICD-10-CM

## 2022-01-25 LAB — CUP PACEART INCLINIC DEVICE CHECK
Battery Remaining Longevity: 6 mo
Battery Voltage: 2.8 V
Brady Statistic RA Percent Paced: 97 %
Brady Statistic RV Percent Paced: 99.88 %
Date Time Interrogation Session: 20230321132401
Implantable Lead Implant Date: 20140805
Implantable Lead Implant Date: 20140805
Implantable Lead Location: 753859
Implantable Lead Location: 753860
Implantable Lead Model: 1944
Implantable Lead Model: 1948
Implantable Pulse Generator Implant Date: 20140805
Lead Channel Impedance Value: 575 Ohm
Lead Channel Impedance Value: 612.5 Ohm
Lead Channel Pacing Threshold Amplitude: 0.75 V
Lead Channel Pacing Threshold Amplitude: 1.5 V
Lead Channel Pacing Threshold Amplitude: 1.5 V
Lead Channel Pacing Threshold Pulse Width: 0.4 ms
Lead Channel Pacing Threshold Pulse Width: 0.8 ms
Lead Channel Pacing Threshold Pulse Width: 0.8 ms
Lead Channel Sensing Intrinsic Amplitude: 12 mV
Lead Channel Sensing Intrinsic Amplitude: 2.8 mV
Lead Channel Setting Pacing Amplitude: 1 V
Lead Channel Setting Pacing Amplitude: 3 V
Lead Channel Setting Pacing Pulse Width: 0.4 ms
Lead Channel Setting Sensing Sensitivity: 4 mV
Pulse Gen Model: 2240
Pulse Gen Serial Number: 7523768

## 2022-01-25 NOTE — Patient Instructions (Signed)
Medication Instructions:  ? ?Your physician recommends that you continue on your current medications as directed. Please refer to the Current Medication list given to you today. ? ?*If you need a refill on your cardiac medications before your next appointment, please call your pharmacy* ? ? ?Lab Work: NONE ORDERED  TODAY ? ? ?If you have labs (blood work) drawn today and your tests are completely normal, you will receive your results only by: ?MyChart Message (if you have MyChart) OR ?A paper copy in the mail ?If you have any lab test that is abnormal or we need to change your treatment, we will call you to review the results. ? ? ?Testing/Procedures: NONE ORDERED  TODAY ? ? ? ? ?Follow-Up: ?At [facility], you and your health needs are our priority.  As part of our continuing mission to provide you with exceptional heart care, we have created designated Provider Care Teams.  These Care Teams include your primary Cardiologist (physician) and Advanced Practice Providers (APPs -  Physician Assistants and Nurse Practitioners) who all work together to provide you with the care you need, when you need it. ? ?We recommend signing up for the patient portal called "MyChart".  Sign up information is provided on this After Visit Summary.  MyChart is used to connect with patients for Virtual Visits (Telemedicine).  Patients are able to view lab/test results, encounter notes, upcoming appointments, etc.  Non-urgent messages can be sent to your provider as well.   ?To learn more about what you can do with MyChart, go to [email or web address].   ? ?Your next appointment:  DR Shari Prows IN 3 MONTHS  ? ?                                           AND  ? ? ?3 month(s) ? ?The format for your next appointment:   ?In Person ? ?Provider:   ?Francis Dowse, PA-C  ? ? ?Other Instructions ? ?

## 2022-02-02 ENCOUNTER — Other Ambulatory Visit: Payer: Self-pay

## 2022-02-02 DIAGNOSIS — Z1211 Encounter for screening for malignant neoplasm of colon: Secondary | ICD-10-CM

## 2022-02-02 LAB — POC HEMOCCULT BLD/STL (HOME/3-CARD/SCREEN)
Card #2 Fecal Occult Blod, POC: NEGATIVE
Card #3 Fecal Occult Blood, POC: NEGATIVE
Fecal Occult Blood, POC: NEGATIVE

## 2022-02-03 DIAGNOSIS — Z1211 Encounter for screening for malignant neoplasm of colon: Secondary | ICD-10-CM | POA: Diagnosis not present

## 2022-02-03 DIAGNOSIS — Z1212 Encounter for screening for malignant neoplasm of rectum: Secondary | ICD-10-CM | POA: Diagnosis not present

## 2022-02-14 ENCOUNTER — Other Ambulatory Visit: Payer: Self-pay | Admitting: Nurse Practitioner

## 2022-02-14 DIAGNOSIS — R450 Nervousness: Secondary | ICD-10-CM

## 2022-03-01 ENCOUNTER — Other Ambulatory Visit: Payer: Self-pay | Admitting: Internal Medicine

## 2022-03-01 DIAGNOSIS — I10 Essential (primary) hypertension: Secondary | ICD-10-CM

## 2022-03-01 MED ORDER — METOPROLOL SUCCINATE ER 200 MG PO TB24: ORAL_TABLET | ORAL | 3 refills | Status: DC

## 2022-03-10 ENCOUNTER — Ambulatory Visit (INDEPENDENT_AMBULATORY_CARE_PROVIDER_SITE_OTHER): Payer: Medicare PPO

## 2022-03-10 DIAGNOSIS — I480 Paroxysmal atrial fibrillation: Secondary | ICD-10-CM

## 2022-03-10 LAB — CUP PACEART REMOTE DEVICE CHECK
Battery Remaining Longevity: 5 mo
Battery Remaining Percentage: 7 %
Battery Voltage: 2.78 V
Brady Statistic AP VP Percent: 98 %
Brady Statistic AP VS Percent: 1 %
Brady Statistic AS VP Percent: 2.3 %
Brady Statistic AS VS Percent: 1 %
Brady Statistic RA Percent Paced: 97 %
Brady Statistic RV Percent Paced: 99 %
Date Time Interrogation Session: 20230504040020
Implantable Lead Implant Date: 20140805
Implantable Lead Implant Date: 20140805
Implantable Lead Location: 753859
Implantable Lead Location: 753860
Implantable Lead Model: 1944
Implantable Lead Model: 1948
Implantable Pulse Generator Implant Date: 20140805
Lead Channel Impedance Value: 550 Ohm
Lead Channel Impedance Value: 600 Ohm
Lead Channel Pacing Threshold Amplitude: 0.875 V
Lead Channel Pacing Threshold Amplitude: 1.5 V
Lead Channel Pacing Threshold Pulse Width: 0.4 ms
Lead Channel Pacing Threshold Pulse Width: 0.8 ms
Lead Channel Sensing Intrinsic Amplitude: 12 mV
Lead Channel Sensing Intrinsic Amplitude: 2.2 mV
Lead Channel Setting Pacing Amplitude: 1.125
Lead Channel Setting Pacing Amplitude: 3 V
Lead Channel Setting Pacing Pulse Width: 0.4 ms
Lead Channel Setting Sensing Sensitivity: 4 mV
Pulse Gen Model: 2240
Pulse Gen Serial Number: 7523768

## 2022-03-23 NOTE — Progress Notes (Signed)
Remote pacemaker transmission.   

## 2022-04-10 ENCOUNTER — Ambulatory Visit: Payer: Medicare PPO

## 2022-04-12 LAB — CUP PACEART REMOTE DEVICE CHECK
Battery Remaining Longevity: 5 mo
Battery Remaining Percentage: 6 %
Battery Voltage: 2.77 V
Brady Statistic AP VP Percent: 98 %
Brady Statistic AP VS Percent: 1 %
Brady Statistic AS VP Percent: 2.1 %
Brady Statistic AS VS Percent: 1 %
Brady Statistic RA Percent Paced: 97 %
Brady Statistic RV Percent Paced: 99 %
Date Time Interrogation Session: 20230604020020
Implantable Lead Implant Date: 20140805
Implantable Lead Implant Date: 20140805
Implantable Lead Location: 753859
Implantable Lead Location: 753860
Implantable Lead Model: 1944
Implantable Lead Model: 1948
Implantable Pulse Generator Implant Date: 20140805
Lead Channel Impedance Value: 580 Ohm
Lead Channel Impedance Value: 630 Ohm
Lead Channel Pacing Threshold Amplitude: 0.875 V
Lead Channel Pacing Threshold Amplitude: 1.5 V
Lead Channel Pacing Threshold Pulse Width: 0.4 ms
Lead Channel Pacing Threshold Pulse Width: 0.8 ms
Lead Channel Sensing Intrinsic Amplitude: 12 mV
Lead Channel Sensing Intrinsic Amplitude: 2.4 mV
Lead Channel Setting Pacing Amplitude: 1.125
Lead Channel Setting Pacing Amplitude: 3 V
Lead Channel Setting Pacing Pulse Width: 0.4 ms
Lead Channel Setting Sensing Sensitivity: 4 mV
Pulse Gen Model: 2240
Pulse Gen Serial Number: 7523768

## 2022-04-17 NOTE — Progress Notes (Unsigned)
Cardiology Office Note Date:  04/18/2022  Patient ID:  Alexandra Wheeler, Alexandra Wheeler 08-16-1941, MRN [id number] PCP:  Unk Pinto, MD  Cardiologist:  Dr. Aundra Dubin >> Dr. Johney Frame Electrophysiologist: Dr. Rayann Heman    Chief Complaint:  planned f/u  History of Present Illness: Alexandra Wheeler is a 81 y.o. female with history of Afib, CHB w/PPM, HTN, HLD, HOCM.  She comes in today to be seen for Dr. Jonathon Bellows seen by him Feb 2020.  At that time doing well, AF burden <1%, no changes were made.  Discussed possibility of stopping a/c if burden remained low at her next visit   I saw her 12/17/2019 She is doing well.  Denies any CP, palpitations or cardiac awareness, no near syncope or syncope, no SOB She is not exercising but reports she stays moving and busy, recently moved, was very active packing, unpacking, cleaning without exertional intolerances AF burden was ,1% No changes were made  Most recently she saw Dr. Johney Frame 01/25/21, she was doing quite well, discussed recent issue with syncope felt 2/2 HCTZ and dehydration, mentioned Repeat TTE with LVEF 60-65%, no LVOT obstruction, mild SAM with moderate MR. She was exercising, no changes were made, planned to monitor serial echos.  I saw her Nov 2022 She is doing well, turns 27 tomorrow. All in all she is doing OK She wishes she had more energy, feels tired most of the time Sometimes feels like she is a little lightheaded (like now with good BP and HR/rhythm), is a sense of just feeling "off" Once some months ago felt more clearly lightheaded and layed down in bed, felt like something just came over her and she was weak, did not faint and eventually just felt better She has not with these symptoms or otherwise had CP, palpitations or cardiac awareness. She has felt this way probably since she turned 38 a year now, suspects she is just aging. She rarely uses the HCTZ, can not recall the last time she used it She had concerns of  her fatigue and worries given brother died of cancer, urged to follow up with her PMD Labs were done Very low AF burden, discussed restarting A/C vs monitoring, planned to monitor burden RA lead threshold was up some and programmed to maintain 2:1 margin   I saw her March 2023 No changes. Still wishes she had more energy, though does her ADLs at a pace, able to get her things done No CP, palpitations or SOB Her PMD has changed her metoprolol to Nadolol for better management of her tremor She has not yet gotten the nadolol, worries abut her BP No dizzy spells, near syncope or syncope. Discussed her afib burden, 1.3%, she preferred to hold of on resuming a/c. Preferred to establish with a new EP, and planned to see her back in 3 mo with me to follow AF burden and 33mo with Dr. Curt Bears with her battery about 56mo to ERI  TODAY She continues to do well, has a trip to [place] planned end of October, asks about her pacer/battery and if she can go. Denies any cardiac awareness or symptoms. No CP, palpitations No SOB No near syncope or syncope.  Afib hx Diagnosed Nov 2019 AAD None to date  Device information SJM dual chamber PPM, implanted 06/11/2013  Past Medical History:  Diagnosis Date   Anxiety    Atrophic vaginitis    Chest pain    a. Adenosine sestamibi (1/14) with no evidence of ischemia or infarction, EF 63%.  Colon polyps    Complete heart block ([place]) 06-2013   status post pacemaker implantation by Dr Rayann Heman 06-11-2013   Depression    DI (detrusor instability)    GERD (gastroesophageal reflux disease)    History + Hyplori via EGD   H/O echocardiogram    a. Echo 11/2012:EF 50-55%, focal basal septal hypertrophy with some mitral valve SAM (mild) and MR.  This may be a sigmoid septum with advanced age or could be a variant of hypertrophic cardiomyopathy.   HOCM (hypertrophic obstructive cardiomyopathy) ([place])    Echocardiogram 2/22: EF 60-65, no RWMA, severe asymmetric septal  LVH, no LVOT, Gr 1 DD, mild SAM, mod MR, mild LAE, mod to severe TR, Asc Ao 42 mm, normal RVSF, RVSP 28   HTN (hypertension)    Hyperlipidemia    LBBB (left bundle branch block)    Leukopenia 05/19/2014   Lymphocytosis 05/19/2014   Migraines    Myoview    Myoview 12/21: EF 69, no ischemia or infarction; low risk    Osteoporosis    Paroxysmal atrial fibrillation ([place])    Prediabetes    Thoracic aortic aneurysm ([place])    Chest CTA 2/22: Ascending thoracic aortic aneurysm 44 mm; Ao arch 34 mm>>repeat 1 year   Vitamin D deficiency     Past Surgical History:  Procedure Laterality Date   BREAST BIOPSY Left 07/25/2019   ANGIOLIPOMA   BREAST EXCISIONAL BIOPSY Right    BREAST SURGERY     Breast cyst   CATARACT EXTRACTION Left 2019   Dr. Katy Fitch   HEMORRHOID SURGERY     LEFT HEART CATHETERIZATION WITH CORONARY ANGIOGRAM N/A 06/11/2013   Procedure: LEFT HEART CATHETERIZATION WITH CORONARY ANGIOGRAM;  Surgeon: Burnell Blanks, MD;  Location: [facility] CATH LAB;  Service: Cardiovascular;  Laterality: N/A;   PACEMAKER INSERTION  06-11-2013   STJ dual chamber pacemaker implanted by Dr Rayann Heman for complete heart block   PERMANENT PACEMAKER INSERTION Left 06/11/2013   Procedure: PERMANENT PACEMAKER INSERTION;  Surgeon: Burnell Blanks, MD;  Location: [facility] CATH LAB;  Service: Cardiovascular;  Laterality: Left;   PERMANENT PACEMAKER INSERTION N/A 06/11/2013   Procedure: PERMANENT PACEMAKER INSERTION;  Surgeon: Thompson Grayer, MD;  Location: [facility] CATH LAB;  Service: Cardiovascular;  Laterality: N/A;   TEMPORARY PACEMAKER INSERTION N/A 06/11/2013   Procedure: TEMPORARY PACEMAKER INSERTION;  Surgeon: Burnell Blanks, MD;  Location: [facility] CATH LAB;  Service: Cardiovascular;  Laterality: N/A;   TUBAL LIGATION      Current Outpatient Medications  Medication Sig Dispense Refill   acetaminophen (TYLENOL) 500 MG tablet Take 1,000 mg by mouth in the morning and at bedtime.     Alcohol Swabs (B-D SINGLE USE SWABS  REGULAR) PADS Use as directed for Glucose monitoring Daily 100 each 3   ALPRAZolam (XANAX) 1 MG tablet TAKE 1/2 TO 1 TABLET AT BEDTIME ONLY IF NEEDED FOR SLEEP. TRY TO LIMIT TO 5 DAYS PER WEEK TO AVOID ADDICTION 20 tablet 0   Ascorbic Acid (VITAMIN C PO) Take 1 tablet by mouth daily.      Calcium Carbonate-Vitamin D (CALCIUM + D PO) Take 1 tablet by mouth daily.      Cholecalciferol (VITAMIN D PO) Take 5,000 Units by mouth 2 (two) times a day.      citalopram (CELEXA) 20 MG tablet TAKE 1 TABLET EVERY DAY FOR MOOD 90 tablet 3   FOLIC ACID PO Take 1 tablet by mouth daily.      hydrochlorothiazide (HYDRODIURIL) 25 MG tablet Take  25 mg by mouth daily as needed (swelling).      Hydrocortisone Acetate 1 % OINT Apply to Hemorrhoid 3 to 4  x /day 60 g 3   MAGNESIUM PO Take 500 mg by mouth daily.      metoprolol (TOPROL-XL) 200 MG 24 hr tablet TAKE 1 TABLET EVERY DAY FOR BLOOD PRESSURE AND HEART 90 tablet 3   Multiple Vitamin (MULTIVITAMIN) tablet Take 1 tablet by mouth daily.     Multiple Vitamins-Minerals (ZINC PO) Take 200 mg by mouth daily.     pantoprazole (PROTONIX) 40 MG tablet Take 1 tablet Daily with Supper for Indigestion & Acid Reflux 90 tablet 3   Tretinoin, Facial Wrinkles, 0.05 % CREA APPLY TO AFFECTED AREA EVERY DAY AS DIRECTED 45 g 1   No current facility-administered medications for this visit.    Allergies:   Levsin [hyoscyamine sulfate], Mobic [meloxicam], Topamax [topiramate], Zoloft [sertraline hcl], Actonel [risedronate sodium], Boniva [ibandronic acid], Evista [raloxifene], and Fosamax [alendronate sodium]   Social History:  The patient  reports that she quit smoking about 46 years ago. Her smoking use included cigarettes. She has never used smokeless tobacco. She reports that she does not drink alcohol and does not use drugs.   Family History:  The patient's family history includes Aneurysm in her brother; Breast cancer (age of onset: 11) in her sister; Cancer in her father;  Diabetes in her brother, brother, and mother; Heart disease in her brother, brother, and mother; Hypertension in her brother; Stroke in her brother.  ROS:  Please see the history of present illness.   All other systems are reviewed and otherwise negative.   PHYSICAL EXAM: VS:  BP 134/84   Pulse 71   Ht 5\' 5"  (1.651 m)   Wt 167 lb (75.8 kg)   SpO2 95%   BMI 27.79 kg/m  BMI: Body mass index is 27.79 kg/m. Well nourished, well developed, in no acute distress, looks younger then her age 87: normocephalic, atraumatic  Neck: no JVD, carotid bruits or masses Cardiac:  RRR; no significant murmurs, no rubs, or gallops Lungs:  CTA b/l, no wheezing, rhonchi or rales  Abd: soft, nontender MS: no deformity o atrophy Ext: no edema  Skin: warm and dry, no rash Neuro:  No gross deficits appreciated Psych: euthymic mood, full affect  PPM site is stable, no tenderness, fluctuation or tethering   EKG:  not done today   PPM interrogation done today and reviewed by myself:  Battery estimate is 4.78mo to ERI Lead measurements are good No new AF epissodes   12/09/2020: TTE  1. Left ventricular ejection fraction, by estimation, is 60 to 65%. The  left ventricle has normal function. The left ventricle demonstrates  regional wall motion abnormalities with septal-lateral dyssynchrony. There  is severe asymmetric basal to mid  septal left ventricular hypertrophy. No LV outflow gradient. Left  ventricular diastolic parameters are consistent with Grade I diastolic  dysfunction (impaired relaxation).   2. The mitral valve shows mild systolic anterior motion. Moderate mitral  valve regurgitation. No evidence of mitral stenosis.   3. Left atrial size was mildly dilated.   4. Tricuspid valve regurgitation is moderate to severe.   5. The aortic valve is tricuspid. Aortic valve regurgitation is trivial.  No aortic stenosis is present.   6. Aortic dilatation noted. There is mild dilatation of the  ascending  aorta, measuring 42 mm.   7. Right ventricular systolic function is normal. The right ventricular  size is  mildly enlarged. There is normal pulmonary artery systolic  pressure. The estimated right ventricular systolic pressure is [id number] mmHg.   8. The inferior vena cava is normal in size with greater than 50%  respiratory variability, suggesting right atrial pressure of 3 mmHg.   9. This echo is consistent with hypertrophic cardiomyopathy.      11/05/2020: Stress myoview Nuclear stress EF: 69%. Blood pressure demonstrated a normal response to exercise. The study is normal. This is a low risk study. The left ventricular ejection fraction is hyperdynamic (>65%).   Negative nuclear medicine stress test for ischemia or infarction.  03/10/2017: TTE Study Conclusions  - Left ventricle: The cavity size was normal. There was severe    focal basal hypertrophy of the septum and moderate hypertrophy of    the remaining myocardium. Systolic function was normal. The    estimated ejection fraction was in the range of 60% to 65%. Wall    motion was normal; there were no regional wall motion    abnormalities. Doppler parameters are consistent with abnormal    left ventricular relaxation (grade 1 diastolic dysfunction).    Doppler parameters are consistent with elevated ventricular    end-diastolic filling pressure.  - Aortic valve: There was mild regurgitation.  - Mitral valve: There was trivial regurgitation.  - Right ventricle: The cavity size was normal. Wall thickness was    normal. Systolic function was normal.  - Right atrium: The atrium was normal in size.  - Tricuspid valve: There was moderate regurgitation.  - Pulmonary arteries: Systolic pressure was mildly increased. PA    peak pressure: 38 mm Hg (S).  - Inferior vena cava: The vessel was normal in size.  - Pericardium, extracardiac: There was no pericardial effusion.   Impressions:   - Findings consistent with HCM. No  difference since the study on    07/31/2015.     11.30.2016: ETT Fair exercise capacity.  No chest pain.  Normal BP response to exercise (139/91 >> 170/99 >> 169/94). Paced rhythm.  ECG could not be interpreted. There were frequent PVCs. No exercise induced ventricular arrhythmias.     Recent Labs: 01/17/2022: ALT 19; BUN 13; Creat 0.68; Hemoglobin 13.1; Magnesium 2.4; Platelets 201; Potassium 4.8; Sodium 142; TSH 0.65  01/17/2022: Cholesterol 189; HDL 47; LDL Cholesterol (Calc) 114; Total CHOL/HDL Ratio 4.0; Triglycerides 161   CrCl cannot be calculated (Patient's most recent lab result is older than the maximum 21 days allowed.).   Wt Readings from Last 3 Encounters:  04/18/22 167 lb (75.8 kg)  01/25/22 163 lb (73.9 kg)  01/17/22 161 lb 3.2 oz (73.1 kg)     Other studies reviewed: Additional studies/records reviewed today include: summarized above  ASSESSMENT AND PLAN:  1. PPM     Intact function     No programming changes made     Nearing ERI  She is on monthly battery checks Will see her back Early October for battery check up again prior to her trip, though she should be ok She would like to transition to another EP given Dr. Jackalyn Lombard slow down/eventual departure.  2. Paroxysmal Afib     CHA2DS2Vasc is 4, no longer on a/c      No new episodes since Jan 2023, <1%       We will monitor burden   3. HTN     Her home cuff is acurate     Looks good        Disposition: she will transition to  Dr. Curt Bears, see him in 3 mo ahead of her gen change   Current medicines are reviewed at length with the patient today.  The patient did not have any concerns regarding medicines.  Venetia Night, PA-C 04/18/2022 12:17 PM     [address] [place] [address] [phone] (office)  [phone] (fax)

## 2022-04-18 ENCOUNTER — Encounter: Payer: Self-pay | Admitting: Physician Assistant

## 2022-04-18 ENCOUNTER — Ambulatory Visit (INDEPENDENT_AMBULATORY_CARE_PROVIDER_SITE_OTHER): Payer: Medicare PPO | Admitting: Physician Assistant

## 2022-04-18 VITALS — BP 134/84 | HR 71 | Ht 65.0 in | Wt 167.0 lb

## 2022-04-18 DIAGNOSIS — Z950 Presence of cardiac pacemaker: Secondary | ICD-10-CM

## 2022-04-18 DIAGNOSIS — I480 Paroxysmal atrial fibrillation: Secondary | ICD-10-CM | POA: Diagnosis not present

## 2022-04-18 DIAGNOSIS — I10 Essential (primary) hypertension: Secondary | ICD-10-CM | POA: Diagnosis not present

## 2022-04-18 LAB — CUP PACEART INCLINIC DEVICE CHECK
Battery Remaining Longevity: 4 mo
Battery Voltage: 2.77 V
Brady Statistic RA Percent Paced: 97 %
Brady Statistic RV Percent Paced: 99.91 %
Date Time Interrogation Session: 20230612113000
Implantable Lead Implant Date: 20140805
Implantable Lead Implant Date: 20140805
Implantable Lead Location: 753859
Implantable Lead Location: 753860
Implantable Lead Model: 1944
Implantable Lead Model: 1948
Implantable Pulse Generator Implant Date: 20140805
Lead Channel Impedance Value: 575 Ohm
Lead Channel Impedance Value: 612.5 Ohm
Lead Channel Pacing Threshold Amplitude: 0.625 V
Lead Channel Pacing Threshold Amplitude: 1.5 V
Lead Channel Pacing Threshold Amplitude: 1.5 V
Lead Channel Pacing Threshold Pulse Width: 0.4 ms
Lead Channel Pacing Threshold Pulse Width: 0.8 ms
Lead Channel Pacing Threshold Pulse Width: 0.8 ms
Lead Channel Sensing Intrinsic Amplitude: 12 mV
Lead Channel Sensing Intrinsic Amplitude: 2.8 mV
Lead Channel Setting Pacing Amplitude: 0.875
Lead Channel Setting Pacing Amplitude: 3 V
Lead Channel Setting Pacing Pulse Width: 0.4 ms
Lead Channel Setting Sensing Sensitivity: 4 mV
Pulse Gen Model: 2240
Pulse Gen Serial Number: 7523768

## 2022-04-18 NOTE — Patient Instructions (Signed)
Medication Instructions:   Your physician recommends that you continue on your current medications as directed. Please refer to the Current Medication list given to you today.    *If you need a refill on your cardiac medications before your next appointment, please call your pharmacy*   Lab Work: NONE ORDERED  TODAY    If you have labs (blood work) drawn today and your tests are completely normal, you will receive your results only by: MyChart Message (if you have MyChart) OR A paper copy in the mail If you have any lab test that is abnormal or we need to change your treatment, we will call you to review the results.   Testing/Procedures: NONE ORDERED  TODAY    Follow-Up: At [facility], you and your health needs are our priority.  As part of our continuing mission to provide you with exceptional heart care, we have created designated Provider Care Teams.  These Care Teams include your primary Cardiologist (physician) and Advanced Practice Providers (APPs -  Physician Assistants and Nurse Practitioners) who all work together to provide you with the care you need, when you need it.  We recommend signing up for the patient portal called "MyChart".  Sign up information is provided on this After Visit Summary.  MyChart is used to connect with patients for Virtual Visits (Telemedicine).  Patients are able to view lab/test results, encounter notes, upcoming appointments, etc.  Non-urgent messages can be sent to your provider as well.   To learn more about what you can do with MyChart, go to [email or web address].    Your next appointment:    3 month(s)  EARLY OCTOBER   The format for your next appointment:   In Person  Provider:   Loman Brooklyn, MD{      Other Instructions   Important Information About Sugar

## 2022-04-28 NOTE — Progress Notes (Unsigned)
Cardiology Office Note:    Date:  04/28/2022   ID:  [name], [name] 08-10-1941, MRN [id number]  PCP:  Unk Pinto, [place]  Cardiologist:  Freada Bergeron, MD  Advanced Practice Provider:  Liliane Shi, PA-C Electrophysiologist:  Thompson Grayer, MD    Referring MD: Unk Pinto, MD     History of Present Illness:    Alexandra Wheeler is a 81 y.o. female with a hx of paroxysmal Afib, CHB s/p PPM placement 06/2013, HTN, HLD, LBBB, non-obstructive CAD noted on cath in 2014, hypertrophic CM without LVOT obstruction who is followed by Dr. Rayann Heman who now presents to clinic for follow-up.  Patient was previously followed by Dr. Aundra Dubin and last seen in clinic in 2018. She has a hx of HCM with asymmetric septal hypertrophy, MV systolic anterior motion and no significant LVOT gradient at rest.  She had a low risk ETT in 2016.  Her last echocardiogram was in 2018 and was c/w HCM.  She saw Dr. Rayann Heman in 12/2018 and was last seen by EP in 12/2019 by Tommye Standard, PA-C.  Of note, her Apixaban was DC'd in 08/2019 due to low burden of AF on her pacer.    She was seen by Richardson Dopp on 10/26/20 after calling with episode of chest pain and near syncope. Her device was interrogated and no abnormalities were detected. Thought her symptoms were related to taking extra doses of HCTZ. TTE stable with no LVOT gradient, G1DD, mild SAM with mild MR.  Was last seen in clinic on 04/18/22 by Tommye Standard, PA where she was doing well. No palpitations or syncope.  Past Medical History:  Diagnosis Date   Anxiety    Atrophic vaginitis    Chest pain    a. Adenosine sestamibi (1/14) with no evidence of ischemia or infarction, EF 63%.    Colon polyps    Complete heart block ([place]) 06-2013   status post pacemaker implantation by Dr Rayann Heman 06-11-2013   Depression    DI (detrusor instability)    GERD (gastroesophageal reflux disease)    History + Hyplori via  EGD   H/O echocardiogram    a. Echo 11/2012:EF 50-55%, focal basal septal hypertrophy with some mitral valve SAM (mild) and MR.  This may be a sigmoid septum with advanced age or could be a variant of hypertrophic cardiomyopathy.   HOCM (hypertrophic obstructive cardiomyopathy) ([place])    Echocardiogram 2/22: EF 60-65, no RWMA, severe asymmetric septal LVH, no LVOT, Gr 1 DD, mild SAM, mod MR, mild LAE, mod to severe TR, Asc Ao 42 mm, normal RVSF, RVSP 28   HTN (hypertension)    Hyperlipidemia    LBBB (left bundle branch block)    Leukopenia 05/19/2014   Lymphocytosis 05/19/2014   Migraines    Myoview    Myoview 12/21: EF 69, no ischemia or infarction; low risk    Osteoporosis    Paroxysmal atrial fibrillation ([place])    Prediabetes    Thoracic aortic aneurysm ([place])    Chest CTA 2/22: Ascending thoracic aortic aneurysm 44 mm; Ao arch 34 mm>>repeat 1 year   Vitamin D deficiency     Past Surgical History:  Procedure Laterality Date   BREAST BIOPSY Left 07/25/2019   ANGIOLIPOMA   BREAST EXCISIONAL BIOPSY Right    BREAST SURGERY     Breast cyst   CATARACT EXTRACTION Left 2019   Dr. Katy Fitch   HEMORRHOID SURGERY  LEFT HEART CATHETERIZATION WITH CORONARY ANGIOGRAM N/A 06/11/2013   Procedure: LEFT HEART CATHETERIZATION WITH CORONARY ANGIOGRAM;  Surgeon: Burnell Blanks, MD;  Location[facility] CATH LAB;  Service: Cardiovascular;  Laterality: N/A;   PACEMAKER INSERTION  06-11-2013   STJ dual chamber pacemaker implanted by Dr Rayann Heman for complete heart block   PERMANENT PACEMAKER INSERTION Left 06/11/2013   Procedure: PERMANENT PACEMAKER INSERTION;  Surgeon: Burnell Blanks, MD;  Location: [facility] CATH LAB;  Service: Cardiovascular;  Laterality: Left;   PERMANENT PACEMAKER INSERTION N/A 06/11/2013   Procedure: PERMANENT PACEMAKER INSERTION;  Surgeon: Thompson Grayer, MD;  Location: [facility] CATH LAB;  Service: Cardiovascular;  Laterality: N/A;   TEMPORARY PACEMAKER INSERTION N/A 06/11/2013   Procedure:  TEMPORARY PACEMAKER INSERTION;  Surgeon: Burnell Blanks, MD;  Location: [facility] CATH LAB;  Service: Cardiovascular;  Laterality: N/A;   TUBAL LIGATION      Current Medications: No outpatient medications have been marked as taking for the 04/29/22 encounter (Appointment) with Freada Bergeron, MD.     Allergies:   Levsin [hyoscyamine sulfate], Mobic [meloxicam], Topamax [topiramate], Zoloft [sertraline hcl], Actonel [risedronate sodium], Boniva [ibandronic acid], Evista [raloxifene], and Fosamax [alendronate sodium]   Social History   Socioeconomic History   Marital status: Widowed    Spouse name: Not on file   Number of children: Not on file   Years of education: Not on file   Highest education level: Not on file  Occupational History   Occupation: Part-time at [employer]   Smoking status: Former    Types: Cigarettes    Quit date: 10/09/1975    Years since quitting: 46.5   Smokeless tobacco: Never  Vaping Use   Vaping Use: Never used  Substance and Sexual Activity   Alcohol use: No   Drug use: No   Sexual activity: Never    Birth control/protection: Surgical, Post-menopausal  Other Topics Concern   Not on file  Social History Narrative   Lives alone. Dtr in the area.   Social Determinants of Health   Financial Resource Strain: Not on file  Food Insecurity: Not on file  Transportation Needs: Not on file  Physical Activity: Not on file  Stress: Not on file  Social Connections: Not on file     Family History: The patient's family history includes Aneurysm in her brother; Breast cancer (age of onset: 19) in her sister; Cancer in her father; Diabetes in her brother, brother, and mother; Heart disease in her brother, brother, and mother; Hypertension in her brother; Stroke in her brother.  ROS:   Please see the history of present illness.    Review of Systems  Constitutional:  Negative for chills and fever.  HENT:  Negative for hearing loss.   Eyes:   Negative for blurred vision and redness.  Respiratory:  Negative for shortness of breath.   Cardiovascular:  Negative for chest pain, palpitations, orthopnea, claudication, leg swelling and PND.  Gastrointestinal:  Positive for heartburn. Negative for abdominal pain and melena.  Genitourinary:  Negative for dysuria and flank pain.  Musculoskeletal:  Positive for myalgias. Negative for falls.  Neurological:  Negative for dizziness and loss of consciousness.  Endo/Heme/Allergies:  Negative for polydipsia.  Psychiatric/Behavioral:  Negative for substance abuse.     EKGs/Labs/Other Studies Reviewed:    The following studies were reviewed today:  TTE 12/29/20: 1. Left ventricular ejection fraction, by estimation, is 60 to 65%. The  left ventricle has normal function. The left ventricle demonstrates  regional  wall motion abnormalities with septal-lateral dyssynchrony. There  is severe asymmetric basal to mid  septal left ventricular hypertrophy. No LV outflow gradient. Left  ventricular diastolic parameters are consistent with Grade I diastolic  dysfunction (impaired relaxation).   2. The mitral valve shows mild systolic anterior motion. Moderate mitral  valve regurgitation. No evidence of mitral stenosis.   3. Left atrial size was mildly dilated.   4. Tricuspid valve regurgitation is moderate to severe.   5. The aortic valve is tricuspid. Aortic valve regurgitation is trivial.  No aortic stenosis is present.   6. Aortic dilatation noted. There is mild dilatation of the ascending  aorta, measuring 42 mm.   7. Right ventricular systolic function is normal. The right ventricular  size is mildly enlarged. There is normal pulmonary artery systolic  pressure. The estimated right ventricular systolic pressure is 28.0 mmHg.   8. The inferior vena cava is normal in size with greater than 50%  respiratory variability, suggesting right atrial pressure of 3 mmHg.   9. This echo is consistent with  hypertrophic cardiomyopathy.  Echocardiogram 03/10/2017 Severe focal basal septal hypertrophy, moderate hypertrophy of the myocardium, EF 60-65, normal wall motion, GR 1 DD, mild AI, trivial MR, normal RVSF, moderate TR, PASP 38   ETT 09/2015 Normal BP response, PVCs, no NSVT; low risk study   Echocardiogram 07/31/2015 Moderate asymmetric septal hypertrophy, EF 55-60, normal wall motion, G1 DD, mild AI, moderate S.A.M., moderate MR, moderate TR, peak LVOT gradient 9 mmHg   Cardiac catheterization 06/11/2013 LM ostial 10 LAD mid mild irregularities EF 50-55   Myoview 11/26/2012 EF 63, no ischemia   Recent Labs: 01/17/2022: ALT 19; BUN 13; Creat 0.68; Hemoglobin 13.1; Magnesium 2.4; Platelets 201; Potassium 4.8; Sodium 142; TSH 0.65  Recent Lipid Panel    Component Value Date/Time   CHOL 189 01/17/2022 1523   TRIG 161 (H) 01/17/2022 1523   HDL 47 (L) 01/17/2022 1523   CHOLHDL 4.0 01/17/2022 1523   VLDL 20 04/11/2017 1025   LDLCALC 114 (H) 01/17/2022 1523     Risk Assessment/Calculations:    CHA2DS2-VASc Score =    This indicates a  % annual risk of stroke. The patient's score is based upon:      Physical Exam:    VS:  There were no vitals taken for this visit.    Wt Readings from Last 3 Encounters:  04/18/22 167 lb (75.8 kg)  01/25/22 163 lb (73.9 kg)  01/17/22 161 lb 3.2 oz (73.1 kg)     GEN:  Well nourished, well developed in no acute distress HEENT: Normal NECK: No JVD; No carotid bruits CARDIAC: RRR, 1/6 systolic murmur, no rubs or gallops RESPIRATORY:  Clear to auscultation without rales, wheezing or rhonchi  ABDOMEN: Soft, non-tender, non-distended MUSCULOSKELETAL:  No edema; No deformity  SKIN: Warm and dry NEUROLOGIC:  Alert and oriented x 3 PSYCHIATRIC:  Normal affect   ASSESSMENT:    No diagnosis found.  PLAN:    In order of problems listed above:  #HCM: No LVOT obstruction on TTE. Low risk ETT in 2016. No MRI due to PPM. Episode of syncope in  2022 appeared to be secondary to taking extra doses of HCTZ with resultant dehydration. No recurrence of symptoms. Repeat TTE with LVEF 60-65%, no LVOT obstruction, mild SAM with moderate MR. -Continue metop 200mg  XL daily -ETT low risk -No arrhythmias on cardiac monitor -Maintain adequate hydration -Only using HCTZ as needed for LE edema  #Chest Pain: Cath 2014  with minimal plaque. Exercise myoview negative for ischemia and normal EF.  TTE 12/2020 with normal LVEF, severe asymmetric basal to mid septal LVH, no LVOT gradient, G1DD, mild SAM with moderate MR. Symptoms are not exertional and usually occur at night. May be related to GERD. -Low suspicion for cardiac etiology -Continue management of GERD  #Complete Heart Block: S/p PPM placement. -Follow-up with Dr. Johney Frame as scheduled   #Moderate MR with mild SAM: No HF symptoms.  -Repeat TTE for moniroring  #HTN: -Continue metop 200mg  XL daily  #Paroxysmal Afib: Low burden. Not currently on [facility]. -Continue to monitor (has PPM for continuous monitoring) -Follow-up with [facility] as scheduled  #HLD: Intolerance to statins. -Continue zetia 10mg  daily     Medication Adjustments/Labs and Tests Ordered: Current medicines are reviewed at length with the patient today.  Concerns regarding medicines are outlined above.  No orders of the defined types were placed in this encounter.  No orders of the defined types were placed in this encounter.   There are no Patient Instructions on file for this visit.    Signed, Johney Frame, MD  04/28/2022 12:21 PM    [facility]

## 2022-04-29 ENCOUNTER — Encounter: Payer: Self-pay | Admitting: Cardiology

## 2022-04-29 ENCOUNTER — Ambulatory Visit: Payer: Medicare PPO | Admitting: Cardiology

## 2022-04-29 VITALS — BP 130/80 | HR 62 | Ht 65.0 in | Wt 167.6 lb

## 2022-04-29 DIAGNOSIS — Z950 Presence of cardiac pacemaker: Secondary | ICD-10-CM

## 2022-04-29 DIAGNOSIS — I071 Rheumatic tricuspid insufficiency: Secondary | ICD-10-CM

## 2022-04-29 DIAGNOSIS — I442 Atrioventricular block, complete: Secondary | ICD-10-CM | POA: Diagnosis not present

## 2022-04-29 DIAGNOSIS — I421 Obstructive hypertrophic cardiomyopathy: Secondary | ICD-10-CM

## 2022-04-29 DIAGNOSIS — I480 Paroxysmal atrial fibrillation: Secondary | ICD-10-CM | POA: Diagnosis not present

## 2022-04-29 DIAGNOSIS — I340 Nonrheumatic mitral (valve) insufficiency: Secondary | ICD-10-CM | POA: Diagnosis not present

## 2022-04-29 DIAGNOSIS — I10 Essential (primary) hypertension: Secondary | ICD-10-CM

## 2022-04-29 MED ORDER — EZETIMIBE 10 MG PO TABS: 10.0000 mg | ORAL_TABLET | Freq: Every day | ORAL | 1 refills | Status: DC

## 2022-05-09 ENCOUNTER — Other Ambulatory Visit: Payer: Self-pay | Admitting: Nurse Practitioner

## 2022-05-09 DIAGNOSIS — R450 Nervousness: Secondary | ICD-10-CM

## 2022-05-11 ENCOUNTER — Ambulatory Visit (INDEPENDENT_AMBULATORY_CARE_PROVIDER_SITE_OTHER): Payer: Medicare PPO

## 2022-05-11 DIAGNOSIS — I442 Atrioventricular block, complete: Secondary | ICD-10-CM | POA: Diagnosis not present

## 2022-05-14 LAB — CUP PACEART REMOTE DEVICE CHECK
Battery Remaining Longevity: 4 mo
Battery Remaining Percentage: 5 %
Battery Voltage: 2.75 V
Brady Statistic AP VP Percent: 99 %
Brady Statistic AP VS Percent: 1 %
Brady Statistic AS VP Percent: 1.4 %
Brady Statistic AS VS Percent: 1 %
Brady Statistic RA Percent Paced: 99 %
Brady Statistic RV Percent Paced: 99 %
Date Time Interrogation Session: 20230705020014
Implantable Lead Implant Date: 20140805
Implantable Lead Implant Date: 20140805
Implantable Lead Location: 753859
Implantable Lead Location: 753860
Implantable Lead Model: 1944
Implantable Lead Model: 1948
Implantable Pulse Generator Implant Date: 20140805
Lead Channel Impedance Value: 540 Ohm
Lead Channel Impedance Value: 580 Ohm
Lead Channel Pacing Threshold Amplitude: 0.75 V
Lead Channel Pacing Threshold Amplitude: 1.5 V
Lead Channel Pacing Threshold Pulse Width: 0.4 ms
Lead Channel Pacing Threshold Pulse Width: 0.8 ms
Lead Channel Sensing Intrinsic Amplitude: 12 mV
Lead Channel Sensing Intrinsic Amplitude: 2.6 mV
Lead Channel Setting Pacing Amplitude: 1 V
Lead Channel Setting Pacing Amplitude: 3 V
Lead Channel Setting Pacing Pulse Width: 0.4 ms
Lead Channel Setting Sensing Sensitivity: 4 mV
Pulse Gen Model: 2240
Pulse Gen Serial Number: 7523768

## 2022-05-19 ENCOUNTER — Ambulatory Visit (HOSPITAL_COMMUNITY): Payer: Medicare PPO | Attending: Internal Medicine

## 2022-05-19 DIAGNOSIS — I421 Obstructive hypertrophic cardiomyopathy: Secondary | ICD-10-CM | POA: Diagnosis not present

## 2022-05-19 DIAGNOSIS — I340 Nonrheumatic mitral (valve) insufficiency: Secondary | ICD-10-CM | POA: Diagnosis not present

## 2022-05-19 LAB — ECHOCARDIOGRAM COMPLETE
Area-P 1/2: 2.8 cm2
P 1/2 time: 760 msec
S' Lateral: 2.7 cm

## 2022-05-23 ENCOUNTER — Telehealth: Payer: Self-pay | Admitting: *Deleted

## 2022-05-23 DIAGNOSIS — I77819 Aortic ectasia, unspecified site: Secondary | ICD-10-CM

## 2022-05-23 DIAGNOSIS — I340 Nonrheumatic mitral (valve) insufficiency: Secondary | ICD-10-CM

## 2022-05-23 DIAGNOSIS — I071 Rheumatic tricuspid insufficiency: Secondary | ICD-10-CM

## 2022-05-23 DIAGNOSIS — I77810 Thoracic aortic ectasia: Secondary | ICD-10-CM

## 2022-05-23 NOTE — Telephone Encounter (Signed)
-----   Message from Meriam Sprague, MD sent at 05/22/2022  2:34 PM EDT ----- Her echo shows normal pumping function. Her mitral valve is mildly-to-moderately leaky on my review and her tricuspid valve leaks a moderate amount. Her aorta is stably dilated which we will continue to monitor with yearly CT scans. We will repeat her echo in 2 years for continued monitoring but overall, things look stable.

## 2022-05-23 NOTE — Telephone Encounter (Signed)
The patient has been notified of the result and verbalized understanding.  All questions (if any) were answered.  Pt aware I will go ahead and place the order for  CT ANGIO CHEST AORTA in the system to be done in one year, and send our [facility] Schedulers a message to call her back and arrange this appt at around that time.  Future BMET ordered as well.   Will go ahead and order for the pt to have a repeat echo in 2 years in the system, and shoot a message to our Echo Scheduler to call her back closer to that time to arrange that test as well.  Pt verbalized understanding and agrees with this plan.

## 2022-06-01 NOTE — Progress Notes (Signed)
Remote pacemaker transmission.   

## 2022-06-11 ENCOUNTER — Ambulatory Visit: Payer: Medicare PPO

## 2022-06-14 ENCOUNTER — Other Ambulatory Visit: Payer: Self-pay | Admitting: Internal Medicine

## 2022-06-14 LAB — CUP PACEART REMOTE DEVICE CHECK
Battery Remaining Longevity: 3 mo
Battery Remaining Percentage: 4 %
Battery Voltage: 2.72 V
Brady Statistic AP VP Percent: 97 %
Brady Statistic AP VS Percent: 1 %
Brady Statistic AS VP Percent: 2.6 %
Brady Statistic AS VS Percent: 1 %
Brady Statistic RA Percent Paced: 97 %
Brady Statistic RV Percent Paced: 99 %
Date Time Interrogation Session: 20230805020014
Implantable Lead Implant Date: 20140805
Implantable Lead Implant Date: 20140805
Implantable Lead Location: 753859
Implantable Lead Location: 753860
Implantable Lead Model: 1944
Implantable Lead Model: 1948
Implantable Pulse Generator Implant Date: 20140805
Lead Channel Impedance Value: 560 Ohm
Lead Channel Impedance Value: 610 Ohm
Lead Channel Pacing Threshold Amplitude: 0.625 V
Lead Channel Pacing Threshold Amplitude: 1.5 V
Lead Channel Pacing Threshold Pulse Width: 0.4 ms
Lead Channel Pacing Threshold Pulse Width: 0.8 ms
Lead Channel Sensing Intrinsic Amplitude: 12 mV
Lead Channel Sensing Intrinsic Amplitude: 2.6 mV
Lead Channel Setting Pacing Amplitude: 0.875
Lead Channel Setting Pacing Amplitude: 3 V
Lead Channel Setting Pacing Pulse Width: 0.4 ms
Lead Channel Setting Sensing Sensitivity: 4 mV
Pulse Gen Model: 2240
Pulse Gen Serial Number: 7523768

## 2022-06-14 MED ORDER — TRETINOIN (FACIAL WRINKLES) 0.05 % EX CREA: TOPICAL_CREAM | CUTANEOUS | 11 refills | Status: DC

## 2022-06-21 ENCOUNTER — Ambulatory Visit: Payer: Medicare PPO | Admitting: Nurse Practitioner

## 2022-06-27 ENCOUNTER — Other Ambulatory Visit: Payer: Self-pay | Admitting: Internal Medicine

## 2022-06-27 MED ORDER — TRETINOIN (FACIAL WRINKLES) 0.05 % EX CREA
TOPICAL_CREAM | CUTANEOUS | 11 refills | Status: DC
Start: 2022-06-27 — End: 2022-07-18

## 2022-06-27 MED ORDER — TRETINOIN (FACIAL WRINKLES) 0.05 % EX CREA: TOPICAL_CREAM | CUTANEOUS | 11 refills | Status: DC

## 2022-07-12 ENCOUNTER — Ambulatory Visit (INDEPENDENT_AMBULATORY_CARE_PROVIDER_SITE_OTHER): Payer: Medicare PPO

## 2022-07-12 DIAGNOSIS — I442 Atrioventricular block, complete: Secondary | ICD-10-CM

## 2022-07-12 LAB — CUP PACEART REMOTE DEVICE CHECK
Battery Remaining Longevity: 3 mo
Battery Remaining Percentage: 3 %
Battery Voltage: 2.71 V
Brady Statistic AP VP Percent: 97 %
Brady Statistic AP VS Percent: 1 %
Brady Statistic AS VP Percent: 2.7 %
Brady Statistic AS VS Percent: 1 %
Brady Statistic RA Percent Paced: 97 %
Brady Statistic RV Percent Paced: 99 %
Date Time Interrogation Session: 20230905020013
Implantable Lead Implant Date: 20140805
Implantable Lead Implant Date: 20140805
Implantable Lead Location: 753859
Implantable Lead Location: 753860
Implantable Lead Model: 1944
Implantable Lead Model: 1948
Implantable Pulse Generator Implant Date: 20140805
Lead Channel Impedance Value: 590 Ohm
Lead Channel Impedance Value: 610 Ohm
Lead Channel Pacing Threshold Amplitude: 0.75 V
Lead Channel Pacing Threshold Amplitude: 1.5 V
Lead Channel Pacing Threshold Pulse Width: 0.4 ms
Lead Channel Pacing Threshold Pulse Width: 0.8 ms
Lead Channel Sensing Intrinsic Amplitude: 12 mV
Lead Channel Sensing Intrinsic Amplitude: 3.1 mV
Lead Channel Setting Pacing Amplitude: 1 V
Lead Channel Setting Pacing Amplitude: 3 V
Lead Channel Setting Pacing Pulse Width: 0.4 ms
Lead Channel Setting Sensing Sensitivity: 4 mV
Pulse Gen Model: 2240
Pulse Gen Serial Number: 7523768

## 2022-07-18 ENCOUNTER — Other Ambulatory Visit: Payer: Self-pay

## 2022-07-18 MED ORDER — TRETINOIN (FACIAL WRINKLES) 0.05 % EX CREA: TOPICAL_CREAM | CUTANEOUS | 11 refills | Status: DC

## 2022-07-20 ENCOUNTER — Other Ambulatory Visit: Payer: Self-pay | Admitting: Internal Medicine

## 2022-07-20 ENCOUNTER — Ambulatory Visit
Admission: RE | Admit: 2022-07-20 | Discharge: 2022-07-20 | Disposition: A | Discharge status: Home / Self Care | Payer: Medicare PPO | Source: Ambulatory Visit | Attending: Internal Medicine | Admitting: Internal Medicine

## 2022-07-20 DIAGNOSIS — M810 Age-related osteoporosis without current pathological fracture: Secondary | ICD-10-CM | POA: Diagnosis not present

## 2022-07-20 DIAGNOSIS — M816 Localized osteoporosis [Lequesne]: Secondary | ICD-10-CM

## 2022-07-20 DIAGNOSIS — Z780 Asymptomatic menopausal state: Secondary | ICD-10-CM | POA: Diagnosis not present

## 2022-07-20 DIAGNOSIS — R450 Nervousness: Secondary | ICD-10-CM

## 2022-07-20 MED ORDER — ALPRAZOLAM 0.5 MG PO TABS: ORAL_TABLET | ORAL | 0 refills | Status: DC

## 2022-07-20 NOTE — Progress Notes (Signed)
<><><><><><><><><><><><><><><><><><><><><><><><><><><><><><><><><> <><><><><><><><><><><><><><><><><><><><><><><><><><><><><><><><><> -   Test results slightly outside the reference range are not unusual. If there is anything important, I will review this with you,  otherwise it is considered normal test values.  If you have further questions,  please do not hesitate to contact me at the office or via My Chart.  <><><><><><><><><><><><><><><><><><><><><><><><><><><><><><><><><> <><><><><><><><><><><><><><><><><><><><><><><><><><><><><><><><><>  -  Osteoporosis worse .   Recommend Office Visit to discuss various treatments  <><><><><><><><><><><><><><><><><><><><><><><><><><><><><><><><><> <><><><><><><><><><><><><><><><><><><><><><><><><><><><><><><><><>

## 2022-07-21 ENCOUNTER — Other Ambulatory Visit: Payer: Self-pay | Admitting: Internal Medicine

## 2022-07-21 ENCOUNTER — Telehealth: Payer: Self-pay | Admitting: Internal Medicine

## 2022-07-21 ENCOUNTER — Other Ambulatory Visit: Payer: Self-pay

## 2022-07-21 MED ORDER — TRETINOIN (FACIAL WRINKLES) 0.05 % EX CREA: TOPICAL_CREAM | CUTANEOUS | 11 refills | Status: DC

## 2022-07-21 NOTE — Telephone Encounter (Signed)
[facility] doesn't carry the Tretinoin cream..Marland KitchenWill you send it to [facility] [place] please?

## 2022-07-27 ENCOUNTER — Other Ambulatory Visit: Payer: Self-pay | Admitting: Internal Medicine

## 2022-07-27 DIAGNOSIS — Z1231 Encounter for screening mammogram for malignant neoplasm of breast: Secondary | ICD-10-CM

## 2022-08-03 ENCOUNTER — Telehealth: Payer: Self-pay | Admitting: Internal Medicine

## 2022-08-03 NOTE — Telephone Encounter (Signed)
Patient wants to know why the dosage on her Alprazolam and Tretinoin was cut back?

## 2022-08-04 NOTE — Progress Notes (Signed)
Remote pacemaker transmission.   

## 2022-08-12 ENCOUNTER — Encounter: Payer: Medicare PPO | Admitting: Cardiology

## 2022-08-12 ENCOUNTER — Ambulatory Visit (INDEPENDENT_AMBULATORY_CARE_PROVIDER_SITE_OTHER): Payer: Medicare PPO

## 2022-08-12 DIAGNOSIS — I442 Atrioventricular block, complete: Secondary | ICD-10-CM

## 2022-08-12 LAB — CUP PACEART REMOTE DEVICE CHECK
Battery Remaining Longevity: 1 mo
Battery Remaining Percentage: 2 %
Battery Voltage: 2.66 V
Brady Statistic AP VP Percent: 97 %
Brady Statistic AP VS Percent: 1 %
Brady Statistic AS VP Percent: 2.9 %
Brady Statistic AS VS Percent: 1 %
Brady Statistic RA Percent Paced: 97 %
Brady Statistic RV Percent Paced: 99 %
Date Time Interrogation Session: 20231006022126
Implantable Lead Implant Date: 20140805
Implantable Lead Implant Date: 20140805
Implantable Lead Location: 753859
Implantable Lead Location: 753860
Implantable Lead Model: 1944
Implantable Lead Model: 1948
Implantable Pulse Generator Implant Date: 20140805
Lead Channel Impedance Value: 540 Ohm
Lead Channel Impedance Value: 590 Ohm
Lead Channel Pacing Threshold Amplitude: 0.75 V
Lead Channel Pacing Threshold Amplitude: 1.5 V
Lead Channel Pacing Threshold Pulse Width: 0.4 ms
Lead Channel Pacing Threshold Pulse Width: 0.8 ms
Lead Channel Sensing Intrinsic Amplitude: 12 mV
Lead Channel Sensing Intrinsic Amplitude: 4.1 mV
Lead Channel Setting Pacing Amplitude: 1 V
Lead Channel Setting Pacing Amplitude: 3 V
Lead Channel Setting Pacing Pulse Width: 0.4 ms
Lead Channel Setting Sensing Sensitivity: 4 mV
Pulse Gen Model: 2240
Pulse Gen Serial Number: 7523768

## 2022-08-15 ENCOUNTER — Telehealth: Payer: Self-pay | Admitting: Internal Medicine

## 2022-08-15 NOTE — Progress Notes (Deleted)
MEDICARE ANNUAL WELLNESS VISIT AND FOLLOW UP  Assessment:   Diagnoses and all orders for this visit:  Encounter for Medicare annual wellness exam Continue annually   LBBB (left bundle branch block) Followed by cardiology; s/p pacemaker  Hypertrophic obstructive cardiomyopathy (HCC) Weights stable; followed by cardiology; manage BP  Essential hypertension Continue medications Monitor blood pressure at home; call if consistently over 130/80 Continue DASH diet.   Reminder to go to the ER if any CP, SOB, nausea, dizziness, severe HA, changes vision/speech, left arm numbness and tingling and jaw pain.  CHB (complete heart block) (HCC) S/p pacemaker; followed by cardiology  Major depressive disorder, recurrent episode, mild with anxious distress (HCC) Continue medications; fairly controlled; reminded to avoid daily benzo use; she is working on cutting down  Lifestyle discussed: diet/exerise, sleep hygiene, stress management, hydration  Age-related osteoporosis without current pathological fracture - get dexa fall 2020, continue Vit D and Ca, emphasized weight bearing exercises Couldn't tolerate bisphosphonates  Atrophic vaginitis Monitor; topicals PRN  Paroxysmal atrial fibrillation (HCC) No concerns with excessive bleeding, no falls Continue medication: elequis Followed by cardiology  Vitamin D deficiency Below goal last visit; she did increase dose continue to recommend supplementation for goal of 60-100 Check vitamin D level annually or as needed  Other abnormal glucose She is working on weight loss Discussed disease and risks Discussed diet/exercise, weight management  A1C, insulin level routinely   Medication management CBC, CMP at  Mixed hyperlipidemia Mild elevations not on meds; working on lifestyle Continue low cholesterol diet and exercise.  Check lipid panel.   Gastroesophageal reflux disease, esophagitis presence not specified Well  managed by lifestyle at this time Discussed diet, avoiding triggers and other lifestyle changes  Pacemaker Followed by Cardiology  Chronic anticoagulation No signs of excessive bleeding; reminded to present to ED for any trauma or head injuries   Over 40 minutes of exam, counseling, chart review and critical decision making was performed Future Appointments  Date Time Provider Department Center  08/16/2022 11:30 AM Raynelle Dick, NP [facility] None  09/12/2022  7:10 AM [facility] DEVICE REMOTES [facility]  09/16/2022 11:10 AM [facility] MM 2 [facility]  10/13/2022  7:10 AM [facility] DEVICE REMOTES [facility]  11/03/2022  9:00 AM Meriam Sprague, MD [facility]  01/23/2023  2:00 PM Lucky Cowboy, MD [facility] None  04/26/2023 11:00 AM Raynelle Dick, NP [facility] None     Plan:   During the course of the visit the patient was educated and counseled about appropriate screening and preventive services including:   Pneumococcal vaccine  Prevnar 13 Influenza vaccine Td vaccine Screening electrocardiogram Bone densitometry screening Colorectal cancer screening Diabetes screening Glaucoma screening Nutrition counseling  Advanced directives: requested   Subjective:  Alexandra Wheeler is a 81 y.o. female who presents for Medicare Annual Wellness Visit and 3 month follow up.   In 2014, she had a PPM implanted for CHB. Last year she was diagnosed w/Afib and started on Eliquis. She has an echocardiogram that suggests hypertrophic cardiomyopathy with asymmetric septal hypertrophy and mitral valve SAM with mild MR. She had negative cath. Followed by Dr. Lauretta Grill and newly with a. Fib clinic. She denies CP, palpitations, fatigue, exercise intolerance, edema.   She is following with Dr. Regino Schultze for chronic pain, she has headaches related to cervical arthritis, getting injections that were initially helpful but not  recently, pending myelogram post covid 19.   she has a diagnosis of depression/anxiety  and is currently on lexapro 20 mg daily, PRN xanax (0.5-1 mg TID), reports symptoms are well controlled on current regimen. she is currently using xanax 0.5-3/4 tab in the evening. She is trying to avoid taking daily.   BMI is There is no height or weight on file to calculate BMI., she has been working on diet, cutting starches and sweets, exercise limited due to chronic pain.   Wt Readings from Last 3 Encounters:  04/29/22 167 lb 9.6 oz (76 kg)  04/18/22 167 lb (75.8 kg)  01/25/22 163 lb (73.9 kg)    Her blood pressure has been controlled at home, today their BP is   She does not workout. She denies chest pain, shortness of breath, dizziness.   She is not on cholesterol medication and denies myalgias. Her cholesterol is not at goal. The cholesterol last visit was:   Lab Results  Component Value Date   CHOL 189 01/17/2022   HDL 47 (L) 01/17/2022   LDLCALC 114 (H) 01/17/2022   TRIG 161 (H) 01/17/2022   CHOLHDL 4.0 01/17/2022    She has been working on diet and exercise for prediabetes, and denies increased appetite, nausea, paresthesia of the feet, polydipsia, polyuria and visual disturbances. Last A1C in the office was:  Lab Results  Component Value Date   HGBA1C 5.9 (H) 01/17/2022   Last GFR: Lab Results  Component Value Date   GFRNONAA 65 01/08/2021   Patient is on Vitamin D supplement and near goal of 60 at recent check; she has increased up to 1000 IU daily   Lab Results  Component Value Date   VD25OH 65 01/17/2022      Medication Review: Current Outpatient Medications on File Prior to Visit  Medication Sig Dispense Refill   acetaminophen (TYLENOL) 500 MG tablet Take 1,000 mg by mouth in the morning and at bedtime.     Alcohol Swabs (B-D SINGLE USE SWABS REGULAR) PADS Use as directed for Glucose monitoring Daily 100 each 3   ALPRAZolam (XANAX) 0.5 MG tablet Take 1/2 - 1 tablet  at  Bedtime  ONLY if needed for Sleep  &  limit to 5 days /week to avoid Addiction & Dementia 20 tablet 0   Ascorbic Acid (VITAMIN C PO) Take 1 tablet by mouth daily.      Calcium Carbonate-Vitamin D (CALCIUM + D PO) Take 1 tablet by mouth daily.      Cholecalciferol (VITAMIN D PO) Take 5,000 Units by mouth 2 (two) times a day.      citalopram (CELEXA) 20 MG tablet TAKE 1 TABLET EVERY DAY FOR MOOD 90 tablet 3   ezetimibe (ZETIA) 10 MG tablet Take 1 tablet (10 mg total) by mouth daily. 90 tablet 1   FOLIC ACID PO Take 1 tablet by mouth daily.      hydrochlorothiazide (HYDRODIURIL) 25 MG tablet Take 25 mg by mouth daily as needed (swelling).      Hydrocortisone Acetate 1 % OINT Apply to Hemorrhoid 3 to 4  x /day 60 g 3   MAGNESIUM PO Take 500 mg by mouth daily.      metoprolol (TOPROL-XL) 200 MG 24 hr tablet TAKE 1 TABLET EVERY DAY FOR BLOOD PRESSURE AND HEART 90 tablet 3   Multiple Vitamin (MULTIVITAMIN) tablet Take 1 tablet by mouth daily.     Multiple Vitamins-Minerals (ZINC PO) Take 200 mg by mouth daily.     pantoprazole (PROTONIX) 40 MG tablet Take 1 tablet Daily with Supper for Indigestion &  Acid Reflux 90 tablet 3   tretinoin (RETIN-A) 0.025 % cream Apply  Daily  as Directed 45 g 3   No current facility-administered medications on file prior to visit.    Allergies  Allergen Reactions   Levsin [Hyoscyamine Sulfate]     unknown   Mobic [Meloxicam]     unknown    Topamax [Topiramate]     unknown    Zoloft [Sertraline Hcl]     unknown    Actonel [Risedronate Sodium] Other (See Comments)    Pt didn't like the way it made her feel    Boniva [Ibandronic Acid] Other (See Comments)    Pt didn't like the way it made her feel   Evista [Raloxifene] Other (See Comments)    Pt didn't like the way it made her feel   Fosamax [Alendronate Sodium] Other (See Comments)    Pt didn't like the way it made her feel     Current Problems (verified) Patient Active Problem List   Diagnosis Date  Noted   Tenderness of both temporomandibular joints 11/30/2021   Former smoker 01/07/2021   FHx: heart disease 01/07/2021   Thoracic aortic aneurysm ([place]) by Chest CTA 12/2020    HOCM (hypertrophic obstructive cardiomyopathy) ([place])    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   Episodic tension-type headache, not intractable 02/12/2020   Familial tremor 06/10/2019   GERD (gastroesophageal reflux disease) 02/25/2019   Major depressive disorder, recurrent episode, mild with anxious distress ([place]) 02/25/2019   Pacemaker 02/25/2019   Paroxysmal atrial fibrillation ([place]) 02/25/2019   Chronic anticoagulation 02/25/2019   Hypertrophic obstructive cardiomyopathy ([place]) 09/21/2015   Overweight (BMI 25.0-29.9) 09/01/2015   Medication management 08/22/2014   Vitamin D deficiency    Abnormal glucose    Essential hypertension    Hyperlipidemia, mixed    CHB (complete heart block) ([place]) 06/10/2013   LBBB (left bundle branch block) 11/15/2012   Osteoporosis    DI (detrusor instability)    Atrophic vaginitis     Screening Tests Immunization History  Administered Date(s) Administered   Fluad Quad(high Dose 65+) 07/25/2019   Influenza Split 08/06/2014, 09/20/2017   Influenza, High Dose Seasonal PF 09/01/2015, 08/19/2016   Influenza-Unspecified 09/12/2018   Pneumococcal Conjugate-13 09/01/2015   Pneumococcal-Unspecified 10/08/2010   Td 05/09/2011   Zoster, Live 05/09/2011    Preventative care: Last colonoscopy: 11/2013 Last mammogram: 07/2018 Last pap smear/pelvic exam: 10/2013 DEXA: 06/2017 - spine T -2.7, couldn't tolerate bisphosophonates  Prior vaccinations: TD or Tdap: 2012 Influenza: 2019  Pneumococcal: 2011 Prevnar13: 2016  Shingles/Zostavax: 2012  Names of Other Physician/Practitioners you currently use: 1. [facility] here for primary care 2. Dr. Katy Fitch, eye doctor, last visit 2019, had L cataract, R mild 3. Dr. Jone Baseman, dentist, last  visit 2020  Patient Care Team: Unk Pinto, MD as PCP - General (Internal Medicine) Freada Bergeron, MD as PCP - Cardiology (Cardiology) Thompson Grayer, MD as PCP - Electrophysiology (Cardiology) Unk Pinto, MD as Attending Physician (Internal Medicine) Thompson Grayer, MD as Consulting Physician (Cardiology) Heath Lark, MD as Consulting Physician (Hematology and Oncology) Ronald Lobo, MD as Consulting Physician (Gastroenterology) Sharmon Revere as Physician Assistant (Cardiology)  SURGICAL HISTORY She  has a past surgical history that includes Breast surgery; Tubal ligation; Hemorrhoid surgery; Pacemaker insertion (06-11-2013); left heart catheterization with coronary angiogram (N/A, 06/11/2013); temporary pacemaker insertion (N/A, 06/11/2013); permanent pacemaker insertion (Left, 06/11/2013); permanent pacemaker insertion (N/A, 06/11/2013); Cataract extraction (Left, 2019); Breast excisional biopsy (Right); and  Breast biopsy (Left, 07/25/2019). FAMILY HISTORY Her family history includes Aneurysm in her brother; Breast cancer (age of onset: 75) in her sister; Cancer in her father; Diabetes in her brother, brother, and mother; Heart disease in her brother, brother, and mother; Hypertension in her brother; Stroke in her brother. SOCIAL HISTORY She  reports that she quit smoking about 46 years ago. Her smoking use included cigarettes. She has never used smokeless tobacco. She reports that she does not drink alcohol and does not use drugs.   MEDICARE WELLNESS OBJECTIVES: Physical activity:   Cardiac risk factors:   Depression/mood screen:      01/16/2022   11:14 PM  Depression screen PHQ 2/9  Decreased Interest 0  Down, Depressed, Hopeless 0  PHQ - 2 Score 0    ADLs:     01/16/2022   11:15 PM  In your present state of health, do you have any difficulty performing the following activities:  Hearing? 0  Vision? 0  Difficulty concentrating or making decisions? 0   Walking or climbing stairs? 0  Dressing or bathing? 0  Doing errands, shopping? 0     Cognitive Testing  Alert? Yes  Normal Appearance?Yes  Oriented to person? Yes  Place? Yes   Time? Yes  Recall of three objects?  Yes  Can perform simple calculations? Yes  Displays appropriate judgment?Yes  Can read the correct time from a watch face?Yes  EOL planning:    Review of Systems  Constitutional:  Negative for malaise/fatigue and weight loss.  HENT:  Negative for hearing loss and tinnitus.   Eyes:  Negative for blurred vision and double vision.  Respiratory:  Negative for cough, sputum production, shortness of breath and wheezing.   Cardiovascular:  Negative for chest pain, palpitations, orthopnea, claudication, leg swelling and PND.  Gastrointestinal:  Negative for abdominal pain, blood in stool, constipation, diarrhea, heartburn, melena, nausea and vomiting.  Genitourinary: Negative.   Musculoskeletal:  Negative for falls, joint pain and myalgias.  Skin:  Negative for rash.  Neurological:  Negative for dizziness, tingling, sensory change, weakness and headaches.  Endo/Heme/Allergies:  Negative for polydipsia.  Psychiatric/Behavioral:  Negative for depression, memory loss, substance abuse and suicidal ideas. The patient is nervous/anxious and has insomnia.   All other systems reviewed and are negative.    Objective:     There were no vitals filed for this visit. There is no height or weight on file to calculate BMI.    General appearance: alert, no distress, WD/WN,  female HEENT: normocephalic, sclerae anicteric, TMs pearly, nares patent, no discharge or erythema, pharynx normal Neck: + temporal tenderness on the right side. supple, no lymphadenopathy, no thyromegaly, no masses Heart: RRR, normal S1, S2, no murmurs Lungs: CTA bilaterally, no wheezes, rhonchi, or rales Abdomen: +bs, soft, non tender, non distended, no masses, no hepatomegaly, no splenomegaly Musculoskeletal:  normal range of motion, without spinous process tenderness, with paraspinal muscle tenderness the right side, normal sensation, reflexes, and pulses distal. Extremities: no edema, no cyanosis, no clubbing Pulses: 2+ symmetric, upper and lower extremities, normal cap refill Neurological: alert, oriented x 3, CN2-12 intact, strength normal upper extremities and lower extremities, sensation normal throughout, DTRs 2+ throughout, no cerebellar signs, gait normal Psychiatric: normal affect, behavior normal, pleasant   Medicare Attestation I have personally reviewed: The patient's medical and social history Their use of alcohol, tobacco or illicit drugs Their current medications and supplements The patient's functional ability including ADLs,fall risks, home safety risks, cognitive, and hearing and  visual impairment Diet and physical activities Evidence for depression or mood disorders  The patient's weight, height, BMI, and visual acuity have been recorded in the chart.  I have made referrals, counseling, and provided education to the patient based on review of the above and I have provided the patient with a written personalized care plan for preventive services.     Raynelle Dick, NP   08/15/2022

## 2022-08-15 NOTE — Chronic Care Management (AMB) (Signed)
  Chronic Care Management   Note  08/15/2022 Name: Karlin Binion MRN: [id number] DOB: 03-19-1941  Jaynia Fendley is a 81 y.o. year old female who is a primary care patient of Unk Pinto, MD. I reached out to [name] by phone today in response to a referral sent by Ms. Alroy Dust Jodoin's PCP, Unk Pinto, MD.   Ms. Fleet was given information about Chronic Care Management services today including:  CCM service includes personalized support from designated clinical staff supervised by her physician, including individualized plan of care and coordination with other care providers 24/7 contact phone numbers for assistance for urgent and routine care needs. Service will only be billed when office clinical staff spend 20 minutes or more in a month to coordinate care. Only one practitioner may furnish and bill the service in a calendar month. The patient may stop CCM services at any time (effective at the end of the month) by phone call to the office staff.   Patient agreed to services and verbal consent obtained.   Follow up plan:PT [place]

## 2022-08-16 ENCOUNTER — Ambulatory Visit: Payer: Medicare PPO | Admitting: Nurse Practitioner

## 2022-08-16 ENCOUNTER — Other Ambulatory Visit: Payer: Self-pay | Admitting: Internal Medicine

## 2022-08-16 DIAGNOSIS — I421 Obstructive hypertrophic cardiomyopathy: Secondary | ICD-10-CM

## 2022-08-16 DIAGNOSIS — E559 Vitamin D deficiency, unspecified: Secondary | ICD-10-CM

## 2022-08-16 DIAGNOSIS — Z7901 Long term (current) use of anticoagulants: Secondary | ICD-10-CM

## 2022-08-16 DIAGNOSIS — R450 Nervousness: Secondary | ICD-10-CM

## 2022-08-16 DIAGNOSIS — I10 Essential (primary) hypertension: Secondary | ICD-10-CM

## 2022-08-16 DIAGNOSIS — R7309 Other abnormal glucose: Secondary | ICD-10-CM

## 2022-08-16 DIAGNOSIS — Z0001 Encounter for general adult medical examination with abnormal findings: Secondary | ICD-10-CM

## 2022-08-16 DIAGNOSIS — E782 Mixed hyperlipidemia: Secondary | ICD-10-CM

## 2022-08-16 DIAGNOSIS — I712 Thoracic aortic aneurysm, without rupture, unspecified: Secondary | ICD-10-CM

## 2022-08-16 DIAGNOSIS — I480 Paroxysmal atrial fibrillation: Secondary | ICD-10-CM

## 2022-08-16 DIAGNOSIS — K219 Gastro-esophageal reflux disease without esophagitis: Secondary | ICD-10-CM

## 2022-08-16 DIAGNOSIS — M810 Age-related osteoporosis without current pathological fracture: Secondary | ICD-10-CM

## 2022-08-16 DIAGNOSIS — Z79899 Other long term (current) drug therapy: Secondary | ICD-10-CM

## 2022-08-16 DIAGNOSIS — Z950 Presence of cardiac pacemaker: Secondary | ICD-10-CM

## 2022-08-16 DIAGNOSIS — I447 Left bundle-branch block, unspecified: Secondary | ICD-10-CM

## 2022-08-16 MED ORDER — ALPRAZOLAM 0.5 MG PO TABS: ORAL_TABLET | ORAL | 0 refills | Status: DC

## 2022-08-16 NOTE — Progress Notes (Signed)
Remote pacemaker transmission.   

## 2022-08-19 ENCOUNTER — Encounter: Payer: Self-pay | Admitting: Cardiology

## 2022-08-19 ENCOUNTER — Ambulatory Visit: Payer: Medicare PPO | Attending: Cardiology | Admitting: Cardiology

## 2022-08-19 VITALS — BP 120/78 | HR 62 | Ht 65.0 in | Wt 169.0 lb

## 2022-08-19 DIAGNOSIS — I442 Atrioventricular block, complete: Secondary | ICD-10-CM | POA: Diagnosis not present

## 2022-08-19 LAB — CUP PACEART INCLINIC DEVICE CHECK
Battery Remaining Longevity: 6 mo
Brady Statistic RA Percent Paced: 97 %
Brady Statistic RV Percent Paced: 99 %
Date Time Interrogation Session: 20231013173020
Implantable Lead Implant Date: 20140805
Implantable Lead Implant Date: 20140805
Implantable Lead Location: 753859
Implantable Lead Location: 753860
Implantable Lead Model: 1944
Implantable Lead Model: 1948
Implantable Pulse Generator Implant Date: 20140805
Lead Channel Pacing Threshold Amplitude: 1 V
Lead Channel Pacing Threshold Amplitude: 1.5 V
Lead Channel Pacing Threshold Pulse Width: 0.4 ms
Lead Channel Pacing Threshold Pulse Width: 0.8 ms
Lead Channel Sensing Intrinsic Amplitude: 2.8 mV
Pulse Gen Model: 2240
Pulse Gen Serial Number: 7523768

## 2022-08-19 NOTE — Progress Notes (Signed)
Electrophysiology Office Note   Date:  08/19/2022   ID:  Sinthia, Karabin January 15, 1941, MRN [id number]  PCP:  Lucky Cowboy, MD  Cardiologist:  Shari Prows Primary Electrophysiologist:  Toula Miyasaki Jorja Loa, MD    Chief Complaint: AF   History of Present Illness: Annalysse Shoemaker is a 81 y.o. female who is being seen today for the evaluation of AF at the request of Lucky Cowboy, MD. Presenting today for electrophysiology evaluation.  She has a history significant for atrial fibrillation, complete heart block, hypertension, hyperlipidemia, hypertrophic cardiomyopathy.  She is status post [employer] Jude dual-chamber pacemaker implanted 06/11/2013.  Today, she denies symptoms of palpitations, chest pain, shortness of breath, orthopnea, PND, lower extremity edema, claudication, dizziness, presyncope, syncope, bleeding, or neurologic sequela. The patient is tolerating medications without difficulties.    Past Medical History:  Diagnosis Date   Anxiety    Atrophic vaginitis    Chest pain    a. Adenosine sestamibi (1/14) with no evidence of ischemia or infarction, EF 63%.    Colon polyps    Complete heart block (HCC) 06-2013   status post pacemaker implantation by Dr Johney Frame 06-11-2013   Depression    DI (detrusor instability)    GERD (gastroesophageal reflux disease)    History + Hyplori via EGD   H/O echocardiogram    a. Echo 11/2012:EF 50-55%, focal basal septal hypertrophy with some mitral valve SAM (mild) and MR.  This may be a sigmoid septum with advanced age or could be a variant of hypertrophic cardiomyopathy.   HOCM (hypertrophic obstructive cardiomyopathy) (HCC)    Echocardiogram 2/22: EF 60-65, no RWMA, severe asymmetric septal LVH, no LVOT, Gr 1 DD, mild SAM, mod MR, mild LAE, mod to severe TR, Asc Ao 42 mm, normal RVSF, RVSP 28   HTN (hypertension)    Hyperlipidemia    LBBB (left bundle branch block)    Leukopenia 05/19/2014   Lymphocytosis 05/19/2014    Migraines    Myoview    Myoview 12/21: EF 69, no ischemia or infarction; low risk    Osteoporosis    Paroxysmal atrial fibrillation (HCC)    Prediabetes    Thoracic aortic aneurysm (HCC)    Chest CTA 2/22: Ascending thoracic aortic aneurysm 44 mm; Ao arch 34 mm>>repeat 1 year   Vitamin D deficiency    Past Surgical History:  Procedure Laterality Date   BREAST BIOPSY Left 07/25/2019   ANGIOLIPOMA   BREAST EXCISIONAL BIOPSY Right    BREAST SURGERY     Breast cyst   CATARACT EXTRACTION Left 2019   Dr. Dione Booze   HEMORRHOID SURGERY     LEFT HEART CATHETERIZATION WITH CORONARY ANGIOGRAM N/A 06/11/2013   Procedure: LEFT HEART CATHETERIZATION WITH CORONARY ANGIOGRAM;  Surgeon: Kathleene Hazel, MD;  Location: [facility] CATH LAB;  Service: Cardiovascular;  Laterality: N/A;   PACEMAKER INSERTION  06-11-2013   STJ dual chamber pacemaker implanted by Dr Johney Frame for complete heart block   PERMANENT PACEMAKER INSERTION Left 06/11/2013   Procedure: PERMANENT PACEMAKER INSERTION;  Surgeon: Kathleene Hazel, MD;  Location: [facility] CATH LAB;  Service: Cardiovascular;  Laterality: Left;   PERMANENT PACEMAKER INSERTION N/A 06/11/2013   Procedure: PERMANENT PACEMAKER INSERTION;  Surgeon: Hillis Range, MD;  Location: [facility] CATH LAB;  Service: Cardiovascular;  Laterality: N/A;   TEMPORARY PACEMAKER INSERTION N/A 06/11/2013   Procedure: TEMPORARY PACEMAKER INSERTION;  Surgeon: Kathleene Hazel, MD;  Location: [facility] CATH LAB;  Service: Cardiovascular;  Laterality: N/A;   TUBAL LIGATION  Current Outpatient Medications  Medication Sig Dispense Refill   acetaminophen (TYLENOL) 500 MG tablet Take 1,000 mg by mouth in the morning and at bedtime.     Alcohol Swabs (B-D SINGLE USE SWABS REGULAR) PADS Use as directed for Glucose monitoring Daily 100 each 3   ALPRAZolam (XANAX) 0.5 MG tablet Take  1/2 - 1 tablet  Daily  ONLY  if needed for  Sleep or Tremor  &  limit to 5 days /week to avoid Addiction & Dementia 60  tablet 0   Ascorbic Acid (VITAMIN C PO) Take 1 tablet by mouth daily.      Calcium Carbonate-Vitamin D (CALCIUM + D PO) Take 1 tablet by mouth daily.      Cholecalciferol (VITAMIN D PO) Take 5,000 Units by mouth 2 (two) times a day.      citalopram (CELEXA) 20 MG tablet TAKE 1 TABLET EVERY DAY FOR MOOD 90 tablet 3   ezetimibe (ZETIA) 10 MG tablet Take 1 tablet (10 mg total) by mouth daily. 90 tablet 1   FOLIC ACID PO Take 1 tablet by mouth daily.      hydrochlorothiazide (HYDRODIURIL) 25 MG tablet Take 25 mg by mouth daily as needed (swelling).      Hydrocortisone Acetate 1 % OINT Apply to Hemorrhoid 3 to 4  x /day 60 g 3   MAGNESIUM PO Take 500 mg by mouth daily.      metoprolol (TOPROL-XL) 200 MG 24 hr tablet TAKE 1 TABLET EVERY DAY FOR BLOOD PRESSURE AND HEART 90 tablet 3   Multiple Vitamin (MULTIVITAMIN) tablet Take 1 tablet by mouth daily.     Multiple Vitamins-Minerals (ZINC PO) Take 200 mg by mouth daily.     pantoprazole (PROTONIX) 40 MG tablet Take 1 tablet Daily with Supper for Indigestion & Acid Reflux 90 tablet 3   tretinoin (RETIN-A) 0.025 % cream Apply  Daily  as Directed 45 g 3   No current facility-administered medications for this visit.    Allergies:   Levsin [hyoscyamine sulfate], Mobic [meloxicam], Topamax [topiramate], Zoloft [sertraline hcl], Actonel [risedronate sodium], Boniva [ibandronic acid], Evista [raloxifene], and Fosamax [alendronate sodium]   Social History:  The patient  reports that she quit smoking about 46 years ago. Her smoking use included cigarettes. She has never used smokeless tobacco. She reports that she does not drink alcohol and does not use drugs.   Family History:  The patient's family history includes Aneurysm in her brother; Breast cancer (age of onset: 48) in her sister; Cancer in her father; Diabetes in her brother, brother, and mother; Heart disease in her brother, brother, and mother; Hypertension in her brother; Stroke in her brother.     ROS:  Please see the history of present illness.   Otherwise, review of systems is positive for none.   All other systems are reviewed and negative.    PHYSICAL EXAM: VS:  BP 120/78   Pulse 62   Ht 5\' 5"  (1.651 m)   Wt 169 lb (76.7 kg)   SpO2 98%   BMI 28.12 kg/m  , BMI Body mass index is 28.12 kg/m. GEN: Well nourished, well developed, in no acute distress  HEENT: normal  Neck: no JVD, carotid bruits, or masses Cardiac: RRR; no murmurs, rubs, or gallops,no edema  Respiratory:  clear to auscultation bilaterally, normal work of breathing GI: soft, nontender, nondistended, + BS MS: no deformity or atrophy  Skin: warm and dry, device pocket is well healed Neuro:  Strength and  sensation are intact Psych: euthymic mood, full affect  EKG:  EKG is not ordered today. Personal review of the ekg ordered 01/17/22 shows AV paced  Device interrogation is reviewed today in detail.  See PaceArt for details.   Recent Labs: 01/17/2022: ALT 19; BUN 13; Creat 0.68; Hemoglobin 13.1; Magnesium 2.4; Platelets 201; Potassium 4.8; Sodium 142; TSH 0.65    Lipid Panel     Component Value Date/Time   CHOL 189 01/17/2022 1523   TRIG 161 (H) 01/17/2022 1523   HDL 47 (L) 01/17/2022 1523   CHOLHDL 4.0 01/17/2022 1523   VLDL 20 04/11/2017 1025   LDLCALC 114 (H) 01/17/2022 1523     Wt Readings from Last 3 Encounters:  08/19/22 169 lb (76.7 kg)  04/29/22 167 lb 9.6 oz (76 kg)  04/18/22 167 lb (75.8 kg)      Other studies Reviewed: Additional studies/ records that were reviewed today include: TTE 05/19/22  Review of the above records today demonstrates:   1. Left ventricular ejection fraction, by estimation, is 60 to 65%. The  left ventricle has normal function. The left ventricle has no regional  wall motion abnormalities. There is moderate asymmetric left ventricular  hypertrophy of the basal-septal  segment. Left ventricular diastolic parameters are consistent with Grade I  diastolic  dysfunction (impaired relaxation). The average left ventricular  global longitudinal strain is -17.2 % (borderline)   2. Right ventricular systolic function is normal. The right ventricular  size is not well visualized. There is mildly elevated pulmonary artery  systolic pressure.   3. The mitral valve is grossly normal. Mild mitral valve regurgitation.   4. The aortic valve is tricuspid. Aortic valve regurgitation is trivial.  No aortic stenosis is present.   5. Aortic ascending aortic aneurysm (45 mm).   6. Left atrial size was mildly dilated.   7. The inferior vena cava is normal in size with greater than 50%  respiratory variability, suggesting right atrial pressure of 3 mmHg.    ASSESSMENT AND PLAN:  1.  Complete heart block: Status post [facility] Jude dual-chamber pacemaker.  Device functioning appropriately.  No changes at this time.  Nearing ERI.  Risk and benefits of generator change of been discussed.  Risk of bleeding and infection.  She understands these risks and is agreed to the procedure.  2.  Paroxysmal atrial fibrillation: CHA2DS2-VASc of 4.  Less than 1% burden on pacemaker interrogation.  Patient prefers to avoid anticoagulation.  3.  Hypertension: well controlled  4.  Hypertrophic cardiomyopathy: No evidence of obstruction on most recent echo.  Plan per primary cardiology.  Current medicines are reviewed at length with the patient today.   The patient does not have concerns regarding her medicines.  The following changes were made today:  none  Labs/ tests ordered today include:  No orders of the defined types were placed in this encounter.    Disposition:   FU 6 months  Signed, Aracelli Woloszyn Meredith Leeds, MD  08/19/2022 11:41 AM     [facility] [address] [place] [address] [phone] (office) [phone] (fax)

## 2022-08-19 NOTE — Patient Instructions (Signed)
Medication Instructions:  None  *If you need a refill on your cardiac medications before your next appointment, please call your pharmacy*   Lab Work: none If you have labs (blood work) drawn today and your tests are completely normal, you will receive your results only by: [facility] (if you have MyChart) OR A paper copy in the mail If you have any lab test that is abnormal or we need to change your treatment, we will call you to review the results.   Testing/Procedures: none   Follow-Up: At [facility], you and your health needs are our priority.  As part of our continuing mission to provide you with exceptional heart care, we have created designated Provider Care Teams.  These Care Teams include your primary Cardiologist (physician) and Advanced Practice Providers (APPs -  Physician Assistants and Nurse Practitioners) who all work together to provide you with the care you need, when you need it.  We recommend signing up for the patient portal called "MyChart".  Sign up information is provided on this After Visit Summary.  MyChart is used to connect with patients for Virtual Visits (Telemedicine).  Patients are able to view lab/test results, encounter notes, upcoming appointments, etc.  Non-urgent messages can be sent to your provider as well.   To learn more about what you can do with MyChart, go to [email or web address].    Your next appointment:   6 month(s)  The format for your next appointment:   In Person  Provider:   You will see one of the following Advanced Practice Providers on your designated Care Team:   Tommye Standard, [place]       Other Instructions None   Important Information About Sugar

## 2022-08-24 DIAGNOSIS — M5451 Vertebrogenic low back pain: Secondary | ICD-10-CM | POA: Diagnosis not present

## 2022-08-26 NOTE — Progress Notes (Unsigned)
MEDICARE ANNUAL WELLNESS VISIT AND FOLLOW UP  Assessment:   Diagnoses and all orders for this visit:  Encounter for Medicare annual wellness exam Continue annually   LBBB (left bundle branch block) Followed by cardiology; s/p pacemaker  Hypertrophic obstructive cardiomyopathy (HCC) Weights stable; followed by cardiology; manage BP  Essential hypertension Continue medications Monitor blood pressure at home; call if consistently over 130/80 Continue DASH diet.   Reminder to go to the ER if any CP, SOB, nausea, dizziness, severe HA, changes vision/speech, left arm numbness and tingling and jaw pain.  CHB (complete heart block) (HCC) S/p pacemaker; followed by cardiology  Major depressive disorder, recurrent episode, mild with anxious distress (HCC) Continue medications; fairly controlled; reminded to avoid daily benzo use; she is working on cutting down  Lifestyle discussed: diet/exerise, sleep hygiene, stress management, hydration  Age-related osteoporosis without current pathological fracture - get dexa fall 2020, continue Vit D and Ca, emphasized weight bearing exercises Couldn't tolerate bisphosphonates  Atrophic vaginitis Monitor; topicals PRN  Paroxysmal atrial fibrillation (HCC) No concerns with excessive bleeding, no falls Continue medication: elequis Followed by cardiology  Vitamin D deficiency Below goal last visit; she did increase dose continue to recommend supplementation for goal of 60-100 Check vitamin D level annually or as needed  Other abnormal glucose She is working on weight loss Discussed disease and risks Discussed diet/exercise, weight management  A1C, insulin level routinely   Medication management CBC, CMP at  Mixed hyperlipidemia Mild elevations not on meds; working on lifestyle Continue low cholesterol diet and exercise.  Check lipid panel.   Gastroesophageal reflux disease, esophagitis presence not specified Well  managed by lifestyle at this time Discussed diet, avoiding triggers and other lifestyle changes  Pacemaker Followed by Cardiology  Chronic anticoagulation No signs of excessive bleeding; reminded to present to ED for any trauma or head injuries   Over 40 minutes of exam, counseling, chart review and critical decision making was performed Future Appointments  Date Time Provider Department Center  08/29/2022  3:30 PM Raynelle Dick, NP [facility] None  09/12/2022  7:10 AM [facility] DEVICE REMOTES [facility]  09/16/2022 11:10 AM [facility] MM 2 [facility]  09/27/2022 10:30 AM Sundra Aland, RPH [facility] None  10/13/2022  7:10 AM [facility] DEVICE REMOTES [facility]  11/03/2022  9:00 AM Meriam Sprague, MD [facility]  01/23/2023  2:00 PM Lucky Cowboy, MD [facility] None  04/26/2023 11:00 AM Raynelle Dick, NP [facility] None     Plan:   During the course of the visit the patient was educated and counseled about appropriate screening and preventive services including:   Pneumococcal vaccine  Prevnar 13 Influenza vaccine Td vaccine Screening electrocardiogram Bone densitometry screening Colorectal cancer screening Diabetes screening Glaucoma screening Nutrition counseling  Advanced directives: requested   Subjective:  Alexandra Wheeler is a 81 y.o. female who presents for Medicare Annual Wellness Visit and 3 month follow up.   In 2014, she had a PPM implanted for CHB. Last year she was diagnosed w/Afib and started on Eliquis. She has an echocardiogram that suggests hypertrophic cardiomyopathy with asymmetric septal hypertrophy and mitral valve SAM with mild MR. She had negative cath. Followed by Dr. Lauretta Grill and newly with a. Fib clinic. She denies CP, palpitations, fatigue, exercise intolerance, edema.   She is following with Dr. Regino Schultze for chronic pain, she has headaches related to cervical  arthritis, getting injections that were initially helpful but not recently, pending myelogram  post covid 19.   she has a diagnosis of depression/anxiety and is currently on lexapro 20 mg daily, PRN xanax (0.5-1 mg TID), reports symptoms are well controlled on current regimen. she is currently using xanax 0.5-3/4 tab in the evening. She is trying to avoid taking daily.   BMI is There is no height or weight on file to calculate BMI., she has been working on diet, cutting starches and sweets, exercise limited due to chronic pain.   Wt Readings from Last 3 Encounters:  08/19/22 169 lb (76.7 kg)  04/29/22 167 lb 9.6 oz (76 kg)  04/18/22 167 lb (75.8 kg)    Her blood pressure has been controlled at home, today their BP is   She does not workout. She denies chest pain, shortness of breath, dizziness.   She is not on cholesterol medication and denies myalgias. Her cholesterol is not at goal. The cholesterol last visit was:   Lab Results  Component Value Date   CHOL 189 01/17/2022   HDL 47 (L) 01/17/2022   LDLCALC 114 (H) 01/17/2022   TRIG 161 (H) 01/17/2022   CHOLHDL 4.0 01/17/2022    She has been working on diet and exercise for prediabetes, and denies increased appetite, nausea, paresthesia of the feet, polydipsia, polyuria and visual disturbances. Last A1C in the office was:  Lab Results  Component Value Date   HGBA1C 5.9 (H) 01/17/2022   Last GFR: Lab Results  Component Value Date   GFRNONAA 65 01/08/2021   Patient is on Vitamin D supplement and near goal of 60 at recent check; she has increased up to 1000 IU daily   Lab Results  Component Value Date   VD25OH 65 01/17/2022      Medication Review: Current Outpatient Medications on File Prior to Visit  Medication Sig Dispense Refill   acetaminophen (TYLENOL) 500 MG tablet Take 1,000 mg by mouth in the morning and at bedtime.     Alcohol Swabs (B-D SINGLE USE SWABS REGULAR) PADS Use as directed for Glucose monitoring Daily 100 each  3   ALPRAZolam (XANAX) 0.5 MG tablet Take  1/2 - 1 tablet  Daily  ONLY  if needed for  Sleep or Tremor  &  limit to 5 days /week to avoid Addiction & Dementia 60 tablet 0   Ascorbic Acid (VITAMIN C PO) Take 1 tablet by mouth daily.      Calcium Carbonate-Vitamin D (CALCIUM + D PO) Take 1 tablet by mouth daily.      Cholecalciferol (VITAMIN D PO) Take 5,000 Units by mouth 2 (two) times a day.      citalopram (CELEXA) 20 MG tablet TAKE 1 TABLET EVERY DAY FOR MOOD 90 tablet 3   ezetimibe (ZETIA) 10 MG tablet Take 1 tablet (10 mg total) by mouth daily. 90 tablet 1   FOLIC ACID PO Take 1 tablet by mouth daily.      hydrochlorothiazide (HYDRODIURIL) 25 MG tablet Take 25 mg by mouth daily as needed (swelling).      Hydrocortisone Acetate 1 % OINT Apply to Hemorrhoid 3 to 4  x /day 60 g 3   MAGNESIUM PO Take 500 mg by mouth daily.      metoprolol (TOPROL-XL) 200 MG 24 hr tablet TAKE 1 TABLET EVERY DAY FOR BLOOD PRESSURE AND HEART 90 tablet 3   Multiple Vitamin (MULTIVITAMIN) tablet Take 1 tablet by mouth daily.     Multiple Vitamins-Minerals (ZINC PO) Take 200 mg by mouth daily.  pantoprazole (PROTONIX) 40 MG tablet Take 1 tablet Daily with Supper for Indigestion & Acid Reflux 90 tablet 3   tretinoin (RETIN-A) 0.025 % cream Apply  Daily  as Directed 45 g 3   No current facility-administered medications on file prior to visit.    Allergies  Allergen Reactions   Levsin [Hyoscyamine Sulfate]     unknown   Mobic [Meloxicam]     unknown    Topamax [Topiramate]     unknown    Zoloft [Sertraline Hcl]     unknown    Actonel [Risedronate Sodium] Other (See Comments)    Pt didn't like the way it made her feel    Boniva [Ibandronic Acid] Other (See Comments)    Pt didn't like the way it made her feel   Evista [Raloxifene] Other (See Comments)    Pt didn't like the way it made her feel   Fosamax [Alendronate Sodium] Other (See Comments)    Pt didn't like the way it made her feel      Current Problems (verified) Patient Active Problem List   Diagnosis Date Noted   Tenderness of both temporomandibular joints 11/30/2021   Former smoker 01/07/2021   FHx: heart disease 01/07/2021   Thoracic aortic aneurysm ([place]) by Chest CTA 12/2020    HOCM (hypertrophic obstructive cardiomyopathy) ([place])    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   Episodic tension-type headache, not intractable 02/12/2020   Familial tremor 06/10/2019   GERD (gastroesophageal reflux disease) 02/25/2019   Major depressive disorder, recurrent episode, mild with anxious distress ([place]) 02/25/2019   Pacemaker 02/25/2019   Paroxysmal atrial fibrillation ([place]) 02/25/2019   Chronic anticoagulation 02/25/2019   Hypertrophic obstructive cardiomyopathy ([place]) 09/21/2015   Overweight (BMI 25.0-29.9) 09/01/2015   Medication management 08/22/2014   Vitamin D deficiency    Abnormal glucose    Essential hypertension    Hyperlipidemia, mixed    CHB (complete heart block) ([place]) 06/10/2013   LBBB (left bundle branch block) 11/15/2012   Osteoporosis    DI (detrusor instability)    Atrophic vaginitis     Screening Tests Immunization History  Administered Date(s) Administered   Fluad Quad(high Dose 65+) 07/25/2019   Influenza Split 08/06/2014, 09/20/2017   Influenza, High Dose Seasonal PF 09/01/2015, 08/19/2016   Influenza-Unspecified 09/12/2018   Pneumococcal Conjugate-13 09/01/2015   Pneumococcal-Unspecified 10/08/2010   Td 05/09/2011   Zoster, Live 05/09/2011    Preventative care: Last colonoscopy: 11/2013 Last mammogram: 07/2018 Last pap smear/pelvic exam: 10/2013 DEXA: 06/2017 - spine T -2.7, couldn't tolerate bisphosophonates  Prior vaccinations: TD or Tdap: 2012 Influenza: 2019  Pneumococcal: 2011 Prevnar13: 2016  Shingles/Zostavax: 2012  Names of Other Physician/Practitioners you currently use: 1. [facility] here for primary care 2. Dr. Katy Fitch,  eye doctor, last visit 2019, had L cataract, R mild 3. Dr. Jone Baseman, dentist, last visit 2020  Patient Care Team: Unk Pinto, MD as PCP - General (Internal Medicine) Freada Bergeron, MD as PCP - Cardiology (Cardiology) Thompson Grayer, MD as PCP - Electrophysiology (Cardiology) Unk Pinto, MD as Attending Physician (Internal Medicine) Thompson Grayer, MD as Consulting Physician (Cardiology) Heath Lark, MD as Consulting Physician (Hematology and Oncology) Ronald Lobo, MD as Consulting Physician (Gastroenterology) Sharmon Revere as Physician Assistant (Cardiology) Carleene Mains, [facility] as Pharmacist (Pharmacist)  SURGICAL HISTORY She  has a past surgical history that includes Breast surgery; Tubal ligation; Hemorrhoid surgery; Pacemaker insertion (06-11-2013); left heart catheterization with coronary angiogram (N/A, 06/11/2013); temporary pacemaker insertion (  N/A, 06/11/2013); permanent pacemaker insertion (Left, 06/11/2013); permanent pacemaker insertion (N/A, 06/11/2013); Cataract extraction (Left, 2019); Breast excisional biopsy (Right); and Breast biopsy (Left, 07/25/2019). FAMILY HISTORY Her family history includes Aneurysm in her brother; Breast cancer (age of onset: 7467) in her sister; Cancer in her father; Diabetes in her brother, brother, and mother; Heart disease in her brother, brother, and mother; Hypertension in her brother; Stroke in her brother. SOCIAL HISTORY She  reports that she quit smoking about 46 years ago. Her smoking use included cigarettes. She has never used smokeless tobacco. She reports that she does not drink alcohol and does not use drugs.   MEDICARE WELLNESS OBJECTIVES: Physical activity:   Cardiac risk factors:   Depression/mood screen:      01/16/2022   11:14 PM  Depression screen PHQ 2/9  Decreased Interest 0  Down, Depressed, Hopeless 0  PHQ - 2 Score 0    ADLs:     01/16/2022   11:15 PM  In your present state of health, do you have any  difficulty performing the following activities:  Hearing? 0  Vision? 0  Difficulty concentrating or making decisions? 0  Walking or climbing stairs? 0  Dressing or bathing? 0  Doing errands, shopping? 0     Cognitive Testing  Alert? Yes  Normal Appearance?Yes  Oriented to person? Yes  Place? Yes   Time? Yes  Recall of three objects?  Yes  Can perform simple calculations? Yes  Displays appropriate judgment?Yes  Can read the correct time from a watch face?Yes  EOL planning:    Review of Systems  Constitutional:  Negative for malaise/fatigue and weight loss.  HENT:  Negative for hearing loss and tinnitus.   Eyes:  Negative for blurred vision and double vision.  Respiratory:  Negative for cough, sputum production, shortness of breath and wheezing.   Cardiovascular:  Negative for chest pain, palpitations, orthopnea, claudication, leg swelling and PND.  Gastrointestinal:  Negative for abdominal pain, blood in stool, constipation, diarrhea, heartburn, melena, nausea and vomiting.  Genitourinary: Negative.   Musculoskeletal:  Negative for falls, joint pain and myalgias.  Skin:  Negative for rash.  Neurological:  Negative for dizziness, tingling, sensory change, weakness and headaches.  Endo/Heme/Allergies:  Negative for polydipsia.  Psychiatric/Behavioral:  Negative for depression, memory loss, substance abuse and suicidal ideas. The patient is nervous/anxious and has insomnia.   All other systems reviewed and are negative.    Objective:     There were no vitals filed for this visit. There is no height or weight on file to calculate BMI.    General appearance: alert, no distress, WD/WN,  female HEENT: normocephalic, sclerae anicteric, TMs pearly, nares patent, no discharge or erythema, pharynx normal Neck: + temporal tenderness on the right side. supple, no lymphadenopathy, no thyromegaly, no masses Heart: RRR, normal S1, S2, no murmurs Lungs: CTA bilaterally, no wheezes,  rhonchi, or rales Abdomen: +bs, soft, non tender, non distended, no masses, no hepatomegaly, no splenomegaly Musculoskeletal: normal range of motion, without spinous process tenderness, with paraspinal muscle tenderness the right side, normal sensation, reflexes, and pulses distal. Extremities: no edema, no cyanosis, no clubbing Pulses: 2+ symmetric, upper and lower extremities, normal cap refill Neurological: alert, oriented x 3, CN2-12 intact, strength normal upper extremities and lower extremities, sensation normal throughout, DTRs 2+ throughout, no cerebellar signs, gait normal Psychiatric: normal affect, behavior normal, pleasant   Medicare Attestation I have personally reviewed: The patient's medical and social history Their use of alcohol, tobacco or  illicit drugs Their current medications and supplements The patient's functional ability including ADLs,fall risks, home safety risks, cognitive, and hearing and visual impairment Diet and physical activities Evidence for depression or mood disorders  The patient's weight, height, BMI, and visual acuity have been recorded in the chart.  I have made referrals, counseling, and provided education to the patient based on review of the above and I have provided the patient with a written personalized care plan for preventive services.     Raynelle Dick, NP   08/26/2022

## 2022-08-29 ENCOUNTER — Encounter: Payer: Self-pay | Admitting: Nurse Practitioner

## 2022-08-29 ENCOUNTER — Other Ambulatory Visit: Payer: Self-pay | Admitting: Nurse Practitioner

## 2022-08-29 ENCOUNTER — Ambulatory Visit (INDEPENDENT_AMBULATORY_CARE_PROVIDER_SITE_OTHER): Payer: Medicare PPO | Admitting: Nurse Practitioner

## 2022-08-29 VITALS — BP 112/84 | HR 74 | Temp 97.7°F | Ht 65.0 in | Wt 167.4 lb

## 2022-08-29 DIAGNOSIS — I712 Thoracic aortic aneurysm, without rupture, unspecified: Secondary | ICD-10-CM

## 2022-08-29 DIAGNOSIS — M816 Localized osteoporosis [Lequesne]: Secondary | ICD-10-CM | POA: Diagnosis not present

## 2022-08-29 DIAGNOSIS — F330 Major depressive disorder, recurrent, mild: Secondary | ICD-10-CM

## 2022-08-29 DIAGNOSIS — Z79899 Other long term (current) drug therapy: Secondary | ICD-10-CM

## 2022-08-29 DIAGNOSIS — Z0001 Encounter for general adult medical examination with abnormal findings: Secondary | ICD-10-CM | POA: Diagnosis not present

## 2022-08-29 DIAGNOSIS — K219 Gastro-esophageal reflux disease without esophagitis: Secondary | ICD-10-CM

## 2022-08-29 DIAGNOSIS — R6889 Other general symptoms and signs: Secondary | ICD-10-CM | POA: Diagnosis not present

## 2022-08-29 DIAGNOSIS — Z0000 Encounter for general adult medical examination without abnormal findings: Secondary | ICD-10-CM

## 2022-08-29 DIAGNOSIS — Z7901 Long term (current) use of anticoagulants: Secondary | ICD-10-CM

## 2022-08-29 DIAGNOSIS — I442 Atrioventricular block, complete: Secondary | ICD-10-CM

## 2022-08-29 DIAGNOSIS — I480 Paroxysmal atrial fibrillation: Secondary | ICD-10-CM

## 2022-08-29 DIAGNOSIS — I421 Obstructive hypertrophic cardiomyopathy: Secondary | ICD-10-CM | POA: Diagnosis not present

## 2022-08-29 DIAGNOSIS — E782 Mixed hyperlipidemia: Secondary | ICD-10-CM

## 2022-08-29 DIAGNOSIS — Z950 Presence of cardiac pacemaker: Secondary | ICD-10-CM

## 2022-08-29 DIAGNOSIS — R7309 Other abnormal glucose: Secondary | ICD-10-CM

## 2022-08-29 DIAGNOSIS — I447 Left bundle-branch block, unspecified: Secondary | ICD-10-CM

## 2022-08-29 DIAGNOSIS — Z23 Encounter for immunization: Secondary | ICD-10-CM

## 2022-08-29 DIAGNOSIS — I10 Essential (primary) hypertension: Secondary | ICD-10-CM | POA: Diagnosis not present

## 2022-08-29 DIAGNOSIS — E559 Vitamin D deficiency, unspecified: Secondary | ICD-10-CM

## 2022-08-29 MED ORDER — TRETINOIN (EMOLLIENT) 0.05 % EX CREA: TOPICAL_CREAM | CUTANEOUS | 1 refills | Status: DC

## 2022-08-29 NOTE — Patient Instructions (Signed)

## 2022-08-30 LAB — COMPLETE METABOLIC PANEL WITH GFR
AG Ratio: 1.6 (calc) (ref 1.0–2.5)
ALT: 12 U/L (ref 6–29)
AST: 15 U/L (ref 10–35)
Albumin: 4.3 g/dL (ref 3.6–5.1)
Alkaline phosphatase (APISO): 92 U/L (ref 37–153)
BUN: 14 mg/dL (ref 7–25)
CO2: 28 mmol/L (ref 20–32)
Calcium: 10.1 mg/dL (ref 8.6–10.4)
Chloride: 103 mmol/L (ref 98–110)
Creat: 0.75 mg/dL (ref 0.60–0.95)
Globulin: 2.7 g/dL (calc) (ref 1.9–3.7)
Glucose, Bld: 91 mg/dL (ref 65–99)
Potassium: 4.4 mmol/L (ref 3.5–5.3)
Sodium: 140 mmol/L (ref 135–146)
Total Bilirubin: 0.4 mg/dL (ref 0.2–1.2)
Total Protein: 7 g/dL (ref 6.1–8.1)
eGFR: 80 mL/min/{1.73_m2} (ref >=60)

## 2022-08-30 LAB — CBC WITH DIFFERENTIAL/PLATELET
Absolute Monocytes: 377 cells/uL (ref 200–950)
Basophils Absolute: 31 cells/uL (ref 0–200)
Basophils Relative: 0.6 %
Eosinophils Absolute: 82 cells/uL (ref 15–500)
Eosinophils Relative: 1.6 %
HCT: 42 % (ref 35.0–45.0)
Hemoglobin: 13.3 g/dL (ref 11.7–15.5)
Lymphs Abs: 2463 cells/uL (ref 850–3900)
MCH: 28 pg (ref 27.0–33.0)
MCHC: 31.7 g/dL — ABNORMAL LOW (ref 32.0–36.0)
MCV: 88.4 fL (ref 80.0–100.0)
MPV: 11.4 fL (ref 7.5–12.5)
Monocytes Relative: 7.4 %
Neutro Abs: 2147 cells/uL (ref 1500–7800)
Neutrophils Relative %: 42.1 %
Platelets: 201 10*3/uL (ref 140–400)
RBC: 4.75 10*6/uL (ref 3.80–5.10)
RDW: 12.6 % (ref 11.0–15.0)
Total Lymphocyte: 48.3 %
WBC: 5.1 10*3/uL (ref 3.8–10.8)

## 2022-08-30 LAB — LIPID PANEL
Cholesterol: 195 mg/dL (ref <200)
HDL: 55 mg/dL (ref >=50)
LDL Cholesterol (Calc): 117 mg/dL (calc) — ABNORMAL HIGH
Non-HDL Cholesterol (Calc): 140 mg/dL (calc) — ABNORMAL HIGH (ref <130)
Total CHOL/HDL Ratio: 3.5 (calc) (ref <5.0)
Triglycerides: 121 mg/dL (ref <150)

## 2022-08-30 LAB — HEMOGLOBIN A1C
Hgb A1c MFr Bld: 5.9 % of total Hgb — ABNORMAL HIGH (ref <5.7)
Mean Plasma Glucose: 123 mg/dL
eAG (mmol/L): 6.8 mmol/L

## 2022-08-30 NOTE — Telephone Encounter (Signed)
Can you advise pt they do not have the emollient tretinoin its on backorder, they only have what she already has

## 2022-09-12 ENCOUNTER — Ambulatory Visit (INDEPENDENT_AMBULATORY_CARE_PROVIDER_SITE_OTHER): Payer: Medicare PPO

## 2022-09-12 DIAGNOSIS — I442 Atrioventricular block, complete: Secondary | ICD-10-CM

## 2022-09-16 ENCOUNTER — Ambulatory Visit: Payer: Medicare PPO

## 2022-09-16 ENCOUNTER — Ambulatory Visit (INDEPENDENT_AMBULATORY_CARE_PROVIDER_SITE_OTHER): Payer: Medicare PPO | Admitting: Nurse Practitioner

## 2022-09-16 VITALS — BP 134/90 | HR 60 | Temp 98.0°F | Ht 65.0 in | Wt 167.0 lb

## 2022-09-16 DIAGNOSIS — R0981 Nasal congestion: Secondary | ICD-10-CM | POA: Diagnosis not present

## 2022-09-16 DIAGNOSIS — R5383 Other fatigue: Secondary | ICD-10-CM | POA: Diagnosis not present

## 2022-09-16 DIAGNOSIS — U071 COVID-19: Secondary | ICD-10-CM

## 2022-09-16 DIAGNOSIS — S00522A Blister (nonthermal) of oral cavity, initial encounter: Secondary | ICD-10-CM

## 2022-09-16 DIAGNOSIS — R051 Acute cough: Secondary | ICD-10-CM | POA: Diagnosis not present

## 2022-09-16 MED ORDER — NYSTATIN 100000 UNIT/ML MT SUSP: OROMUCOSAL | 3 refills | Status: DC

## 2022-09-16 MED ORDER — MOLNUPIRAVIR EUA 200MG CAPSULE: 4.0000 | ORAL_CAPSULE | Freq: Two times a day (BID) | ORAL | 0 refills | Status: AC

## 2022-09-16 NOTE — Progress Notes (Signed)
THIS ENCOUNTER IS A VIRTUAL VISIT DUE TO COVID-19 - PATIENT WAS NOT SEEN IN THE OFFICE.  PATIENT HAS CONSENTED TO VIRTUAL VISIT / TELEMEDICINE VISIT   Virtual Visit via telephone Note  I connected with  Alexandra Wheeler on 09/16/2022 by telephone.  I verified that I am speaking with the correct person using two identifiers.    I discussed the limitations of evaluation and management by telemedicine and the availability of in person appointments. The patient expressed understanding and agreed to proceed.  History of Present Illness:  BP (!) 134/90   Pulse 60   Temp 98 F (36.7 C)   Ht 5\' 5"  (1.651 m)   Wt 167 lb (75.8 kg)   SpO2 98%   BMI 27.79 kg/m   81 y.o. patient contacted office reporting URI sx cough, nasal congestion, fatigue and sores inside the mouth. she tested positive by today by home test.  Recently traveld to [place], was on long and multiple flights.  Returned home 3 days ago and started feeling symptoms x2 days ago. OV was conducted by telephone to minimize exposure. This patient has not been vaccinated for covid 19.  Treatments tried so far: None  Exposures: Possible multiple d/t recent travel outside the country.l  Medications   Current Outpatient Medications (Cardiovascular):    hydrochlorothiazide (HYDRODIURIL) 25 MG tablet, Take 25 mg by mouth daily as needed (swelling).    metoprolol (TOPROL-XL) 200 MG 24 hr tablet, TAKE 1 TABLET EVERY DAY FOR BLOOD PRESSURE AND HEART   Current Outpatient Medications (Analgesics):    acetaminophen (TYLENOL) 500 MG tablet, Take 1,000 mg by mouth in the morning and at bedtime.  Current Outpatient Medications (Hematological):    FOLIC ACID PO, Take 1 tablet by mouth daily.   Current Outpatient Medications (Other):    Alcohol Swabs (B-D SINGLE USE SWABS REGULAR) PADS, Use as directed for Glucose monitoring Daily   ALPRAZolam (XANAX) 0.5 MG tablet, Take  1/2 - 1 tablet  Daily  ONLY  if needed for  Sleep or Tremor  &   limit to 5 days /week to avoid Addiction & Dementia   Ascorbic Acid (VITAMIN C PO), Take 1 tablet by mouth daily.    Calcium Carbonate-Vitamin D (CALCIUM + D PO), Take 1 tablet by mouth daily.    Cholecalciferol (VITAMIN D PO), Take 5,000 Units by mouth 2 (two) times a day.    citalopram (CELEXA) 20 MG tablet, TAKE 1 TABLET EVERY DAY FOR MOOD   Hydrocortisone Acetate 1 % OINT, Apply to Hemorrhoid 3 to 4  x /day   MAGNESIUM PO, Take 500 mg by mouth daily.    Multiple Vitamin (MULTIVITAMIN) tablet, Take 1 tablet by mouth daily.   Multiple Vitamins-Minerals (ZINC PO), Take 200 mg by mouth daily.   pantoprazole (PROTONIX) 40 MG tablet, Take 1 tablet Daily with Supper for Indigestion & Acid Reflux   Tretinoin, Facial Wrinkles, (TRETINOIN, EMOLLIENT,) 0.05 % CREA, Apply topically daily  Allergies:  Allergies  Allergen Reactions   Levsin [Hyoscyamine Sulfate]     unknown   Mobic [Meloxicam]     unknown    Topamax [Topiramate]     unknown    Zoloft [Sertraline Hcl]     unknown    Actonel [Risedronate Sodium] Other (See Comments)    Pt didn't like the way it made her feel    Boniva [Ibandronic Acid] Other (See Comments)    Pt didn't like the way it made her feel   Evista [  Raloxifene] Other (See Comments)    Pt didn't like the way it made her feel   Fosamax [Alendronate Sodium] Other (See Comments)    Pt didn't like the way it made her feel     Problem list She has Osteoporosis; DI (detrusor instability); Atrophic vaginitis; LBBB (left bundle branch block); CHB (complete heart block) (HCC); Vitamin D deficiency; Abnormal glucose; Essential hypertension; Hyperlipidemia, mixed; Medication management; Overweight (BMI 25.0-29.9); Hypertrophic obstructive cardiomyopathy (HCC); GERD (gastroesophageal reflux disease); Major depressive disorder, recurrent episode, mild with anxious distress (HCC); Pacemaker; Paroxysmal atrial fibrillation (HCC); Chronic anticoagulation; Familial tremor; Episodic  tension-type headache, not intractable; Cervical spinal meningocele (HCC); HOCM (hypertrophic obstructive cardiomyopathy) (HCC); Thoracic aortic aneurysm (HCC) by Chest CTA 12/2020; Former smoker; FHx: heart disease; and Tenderness of both temporomandibular joints on their problem list.   Social History:   reports that she quit smoking about 46 years ago. Her smoking use included cigarettes. She has never used smokeless tobacco. She reports that she does not drink alcohol and does not use drugs.  Observations/Objective:  General : Well sounding patient in no apparent distress HEENT: no hoarseness, no cough for duration of visit Lungs: speaks in complete sentences, no audible wheezing, no apparent distress Neurological: alert, oriented x 3 Psychiatric: pleasant, judgement appropriate   Assessment and Plan:  Covid 19 Covid 19 positive per rapid screening test in home Risk factors include: LBBB, CHB, HTN, cardiomyopathy, afib, TAA Symptoms are: Moderate and managed. Due to co morbid conditions and risk factors, discussed antivirals Molnupiravir. Immue support reviewed Vitamin C, zinc.  Take tylenol PRN temp 101+ Push hydration Regular ambulation or calf exercises exercises for clot prevention and 81 mg ASA unless contraindicated Sx supportive therapy suggested Follow up via mychart or telephone if needed Advised patient obtain O2 monitor; present to ED if persistently <90% or with severe dyspnea, CP, fever uncontrolled by tylenol, confusion, sudden decline Should remain in isolation until at least 5 days from onset of sx, 24-48 hours fever free without tylenol, sx such as cough are improved.   1. COVID-19  - molnupiravir EUA (LAGEVRIO) 200 mg CAPS capsule; Take 4 capsules (800 mg total) by mouth 2 (two) times daily for 5 days.  Dispense: 40 capsule; Refill: 0  2. Blister (nonthermal) of oral cavity, initial encounter  - magic mouthwash (nystatin, hydrocortisone, diphenhydrAMINE)  suspension; Take  1 teaspoon (5 ml)   Swish & Swallow  every 2 hours for mouth ulcers  Dispense: 240 mL; Refill: 3  3. Acute cough   4. Nasal congestion   5. Other fatigue     Follow Up Instructions:  I discussed the assessment and treatment plan with the patient. The patient was provided an opportunity to ask questions and all were answered. The patient agreed with the plan and demonstrated an understanding of the instructions.   The patient was advised to call back or seek an in-person evaluation if the symptoms worsen or if the condition fails to improve as anticipated.  I provided 15 minutes of non-face-to-face time during this encounter.   Adela Glimpse, NP

## 2022-09-17 LAB — CUP PACEART REMOTE DEVICE CHECK
Battery Remaining Longevity: 1 mo
Battery Remaining Percentage: 1 %
Battery Voltage: 2.63 V
Brady Statistic AP VP Percent: 97 %
Brady Statistic AP VS Percent: 1 %
Brady Statistic AS VP Percent: 3.2 %
Brady Statistic AS VS Percent: 1 %
Brady Statistic RA Percent Paced: 97 %
Brady Statistic RV Percent Paced: 99 %
Date Time Interrogation Session: 20231110013724
Implantable Lead Connection Status: 753985
Implantable Lead Connection Status: 753985
Implantable Lead Implant Date: 20140805
Implantable Lead Implant Date: 20140805
Implantable Lead Location: 753859
Implantable Lead Location: 753860
Implantable Lead Model: 1944
Implantable Lead Model: 1948
Implantable Pulse Generator Implant Date: 20140805
Lead Channel Impedance Value: 580 Ohm
Lead Channel Impedance Value: 600 Ohm
Lead Channel Pacing Threshold Amplitude: 0.75 V
Lead Channel Pacing Threshold Amplitude: 1.5 V
Lead Channel Pacing Threshold Pulse Width: 0.4 ms
Lead Channel Pacing Threshold Pulse Width: 0.8 ms
Lead Channel Sensing Intrinsic Amplitude: 12 mV
Lead Channel Sensing Intrinsic Amplitude: 2.6 mV
Lead Channel Setting Pacing Amplitude: 1 V
Lead Channel Setting Pacing Amplitude: 3 V
Lead Channel Setting Pacing Pulse Width: 0.4 ms
Lead Channel Setting Sensing Sensitivity: 4 mV
Pulse Gen Model: 2240
Pulse Gen Serial Number: 7523768

## 2022-09-20 ENCOUNTER — Telehealth: Payer: Self-pay

## 2022-09-20 NOTE — Telephone Encounter (Signed)
LM-09/20/22-Chart Prep started, Reviewing OV, Consults, Hospital visits, Labs and medication changes.  Chart prep complete for upcoming CP visit.  Total time spent: 44 min.

## 2022-09-21 NOTE — Telephone Encounter (Signed)
LM-09/21/22-Care plan created for upcoming CP visit.   Total time spent: 8 min.

## 2022-09-23 NOTE — Telephone Encounter (Signed)
LM-09/23/22-Called patient and confirmed upcoming CP phone visit for 11/21 at 10:30AM. Pt. Has no health concerns she'd like to discuss. Pt. Did note she has covid and her head is hurting from congestion, but she feels a lot better. Advised pt. Hydrate and rest. Pt. Verbalized understanding and agreed.  Total time spent: 10 min.

## 2022-09-27 ENCOUNTER — Ambulatory Visit: Payer: Medicare PPO | Admitting: Pharmacy Technician

## 2022-09-27 DIAGNOSIS — I10 Essential (primary) hypertension: Secondary | ICD-10-CM

## 2022-09-27 DIAGNOSIS — Z79899 Other long term (current) drug therapy: Secondary | ICD-10-CM

## 2022-09-27 DIAGNOSIS — E782 Mixed hyperlipidemia: Secondary | ICD-10-CM

## 2022-10-03 NOTE — Telephone Encounter (Signed)
LM-10/03/22-Initial CP visit notes edited and uploaded to pts. Documents for CP to review.  Total time spent: 4 min.

## 2022-10-04 NOTE — Progress Notes (Signed)
Initial Pharmacist Visit (CCM)  Alexandra Wheeler,Alexandra Wheeler  25 years, Female  DOB: Mar 28, 1941  M:   __________________________________________________ Chronic Conditions Patient's Chronic Conditions: Hypertension (HTN), Atrial Fibrillation, Gastroesophageal Reflux Disease (GERD), Osteopenia or Osteoporosis, Hyperlipidemia/Dyslipidemia (HLD), Depression, Diabetes (DM)  Summary for PCP:  1. Patient declines HLD treatment. 2. Patient declines Osteoporosis treatment. 3. Patient still recovering from [place].  Doctor and Hospital Visits Were there PCP Visits in last 6 months?: Yes PCP Visits details: 08/29/22-Dana Carlye Grippe, NP-Pt. presented for AWV and f/u, BP 112/84 HR 74.  STARTED Tretinoin (Facial Wrinkles) 0.05 % Apply topically daily  STOPPED Ezetimibe 10 mg Oral Daily Tretinoin 0.025 % Apply  Daily  as Directed  09/16/22-Tonya Cranford, NP- Pt. presented for virtual visit d/t covid positive. BP 134/90 HR 60. STARTED magic mouthwash (nystatin, hydrocortisone, ... Take  1 teaspoon (5 ml)   Swish & Swallow  every 2 hours for mouth ulcers Molnupiravir 800 mg Oral 2 times daily  Were there Specialist Visits in last 6 months?: Yes Specialist Visits details: 04/18/22-Ursuy, Brien Few, PA-C (Cardiology)-Pt. presented for f/u. BP 134/84 HR 71. No medication changes noted.  04/29/22-Pemberton, Greer Ee, MD (Cardiology)-Pt. presented for f/u. BP 130/80 HR 62. STARTED Ezetimibe 10 mg Oral Daily  08/19/22-Camnitz, Ocie Doyne, MD (Cardiology)-Pt. presented for evaluation of A-fib.  BP 120/78 HR 62. No medication changes noted.  Was there a Hospital Visit in last 30 days?: No Were there other Hospital Visits in last 6 months?: No  Disease Assessments Subjective Information Visit Completed on: 09/27/2022 Did patient bring medications to appointment?: No What source (s) was used to reconcile medications this visit?: EMR list, Verbal recall from patient/caregiver  Subjective: Very sweet lady  presenting via phone for CCM initial visit. She is widowed. She lives with her daughter and grandson. She is able to complete all ADLs and still drives. She likes to stay active and busy. She is recovering from [place]. She complains of lingering cough and fatigue. No loss of appetite and is staying hydrated well. Lifestyle habits: Diet: Limit salt, limit dairy, limit fats Exercise: Stretching, walking daily Tobacco: None Alcohol: None Caffeine: Coffee, ginger ale Recreational drugs: None What is the patient's sleep pattern?: No sleep issues How many hours per night does patient typically sleep?: 8-9  SDOH: [place] [address] Needs Screening Tool ([email or web address]) SDOH questions were documented and reviewed (EMR or [employer]) within the past 12 months or since hospitalization?: No What is your living situation today? (ref #1): I have a steady place to live Think about the place you live. Do you have problems with any of the following? (ref #2): None of the above Within the past 12 months, you worried that your food would run out before you got money to buy more (ref #3): Never true Within the past 12 months, the food you bought just didn't last and you didn't have money to get more (ref #4): Never true In the past 12 months, has lack of reliable transportation kept you from medical appointments, meetings, work or from getting things needed for daily living? (ref #5): No In the past 12 months, has the electric, gas, oil, or water company threatened to shut off services in your home? (ref #6): No How often does anyone, including family and friends, physically hurt you? (ref #7): Never (1) How often does anyone, including family and friends, insult or talk down to you? (ref #8): Never (1) How often does anyone, including friends and family, threaten you with harm? (ref #9): Never (  1) How often does anyone,  including family and friends, scream or curse at you? (ref #10): Never (1)  Medication Adherence Does the [facility] have access to medication refill history?: Yes Medication adherence rates for STAR metric medications: Pt. not taking any STAR metric medication Medication adherence rates for non-STAR metric medications:  Metoprolol ER 200mg  / 90DS / 03/16/22 & 06/01/22  Factors that may affect medication adherence?: Pill burden Name and location of Current pharmacy: [place] [facility] Current Rx insurance plan: [employer] Are meds synced by current pharmacy?: No Are meds delivered by current pharmacy?: Yes - by mail order pharmacy Would patient benefit from direct intervention of clinical lead in dispensing process to optimize clinical outcomes?: No Are [facility] pharmacy services available where patient lives?: Yes Is patient disadvantaged to use [facility]?: Yes Does patient experience delays in picking up medications due to transportation concerns (getting to pharmacy)?: No Medication organization: Pill Box Assessment:: Adherent  Hypertension (HTN) Most Recent BP: 134/90 Most Recent HR: 60 taken on: 09/16/2022 Care Gap: Need BP documented or last BP 140/90 or higher: Addressed Assessed today?: Yes BP today is: 131/76 Heart Rate is: 80 Goal: <130/80 mmHG Is Patient checking BP at home?: Yes Patient home BP readings are ranging: 120s-130/80s Has patient experienced hypotension, dizziness, falls or bradycardia?: No We discussed: DASH diet:  following a diet emphasizing fruits and vegetables and low-fat dairy products along with whole grains, fish, poultry, and nuts. Reducing red meats and sugars., Reducing the amount of salt intake to 1500mg /per day., Proper Home BP Measurement, Increasing exercise (walking, biking, swimming) to a goal of 30 minutes per day, as able based on current activity level and health or as directed by your healthcare provider., Contacting PCP office for signs and  symptoms of high or low blood pressure (hypotension, dizziness, falls, headaches, edema) Assessment:: Controlled Drug: Metoprolol 200mg -1 tab QD Pharmacist Assessment: Appropriate, Effective, Safe, Accessible Drug: HCTZ 25mg  daily Pharmacist Assessment: Appropriate, Effective, Safe, Accessible  Hyperlipidemia/Dyslipidemia (HLD) Last Lipid panel on: 08/29/2022 TC (Goal<200): 195 LDL: 117 HDL (Goal>40): 55 TG (Goal<150): 121 ASCVD 10-year risk?is:: N/A due to Age > 65 Assessed today?: Yes LDL Goal: <70 Additional Info: Patient declines treatment and wants to work on Diet and Lifestyle Modifications We discussed: Complications of hyperlipidemia and prevention methods with patient for 5-10 minutes, Increasing exercise (walking, biking, swimming) to a goal of 30 minutes per day, as able based on current activity level and health or as directed by your healthcare provider, How a diet high in fruits/vegetables/nuts/whole grains/beans may help to reduce your cholesterol. Increasing soluble fiber intake.  Avoiding sugary foods and trans fat, limiting carbohydrates, and reducing portion sizes. Recommended increasing intake of healthy fats into their diet, Weight reduction- We discussed losing 5-10% of body weight Assessment:: Uncontrolled Drug: None Pharmacist Assessment: Query Appropriateness Plan/Follow up: Patient declined treatment. Discussed diet and lifestyle modifications.  Diabetes (DM) Most recent A1C: 5.9 taken on: 08/29/2022 Previous A1C: 5.9 taken on: 01/17/2022 Most Recent GFR: 80 taken on: 08/29/2022 Type: Pre-Diabetes/Impaired Fasting Glucose Assessed today?: No Drug: None  Depression Most recent PHQ-9 Score: 2 taken on: 08/29/2022 Assessed today?: No Drug: Citalopram 20mg -1 tab QD Pharmacist Assessment: Appropriate, Effective, Safe, Accessible  GERD Assessed today?: No Drug: pantoprazole 40mg -1 tab QD Pharmacist Assessment: Query Appropriateness  Atrial  Fibrillation Most recent INR: 0.98 taken on: 06/10/2013 Previous INR: Unknown Assessed today?: No Drug: None  Pharmacist Assessment: Query Appropriateness Plan/Follow up: Patient has a pacemaker  Osteopenia or Osteoporosis Most recent T-score: -  3.0 taken on: 07/20/2022 Previous T-score: Unknown Most recent Vitamin D 25-OH: 65 taken on: 01/17/2022 Previous Vitamin D 25-OH: 93 taken on: 01/08/2021 Assessed today?: No Drug: Calcium Carbonate-vit D-1 tab QD Pharmacist Assessment: Query Appropriateness Plan/Follow up: Patient declines treatment.  Preventative Health Care Gap: Colorectal cancer screening: Patient excluded from population (Age > 47, hx of colorectal cancer or colectomy, hospice services) Care Gap: Breast cancer screening: Patient excluded from population (Age > 52, hx of bilateral mastectomy, frailty, hospice services) Care Gap: Annual Wellness Visit (AWV): Addressed Additional exercise counseling points. We discussed: incorporating flexibility, balance, and strength training exercises, targeting at least 150 minutes per week of moderate-intensity aerobic exercise. Additional diet counseling points. We discussed: key components of the DASH diet, key components of a low-carb eating plan, aiming to consume at least 8 cups of water day  CPP Prep: CPP Televisit: CPP Doc: CPP Care Plan:  Summary Next CCM Follow Up: 6 months Next AWV: 04/26/23 at 11:00AM Next PCP Visit: 01/23/23 at 2:00PM  Attestation Statement:: CCM Services:  This encounter meets complex CCM services and moderate to high medical decision making.  Prior to outreach and patient consent for Chronic Care Management, I referred this patient for services after reviewing the nominated patient list or from a personal encounter with the patient.  I have personally reviewed this encounter including the documentation in this note and have collaborated with the care management provider  regarding care management and care coordination activities to include development and update of the comprehensive care plan I am certifying that I agree with the content of this note and encounter as supervising physician.  Pharmacy Interventions Intervention Details Pharmacist Interventions discussed: Yes Education: Lifestyle modifications  Doree Barthel, PharmD Clinical Pharmacist [email or web address] [phone]

## 2022-10-05 NOTE — Telephone Encounter (Signed)
LM-10/05/22-Pt. Called requesting to speak to CP. Informed pt. That CP was unavailable. Pt. Stated she is still getting over covid and has some congestion and feels like her right ear is stopped up. Pt. Requested medication recommendation from CP or if she could prescribe any medication for it. Informed pt. That I could ask CP Denny Peon of recommendations but her PCP would be able to call something in if needed. Pt. Request I speak to CP first. Informed pt. I will let CP know and will call her back. Pt. Verbalized understanding and agreed.  Total time spent: 12 min.

## 2022-10-05 NOTE — Telephone Encounter (Signed)
LM-10/05/22-Called pt. Back and was unable to reach her. Left detailed message letting pt. Know she can try Chlorpheniramine 4mg  tabs twice daily as needed and that it's in the allergy section at the pharmacy. May have to ask an employee because they mix it in with Zyrtec and they look similar. Also informed pt. She needs to drink plenty of water and if she notices any drowsiness let me know. Or if symptoms continue after Friday let me know and to call if she has any questions/concerns.  Total time spent: 3 min.

## 2022-10-06 DIAGNOSIS — I10 Essential (primary) hypertension: Secondary | ICD-10-CM | POA: Diagnosis not present

## 2022-10-06 DIAGNOSIS — E782 Mixed hyperlipidemia: Secondary | ICD-10-CM | POA: Diagnosis not present

## 2022-10-11 ENCOUNTER — Telehealth: Payer: Self-pay

## 2022-10-11 NOTE — Telephone Encounter (Signed)
Alert received from [employer]:  [facility]lin alert for ERI, triggered 10/10/22.   Outreach made to Pt.  Advised of device reaching RRT.  Advised would send to Dr. Elberta Fortis' nurse to schedule gen change.

## 2022-10-11 NOTE — Progress Notes (Addendum)
Patient called into in stating that she had been having trouble with her ear being stopped up and the feeling that it needed to pop. She was advised to take allergy medication to help and she states that it is not helping and she would like to know other suggestions from [facility] or Dr. Oneta Rack. Please advise.   Edwina Barth, HC Time spent:10 min.   Avery-- Pt needs to schedule to be seen. This has been going on for 2 weeks post COVID. Will send to scheduling

## 2022-10-13 ENCOUNTER — Ambulatory Visit (INDEPENDENT_AMBULATORY_CARE_PROVIDER_SITE_OTHER): Payer: Medicare PPO

## 2022-10-13 DIAGNOSIS — I442 Atrioventricular block, complete: Secondary | ICD-10-CM

## 2022-10-13 LAB — CUP PACEART REMOTE DEVICE CHECK
Battery Remaining Longevity: 0 mo
Battery Voltage: 2.59 V
Brady Statistic AP VP Percent: 96 %
Brady Statistic AP VS Percent: 1 %
Brady Statistic AS VP Percent: 3.4 %
Brady Statistic AS VS Percent: 1 %
Brady Statistic RA Percent Paced: 96 %
Brady Statistic RV Percent Paced: 99 %
Date Time Interrogation Session: 20231207040013
Implantable Lead Connection Status: 753985
Implantable Lead Connection Status: 753985
Implantable Lead Implant Date: 20140805
Implantable Lead Implant Date: 20140805
Implantable Lead Location: 753859
Implantable Lead Location: 753860
Implantable Lead Model: 1944
Implantable Lead Model: 1948
Implantable Pulse Generator Implant Date: 20140805
Lead Channel Impedance Value: 560 Ohm
Lead Channel Impedance Value: 590 Ohm
Lead Channel Pacing Threshold Amplitude: 0.75 V
Lead Channel Pacing Threshold Amplitude: 1.5 V
Lead Channel Pacing Threshold Pulse Width: 0.4 ms
Lead Channel Pacing Threshold Pulse Width: 0.8 ms
Lead Channel Sensing Intrinsic Amplitude: 12 mV
Lead Channel Sensing Intrinsic Amplitude: 2.3 mV
Lead Channel Setting Pacing Amplitude: 1 V
Lead Channel Setting Pacing Amplitude: 3 V
Lead Channel Setting Pacing Pulse Width: 0.4 ms
Lead Channel Setting Sensing Sensitivity: 4 mV
Pulse Gen Model: 2240
Pulse Gen Serial Number: 7523768

## 2022-10-19 NOTE — Progress Notes (Signed)
Remote pacemaker transmission.   

## 2022-10-19 NOTE — Addendum Note (Signed)
Addended by: Geralyn Flash D on: 10/19/2022 09:34 AM   Modules accepted: Level of Service

## 2022-10-25 NOTE — Telephone Encounter (Signed)
Pt scheduled for 11/25/2022. She understands office will call to go over instructions soon. She is agreeable to plan.

## 2022-10-26 ENCOUNTER — Ambulatory Visit
Admission: RE | Admit: 2022-10-26 | Discharge: 2022-10-26 | Disposition: A | Discharge status: Home / Self Care | Payer: Medicare PPO | Source: Ambulatory Visit | Attending: Internal Medicine | Admitting: Internal Medicine

## 2022-10-26 DIAGNOSIS — Z1231 Encounter for screening mammogram for malignant neoplasm of breast: Secondary | ICD-10-CM

## 2022-10-26 NOTE — Progress Notes (Unsigned)
Cardiology Office Note:    Date:  10/26/2022   ID:  Alexandra, Wheeler 1941/05/24, MRN [id number]  PCP:  Lucky Cowboy, MD   [facility]  Cardiologist:  Meriam Sprague, MD  Advanced Practice Provider:  Beatrice Lecher, PA-C Electrophysiologist:  Hillis Range, MD    Referring MD: Lucky Cowboy, MD     History of Present Illness:    Alexandra Wheeler is a 81 y.o. female with a hx of paroxysmal Afib, CHB s/p PPM placement 06/2013, HTN, HLD, LBBB, non-obstructive CAD noted on cath in 2014, hypertrophic CM without LVOT obstruction who is followed by Dr. Johney Frame who now presents to clinic for follow-up.  Patient was previously followed by Dr. Shirlee Latch and last seen in clinic in 2018. She has a hx of HCM with asymmetric septal hypertrophy, MV systolic anterior motion and no significant LVOT gradient at rest.  She had a low risk ETT in 2016.  Her last echocardiogram was in 2018 and was c/w HCM.  She saw Dr. Johney Frame in 12/2018 and was last seen by EP in 12/2019 by Francis Dowse, PA-C.  Of note, her Apixaban was DC'd in 08/2019 due to low burden of AF on her pacer.    She was seen by Tereso Newcomer on 10/26/20 after calling with episode of chest pain and near syncope. Her device was interrogated and no abnormalities were detected. Thought her symptoms were related to taking extra doses of HCTZ. TTE stable with no LVOT gradient, G1DD, mild SAM with mild MR.  Was last seen in clinic on 08/2022 by Dr. Elberta Fortis for Afib. PPM was also near ERI and she was amenable for generator change. She declined AC due to <1% burden of Afib on PPM.  Today, ***  Past Medical History:  Diagnosis Date   Anxiety    Atrophic vaginitis    Chest pain    a. Adenosine sestamibi (1/14) with no evidence of ischemia or infarction, EF 63%.    Colon polyps    Complete heart block (HCC) 06-2013   status post pacemaker implantation by Dr Johney Frame 06-11-2013   Depression    DI (detrusor  instability)    GERD (gastroesophageal reflux disease)    History + Hyplori via EGD   H/O echocardiogram    a. Echo 11/2012:EF 50-55%, focal basal septal hypertrophy with some mitral valve SAM (mild) and MR.  This may be a sigmoid septum with advanced age or could be a variant of hypertrophic cardiomyopathy.   HOCM (hypertrophic obstructive cardiomyopathy) (HCC)    Echocardiogram 2/22: EF 60-65, no RWMA, severe asymmetric septal LVH, no LVOT, Gr 1 DD, mild SAM, mod MR, mild LAE, mod to severe TR, Asc Ao 42 mm, normal RVSF, RVSP 28   HTN (hypertension)    Hyperlipidemia    LBBB (left bundle branch block)    Leukopenia 05/19/2014   Lymphocytosis 05/19/2014   Migraines    Myoview    Myoview 12/21: EF 69, no ischemia or infarction; low risk    Osteoporosis    Paroxysmal atrial fibrillation (HCC)    Prediabetes    Thoracic aortic aneurysm (HCC)    Chest CTA 2/22: Ascending thoracic aortic aneurysm 44 mm; Ao arch 34 mm>>repeat 1 year   Vitamin D deficiency     Past Surgical History:  Procedure Laterality Date   BREAST BIOPSY Left 07/25/2019   ANGIOLIPOMA   BREAST EXCISIONAL BIOPSY Right    BREAST SURGERY     Breast cyst  CATARACT EXTRACTION Left 2019   Dr. Dione Booze   HEMORRHOID SURGERY     LEFT HEART CATHETERIZATION WITH CORONARY ANGIOGRAM N/A 06/11/2013   Procedure: LEFT HEART CATHETERIZATION WITH CORONARY ANGIOGRAM;  Surgeon: Kathleene Hazel, MD;  Location: [facility] CATH LAB;  Service: Cardiovascular;  Laterality: N/A;   PACEMAKER INSERTION  06-11-2013   STJ dual chamber pacemaker implanted by Dr Johney Frame for complete heart block   PERMANENT PACEMAKER INSERTION Left 06/11/2013   Procedure: PERMANENT PACEMAKER INSERTION;  Surgeon: Kathleene Hazel, MD;  Location: [facility] CATH LAB;  Service: Cardiovascular;  Laterality: Left;   PERMANENT PACEMAKER INSERTION N/A 06/11/2013   Procedure: PERMANENT PACEMAKER INSERTION;  Surgeon: Hillis Range, MD;  Location: [facility] CATH LAB;  Service: Cardiovascular;   Laterality: N/A;   TEMPORARY PACEMAKER INSERTION N/A 06/11/2013   Procedure: TEMPORARY PACEMAKER INSERTION;  Surgeon: Kathleene Hazel, MD;  Location: [facility] CATH LAB;  Service: Cardiovascular;  Laterality: N/A;   TUBAL LIGATION      Current Medications: No outpatient medications have been marked as taking for the 11/03/22 encounter (Appointment) with Meriam Sprague, MD.     Allergies:   Levsin [hyoscyamine sulfate], Mobic [meloxicam], Topamax [topiramate], Zoloft [sertraline hcl], Actonel [risedronate sodium], Boniva [ibandronic acid], Evista [raloxifene], and Fosamax [alendronate sodium]   Social History   Socioeconomic History   Marital status: Widowed    Spouse name: Not on file   Number of children: Not on file   Years of education: Not on file   Highest education level: Not on file  Occupational History   Occupation: Part-time at [employer]  Tobacco Use   Smoking status: Former    Types: Cigarettes    Quit date: 10/09/1975    Years since quitting: 47.0   Smokeless tobacco: Never  Vaping Use   Vaping Use: Never used  Substance and Sexual Activity   Alcohol use: No   Drug use: No   Sexual activity: Never    Birth control/protection: Surgical, Post-menopausal  Other Topics Concern   Not on file  Social History Narrative   Lives alone. Dtr in the area.   Social Determinants of Health   Financial Resource Strain: Not on file  Food Insecurity: Not on file  Transportation Needs: Not on file  Physical Activity: Not on file  Stress: Not on file  Social Connections: Not on file     Family History: The patient's family history includes Aneurysm in her brother; Breast cancer (age of onset: 20) in her sister; Cancer in her father; Diabetes in her brother, brother, and mother; Heart disease in her brother, brother, and mother; Hypertension in her brother; Stroke in her brother.  ROS:   Please see the history of present illness.    Review of Systems  Constitutional:   Negative for chills and fever.  HENT:  Negative for hearing loss.   Eyes:  Negative for blurred vision and redness.  Respiratory:  Positive for shortness of breath.   Cardiovascular:  Negative for chest pain, palpitations, orthopnea, claudication, leg swelling and PND.  Gastrointestinal:  Negative for abdominal pain and melena.  Genitourinary:  Negative for dysuria and flank pain.  Musculoskeletal:  Positive for joint pain and myalgias. Negative for falls.  Neurological:  Negative for dizziness and loss of consciousness.  Endo/Heme/Allergies:  Negative for polydipsia.  Psychiatric/Behavioral:  Negative for substance abuse.     EKGs/Labs/Other Studies Reviewed:    The following studies were reviewed today:  TTE 05/2022: IMPRESSIONS     1. Left ventricular  ejection fraction, by estimation, is 60 to 65%. The  left ventricle has normal function. The left ventricle has no regional  wall motion abnormalities. There is moderate asymmetric left ventricular  hypertrophy of the basal-septal  segment. Left ventricular diastolic parameters are consistent with Grade I  diastolic dysfunction (impaired relaxation). The average left ventricular  global longitudinal strain is -17.2 % (borderline)   2. Right ventricular systolic function is normal. The right ventricular  size is not well visualized. There is mildly elevated pulmonary artery  systolic pressure.   3. The mitral valve is grossly normal. Mild mitral valve regurgitation.   4. The aortic valve is tricuspid. Aortic valve regurgitation is trivial.  No aortic stenosis is present.   5. Aortic ascending aortic aneurysm (45 mm).   6. Left atrial size was mildly dilated.   7. The inferior vena cava is normal in size with greater than 50%  respiratory variability, suggesting right atrial pressure of 3 mmHg.   Conclusion(s)/Recommendation(s): Compared to prior study, MR has improved.   TTE 12/29/20: 1. Left ventricular ejection fraction, by  estimation, is 60 to 65%. The  left ventricle has normal function. The left ventricle demonstrates  regional wall motion abnormalities with septal-lateral dyssynchrony. There  is severe asymmetric basal to mid  septal left ventricular hypertrophy. No LV outflow gradient. Left  ventricular diastolic parameters are consistent with Grade I diastolic  dysfunction (impaired relaxation).   2. The mitral valve shows mild systolic anterior motion. Moderate mitral  valve regurgitation. No evidence of mitral stenosis.   3. Left atrial size was mildly dilated.   4. Tricuspid valve regurgitation is moderate to severe.   5. The aortic valve is tricuspid. Aortic valve regurgitation is trivial.  No aortic stenosis is present.   6. Aortic dilatation noted. There is mild dilatation of the ascending  aorta, measuring 42 mm.   7. Right ventricular systolic function is normal. The right ventricular  size is mildly enlarged. There is normal pulmonary artery systolic  pressure. The estimated right ventricular systolic pressure is 28.0 mmHg.   8. The inferior vena cava is normal in size with greater than 50%  respiratory variability, suggesting right atrial pressure of 3 mmHg.   9. This echo is consistent with hypertrophic cardiomyopathy.  Echocardiogram 03/10/2017 Severe focal basal septal hypertrophy, moderate hypertrophy of the myocardium, EF 60-65, normal wall motion, GR 1 DD, mild AI, trivial MR, normal RVSF, moderate TR, PASP 38   ETT 09/2015 Normal BP response, PVCs, no NSVT; low risk study   Echocardiogram 07/31/2015 Moderate asymmetric septal hypertrophy, EF 55-60, normal wall motion, G1 DD, mild AI, moderate S.A.M., moderate MR, moderate TR, peak LVOT gradient 9 mmHg   Cardiac catheterization 06/11/2013 LM ostial 10 LAD mid mild irregularities EF 50-55   Myoview 11/26/2012 EF 63, no ischemia   Recent Labs: 01/17/2022: Magnesium 2.4; TSH 0.65 08/29/2022: ALT 12; BUN 14; Creat 0.75; Hemoglobin  13.3; Platelets 201; Potassium 4.4; Sodium 140  Recent Lipid Panel    Component Value Date/Time   CHOL 195 08/29/2022 1608   TRIG 121 08/29/2022 1608   HDL 55 08/29/2022 1608   CHOLHDL 3.5 08/29/2022 1608   VLDL 20 04/11/2017 1025   LDLCALC 117 (H) 08/29/2022 1608     Risk Assessment/Calculations:    CHA2DS2-VASc Score = 5  This indicates a 7.2% annual risk of stroke. The patient's score is based upon: CHF History: 0 HTN History: 1 Diabetes History: 0 Stroke History: 0 Vascular Disease History: 1 Age  Score: 2 Gender Score: 1     Physical Exam:    VS:  There were no vitals taken for this visit.    Wt Readings from Last 3 Encounters:  09/16/22 167 lb (75.8 kg)  08/29/22 167 lb 6.4 oz (75.9 kg)  08/19/22 169 lb (76.7 kg)     GEN:  Comfortable, NAD HEENT: Normal NECK: No JVD; No carotid bruits CARDIAC: RRR, 2/6 mid-peaking systolic murmur. No rubs or gallops RESPIRATORY:  Clear to auscultation without rales, wheezing or rhonchi  ABDOMEN: Soft, non-tender, non-distended MUSCULOSKELETAL:  No edema; No deformity  SKIN: Warm and dry NEUROLOGIC:  Alert and oriented x 3 PSYCHIATRIC:  Normal affect   ASSESSMENT:    No diagnosis found.  PLAN:    In order of problems listed above:  #HCM: No LVOT obstruction on TTE. Low risk ETT in 2016. No MRI due to PPM. Episode of syncope in 2022 appeared to be secondary to taking extra doses of HCTZ with resultant dehydration. No recurrence of symptoms. TTE 12/2020 with LVEF 60-65%, no LVOT obstruction, mild SAM with moderate MR. Repeat TTE 05/2022 with EF 60-65%, no LVOTO, mild MR, ascending aortic aneurysm 49mm. -Continue metop 200mg  XL daily -ETT low risk -No arrhythmias on cardiac monitor -Maintain adequate hydration -Only using HCTZ as needed for LE edema  #Chest Pain: Resolved. Cath 2014 with minimal plaque. Exercise myoview negative for ischemia and normal EF.  TTE 05/2022 with EF 60-65%, moderate basal septal  hypertrophy, G1DD, no LVOTO, mild MR, ascending aortic aneurysm 92mm.  #Complete Heart Block: S/p PPM placement. -Follow-up with EP as scheduled   #Paroxysmal Afib: <1% burden on PPM. Has declined AC. Follows with Dr. 48m.  #Moderate MR with mild SAM>>Mild MR: Improved on most recent TTE,. Will continue with monitoring. -Continue to monitor  #Moderate-to-severe Tricuspid Regurgitation>Mild: Mild on most recent TTE  #HTN: -Continue metop 200mg  XL daily  #HLD: Intolerance to statins. Patient has not yet started on zetia. Discussed we do recommend her starting given elevated LDL and mild plaque on cath. Will prescribe zetia today and patient will read about it further. -Start zetia 10mg  daily -Lipids at next visit  #Aortic Dilation: Mild on CTA 12/2021 measuring 31mm.  -Continue annual monitoring    Medication Adjustments/Labs and Tests Ordered: Current medicines are reviewed at length with the patient today.  Concerns regarding medicines are outlined above.  No orders of the defined types were placed in this encounter.  No orders of the defined types were placed in this encounter.   There are no Patient Instructions on file for this visit.    Signed, , MD  10/26/2022 6:00 PM    [facility]

## 2022-11-02 ENCOUNTER — Other Ambulatory Visit: Payer: Self-pay

## 2022-11-02 DIAGNOSIS — I442 Atrioventricular block, complete: Secondary | ICD-10-CM

## 2022-11-03 ENCOUNTER — Encounter: Payer: Self-pay | Admitting: Cardiology

## 2022-11-03 ENCOUNTER — Telehealth: Payer: Self-pay | Admitting: Cardiology

## 2022-11-03 ENCOUNTER — Ambulatory Visit: Payer: Medicare PPO | Attending: Cardiology | Admitting: Cardiology

## 2022-11-03 VITALS — BP 130/82 | HR 55 | Ht 65.0 in | Wt 168.4 lb

## 2022-11-03 DIAGNOSIS — I442 Atrioventricular block, complete: Secondary | ICD-10-CM | POA: Diagnosis not present

## 2022-11-03 DIAGNOSIS — I480 Paroxysmal atrial fibrillation: Secondary | ICD-10-CM | POA: Diagnosis not present

## 2022-11-03 DIAGNOSIS — I10 Essential (primary) hypertension: Secondary | ICD-10-CM | POA: Diagnosis not present

## 2022-11-03 DIAGNOSIS — I77810 Thoracic aortic ectasia: Secondary | ICD-10-CM

## 2022-11-03 DIAGNOSIS — Z950 Presence of cardiac pacemaker: Secondary | ICD-10-CM

## 2022-11-03 DIAGNOSIS — I421 Obstructive hypertrophic cardiomyopathy: Secondary | ICD-10-CM

## 2022-11-03 DIAGNOSIS — I77819 Aortic ectasia, unspecified site: Secondary | ICD-10-CM | POA: Diagnosis not present

## 2022-11-03 NOTE — Progress Notes (Signed)
Cardiology Office Note:    Date:  11/03/2022   ID:  Alexandra, [name] 04-26-1941, MRN [id number]  PCP:  Lucky Cowboy, MD   [facility]  Cardiologist:  Meriam Sprague, MD  Advanced Practice Provider:  Beatrice Lecher, PA-C Electrophysiologist:  Hillis Range, MD    Referring MD: Lucky Cowboy, MD     History of Present Illness:    Alexandra Wheeler is a 81 y.o. female with a hx of paroxysmal Afib, CHB s/p PPM placement 06/2013, HTN, HLD, LBBB, non-obstructive CAD noted on cath in 2014, hypertrophic CM without LVOT obstruction who is followed by Dr. Johney Frame who now presents to clinic for follow-up.  Patient was previously followed by Dr. Shirlee Latch and last seen in clinic in 2018. She has a hx of HCM with asymmetric septal hypertrophy, MV systolic anterior motion and no significant LVOT gradient at rest.  She had a low risk ETT in 2016.  Her last echocardiogram was in 2018 and was c/w HCM.  She saw Dr. Johney Frame in 12/2018 and was last seen by EP in 12/2019 by Francis Dowse, PA-C.  Of note, her Apixaban was DC'd in 08/2019 due to low burden of AF on her pacer.    She was seen by Tereso Newcomer on 10/26/20 after calling with episode of chest pain and near syncope. Her device was interrogated and no abnormalities were detected. Thought her symptoms were related to taking extra doses of HCTZ. TTE stable with no LVOT gradient, G1DD, mild SAM with mild MR.  Was last seen in clinic on 08/2022 by Dr. Elberta Fortis for Afib. PPM was also near ERI and she was amenable for generator change. She declined AC due to <1% burden of Afib on PPM.  Today, she says she is doing well over all. She will be going for the generator exchange on Jan 19. She does have some LE edema but that is normal for her and responds well to prn HCTZ. 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 No anginal symptoms.  She denies any palpitations, chest pain, shortness  of breath. No lightheadedness, headaches, syncope, orthopnea, or PND.   Past Medical History:  Diagnosis Date   Anxiety    Atrophic vaginitis    Chest pain    a. Adenosine sestamibi (1/14) with no evidence of ischemia or infarction, EF 63%.    Colon polyps    Complete heart block (HCC) 06-2013   status post pacemaker implantation by Dr Johney Frame 06-11-2013   Depression    DI (detrusor instability)    GERD (gastroesophageal reflux disease)    History + Hyplori via EGD   H/O echocardiogram    a. Echo 11/2012:EF 50-55%, focal basal septal hypertrophy with some mitral valve SAM (mild) and MR.  This may be a sigmoid septum with advanced age or could be a variant of hypertrophic cardiomyopathy.   HOCM (hypertrophic obstructive cardiomyopathy) (HCC)    Echocardiogram 2/22: EF 60-65, no RWMA, severe asymmetric septal LVH, no LVOT, Gr 1 DD, mild SAM, mod MR, mild LAE, mod to severe TR, Asc Ao 42 mm, normal RVSF, RVSP 28   HTN (hypertension)    Hyperlipidemia    LBBB (left bundle branch block)    Leukopenia 05/19/2014   Lymphocytosis 05/19/2014   Migraines    Myoview    Myoview 12/21: EF 69, no ischemia or infarction; low risk    Osteoporosis    Paroxysmal atrial fibrillation (HCC)  Prediabetes    Thoracic aortic aneurysm (HCC)    Chest CTA 2/22: Ascending thoracic aortic aneurysm 44 mm; Ao arch 34 mm>>repeat 1 year   Vitamin D deficiency     Past Surgical History:  Procedure Laterality Date   BREAST BIOPSY Left 07/25/2019   ANGIOLIPOMA   BREAST EXCISIONAL BIOPSY Right    BREAST SURGERY     Breast cyst   CATARACT EXTRACTION Left 2019   Dr. Dione Booze   HEMORRHOID SURGERY     LEFT HEART CATHETERIZATION WITH CORONARY ANGIOGRAM N/A 06/11/2013   Procedure: LEFT HEART CATHETERIZATION WITH CORONARY ANGIOGRAM;  Surgeon: Kathleene Hazel, MD;  Location: [facility] CATH LAB;  Service: Cardiovascular;  Laterality: N/A;   PACEMAKER INSERTION  06-11-2013   STJ dual chamber pacemaker implanted by Dr Johney Frame  for complete heart block   PERMANENT PACEMAKER INSERTION Left 06/11/2013   Procedure: PERMANENT PACEMAKER INSERTION;  Surgeon: Kathleene Hazel, MD;  Location: [facility] CATH LAB;  Service: Cardiovascular;  Laterality: Left;   PERMANENT PACEMAKER INSERTION N/A 06/11/2013   Procedure: PERMANENT PACEMAKER INSERTION;  Surgeon: Hillis Range, MD;  Location: [facility] CATH LAB;  Service: Cardiovascular;  Laterality: N/A;   TEMPORARY PACEMAKER INSERTION N/A 06/11/2013   Procedure: TEMPORARY PACEMAKER INSERTION;  Surgeon: Kathleene Hazel, MD;  Location: [facility] CATH LAB;  Service: Cardiovascular;  Laterality: N/A;   TUBAL LIGATION      Current Medications: Current Meds  Medication Sig   acetaminophen (TYLENOL) 500 MG tablet Take 1,000 mg by mouth in the morning and at bedtime.   Alcohol Swabs (B-D SINGLE USE SWABS REGULAR) PADS Use as directed for Glucose monitoring Daily   ALPRAZolam (XANAX) 0.5 MG tablet Take  1/2 - 1 tablet  Daily  ONLY  if needed for  Sleep or Tremor  &  limit to 5 days /week to avoid Addiction & Dementia   Ascorbic Acid (VITAMIN C PO) Take 1 tablet by mouth daily.    Calcium Carbonate-Vitamin D (CALCIUM + D PO) Take 1 tablet by mouth daily.    Cholecalciferol (VITAMIN D PO) Take 5,000 Units by mouth 2 (two) times a day.    citalopram (CELEXA) 20 MG tablet TAKE 1 TABLET EVERY DAY FOR MOOD   FOLIC ACID PO Take 1 tablet by mouth daily.    hydrochlorothiazide (HYDRODIURIL) 25 MG tablet Take 25 mg by mouth daily as needed (swelling).    Hydrocortisone Acetate 1 % OINT Apply to Hemorrhoid 3 to 4  x /day   MAGNESIUM PO Take 500 mg by mouth daily.    metoprolol (TOPROL-XL) 200 MG 24 hr tablet TAKE 1 TABLET EVERY DAY FOR BLOOD PRESSURE AND HEART   Multiple Vitamin (MULTIVITAMIN) tablet Take 1 tablet by mouth daily.   Multiple Vitamins-Minerals (ZINC PO) Take 200 mg by mouth daily.   pantoprazole (PROTONIX) 40 MG tablet Take 1 tablet Daily with Supper for Indigestion & Acid Reflux   tretinoin  (RETIN-A) 0.025 % cream Please specify directions, refills and quantity     Allergies:   Levsin [hyoscyamine sulfate], Mobic [meloxicam], Topamax [topiramate], Zoloft [sertraline hcl], Actonel [risedronate sodium], Boniva [ibandronic acid], Evista [raloxifene], and Fosamax [alendronate sodium]   Social History   Socioeconomic History   Marital status: Widowed    Spouse name: Not on file   Number of children: Not on file   Years of education: Not on file   Highest education level: Not on file  Occupational History   Occupation: Part-time at [employer]  Tobacco Use   Smoking  status: Former    Types: Cigarettes    Quit date: 10/09/1975    Years since quitting: 47.1   Smokeless tobacco: Never  Vaping Use   Vaping Use: Never used  Substance and Sexual Activity   Alcohol use: No   Drug use: No   Sexual activity: Never    Birth control/protection: Surgical, Post-menopausal  Other Topics Concern   Not on file  Social History Narrative   Lives alone. Dtr in the area.   Social Determinants of Health   Financial Resource Strain: Not on file  Food Insecurity: Not on file  Transportation Needs: Not on file  Physical Activity: Not on file  Stress: Not on file  Social Connections: Not on file     Family History: The patient's family history includes Aneurysm in her brother; Breast cancer (age of onset: 30) in her sister; Cancer in her father; Diabetes in her brother, brother, and mother; Heart disease in her brother, brother, and mother; Hypertension in her brother; Stroke in her brother.  ROS:   Please see the history of present illness.    Review of Systems  Constitutional:  Negative for chills and fever.  HENT:  Negative for hearing loss.   Eyes:  Negative for blurred vision and redness.  Respiratory:  Negative for shortness of breath.   Cardiovascular:  Positive for leg swelling. Negative for chest pain, palpitations, orthopnea, claudication and PND.  Gastrointestinal:  Negative  for abdominal pain and melena.  Genitourinary:  Negative for dysuria and flank pain.  Musculoskeletal:  Negative for falls, joint pain and myalgias.  Neurological:  Positive for tremors (worse than normal for her). Negative for dizziness and loss of consciousness.  Endo/Heme/Allergies:  Negative for polydipsia.  Psychiatric/Behavioral:  Negative for substance abuse.     EKGs/Labs/Other Studies Reviewed:    The following studies were reviewed today:  TTE 05/2022: IMPRESSIONS     1. Left ventricular ejection fraction, by estimation, is 60 to 65%. The  left ventricle has normal function. The left ventricle has no regional  wall motion abnormalities. There is moderate asymmetric left ventricular  hypertrophy of the basal-septal  segment. Left ventricular diastolic parameters are consistent with Grade I  diastolic dysfunction (impaired relaxation). The average left ventricular  global longitudinal strain is -17.2 % (borderline)   2. Right ventricular systolic function is normal. The right ventricular  size is not well visualized. There is mildly elevated pulmonary artery  systolic pressure.   3. The mitral valve is grossly normal. Mild mitral valve regurgitation.   4. The aortic valve is tricuspid. Aortic valve regurgitation is trivial.  No aortic stenosis is present.   5. Aortic ascending aortic aneurysm (45 mm).   6. Left atrial size was mildly dilated.   7. The inferior vena cava is normal in size with greater than 50%  respiratory variability, suggesting right atrial pressure of 3 mmHg.   Conclusion(s)/Recommendation(s): Compared to prior study, MR has improved.   TTE 12/29/20: 1. Left ventricular ejection fraction, by estimation, is 60 to 65%. The  left ventricle has normal function. The left ventricle demonstrates  regional wall motion abnormalities with septal-lateral dyssynchrony. There  is severe asymmetric basal to mid  septal left ventricular hypertrophy. No LV outflow  gradient. Left  ventricular diastolic parameters are consistent with Grade I diastolic  dysfunction (impaired relaxation).   2. The mitral valve shows mild systolic anterior motion. Moderate mitral  valve regurgitation. No evidence of mitral stenosis.   3. Left atrial size  was mildly dilated.   4. Tricuspid valve regurgitation is moderate to severe.   5. The aortic valve is tricuspid. Aortic valve regurgitation is trivial.  No aortic stenosis is present.   6. Aortic dilatation noted. There is mild dilatation of the ascending  aorta, measuring 42 mm.   7. Right ventricular systolic function is normal. The right ventricular  size is mildly enlarged. There is normal pulmonary artery systolic  pressure. The estimated right ventricular systolic pressure is 28.0 mmHg.   8. The inferior vena cava is normal in size with greater than 50%  respiratory variability, suggesting right atrial pressure of 3 mmHg.   9. This echo is consistent with hypertrophic cardiomyopathy.  Echocardiogram 03/10/2017 Severe focal basal septal hypertrophy, moderate hypertrophy of the myocardium, EF 60-65, normal wall motion, GR 1 DD, mild AI, trivial MR, normal RVSF, moderate TR, PASP 38   ETT 09/2015 Normal BP response, PVCs, no NSVT; low risk study   Echocardiogram 07/31/2015 Moderate asymmetric septal hypertrophy, EF 55-60, normal wall motion, G1 DD, mild AI, moderate S.A.M., moderate MR, moderate TR, peak LVOT gradient 9 mmHg   Cardiac catheterization 06/11/2013 LM ostial 10 LAD mid mild irregularities EF 50-55   Myoview 11/26/2012 EF 63, no ischemia  EKG: EKG is personally reviewed.  11/03/2022: No new tracing   Recent Labs: 01/17/2022: Magnesium 2.4; TSH 0.65 08/29/2022: ALT 12; BUN 14; Creat 0.75; Hemoglobin 13.3; Platelets 201; Potassium 4.4; Sodium 140  Recent Lipid Panel    Component Value Date/Time   CHOL 195 08/29/2022 1608   TRIG 121 08/29/2022 1608   HDL 55 08/29/2022 1608   CHOLHDL 3.5  08/29/2022 1608   VLDL 20 04/11/2017 1025   LDLCALC 117 (H) 08/29/2022 1608     Risk Assessment/Calculations:    CHA2DS2-VASc Score = 5  This indicates a 7.2% annual risk of stroke. The patient's score is based upon: CHF History: 0 HTN History: 1 Diabetes History: 0 Stroke History: 0 Vascular Disease History: 1 Age Score: 2 Gender Score: 1     Physical Exam:    VS:  BP 130/82   Pulse (!) 55   Ht 5\' 5"  (1.651 m)   Wt 168 lb 6.4 oz (76.4 kg)   SpO2 (!) 6%   BMI 28.02 kg/m     Wt Readings from Last 3 Encounters:  11/03/22 168 lb 6.4 oz (76.4 kg)  09/16/22 167 lb (75.8 kg)  08/29/22 167 lb 6.4 oz (75.9 kg)     GEN:  Comfortable, NAD HEENT: Normal NECK: No JVD; No carotid bruits CARDIAC: RRR, 2/6 mid-peaking systolic murmur. No rubs or gallops RESPIRATORY:  Clear to auscultation without rales, wheezing or rhonchi  ABDOMEN: Soft, non-tender, non-distended MUSCULOSKELETAL:  No edema; No deformity  SKIN: Warm and dry NEUROLOGIC:  Alert and oriented x 3 PSYCHIATRIC:  Normal affect   ASSESSMENT:    1. Hypertrophic obstructive cardiomyopathy (HCC)   2. Paroxysmal atrial fibrillation (HCC)   3. Dilation of aorta (HCC)   4. CHB (complete heart block) (HCC)   5. Primary hypertension   6. Cardiac pacemaker in situ   7. Ascending aorta dilatation (HCC)    PLAN:    In order of problems listed above:  #HCM: No LVOT obstruction on TTE. Low risk ETT in 2016. No MRI due to PPM. Episode of syncope in 2022 appeared to be secondary to taking extra doses of HCTZ with resultant dehydration. No recurrence of symptoms. TTE 12/2020 with LVEF 60-65%, no LVOT obstruction, mild SAM with  moderate MR. Repeat TTE 05/2022 with EF 60-65%, no LVOTO, mild MR, ascending aortic aneurysm 34mm. -Continue metop 200mg  XL daily -ETT low risk -No arrhythmias on cardiac monitor -Maintain adequate hydration -Only using HCTZ as needed for LE edema  #Chest Pain: Resolved. Cath 2014 with minimal  plaque. Exercise myoview negative for ischemia and normal EF.  TTE 05/2022 with EF 60-65%, moderate basal septal hypertrophy, G1DD, no LVOTO, mild MR, ascending aortic aneurysm 57mm.  #Complete Heart Block: S/p PPM placement. -Follow-up with EP as scheduled   #Paroxysmal Afib: <1% burden on PPM. Has declined AC. Follows with Dr. 48m.  #Moderate MR with mild SAM>>Mild MR: Improved on most recent TTE,. Will continue with monitoring. -Continue to monitor  #Moderate-to-severe Tricuspid Regurgitation>Mild: Mild on most recent TTE  #HTN: -Continue metop 200mg  XL daily  #HLD: Intolerance to statins. Has not started zetia and wishes to continue with lifestyle alone.  #Aortic Dilation: Mild on CTA 12/2021 measuring 68mm.  -Continue annual monitoring   Follow up: 6 months   Medication Adjustments/Labs and Tests Ordered: Current medicines are reviewed at length with the patient today.  Concerns regarding medicines are outlined above.  No orders of the defined types were placed in this encounter.  No orders of the defined types were placed in this encounter.   Patient Instructions  Medication Instructions:  Your physician recommends that you continue on your current medications as directed. Please refer to the Current Medication list given to you today.  *If you need a refill on your cardiac medications before your next appointment, please call your pharmacy*   Lab Work: BMET and CBC 1/15 If you have labs (blood work) drawn today and your tests are completely normal, you will receive your results only by: MyChart Message (if you have MyChart) OR A paper copy in the mail If you have any lab test that is abnormal or we need to change your treatment, we will call you to review the results.   Follow-Up: At [facility], you and your health needs are our priority.  As part of our continuing mission to provide you with exceptional heart care, we have created designated  Provider Care Teams.  These Care Teams include your primary Cardiologist (physician) and Advanced Practice Providers (APPs -  Physician Assistants and Nurse Practitioners) who all work together to provide you with the care you need, when you need it.  We recommend signing up for the patient portal called "MyChart".  Sign up information is provided on this After Visit Summary.  MyChart is used to connect with patients for Virtual Visits (Telemedicine).  Patients are able to view lab/test results, encounter notes, upcoming appointments, etc.  Non-urgent messages can be sent to your provider as well.   To learn more about what you can do with MyChart, go to 2/15.    Your next appointment:   6 month(s)  The format for your next appointment:   In Person  Provider:   [facility], MD     Important Information About Sugar           [email or web address],acting as a scribe for Meriam Sprague, MD.,have documented all relevant documentation on the behalf of Jodelle Gross, MD,as directed by  Meriam Sprague, MD while in the presence of Meriam Sprague, MD.   I, Meriam Sprague, MD, have reviewed all documentation for this visit. The documentation on 11/03/22 for the exam, diagnosis, procedures, and orders are all accurate and complete.  Signed, Meriam SpragueHeather E Velvet Moomaw, MD  11/03/2022 9:20 AM    [facility]

## 2022-11-03 NOTE — Telephone Encounter (Signed)
  Pt said, she missed a call from from [facility]. She returning her call

## 2022-11-03 NOTE — Patient Instructions (Signed)
Medication Instructions:  Your physician recommends that you continue on your current medications as directed. Please refer to the Current Medication list given to you today.  *If you need a refill on your cardiac medications before your next appointment, please call your pharmacy*   Lab Work: BMET and CBC 1/15 If you have labs (blood work) drawn today and your tests are completely normal, you will receive your results only by: MyChart Message (if you have MyChart) OR A paper copy in the mail If you have any lab test that is abnormal or we need to change your treatment, we will call you to review the results.   Follow-Up: At [facility], you and your health needs are our priority.  As part of our continuing mission to provide you with exceptional heart care, we have created designated Provider Care Teams.  These Care Teams include your primary Cardiologist (physician) and Advanced Practice Providers (APPs -  Physician Assistants and Nurse Practitioners) who all work together to provide you with the care you need, when you need it.  We recommend signing up for the patient portal called "MyChart".  Sign up information is provided on this After Visit Summary.  MyChart is used to connect with patients for Virtual Visits (Telemedicine).  Patients are able to view lab/test results, encounter notes, upcoming appointments, etc.  Non-urgent messages can be sent to your provider as well.   To learn more about what you can do with MyChart, go to [email or web address].    Your next appointment:   6 month(s)  The format for your next appointment:   In Person  Provider:   Meriam Sprague, MD     Important Information About Sugar

## 2022-11-03 NOTE — Telephone Encounter (Signed)
Patient inquired if Dr Shari Prows wants her to take aspirin 81 mg daily. Advised patient per Dr Shari Prows that while she doesn't need to take a daily aspirin it is ok if she does. Patient verbalized understanding.

## 2022-11-04 NOTE — Progress Notes (Signed)
Remote pacemaker transmission.   

## 2022-11-14 ENCOUNTER — Ambulatory Visit (INDEPENDENT_AMBULATORY_CARE_PROVIDER_SITE_OTHER): Payer: Medicare PPO

## 2022-11-14 DIAGNOSIS — I442 Atrioventricular block, complete: Secondary | ICD-10-CM | POA: Diagnosis not present

## 2022-11-15 LAB — CUP PACEART REMOTE DEVICE CHECK
Battery Remaining Longevity: 0 mo
Battery Voltage: 2.57 V
Brady Statistic AP VP Percent: 92 %
Brady Statistic AP VS Percent: 1 %
Brady Statistic AS VP Percent: 7.4 %
Brady Statistic AS VS Percent: 1 %
Brady Statistic RA Percent Paced: 92 %
Brady Statistic RV Percent Paced: 99 %
Date Time Interrogation Session: 20240107020020
Implantable Lead Connection Status: 753985
Implantable Lead Connection Status: 753985
Implantable Lead Implant Date: 20140805
Implantable Lead Implant Date: 20140805
Implantable Lead Location: 753859
Implantable Lead Location: 753860
Implantable Lead Model: 1944
Implantable Lead Model: 1948
Implantable Pulse Generator Implant Date: 20140805
Lead Channel Impedance Value: 460 Ohm
Lead Channel Impedance Value: 580 Ohm
Lead Channel Pacing Threshold Amplitude: 0.75 V
Lead Channel Pacing Threshold Amplitude: 1.5 V
Lead Channel Pacing Threshold Pulse Width: 0.4 ms
Lead Channel Pacing Threshold Pulse Width: 0.8 ms
Lead Channel Sensing Intrinsic Amplitude: 1.9 mV
Lead Channel Sensing Intrinsic Amplitude: 12 mV
Lead Channel Setting Pacing Amplitude: 1 V
Lead Channel Setting Pacing Amplitude: 3 V
Lead Channel Setting Pacing Pulse Width: 0.4 ms
Lead Channel Setting Sensing Sensitivity: 4 mV
Pulse Gen Model: 2240
Pulse Gen Serial Number: 7523768

## 2022-11-21 ENCOUNTER — Ambulatory Visit: Payer: Medicare PPO | Attending: Cardiology

## 2022-11-21 DIAGNOSIS — I442 Atrioventricular block, complete: Secondary | ICD-10-CM | POA: Diagnosis not present

## 2022-11-21 LAB — CBC
Hematocrit: 38.7 % (ref 34.0–46.6)
Hemoglobin: 12.9 g/dL (ref 11.1–15.9)
MCH: 28.4 pg (ref 26.6–33.0)
MCHC: 33.3 g/dL (ref 31.5–35.7)
MCV: 85 fL (ref 79–97)
Platelets: 216 10*3/uL (ref 150–450)
RBC: 4.54 x10E6/uL (ref 3.77–5.28)
RDW: 13.6 % (ref 11.7–15.4)
WBC: 4.3 10*3/uL (ref 3.4–10.8)

## 2022-11-21 LAB — BASIC METABOLIC PANEL
BUN/Creatinine Ratio: 15 (ref 12–28)
BUN: 14 mg/dL (ref 8–27)
CO2: 27 mmol/L (ref 20–29)
Calcium: 9.6 mg/dL (ref 8.7–10.3)
Chloride: 103 mmol/L (ref 96–106)
Creatinine, Ser: 0.92 mg/dL (ref 0.57–1.00)
Glucose: 97 mg/dL (ref 70–99)
Potassium: 5.1 mmol/L (ref 3.5–5.2)
Sodium: 137 mmol/L (ref 134–144)
eGFR: 62 mL/min/{1.73_m2} (ref >59)

## 2022-11-24 NOTE — Pre-Procedure Instructions (Signed)
Instructed patient on the following items: Arrival time 1400 Nothing to eat or drink after midnight No meds AM of procedure Responsible person to drive you home and stay with you for 24 hrs Wash with special soap night before and morning of procedure  

## 2022-11-25 ENCOUNTER — Other Ambulatory Visit: Payer: Self-pay

## 2022-11-25 ENCOUNTER — Encounter (HOSPITAL_COMMUNITY)
Admission: RE | Disposition: A | Discharge status: Home / Self Care | Payer: Self-pay | Source: Home / Self Care | Attending: Cardiology

## 2022-11-25 ENCOUNTER — Ambulatory Visit (HOSPITAL_COMMUNITY)
Admission: RE | Admit: 2022-11-25 | Discharge: 2022-11-25 | Disposition: A | Discharge status: Home / Self Care | Payer: Medicare PPO | Attending: Cardiology | Admitting: Cardiology

## 2022-11-25 DIAGNOSIS — I442 Atrioventricular block, complete: Secondary | ICD-10-CM | POA: Insufficient documentation

## 2022-11-25 DIAGNOSIS — Z87891 Personal history of nicotine dependence: Secondary | ICD-10-CM | POA: Insufficient documentation

## 2022-11-25 DIAGNOSIS — Z45010 Encounter for checking and testing of cardiac pacemaker pulse generator [battery]: Secondary | ICD-10-CM | POA: Insufficient documentation

## 2022-11-25 DIAGNOSIS — E785 Hyperlipidemia, unspecified: Secondary | ICD-10-CM | POA: Insufficient documentation

## 2022-11-25 DIAGNOSIS — I422 Other hypertrophic cardiomyopathy: Secondary | ICD-10-CM | POA: Diagnosis not present

## 2022-11-25 DIAGNOSIS — I10 Essential (primary) hypertension: Secondary | ICD-10-CM | POA: Insufficient documentation

## 2022-11-25 DIAGNOSIS — R001 Bradycardia, unspecified: Secondary | ICD-10-CM | POA: Insufficient documentation

## 2022-11-25 HISTORY — PX: PPM GENERATOR CHANGEOUT: EP1233

## 2022-11-25 SURGERY — PPM GENERATOR CHANGEOUT
Anesthesia: LOCAL

## 2022-11-25 MED ORDER — LIDOCAINE HCL (PF) 1 % IJ SOLN
INTRAMUSCULAR | Status: AC
  Filled 2022-11-25: qty 60

## 2022-11-25 MED ORDER — SODIUM CHLORIDE 0.9 % IV SOLN
INTRAVENOUS | Status: AC
  Filled 2022-11-25: qty 2

## 2022-11-25 MED ORDER — ACETAMINOPHEN 325 MG PO TABS: 325.0000 mg | ORAL_TABLET | ORAL | Status: DC | PRN

## 2022-11-25 MED ORDER — SODIUM CHLORIDE 0.9 % IV SOLN: INTRAVENOUS | Status: DC

## 2022-11-25 MED ORDER — POVIDONE-IODINE 10 % EX SWAB
2.0000 | Freq: Once | CUTANEOUS | Status: AC
  Administered 2022-11-25: 2 via TOPICAL

## 2022-11-25 MED ORDER — CEFAZOLIN SODIUM-DEXTROSE 2-4 GM/100ML-% IV SOLN
2.0000 g | INTRAVENOUS | Status: AC
  Administered 2022-11-25: 2 g via INTRAVENOUS

## 2022-11-25 MED ORDER — ONDANSETRON HCL 4 MG/2ML IJ SOLN: 4.0000 mg | Freq: Four times a day (QID) | INTRAMUSCULAR | Status: DC | PRN

## 2022-11-25 MED ORDER — CHLORHEXIDINE GLUCONATE 4 % EX LIQD: 4.0000 | Freq: Once | CUTANEOUS | Status: DC

## 2022-11-25 MED ORDER — CEFAZOLIN SODIUM-DEXTROSE 2-4 GM/100ML-% IV SOLN
INTRAVENOUS | Status: AC
  Filled 2022-11-25: qty 100

## 2022-11-25 MED ORDER — LIDOCAINE HCL (PF) 1 % IJ SOLN
INTRAMUSCULAR | Status: DC | PRN
  Administered 2022-11-25: 60 mL

## 2022-11-25 MED ORDER — SODIUM CHLORIDE 0.9 % IV SOLN
80.0000 mg | INTRAVENOUS | Status: AC
  Administered 2022-11-25: 80 mg

## 2022-11-25 SURGICAL SUPPLY — 6 items
CABLE SURGICAL S-101-97-12 (CABLE) ×1 IMPLANT
PACEMAKER ASSURITY DR-RF (Pacemaker) IMPLANT
PAD DEFIB RADIO PHYSIO CONN (PAD) ×1 IMPLANT
POUCH AIGIS-R ANTIBACT PPM (Mesh General) ×1 IMPLANT
POUCH AIGIS-R ANTIBACT PPM MED (Mesh General) IMPLANT
TRAY PACEMAKER INSERTION (PACKS) ×1 IMPLANT

## 2022-11-25 NOTE — Progress Notes (Signed)
Patient scratching at left side of neck where the orange color cleaner was use for the procedure. Dressing removed using sterile glovers, and cleaned with alcohol pads. Redressed with a new Tegaderm +Pad dressing. Patient itching stopped.

## 2022-11-25 NOTE — Discharge Instructions (Signed)
Implantable Cardiac Device Battery Change, Care After  This sheet gives you information about how to care for yourself after your procedure. Your health care provider may also give you more specific instructions. If you have problems or questions, contact your health care provider. What can I expect after the procedure? After your procedure, it is common to have:  Pain or soreness at the site where the cardiac device was inserted.  Swelling at the site where the cardiac device was inserted.  You should received an information card for your new device in 4-8 weeks. Follow these instructions at home: Incision care   Keep the incision clean and dry. ? Do not take baths, swim, or use a hot tub until after your wound check.  ? Do not shower for at least 7 days, or as directed by your health care provider. ? Pat the area dry with a clean towel. Do not rub the area. This may cause bleeding.  Follow instructions from your health care provider about how to take care of your incision. Make sure you: ? Leave stitches (sutures), skin glue, or adhesive strips in place. These skin closures may need to stay in place for 2 weeks or longer. If adhesive strip edges start to loosen and curl up, you may trim the loose edges. Do not remove adhesive strips completely unless your health care provider tells you to do that.  Check your incision area every day for signs of infection. Check for: ? More redness, swelling, or pain. ? More fluid or blood. ? Warmth. ? Pus or a bad smell. Activity  Do not lift anything that is heavier than 10 lb (4.5 kg) until your health care provider says it is okay to do so.  For the first week, or as long as told by your health care provider: ? Avoid lifting your affected arm higher than your shoulder. ? After 1 week, Be gentle when you move your arms over your head. It is okay to raise your arm to comb your hair. ? Avoid strenuous exercise.  Ask your health care provider  when it is okay to: ? Resume your normal activities. ? Return to work or school. ? Resume sexual activity. Eating and drinking  Eat a heart-healthy diet. This should include plenty of fresh fruits and vegetables, whole grains, low-fat dairy products, and lean protein like chicken and fish.  Limit alcohol intake to no more than 1 drink a day for non-pregnant women and 2 drinks a day for men. One drink equals 12 oz of beer, 5 oz of wine, or 1 oz of hard liquor.  Check ingredients and nutrition facts on packaged foods and beverages. Avoid the following types of food: ? Food that is high in salt (sodium). ? Food that is high in saturated fat, like full-fat dairy or red meat. ? Food that is high in trans fat, like fried food. ? Food and drinks that are high in sugar. Lifestyle  Do not use any products that contain nicotine or tobacco, such as cigarettes and e-cigarettes. If you need help quitting, ask your health care provider.  Take steps to manage and control your weight.  Once cleared, get regular exercise. Aim for 150 minutes of moderate-intensity exercise (such as walking or yoga) or 75 minutes of vigorous exercise (such as running or swimming) each week.  Manage other health problems, such as diabetes or high blood pressure. Ask your health care provider how you can manage these conditions. General instructions  Do   not drive for 24 hours after your procedure if you were given a medicine to help you relax (sedative).  Take over-the-counter and prescription medicines only as told by your health care provider.  Avoid putting pressure on the area where the cardiac device was placed.  If you need an MRI after your cardiac device has been placed, be sure to tell the health care provider who orders the MRI that you have a cardiac device.  Avoid close and prolonged exposure to electrical devices that have strong magnetic fields. These include: ? Cell phones. Avoid keeping them in a  pocket near the cardiac device, and try using the ear opposite the cardiac device. ? MP3 players. ? Household appliances, like microwaves. ? Metal detectors. ? Electric generators. ? High-tension wires.  Keep all follow-up visits as directed by your health care provider. This is important. Contact a health care provider if:  You have pain at the incision site that is not relieved by over-the-counter or prescription medicines.  You have any of these around your incision site or coming from it: ? More redness, swelling, or pain. ? Fluid or blood. ? Warmth to the touch. ? Pus or a bad smell.  You have a fever.  You feel brief, occasional palpitations, light-headedness, or any symptoms that you think might be related to your heart. Get help right away if:  You experience chest pain that is different from the pain at the cardiac device site.  You develop a red streak that extends above or below the incision site.  You experience shortness of breath.  You have palpitations or an irregular heartbeat.  You have light-headedness that does not go away quickly.  You faint or have dizzy spells.  Your pulse suddenly drops or increases rapidly and does not return to normal.  You begin to gain weight and your legs and ankles swell. Summary  After your procedure, it is common to have pain, soreness, and some swelling where the cardiac device was inserted.  Make sure to keep your incision clean and dry. Follow instructions from your health care provider about how to take care of your incision.  Check your incision every day for signs of infection, such as more pain or swelling, pus or a bad smell, warmth, or leaking fluid and blood.  Avoid strenuous exercise and lifting your left arm higher than your shoulder for 2 weeks, or as long as told by your health care provider. This information is not intended to replace advice given to you by your health care provider. Make sure you discuss  any questions you have with your health care provider.   

## 2022-11-25 NOTE — H&P (Signed)
Electrophysiology Office Note   Date:  11/25/2022   ID:  Alexandra Wheeler, Alexandra Wheeler 15-Feb-1941, MRN [id number]  PCP:  Lucky Cowboy, MD  Cardiologist:  Shari Prows Primary Electrophysiologist:  Vadhir Mcnay Jorja Loa, MD    Chief Complaint: AF   History of Present Illness: Alexandra Wheeler is a 82 y.o. female who is being seen today for the evaluation of AF at the request of No ref. provider found. Presenting today for electrophysiology evaluation.  She has a history significant for atrial fibrillation, complete heart block, hypertension, hyperlipidemia, hypertrophic cardiomyopathy.  She is status post [employer] Jude dual-chamber pacemaker implanted 06/11/2013.  Today, denies symptoms of palpitations, chest pain, shortness of breath, orthopnea, PND, lower extremity edema, claudication, dizziness, presyncope, syncope, bleeding, or neurologic sequela. The patient is tolerating medications without difficulties. Plan for generator change today.    Past Medical History:  Diagnosis Date   Anxiety    Atrophic vaginitis    Chest pain    a. Adenosine sestamibi (1/14) with no evidence of ischemia or infarction, EF 63%.    Colon polyps    Complete heart block (HCC) 06-2013   status post pacemaker implantation by Dr Johney Frame 06-11-2013   Depression    DI (detrusor instability)    GERD (gastroesophageal reflux disease)    History + Hyplori via EGD   H/O echocardiogram    a. Echo 11/2012:EF 50-55%, focal basal septal hypertrophy with some mitral valve SAM (mild) and MR.  This may be a sigmoid septum with advanced age or could be a variant of hypertrophic cardiomyopathy.   HOCM (hypertrophic obstructive cardiomyopathy) (HCC)    Echocardiogram 2/22: EF 60-65, no RWMA, severe asymmetric septal LVH, no LVOT, Gr 1 DD, mild SAM, mod MR, mild LAE, mod to severe TR, Asc Ao 42 mm, normal RVSF, RVSP 28   HTN (hypertension)    Hyperlipidemia    LBBB (left bundle branch block)    Leukopenia 05/19/2014    Lymphocytosis 05/19/2014   Migraines    Myoview    Myoview 12/21: EF 69, no ischemia or infarction; low risk    Osteoporosis    Paroxysmal atrial fibrillation (HCC)    Prediabetes    Thoracic aortic aneurysm (HCC)    Chest CTA 2/22: Ascending thoracic aortic aneurysm 44 mm; Ao arch 34 mm>>repeat 1 year   Vitamin D deficiency    Past Surgical History:  Procedure Laterality Date   BREAST BIOPSY Left 07/25/2019   ANGIOLIPOMA   BREAST EXCISIONAL BIOPSY Right    BREAST SURGERY     Breast cyst   CATARACT EXTRACTION Left 2019   Dr. Dione Booze   HEMORRHOID SURGERY     LEFT HEART CATHETERIZATION WITH CORONARY ANGIOGRAM N/A 06/11/2013   Procedure: LEFT HEART CATHETERIZATION WITH CORONARY ANGIOGRAM;  Surgeon: Kathleene Hazel, MD;  Location: [facility] CATH LAB;  Service: Cardiovascular;  Laterality: N/A;   PACEMAKER INSERTION  06-11-2013   STJ dual chamber pacemaker implanted by Dr Johney Frame for complete heart block   PERMANENT PACEMAKER INSERTION Left 06/11/2013   Procedure: PERMANENT PACEMAKER INSERTION;  Surgeon: Kathleene Hazel, MD;  Location[facility] C[facility] LAB;  Service: Cardiovascular;  Laterality: Left;   PERMANENT PACEMAKER INSERTION N/A 06/11/2013   Procedure: PERMANENT PACEMAKER INSERTION;  Surgeon: Hillis Range, MD;  Location: [facility] CATH LAB;  Service: Cardiovascular;  Laterality: N/A;   TEMPORARY PACEMAKER INSERTION N/A 06/11/2013   Procedure: TEMPORARY PACEMAKER INSERTION;  Surgeon: Kathleene Hazel, MD;  Location: [facility] CATH LAB;  Service: Cardiovascular;  Laterality: N/A;  TUBAL LIGATION       No current facility-administered medications for this encounter.   Current Outpatient Medications  Medication Sig Dispense Refill   acetaminophen (TYLENOL) 500 MG tablet Take 500 mg by mouth 2 (two) times daily.     ALPRAZolam (XANAX) 0.5 MG tablet Take  1/2 - 1 tablet  Daily  ONLY  if needed for  Sleep or Tremor  &  limit to 5 days /week to avoid Addiction & Dementia (Patient taking differently: Take  0.25 mg by mouth at bedtime as needed for anxiety or sleep.) 60 tablet 0   Ascorbic Acid (VITAMIN C PO) Take 1 tablet by mouth daily.      CALCIUM PO Take 1 tablet by mouth daily.     Cholecalciferol (VITAMIN D PO) Take 2 capsules by mouth daily.     citalopram (CELEXA) 20 MG tablet TAKE 1 TABLET EVERY DAY FOR MOOD (Patient taking differently: Take 20 mg by mouth at bedtime.) 90 tablet 3   FOLIC ACID PO Take 1 tablet by mouth daily.      Hydrocortisone Acetate 1 % OINT Apply to Hemorrhoid 3 to 4  x /day (Patient taking differently: Apply 1 Application topically 4 (four) times daily as needed (hemorrhoid).) 60 g 3   MAGNESIUM PO Take 1 tablet by mouth daily.     metoprolol (TOPROL-XL) 200 MG 24 hr tablet TAKE 1 TABLET EVERY DAY FOR BLOOD PRESSURE AND HEART 90 tablet 3   Multiple Vitamin (MULTIVITAMIN) tablet Take 1 tablet by mouth daily.     Multiple Vitamins-Minerals (ZINC PO) Take 1 tablet by mouth daily.     naproxen sodium (ALEVE) 220 MG tablet Take 220 mg by mouth daily as needed (pain).     pantoprazole (PROTONIX) 40 MG tablet Take 1 tablet Daily with Supper for Indigestion & Acid Reflux (Patient taking differently: Take 40 mg by mouth daily as needed (acid reflux).) 90 tablet 3   tretinoin (RETIN-A) 0.025 % cream Please specify directions, refills and quantity (Patient taking differently: Apply 1 Application topically daily as needed (wrinkles). Please specify directions, refills and quantity) 45 g 1   Alcohol Swabs (B-D SINGLE USE SWABS REGULAR) PADS Use as directed for Glucose monitoring Daily 100 each 3    Allergies:   Levsin [hyoscyamine sulfate], Mobic [meloxicam], Topamax [topiramate], Zoloft [sertraline hcl], Actonel [risedronate sodium], Boniva [ibandronic acid], Evista [raloxifene], and Fosamax [alendronate sodium]   Social History:  The patient  reports that she quit smoking about 47 years ago. Her smoking use included cigarettes. She has never used smokeless tobacco. She reports  that she does not drink alcohol and does not use drugs.   Family History:  The patient's family history includes Aneurysm in her brother; Breast cancer (age of onset: 30) in her sister; Cancer in her father; Diabetes in her brother, brother, and mother; Heart disease in her brother, brother, and mother; Hypertension in her brother; Stroke in her brother.   ROS:  Please see the history of present illness.   Otherwise, review of systems is positive for none.   All other systems are reviewed and negative.   PHYSICAL EXAM: VS:  There were no vitals taken for this visit. , BMI There is no height or weight on file to calculate BMI. GEN: Well nourished, well developed, in no acute distress  HEENT: normal  Neck: no JVD, carotid bruits, or masses Cardiac: RRR; no murmurs, rubs, or gallops,no edema  Respiratory:  clear to auscultation bilaterally, normal work of  breathing GI: soft, nontender, nondistended, + BS MS: no deformity or atrophy  Skin: warm and dry Neuro:  Strength and sensation are intact Psych: euthymic mood, full affect   Recent Labs: 01/17/2022: Magnesium 2.4; TSH 0.65 08/29/2022: ALT 12 11/21/2022: BUN 14; Creatinine, Ser 0.92; Hemoglobin 12.9; Platelets 216; Potassium 5.1; Sodium 137    Lipid Panel     Component Value Date/Time   CHOL 195 08/29/2022 1608   TRIG 121 08/29/2022 1608   HDL 55 08/29/2022 1608   CHOLHDL 3.5 08/29/2022 1608   VLDL 20 04/11/2017 1025   LDLCALC 117 (H) 08/29/2022 1608     Wt Readings from Last 3 Encounters:  11/03/22 76.4 kg  09/16/22 75.8 kg  08/29/22 75.9 kg      Other studies Reviewed: Additional studies/ records that were reviewed today include: TTE 05/19/22  Review of the above records today demonstrates:   1. Left ventricular ejection fraction, by estimation, is 60 to 65%. The  left ventricle has normal function. The left ventricle has no regional  wall motion abnormalities. There is moderate asymmetric left ventricular   hypertrophy of the basal-septal  segment. Left ventricular diastolic parameters are consistent with Grade I  diastolic dysfunction (impaired relaxation). The average left ventricular  global longitudinal strain is -17.2 % (borderline)   2. Right ventricular systolic function is normal. The right ventricular  size is not well visualized. There is mildly elevated pulmonary artery  systolic pressure.   3. The mitral valve is grossly normal. Mild mitral valve regurgitation.   4. The aortic valve is tricuspid. Aortic valve regurgitation is trivial.  No aortic stenosis is present.   5. Aortic ascending aortic aneurysm (45 mm).   6. Left atrial size was mildly dilated.   7. The inferior vena cava is normal in size with greater than 50%  respiratory variability, suggesting right atrial pressure of 3 mmHg.    ASSESSMENT AND PLAN:  1.  Complete heart block:  Alexandra Wheeler has presented today for surgery, with the diagnosis of heart block.  The various methods of treatment have been discussed with the patient and family. After consideration of risks, benefits and other options for treatment, the patient has consented to  Procedure(s): Pacemaker generator change as a surgical intervention .  Risks include but not limited to bleeding, infection, pneumothorax, perforation, tamponade, vascular damage, renal failure, MI, stroke, death, and lead dislodgement . The patient's history has been reviewed, patient examined, no change in status, stable for surgery.  I have reviewed the patient's chart and labs.  Questions were answered to the patient's satisfaction.    Alexandra Rayborn Curt Bears, MD 11/25/2022 7:36 AM

## 2022-11-28 ENCOUNTER — Encounter (HOSPITAL_COMMUNITY): Payer: Self-pay | Admitting: Cardiology

## 2022-12-07 NOTE — Progress Notes (Unsigned)
Cardiology Office Note Date:  04/18/2022  Patient ID:  [name], [name] [birth year], MRN [id number] PCP:  Unk Pinto, MD  Cardiologist:  Dr. Aundra Dubin >> Dr. Johney Frame Electrophysiologist: Dr. Rayann Heman >> Dr. Curt Bears    Chief Complaint:  *** wound check  History of Present Illness: Alexandra Wheeler is a 82 y.o. female with history of Afib, CHB w/PPM, HTN, HLD, HCM.  She comes in today to be seen for Dr. Jonathon Bellows seen by him Feb 2020.  At that time doing well, AF burden <1%, no changes were made.  Discussed possibility of stopping a/c if burden remained low at her next visit   I saw her 12/17/2019 She is doing well.  Denies any CP, palpitations or cardiac awareness, no near syncope or syncope, no SOB She is not exercising but reports she stays moving and busy, recently moved, was very active packing, unpacking, cleaning without exertional intolerances AF burden was ,1% No changes were made  Most recently she saw Dr. Johney Frame 01/25/21, she was doing quite well, discussed recent issue with syncope felt 2/2 HCTZ and dehydration, mentioned Repeat TTE with LVEF 60-65%, no LVOT obstruction, mild SAM with moderate MR. She was exercising, no changes were made, planned to monitor serial echos.  I saw her Nov 2022 She is doing well, turns 31 tomorrow. All in all she is doing OK She wishes she had more energy, feels tired most of the time Sometimes feels like she is a little lightheaded (like now with good BP and HR/rhythm), is a sense of just feeling "off" Once some months ago felt more clearly lightheaded and layed down in bed, felt like something just came over her and she was weak, did not faint and eventually just felt better She has not with these symptoms or otherwise had CP, palpitations or cardiac awareness. She has felt this way probably since she turned 37 a year now, suspects she is just aging. She rarely uses the HCTZ, can not recall the last time she used  it She had concerns of her fatigue and worries given brother died of cancer, urged to follow up with her PMD Labs were done Very low AF burden, discussed restarting A/C vs monitoring, planned to monitor burden RA lead threshold was up some and programmed to maintain 2:1 margin   I saw her March 2023 No changes. Still wishes she had more energy, though does her ADLs at a pace, able to get her things done No CP, palpitations or SOB Her PMD has changed her metoprolol to Nadolol for better management of her tremor She has not yet gotten the nadolol, worries abut her BP No dizzy spells, near syncope or syncope. Discussed her afib burden, 1.3%, she preferred to hold of on resuming a/c. Preferred to establish with a new EP, and planned to see her back in 3 mo with me to follow AF burden and 50mo with Dr. Curt Bears with her battery about 14mo to ERI  I saw her 04/18/22 She continues to do well, has a trip to [place] planned end of October, asks about her pacer/battery and if she can go. Denies any cardiac awareness or symptoms. No CP, palpitations No SOB No near syncope or syncope. Burden <1% and remained off a/c Nearing ERI, no changes were made Planned to establish with Dr. Curt Bears  She saw Dr. Curt Bears 08/19/22. AF burden <1% with plans for ongoing burden monitoring, pt preference to stay off a/c.  Discussed gen change procedure.  *** site ***  no restrictions *** AF burden *** volume   Afib hx Diagnosed Nov 2019 AAD None to date  Device information SJM dual chamber PPM, implanted 06/11/2013 >> gen change 11/25/22  Past Medical History:  Diagnosis Date   Anxiety    Atrophic vaginitis    Chest pain    a. Adenosine sestamibi (1/14) with no evidence of ischemia or infarction, EF 63%.    Colon polyps    Complete heart block ([place]) 06-2013   status post pacemaker implantation by Dr Rayann Heman 06-11-2013   Depression    DI (detrusor instability)    GERD (gastroesophageal reflux disease)     History + Hyplori via EGD   H/O echocardiogram    a. Echo 11/2012:EF 50-55%, focal basal septal hypertrophy with some mitral valve SAM (mild) and MR.  This may be a sigmoid septum with advanced age or could be a variant of hypertrophic cardiomyopathy.   HOCM (hypertrophic obstructive cardiomyopathy) ([place])    Echocardiogram 2/22: EF 60-65, no RWMA, severe asymmetric septal LVH, no LVOT, Gr 1 DD, mild SAM, mod MR, mild LAE, mod to severe TR, Asc Ao 42 mm, normal RVSF, RVSP 28   HTN (hypertension)    Hyperlipidemia    LBBB (left bundle branch block)    Leukopenia 05/19/2014   Lymphocytosis 05/19/2014   Migraines    Myoview    Myoview 12/21: EF 69, no ischemia or infarction; low risk    Osteoporosis    Paroxysmal atrial fibrillation ([place])    Prediabetes    Thoracic aortic aneurysm ([place])    Chest CTA 2/22: Ascending thoracic aortic aneurysm 44 mm; Ao arch 34 mm>>repeat 1 year   Vitamin D deficiency     Past Surgical History:  Procedure Laterality Date   BREAST BIOPSY Left 07/25/2019   ANGIOLIPOMA   BREAST EXCISIONAL BIOPSY Right    BREAST SURGERY     Breast cyst   CATARACT EXTRACTION Left 2019   Dr. Katy Fitch   HEMORRHOID SURGERY     LEFT HEART CATHETERIZATION WITH CORONARY ANGIOGRAM N/A 06/11/2013   Procedure: LEFT HEART CATHETERIZATION WITH CORONARY ANGIOGRAM;  Surgeon: Burnell Blanks, MD;  Location: [facility] CATH LAB;  Service: Cardiovascular;  Laterality: N/A;   PACEMAKER INSERTION  06-11-2013   STJ dual chamber pacemaker implanted by Dr Rayann Heman for complete heart block   PERMANENT PACEMAKER INSERTION Left 06/11/2013   Procedure: PERMANENT PACEMAKER INSERTION;  Surgeon: Burnell Blanks, MD;  Location: [facility] CATH LAB;  Service: Cardiovascular;  Laterality: Left;   PERMANENT PACEMAKER INSERTION N/A 06/11/2013   Procedure: PERMANENT PACEMAKER INSERTION;  Surgeon: Thompson Grayer, MD;  Location: [facility] CATH LAB;  Service: Cardiovascular;  Laterality: N/A;   TEMPORARY PACEMAKER INSERTION N/A 06/11/2013    Procedure: TEMPORARY PACEMAKER INSERTION;  Surgeon: Burnell Blanks, MD;  Location: [facility] CATH LAB;  Service: Cardiovascular;  Laterality: N/A;   TUBAL LIGATION      Current Outpatient Medications  Medication Sig Dispense Refill   acetaminophen (TYLENOL) 500 MG tablet Take 1,000 mg by mouth in the morning and at bedtime.     Alcohol Swabs (B-D SINGLE USE SWABS REGULAR) PADS Use as directed for Glucose monitoring Daily 100 each 3   ALPRAZolam (XANAX) 1 MG tablet TAKE 1/2 TO 1 TABLET AT BEDTIME ONLY IF NEEDED FOR SLEEP. TRY TO LIMIT TO 5 DAYS PER WEEK TO AVOID ADDICTION 20 tablet 0   Ascorbic Acid (VITAMIN C PO) Take 1 tablet by mouth daily.      Calcium Carbonate-Vitamin D (CALCIUM +  D PO) Take 1 tablet by mouth daily.      Cholecalciferol (VITAMIN D PO) Take 5,000 Units by mouth 2 (two) times a day.      citalopram (CELEXA) 20 MG tablet TAKE 1 TABLET EVERY DAY FOR MOOD 90 tablet 3   FOLIC ACID PO Take 1 tablet by mouth daily.      hydrochlorothiazide (HYDRODIURIL) 25 MG tablet Take 25 mg by mouth daily as needed (swelling).      Hydrocortisone Acetate 1 % OINT Apply to Hemorrhoid 3 to 4  x /day 60 g 3   MAGNESIUM PO Take 500 mg by mouth daily.      metoprolol (TOPROL-XL) 200 MG 24 hr tablet TAKE 1 TABLET EVERY DAY FOR BLOOD PRESSURE AND HEART 90 tablet 3   Multiple Vitamin (MULTIVITAMIN) tablet Take 1 tablet by mouth daily.     Multiple Vitamins-Minerals (ZINC PO) Take 200 mg by mouth daily.     pantoprazole (PROTONIX) 40 MG tablet Take 1 tablet Daily with Supper for Indigestion & Acid Reflux 90 tablet 3   Tretinoin, Facial Wrinkles, 0.05 % CREA APPLY TO AFFECTED AREA EVERY DAY AS DIRECTED 45 g 1   No current facility-administered medications for this visit.    Allergies:   Levsin [hyoscyamine sulfate], Mobic [meloxicam], Topamax [topiramate], Zoloft [sertraline hcl], Actonel [risedronate sodium], Boniva [ibandronic acid], Evista [raloxifene], and Fosamax [alendronate sodium]    Social History:  The patient  reports that she quit smoking about 46 years ago. Her smoking use included cigarettes. She has never used smokeless tobacco. She reports that she does not drink alcohol and does not use drugs.   Family History:  The patient's family history includes Aneurysm in her brother; Breast cancer (age of onset: 76) in her sister; Cancer in her father; Diabetes in her brother, brother, and mother; Heart disease in her brother, brother, and mother; Hypertension in her brother; Stroke in her brother.  ROS:  Please see the history of present illness.   All other systems are reviewed and otherwise negative.   PHYSICAL EXAM: VS:  BP 134/84   Pulse 71   Ht 5\' 5"  (1.651 m)   Wt 167 lb (75.8 kg)   SpO2 95%   BMI 27.79 kg/m  BMI: Body mass index is 27.79 kg/m. Well nourished, well developed, in no acute distress, looks younger then her age 21: normocephalic, atraumatic  Neck: no JVD, carotid bruits or masses Cardiac: *** RRR; no significant murmurs, no rubs, or gallops Lungs: *** CTA b/l, no wheezing, rhonchi or rales  Abd: soft, nontender MS: no deformity o atrophy Ext: *** no edema  Skin: warm and dry, no rash Neuro:  No gross deficits appreciated Psych: euthymic mood, full affect  *** PPM site is stable, no tenderness, fluctuation or tethering   EKG:  done today and reviewed by myself ***   PPM interrogation done today and reviewed by myself:  ***  05/19/22; TTE 1. Left ventricular ejection fraction, by estimation, is 60 to 65%. The  left ventricle has normal function. The left ventricle has no regional  wall motion abnormalities. There is moderate asymmetric left ventricular  hypertrophy of the basal-septal  segment. Left ventricular diastolic parameters are consistent with Grade I  diastolic dysfunction (impaired relaxation). The average left ventricular  global longitudinal strain is -17.2 % (borderline)   2. Right ventricular systolic function is  normal. The right ventricular  size is not well visualized. There is mildly elevated pulmonary artery  systolic pressure.  3. The mitral valve is grossly normal. Mild mitral valve regurgitation.   4. The aortic valve is tricuspid. Aortic valve regurgitation is trivial.  No aortic stenosis is present.   5. Aortic ascending aortic aneurysm (45 mm).   6. Left atrial size was mildly dilated.   7. The inferior vena cava is normal in size with greater than 50%  respiratory variability, suggesting right atrial pressure of 3 mmHg.   Conclusion(s)/Recommendation(s): Compared to prior study, MR has improved.    12/09/2020: TTE  1. Left ventricular ejection fraction, by estimation, is 60 to 65%. The  left ventricle has normal function. The left ventricle demonstrates  regional wall motion abnormalities with septal-lateral dyssynchrony. There  is severe asymmetric basal to mid  septal left ventricular hypertrophy. No LV outflow gradient. Left  ventricular diastolic parameters are consistent with Grade I diastolic  dysfunction (impaired relaxation).   2. The mitral valve shows mild systolic anterior motion. Moderate mitral  valve regurgitation. No evidence of mitral stenosis.   3. Left atrial size was mildly dilated.   4. Tricuspid valve regurgitation is moderate to severe.   5. The aortic valve is tricuspid. Aortic valve regurgitation is trivial.  No aortic stenosis is present.   6. Aortic dilatation noted. There is mild dilatation of the ascending  aorta, measuring 42 mm.   7. Right ventricular systolic function is normal. The right ventricular  size is mildly enlarged. There is normal pulmonary artery systolic  pressure. The estimated right ventricular systolic pressure is 28.0 mmHg.   8. The inferior vena cava is normal in size with greater than 50%  respiratory variability, suggesting right atrial pressure of 3 mmHg.   9. This echo is consistent with hypertrophic cardiomyopathy.       11/05/2020: Stress myoview Nuclear stress EF: 69%. Blood pressure demonstrated a normal response to exercise. The study is normal. This is a low risk study. The left ventricular ejection fraction is hyperdynamic (>65%).   Negative nuclear medicine stress test for ischemia or infarction.  03/10/2017: TTE Study Conclusions  - Left ventricle: The cavity size was normal. There was severe    focal basal hypertrophy of the septum and moderate hypertrophy of    the remaining myocardium. Systolic function was normal. The    estimated ejection fraction was in the range of 60% to 65%. Wall    motion was normal; there were no regional wall motion    abnormalities. Doppler parameters are consistent with abnormal    left ventricular relaxation (grade 1 diastolic dysfunction).    Doppler parameters are consistent with elevated ventricular    end-diastolic filling pressure.  - Aortic valve: There was mild regurgitation.  - Mitral valve: There was trivial regurgitation.  - Right ventricle: The cavity size was normal. Wall thickness was    normal. Systolic function was normal.  - Right atrium: The atrium was normal in size.  - Tricuspid valve: There was moderate regurgitation.  - Pulmonary arteries: Systolic pressure was mildly increased. PA    peak pressure: 38 mm Hg (S).  - Inferior vena cava: The vessel was normal in size.  - Pericardium, extracardiac: There was no pericardial effusion.   Impressions:   - Findings consistent with HCM. No difference since the study on    07/31/2015.     11.30.2016: ETT Fair exercise capacity.  No chest pain.  Normal BP response to exercise (139/91 >> 170/99 >> 169/94). Paced rhythm.  ECG could not be interpreted. There were frequent PVCs.  No exercise induced ventricular arrhythmias.     Recent Labs: 01/17/2022: ALT 19; BUN 13; Creat 0.68; Hemoglobin 13.1; Magnesium 2.4; Platelets 201; Potassium 4.8; Sodium 142; TSH 0.65  01/17/2022: Cholesterol 189; HDL  47; LDL Cholesterol (Calc) 114; Total CHOL/HDL Ratio 4.0; Triglycerides 161   CrCl cannot be calculated (Patient's most recent lab result is older than the maximum 21 days allowed.).   Wt Readings from Last 3 Encounters:  04/18/22 167 lb (75.8 kg)  01/25/22 163 lb (73.9 kg)  01/17/22 161 lb 3.2 oz (73.1 kg)     Other studies reviewed: Additional studies/records reviewed today include: summarized above  ASSESSMENT AND PLAN:  1. PPM     *** Intact function     *** No programming changes made      ***  2. Paroxysmal Afib     CHA2DS2Vasc is 4, no longer on a/c with minimal burden/pt preference      *** % burden       *** We will monitor burden   3. HTN     Her home cuff is acurate     *** Looks good  4. HCM Following with Dr. Shari Prows ***        Disposition: ***   Current medicines are reviewed at length with the patient today.  The patient did not have any concerns regarding medicines.  Alexandra Fredrickson, PA-C 04/18/2022 12:17 PM     [facility] [address] [place] [address] [phone] (office)  [phone] (fax)

## 2022-12-08 ENCOUNTER — Encounter: Payer: Self-pay | Admitting: Physician Assistant

## 2022-12-08 ENCOUNTER — Ambulatory Visit: Payer: Medicare PPO | Attending: Physician Assistant | Admitting: Physician Assistant

## 2022-12-08 VITALS — BP 112/78 | HR 61 | Ht 65.0 in | Wt 167.0 lb

## 2022-12-08 DIAGNOSIS — I421 Obstructive hypertrophic cardiomyopathy: Secondary | ICD-10-CM | POA: Diagnosis not present

## 2022-12-08 DIAGNOSIS — I10 Essential (primary) hypertension: Secondary | ICD-10-CM | POA: Diagnosis not present

## 2022-12-08 DIAGNOSIS — Z950 Presence of cardiac pacemaker: Secondary | ICD-10-CM | POA: Diagnosis not present

## 2022-12-08 DIAGNOSIS — I480 Paroxysmal atrial fibrillation: Secondary | ICD-10-CM

## 2022-12-08 DIAGNOSIS — Z5189 Encounter for other specified aftercare: Secondary | ICD-10-CM

## 2022-12-08 LAB — CUP PACEART INCLINIC DEVICE CHECK
Battery Remaining Longevity: 98 mo
Battery Voltage: 3.05 V
Brady Statistic RA Percent Paced: 99.34 %
Brady Statistic RV Percent Paced: 99.98 %
Date Time Interrogation Session: 20240201165138
Implantable Lead Connection Status: 753985
Implantable Lead Connection Status: 753985
Implantable Lead Implant Date: 20140805
Implantable Lead Implant Date: 20140805
Implantable Lead Location: 753859
Implantable Lead Location: 753860
Implantable Lead Model: 1944
Implantable Lead Model: 1948
Implantable Pulse Generator Implant Date: 20240119
Lead Channel Impedance Value: 575 Ohm
Lead Channel Impedance Value: 612.5 Ohm
Lead Channel Pacing Threshold Amplitude: 0.5 V
Lead Channel Pacing Threshold Amplitude: 0.5 V
Lead Channel Pacing Threshold Amplitude: 0.75 V
Lead Channel Pacing Threshold Amplitude: 0.75 V
Lead Channel Pacing Threshold Pulse Width: 0.2 ms
Lead Channel Pacing Threshold Pulse Width: 0.2 ms
Lead Channel Pacing Threshold Pulse Width: 0.5 ms
Lead Channel Pacing Threshold Pulse Width: 0.5 ms
Lead Channel Sensing Intrinsic Amplitude: 3.3 mV
Lead Channel Setting Pacing Amplitude: 2 V
Lead Channel Setting Pacing Amplitude: 2 V
Lead Channel Setting Pacing Pulse Width: 0.5 ms
Lead Channel Setting Sensing Sensitivity: 4 mV
Pulse Gen Model: 2272
Pulse Gen Serial Number: 8145790

## 2022-12-08 NOTE — Patient Instructions (Addendum)
Medication Instructions:   Your physician recommends that you continue on your current medications as directed. Please refer to the Current Medication list given to you today.  *If you need a refill on your cardiac medications before your next appointment, please call your pharmacy*   Lab Work: [place]   If you have labs (blood work) drawn today and your tests are completely normal, you will receive your results only by: [place] (if you have MyChart) OR A paper copy in the mail If you have any lab test that is abnormal or we need to change your treatment, we will call you to review the results.   Testing/Procedures: NONE ORDERED  TODAY      Follow-Up: At [facility], you and your health needs are our priority.  As part of our continuing mission to provide you with exceptional heart care, we have created designated Provider Care Teams.  These Care Teams include your primary Cardiologist (physician) and Advanced Practice Providers (APPs -  Physician Assistants and Nurse Practitioners) who all work together to provide you with the care you need, when you need it.  We recommend signing up for the patient portal called "MyChart".  Sign up information is provided on this After Visit Summary.  MyChart is used to connect with patients for Virtual Visits (Telemedicine).  Patients are able to view lab/test results, encounter notes, upcoming appointments, etc.  Non-urgent messages can be sent to your provider as well.   To learn more about what you can do with MyChart, go to [email or web address].    Your next appointment  AS SCHEDULED   Provider:  Dr Curt Bears      Other Instructions

## 2022-12-13 ENCOUNTER — Other Ambulatory Visit: Payer: Self-pay | Admitting: Internal Medicine

## 2022-12-13 DIAGNOSIS — R450 Nervousness: Secondary | ICD-10-CM

## 2022-12-14 ENCOUNTER — Ambulatory Visit
Admission: RE | Admit: 2022-12-14 | Discharge: 2022-12-14 | Disposition: A | Discharge status: Home / Self Care | Payer: Medicare PPO | Source: Ambulatory Visit | Attending: Physician Assistant | Admitting: Physician Assistant

## 2022-12-14 DIAGNOSIS — Q8500 Neurofibromatosis, unspecified: Secondary | ICD-10-CM | POA: Diagnosis not present

## 2022-12-14 DIAGNOSIS — I712 Thoracic aortic aneurysm, without rupture, unspecified: Secondary | ICD-10-CM | POA: Diagnosis not present

## 2022-12-14 DIAGNOSIS — I2510 Atherosclerotic heart disease of native coronary artery without angina pectoris: Secondary | ICD-10-CM | POA: Diagnosis not present

## 2022-12-14 DIAGNOSIS — I359 Nonrheumatic aortic valve disorder, unspecified: Secondary | ICD-10-CM | POA: Diagnosis not present

## 2022-12-14 MED ORDER — IOPAMIDOL (ISOVUE-370) INJECTION 76%
75.0000 mL | Freq: Once | INTRAVENOUS | Status: AC | PRN
  Administered 2022-12-14: 75 mL via INTRAVENOUS

## 2022-12-16 ENCOUNTER — Ambulatory Visit (INDEPENDENT_AMBULATORY_CARE_PROVIDER_SITE_OTHER): Payer: Medicare PPO | Admitting: Nurse Practitioner

## 2022-12-16 ENCOUNTER — Encounter: Payer: Self-pay | Admitting: Nurse Practitioner

## 2022-12-16 VITALS — BP 118/78 | HR 60 | Temp 97.9°F

## 2022-12-16 DIAGNOSIS — R0981 Nasal congestion: Secondary | ICD-10-CM | POA: Diagnosis not present

## 2022-12-16 DIAGNOSIS — U071 COVID-19: Secondary | ICD-10-CM

## 2022-12-16 DIAGNOSIS — I10 Essential (primary) hypertension: Secondary | ICD-10-CM | POA: Diagnosis not present

## 2022-12-16 DIAGNOSIS — B370 Candidal stomatitis: Secondary | ICD-10-CM | POA: Diagnosis not present

## 2022-12-16 MED ORDER — NYSTATIN 100000 UNIT/ML MT SUSP: OROMUCOSAL | 0 refills | Status: DC

## 2022-12-16 NOTE — Progress Notes (Signed)
Assessment and Plan:  Alexandra Wheeler was seen today for an episodic visit.  Diagnoses and all order for this visit:  Thrush, oral Continue to monitor for increase in thrush. If s/s fail to improve, notify office  - nystatin (MYCOSTATIN) 100000 UNIT/ML suspension; Swish 5 mL in the mouth and retain for as long as possible (several minutes) before swallowing.  Dispense: 60 mL; Refill: 0  Nasal congestion Start OTC antihistamine Zyrtec 10 mg samples provided Stay well hydrated to keep mucus thin and productive  COVID-19 Positive 09/2022 Discussed possible lingering symptoms Continue to monitor  Essential hypertension Continue metoprolol Discussed DASH (Dietary Approaches to Stop Hypertension) DASH diet is lower in sodium than a typical American diet. Cut back on foods that are high in saturated fat, cholesterol, and trans fats. Eat more whole-grain foods, fish, poultry, and nuts Remain active and exercise as tolerated daily.  Monitor BP at home-Call if greater than 130/80.  Check CMP/CBC  Meds ordered this encounter  Medications   nystatin (MYCOSTATIN) 100000 UNIT/ML suspension    Sig: Swish 5 mL in the mouth and retain for as long as possible (several minutes) before swallowing.    Dispense:  60 mL    Refill:  0    Order Specific Question:   Supervising Provider    Answer:   Unk Pinto [phone]   Notify office for further evaluation and treatment, questions or concerns if s/s fail to improve. The risks and benefits of my recommendations, as well as other treatment options were discussed with the patient today. Questions were answered.  Further disposition pending results of labs. Discussed med's effects and SE's.    Over 20 minutes of exam, counseling, chart review, and critical decision making was performed.   Future Appointments  Date Time Provider [place]  01/23/2023  2:00 PM Unk Pinto, MD [facility] None  02/24/2023  7:00 AM [facility]  DEVICE REMOTES [facility]  02/27/2023  2:00 PM Constance Haw, MD [facility]  04/26/2023 11:00 AM Alycia Rossetti, NP [facility] None  05/19/2023 10:00 AM Freada Bergeron, MD [facility]  05/26/2023  7:00 AM [facility] DEVICE REMOTES [facility]  08/25/2023  7:00 AM [facility] DEVICE REMOTES [facility]  11/24/2023  7:00 AM [facility] DEVICE REMOTES [facility]  02/23/2024  7:00 AM [facility] DEVICE REMOTES [facility]  05/24/2024  7:00 AM [facility] DEVICE REMOTES [facility]  08/23/2024  7:00 AM [facility] DEVICE REMOTES [facility]  11/22/2024  7:00 AM [facility] DEVICE REMOTES [facility]    ------------------------------------------------------------------------------------------------------------------   HPI BP 118/78   Pulse 60   Temp 97.9 F (36.6 C)   SpO2 99%   82 y.o.female presents for evaluation of patchy white spots on tongue and back of throat, some painful swallowing, feels as though tongue is burning.  Present for the last 3 weeks.  She has tried magic mouthwash without relief.  Feels she is also noticing increase in nasal congestion and mucus.  Had Covid in 09/2022 and feels as though she has not fully recovered.  Denies fever, chills, N/V. Denies any recent abx use.  Past Medical History:  Diagnosis Date   Anxiety    Atrophic vaginitis    Chest pain    a. Adenosine sestamibi (1/14) with no evidence of ischemia or infarction, EF 63%.    Colon polyps    Complete heart block ([place]) 06-2013   status post pacemaker implantation by Dr Rayann Heman 06-11-2013   Depression    DI (detrusor instability)  GERD (gastroesophageal reflux disease)    History + Hyplori via EGD   H/O echocardiogram    a. Echo 11/2012:EF 50-55%, focal basal septal hypertrophy with some mitral valve SAM (mild) and MR.  This may be a sigmoid septum with  advanced age or could be a variant of hypertrophic cardiomyopathy.   HOCM (hypertrophic obstructive cardiomyopathy) ([place])    Echocardiogram 2/22: EF 60-65, no RWMA, severe asymmetric septal LVH, no LVOT, Gr 1 DD, mild SAM, mod MR, mild LAE, mod to severe TR, Asc Ao 42 mm, normal RVSF, RVSP 28   HTN (hypertension)    Hyperlipidemia    LBBB (left bundle branch block)    Leukopenia 05/19/2014   Lymphocytosis 05/19/2014   Migraines    Myoview    Myoview 12/21: EF 69, no ischemia or infarction; low risk    Osteoporosis    Paroxysmal atrial fibrillation ([place])    Prediabetes    Thoracic aortic aneurysm ([place])    Chest CTA 2/22: Ascending thoracic aortic aneurysm 44 mm; Ao arch 34 mm>>repeat 1 year   Vitamin D deficiency      Allergies  Allergen Reactions   Levsin [Hyoscyamine Sulfate]     unknown   Mobic [Meloxicam]     unknown    Topamax [Topiramate]     unknown    Zoloft [Sertraline Hcl]     unknown    Actonel [Risedronate Sodium] Other (See Comments)    Pt didn't like the way it made her feel    Boniva [Ibandronic Acid] Other (See Comments)    Pt didn't like the way it made her feel   Evista [Raloxifene] Other (See Comments)    Pt didn't like the way it made her feel   Fosamax [Alendronate Sodium] Other (See Comments)    Pt didn't like the way it made her feel     Current Outpatient Medications on File Prior to Visit  Medication Sig   acetaminophen (TYLENOL) 500 MG tablet Take 500 mg by mouth 2 (two) times daily.   Alcohol Swabs (B-D SINGLE USE SWABS REGULAR) PADS Use as directed for Glucose monitoring Daily   ALPRAZolam (XANAX) 0.5 MG tablet TAKE 1/2 TO 1 TAB DAILY ONLY IF NEEDED FOR SLEEP OR TREMOR AND LIMIT TO 5 DAYS /WEEK TO AVOID ADDICTION AND DEMENTIA   Ascorbic Acid (VITAMIN C PO) Take 1 tablet by mouth daily.    CALCIUM PO Take 1 tablet by mouth daily.   Cholecalciferol (VITAMIN D PO) Take 2 capsules by mouth daily.   citalopram (CELEXA) 20 MG tablet TAKE 1 TABLET  EVERY DAY FOR MOOD (Patient taking differently: Take 20 mg by mouth at bedtime.)   FOLIC ACID PO Take 1 tablet by mouth daily.    Hydrocortisone Acetate 1 % OINT Apply to Hemorrhoid 3 to 4  x /day (Patient taking differently: Apply 1 Application topically 4 (four) times daily as needed (hemorrhoid).)   MAGNESIUM PO Take 1 tablet by mouth daily.   metoprolol (TOPROL-XL) 200 MG 24 hr tablet TAKE 1 TABLET EVERY DAY FOR BLOOD PRESSURE AND HEART   Multiple Vitamin (MULTIVITAMIN) tablet Take 1 tablet by mouth daily.   Multiple Vitamins-Minerals (ZINC PO) Take 1 tablet by mouth daily.   naproxen sodium (ALEVE) 220 MG tablet Take 220 mg by mouth daily as needed (pain).   pantoprazole (PROTONIX) 40 MG tablet Take 1 tablet Daily with Supper for Indigestion & Acid Reflux (Patient taking differently: Take 40 mg by mouth daily as needed (acid reflux).)  tretinoin (RETIN-A) 0.025 % cream Please specify directions, refills and quantity (Patient taking differently: Apply 1 Application topically daily as needed (wrinkles). Please specify directions, refills and quantity)   No current facility-administered medications on file prior to visit.    ROS: all negative except what is noted in the HPI.   Physical Exam:  BP 118/78   Pulse 60   Temp 97.9 F (36.6 C)   SpO2 99%   General Appearance: NAD.  Awake, conversant and cooperative. Eyes: PERRLA, EOMs intact.  Sclera white.  Conjunctiva without erythema. Sinuses: No frontal/maxillary tenderness.  No nasal discharge. Nares patent.  ENT/Mouth: Tongue and throat with mild scattered white film unable to be scrapped off.  Ext aud canals clear.  Bilateral TMs w/DOL and without erythema or bulging. Hearing intact.   Neck: Supple.  No masses, nodules or thyromegaly. Respiratory: Effort is regular with non-labored breathing. Breath sounds are equal bilaterally without rales, rhonchi, wheezing or stridor.  Cardio: RRR with no MRGs. Brisk peripheral pulses without  edema.  Abdomen: Active BS in all four quadrants.  Soft and non-tender without guarding, rebound tenderness, hernias or masses. Lymphatics: Non tender without lymphadenopathy.  Musculoskeletal: Full ROM, 5/5 strength, normal ambulation.  No clubbing or cyanosis. Skin: Appropriate color for ethnicity. Warm without rashes, lesions, ecchymosis, ulcers.  Neuro: CN II-XII grossly normal. Normal muscle tone without cerebellar symptoms and intact sensation.   Psych: AO X 3,  appropriate mood and affect, insight and judgment.     Darrol Jump, NP 12:44 PM [facility]

## 2022-12-20 ENCOUNTER — Telehealth: Payer: Self-pay | Admitting: *Deleted

## 2022-12-20 DIAGNOSIS — I77819 Aortic ectasia, unspecified site: Secondary | ICD-10-CM

## 2022-12-20 NOTE — Telephone Encounter (Signed)
-----   Message from Liliane Shi, [place] sent at 12/16/2022 12:58 PM EST ----- Results sent to [name] via [place]. See MyChart comment. I will forward a copy to her PCP as an FYI. PLAN: -Repeat gated chest/aorta CTA in 1 year (thoracic aortic aneurysm)  Alexandra Wheeler  Your CT scan demonstrates stable size of your thoracic aorta.  We will continue to monitor this with annual CT scans. Richardson Dopp, PA-C    12/16/2022 12:53 PM

## 2022-12-26 NOTE — Progress Notes (Signed)
Remote pacemaker transmission.   

## 2023-01-20 ENCOUNTER — Telehealth: Payer: Self-pay

## 2023-01-20 NOTE — Progress Notes (Signed)
Reviewing patients chart prior to assessment call. Reviewed office visits, specialists visits, hospital visits, medications, adherence, and labs. Called to connect with patient. No answer, left voicemail to return call.   Time spent: 10 min.    4:10 pm- Patient returned call and assessment complete. She states that she is doing well. Her pacemaker battery replacement procedure wound has healed and is doing well. She reports that she had a problem with a while film on her tongue that she saw [place] about in the office. She reports that the Nystatin did not help. She has a visit scheduled with Dr. Melford Aase on Monday 01/23/23 to see what else can be done.   Time spent: 10 min.  Total time: 20 min.  Hildred Alamin, CTL

## 2023-01-22 ENCOUNTER — Encounter: Payer: Self-pay | Admitting: Internal Medicine

## 2023-01-22 NOTE — Patient Instructions (Signed)

## 2023-01-22 NOTE — Progress Notes (Unsigned)
Annual Screening/Preventative Visit & Comprehensive Evaluation &  Examination  Future Appointments  Date Time Provider Department  01/23/2023                    cpe  2:00 PM Unk Pinto, MD [facility]  02/27/2023  2:00 PM Constance Haw, MD [facility]  04/26/2023                   wellness 11:00 AM Alycia Rossetti, NP [facility]  05/19/2023 10:00 AM Freada Bergeron, MD [facility]  01/30/2024                    cpe  2:00 PM Unk Pinto, MD [facility]        This very nice 82 y.o. WWF  with HTN, HLD, T2_NIDDM  Prediabetes  and Vitamin D Deficiency  presents for a Screening /Preventative Visit & comprehensive evaluation and management of multiple medical co-morbidities.   In Feb 2023,  Chest CT showed Aortic Atherosclerosis & an ascending thoracic aortic aneurysm followed by Cardiology. Patient has GERD controlled with Diet & Pantoprazole.          HTN predates since 2005.  In 2014 , patient presented asymptomatic with CHB and ultimately had PPM implanted. Myoview was negative in Jan 2014 as was heart cath in Aug 2014. ETT in 2016 was also Negative.  I n 2019 , she was discovered in pAfib and was started on Eliquis - later  d/c'd in Oct 2020.     Dr Rayann Heman follows patient for HCM /ASH and  Ao Arch Thoracic Aneurysm .  Patient's BP has been controlled at home and patient denies any cardiac symptoms as chest pain, palpitations, shortness of breath, dizziness or ankle swelling. Today's           Patient's hyperlipidemia is not controlled with diet. Last lipids were not at goal :  Lab Results  Component Value Date   CHOL 195 08/29/2022   HDL 55 08/29/2022   LDLCALC 117 (H) 08/29/2022   TRIG 121 08/29/2022   CHOLHDL 3.5 08/29/2022         Patient has hx/o prediabetes (A1c 5.9% /2011 & 2016) and patient denies reactive hypoglycemic symptoms, visual blurring, diabetic polys or paresthesias. Last A1c was near goal :  Lab Results  Component Value Date   HGBA1C  5.9 (H) 08/29/2022         Finally, patient has history of Vitamin D Deficiency ("34" /2008) and last Vitamin D was at goal :  Lab Results  Component Value Date   VD25OH 65 01/17/2022       Current Outpatient Medications on File Prior to Visit  Medication Sig   acetaminophen (TYLENOL) 500 MG tablet Take 1,000 mg  in the morning and at bedtime.   ALPRAZolam (XANAX) 1 MG tablet TAKE 1/2 TO 1 TAB AT BEDTIME IF NEEDED    Ascorbic Acid (VITAMIN C PO) Take 1 tablet daily.    Calcium Carbonate-Vitamin D  Take 1 tablet daily.    VITAMIN D 5,000 Units  Take 2 (two) times a day.    citalopram 20 MG tablet TAKE 1 TABLET EVERY DAY    FOLIC ACID P Take 1 tablet daily.    Hctz  25 MG tablet Take daily as needed (swelling).    Hydrocortisone 1 % OINT Apply to Hemorrhoid 3 to 4  x /day   MAGNESIUM 500 mg Take  daily.    metoprolol -  XL 200 MG 2 TAKE 1 TABLET EVERY DAY    Multiple Vitamin  Take 1 tablet  daily.   Pantoprazole 40 MG tablet Take 1 tablet Daily    Tretinoin, Facial Wrinkles, 0.05 % CREA APPLY EVERY DAY AS DIRECTED     Allergies  Allergen Reactions   Levsin [Hyoscyamine Sulfate] unknown   Mobic [Meloxicam] unknown   Topamax [Topiramate] unknown   Zoloft [Sertraline Hcl] unknown   Actonel [Risedronate Sodium] Pt didn't like the way it made her feel   Boniva [Ibandronic Acid] Pt didn't like the way it made her feel   Evista [Raloxifene] Pt didn't like the way it made her feel   Fosamax [Alendronate Sodium] Pt didn't like the way it made her feel     Past Medical History:  Diagnosis Date   Anxiety    Atrophic vaginitis    Chest pain    a. Adenosine sestamibi (1/14) with no evidence of ischemia or infarction, EF 63%.    Colon polyps    Complete heart block ([place]) 06-2013   status post pacemaker implantation by Dr Rayann Heman 06-11-2013   Depression    DI (detrusor instability)    GERD (gastroesophageal reflux disease)    History + Hyplori via EGD   H/O echocardiogram    a.  Echo 11/2012:EF 50-55%, focal basal septal hypertrophy with some mitral valve SAM (mild) and MR.  This may be a sigmoid septum with advanced age or could be a variant of hypertrophic cardiomyopathy.   HOCM (hypertrophic obstructive cardiomyopathy) ([place])    Echocardiogram 2/22: EF 60-65, no RWMA, severe asymmetric septal LVH, no LVOT, Gr 1 DD, mild SAM, mod MR, mild LAE, mod to severe TR, Asc Ao 42 mm, normal RVSF, RVSP 28   HTN (hypertension)    Hyperlipidemia    LBBB (left bundle branch block)    Leukopenia 05/19/2014   Lymphocytosis 05/19/2014   Migraines    Myoview    Myoview 12/21: EF 69, no ischemia or infarction; low risk    Osteoporosis    Paroxysmal atrial fibrillation ([place])    Prediabetes    Thoracic aortic aneurysm    Chest CTA 2/22: Ascending thoracic aortic aneurysm 44 mm; Ao arch 34 mm>>repeat 1 year   Vitamin D deficiency      Health Maintenance  Topic Date Due   COVID-19 Vaccine (1) Never done   Zoster Vaccines- Shingrix (1 of 2) Never done   Pneumonia Vaccine 28+ Years old (2 - PPSV23 if available, else PCV20) 08/31/2016   TETANUS/TDAP  05/08/2021   INFLUENZA VACCINE  06/07/2021   DEXA SCAN  Completed   HPV VACCINES  Aged Out     Immunization History  Administered Date(s) Administered   Fluad Quad(high Dose) 07/25/2019   Influenza 08/06/2014, 09/20/2017   Influenza, High Dose  09/01/2015, 08/19/2016   Influenza 09/12/2018   Pneumococcal -13 09/01/2015   Pneumococcal -23 10/08/2010   Td 05/09/2011   Zoster, Live 05/09/2011    Last Colon - 11/11/2013 - Dr Romilda Garret -  Aged out   Last MGM - 10/26/2022   Dexa BMD - 07/05/2017   T= -2.7 Spine , Osteoporosis - overdue Last Dexa BMD - 07/20/2022  - t= -3.1 worse Osteoporosis - intolerant bisphosphonates   Past Surgical History:  Procedure Laterality Date   BREAST BIOPSY Left 07/25/2019   ANGIOLIPOMA   BREAST EXCISIONAL BIOPSY Right    BREAST SURGERY     Breast cyst   CATARACT EXTRACTION Left 2019  Dr.  Katy Fitch   HEMORRHOID SURGERY     LEFT HEART CATHETERIZATION WITH CORONARY ANGIOGRAM N/A 06/11/2013   Procedure: LEFT HEART CATHETERIZATION WITH CORONARY ANGIOGRAM;  Surgeon: Burnell Blanks, MD;  Location: [facility] CATH LAB;  Service: Cardiovascular;  Laterality: N/A;   PACEMAKER INSERTION  06-11-2013   STJ dual chamber pacemaker implanted by Dr Rayann Heman for complete heart block   PERMANENT PACEMAKER INSERTION Left 06/11/2013   Procedure: PERMANENT PACEMAKER INSERTION;  Surgeon: Burnell Blanks, MD;  Location: [facility] CATH LAB;  Service: Cardiovascular;  Laterality: Left;   PERMANENT PACEMAKER INSERTION N/A 06/11/2013   Procedure: PERMANENT PACEMAKER INSERTION;  Surgeon: Thompson Grayer, MD;  Location: [facility] CATH LAB;  Service: Cardiovascular;  Laterality: N/A;   TEMPORARY PACEMAKER INSERTION N/A 06/11/2013   Procedure: TEMPORARY PACEMAKER INSERTION;  Surgeon: Burnell Blanks, MD;  Location: [facility] CATH LAB;  Service: Cardiovascular;  Laterality: N/A;   TUBAL LIGATION       Family History  Problem Relation Age of Onset   Diabetes Mother    Heart disease Mother    Diabetes Brother    Hypertension Brother    Heart disease Brother    Breast cancer Sister 66   Aneurysm Brother    Stroke Brother    Cancer Father        prostate   Diabetes Brother    Heart disease Brother      Social History   Tobacco Use   Smoking status: Former    Types: Cigarettes    Quit date: 10/09/1975    Years since quitting: 46.3   Smokeless tobacco: Never  Vaping Use   Vaping Use: Never used  Substance Use Topics   Alcohol use: No   Drug use: No      ROS Constitutional: Denies fever, chills, weight loss/gain, headaches, insomnia,  night sweats, and change in appetite. Does c/o fatigue. Eyes: Denies redness, blurred vision, diplopia, discharge, itchy, watery eyes.  ENT: Denies discharge, congestion, post nasal drip, epistaxis, sore throat, earache, hearing loss, dental pain, Tinnitus, Vertigo, Sinus pain,  snoring.  Cardio: Denies chest pain, palpitations, irregular heartbeat, syncope, dyspnea, diaphoresis, orthopnea, PND, claudication, edema Respiratory: denies cough, dyspnea, DOE, pleurisy, hoarseness, laryngitis, wheezing.  Gastrointestinal: Denies dysphagia, heartburn, reflux, water brash, pain, cramps, nausea, vomiting, bloating, diarrhea, constipation, hematemesis, melena, hematochezia, jaundice, hemorrhoids Genitourinary: Denies dysuria, frequency, urgency, nocturia, hesitancy, discharge, hematuria, flank pain Breast: Breast lumps, nipple discharge, bleeding.  Musculoskeletal: Denies arthralgia, myalgia, stiffness, Jt. Swelling, pain, limp, and strain/sprain. Denies falls. Skin: Denies puritis, rash, hives, warts, acne, eczema, changing in skin lesion Neuro: No weakness, tremor, incoordination, spasms, paresthesia, pain Psychiatric: Denies confusion, memory loss, sensory loss. Denies Depression. Endocrine: Denies change in weight, skin, hair change, nocturia, and paresthesia, diabetic polys, visual blurring, hyper / hypo glycemic episodes.  Heme/Lymph: No excessive bleeding, bruising, enlarged lymph nodes.  Physical Exam  There were no vitals taken for this visit.  General Appearance: Well nourished, well groomed and in no apparent distress.  Eyes: PERRLA, EOMs, conjunctiva no swelling or erythema, normal fundi and vessels. Sinuses: No frontal/maxillary tenderness ENT/Mouth: EACs patent / TMs  nl. Nares clear without erythema, swelling, mucoid exudates. Oral hygiene is good. No erythema, swelling, or exudate. Tongue normal, non-obstructing. Tonsils not swollen or erythematous. Hearing normal.  Neck: Supple, thyroid not palpable. No bruits, nodes or JVD. Respiratory: Respiratory effort normal.  BS equal and clear bilateral without rales, rhonci, wheezing or stridor. Cardio: Heart sounds are normal with regular rate and rhythm and  no murmurs, rubs or gallops. Peripheral pulses are normal  and equal bilaterally without edema. No aortic or femoral bruits. Chest: symmetric with normal excursions and percussion. Breasts: Symmetric, without lumps, nipple discharge, retractions, or fibrocystic changes.  Abdomen: Flat, soft with bowel sounds active. Nontender, no guarding, rebound, hernias, masses, or organomegaly.  Lymphatics: Non tender without lymphadenopathy.  Genitourinary:  Musculoskeletal: Full ROM all peripheral extremities, joint stability, 5/5 strength, and normal gait. Skin: Warm and dry without rashes, lesions, cyanosis, clubbing or  ecchymosis.  Neuro: Cranial nerves intact, reflexes equal bilaterally. Normal muscle tone, no cerebellar symptoms. Sensation intact.  ASlight high frequency low amplitude action tremor of Lt > Rt  hand.  Pysch: Alert and oriented X 3, normal affect, Insight and Judgment appropriate.    Assessment and Plan  1. Annual Preventative Screening Examination   2. Essential hypertension  - EKG 12-Lead - Urinalysis, Routine w reflex microscopic - Microalbumin / creatinine urine ratio - CBC with Differential/Platelet - COMPLETE METABOLIC PANEL WITH GFR - Magnesium - TSH  3. Hyperlipidemia, mixed  - EKG 12-Lead - Lipid panel - TSH  4. Abnormal glucose  - EKG 12-Lead - Hemoglobin A1c - Insulin, random  5. Vitamin D deficiency  - VITAMIN D 25 Hydroxy   6. Thoracic aortic aneurysm without rupture, ([place])  - EKG 12-Lead - Lipid panel  7. CHB (complete heart block) ([facility])  - EKG 12-Lead  8. Paroxysmal atrial fibrillation ([facility])  - EKG 12-Lead - TSH  9. Pacemaker  - EKG 12-Lead  10. Localized osteoporosis without current pathological fracture  - COMPLETE METABOLIC PANEL WITH GFR - TSH  11. Familial tremor   12. Screening for heart disease  - EKG 12-Lead  13. FHx: heart disease  - EKG 12-Lead  14. Former smoker  - EKG 12-Lead  15. Medication management  - Urinalysis, Routine w reflex microscopic -  Microalbumin / creatinine urine ratio - CBC with Differential/Platelet - COMPLETE METABOLIC PANEL WITH GFR - Magnesium - Lipid panel - TSH - Hemoglobin A1c - Insulin, random - VITAMIN D 25 Hydroxy   16. Screening for colorectal cancer  - POC Hemoccult Bld/Stl           Patient was counseled in prudent diet to achieve/maintain BMI less than 25 for weight control, BP monitoring, regular exercise and medications. Discussed med's effects and SE's. Screening labs and tests as requested with regular follow-up as recommended. Over 40 minutes of exam, counseling, chart review and high complex critical decision making was performed.   Kirtland Bouchard, MD

## 2023-01-23 ENCOUNTER — Ambulatory Visit (INDEPENDENT_AMBULATORY_CARE_PROVIDER_SITE_OTHER): Payer: Medicare PPO | Admitting: Internal Medicine

## 2023-01-23 ENCOUNTER — Encounter: Payer: Self-pay | Admitting: Internal Medicine

## 2023-01-23 VITALS — BP 120/68 | HR 79 | Temp 97.7°F | Resp 17 | Ht 65.0 in | Wt 173.6 lb

## 2023-01-23 DIAGNOSIS — Z8249 Family history of ischemic heart disease and other diseases of the circulatory system: Secondary | ICD-10-CM

## 2023-01-23 DIAGNOSIS — I480 Paroxysmal atrial fibrillation: Secondary | ICD-10-CM

## 2023-01-23 DIAGNOSIS — Z1211 Encounter for screening for malignant neoplasm of colon: Secondary | ICD-10-CM

## 2023-01-23 DIAGNOSIS — E782 Mixed hyperlipidemia: Secondary | ICD-10-CM | POA: Diagnosis not present

## 2023-01-23 DIAGNOSIS — Z79899 Other long term (current) drug therapy: Secondary | ICD-10-CM | POA: Diagnosis not present

## 2023-01-23 DIAGNOSIS — Z136 Encounter for screening for cardiovascular disorders: Secondary | ICD-10-CM

## 2023-01-23 DIAGNOSIS — E559 Vitamin D deficiency, unspecified: Secondary | ICD-10-CM

## 2023-01-23 DIAGNOSIS — F419 Anxiety disorder, unspecified: Secondary | ICD-10-CM

## 2023-01-23 DIAGNOSIS — Z87891 Personal history of nicotine dependence: Secondary | ICD-10-CM

## 2023-01-23 DIAGNOSIS — I10 Essential (primary) hypertension: Secondary | ICD-10-CM | POA: Diagnosis not present

## 2023-01-23 DIAGNOSIS — Z0001 Encounter for general adult medical examination with abnormal findings: Secondary | ICD-10-CM

## 2023-01-23 DIAGNOSIS — R7309 Other abnormal glucose: Secondary | ICD-10-CM | POA: Diagnosis not present

## 2023-01-23 DIAGNOSIS — Z950 Presence of cardiac pacemaker: Secondary | ICD-10-CM

## 2023-01-23 DIAGNOSIS — G250 Essential tremor: Secondary | ICD-10-CM

## 2023-01-23 DIAGNOSIS — I712 Thoracic aortic aneurysm, without rupture, unspecified: Secondary | ICD-10-CM | POA: Diagnosis not present

## 2023-01-23 DIAGNOSIS — M816 Localized osteoporosis [Lequesne]: Secondary | ICD-10-CM | POA: Diagnosis not present

## 2023-01-23 DIAGNOSIS — Z0000 Encounter for general adult medical examination without abnormal findings: Secondary | ICD-10-CM | POA: Diagnosis not present

## 2023-01-23 DIAGNOSIS — I442 Atrioventricular block, complete: Secondary | ICD-10-CM

## 2023-01-23 MED ORDER — HYDROXYZINE HCL 25 MG PO TABS: ORAL_TABLET | ORAL | 2 refills | Status: DC

## 2023-01-24 ENCOUNTER — Other Ambulatory Visit: Payer: Self-pay | Admitting: Internal Medicine

## 2023-01-24 DIAGNOSIS — E782 Mixed hyperlipidemia: Secondary | ICD-10-CM

## 2023-01-24 LAB — CBC WITH DIFFERENTIAL/PLATELET
Absolute Monocytes: 446 cells/uL (ref 200–950)
Basophils Absolute: 39 cells/uL (ref 0–200)
Basophils Relative: 0.7 %
Eosinophils Absolute: 83 cells/uL (ref 15–500)
Eosinophils Relative: 1.5 %
HCT: 39 % (ref 35.0–45.0)
Hemoglobin: 12.7 g/dL (ref 11.7–15.5)
Lymphs Abs: 2206 cells/uL (ref 850–3900)
MCH: 28.6 pg (ref 27.0–33.0)
MCHC: 32.6 g/dL (ref 32.0–36.0)
MCV: 87.8 fL (ref 80.0–100.0)
MPV: 10.8 fL (ref 7.5–12.5)
Monocytes Relative: 8.1 %
Neutro Abs: 2728 cells/uL (ref 1500–7800)
Neutrophils Relative %: 49.6 %
Platelets: 203 10*3/uL (ref 140–400)
RBC: 4.44 10*6/uL (ref 3.80–5.10)
RDW: 12.4 % (ref 11.0–15.0)
Total Lymphocyte: 40.1 %
WBC: 5.5 10*3/uL (ref 3.8–10.8)

## 2023-01-24 LAB — HEMOGLOBIN A1C
Hgb A1c MFr Bld: 5.8 % of total Hgb — ABNORMAL HIGH (ref <5.7)
Mean Plasma Glucose: 120 mg/dL
eAG (mmol/L): 6.6 mmol/L

## 2023-01-24 LAB — TSH: TSH: 0.76 mIU/L (ref 0.40–4.50)

## 2023-01-24 LAB — LIPID PANEL
Cholesterol: 217 mg/dL — ABNORMAL HIGH (ref <200)
HDL: 54 mg/dL (ref >=50)
LDL Cholesterol (Calc): 140 mg/dL (calc) — ABNORMAL HIGH
Non-HDL Cholesterol (Calc): 163 mg/dL (calc) — ABNORMAL HIGH (ref <130)
Total CHOL/HDL Ratio: 4 (calc) (ref <5.0)
Triglycerides: 113 mg/dL (ref <150)

## 2023-01-24 LAB — URINALYSIS, ROUTINE W REFLEX MICROSCOPIC
Bacteria, UA: NONE SEEN /HPF
Bilirubin Urine: NEGATIVE
Glucose, UA: NEGATIVE
Hgb urine dipstick: NEGATIVE
Hyaline Cast: NONE SEEN /LPF
Ketones, ur: NEGATIVE
Nitrite: NEGATIVE
Protein, ur: NEGATIVE
Specific Gravity, Urine: 1.019 (ref 1.001–1.035)
Squamous Epithelial / HPF: NONE SEEN /HPF (ref <=5)
pH: 6.5 (ref 5.0–8.0)

## 2023-01-24 LAB — COMPLETE METABOLIC PANEL WITH GFR
AG Ratio: 1.5 (calc) (ref 1.0–2.5)
ALT: 17 U/L (ref 6–29)
AST: 18 U/L (ref 10–35)
Albumin: 4.1 g/dL (ref 3.6–5.1)
Alkaline phosphatase (APISO): 82 U/L (ref 37–153)
BUN: 13 mg/dL (ref 7–25)
CO2: 30 mmol/L (ref 20–32)
Calcium: 9.5 mg/dL (ref 8.6–10.4)
Chloride: 107 mmol/L (ref 98–110)
Creat: 0.79 mg/dL (ref 0.60–0.95)
Globulin: 2.7 g/dL (calc) (ref 1.9–3.7)
Glucose, Bld: 88 mg/dL (ref 65–99)
Potassium: 4.3 mmol/L (ref 3.5–5.3)
Sodium: 142 mmol/L (ref 135–146)
Total Bilirubin: 0.5 mg/dL (ref 0.2–1.2)
Total Protein: 6.8 g/dL (ref 6.1–8.1)
eGFR: 75 mL/min/{1.73_m2} (ref >=60)

## 2023-01-24 LAB — MICROALBUMIN / CREATININE URINE RATIO
Creatinine, Urine: 132 mg/dL (ref 20–275)
Microalb Creat Ratio: 11 mg/g creat (ref <30)
Microalb, Ur: 1.5 mg/dL

## 2023-01-24 LAB — INSULIN, RANDOM: Insulin: 6 u[IU]/mL

## 2023-01-24 LAB — VITAMIN D 25 HYDROXY (VIT D DEFICIENCY, FRACTURES): Vit D, 25-Hydroxy: 85 ng/mL (ref 30–100)

## 2023-01-24 LAB — MAGNESIUM: Magnesium: 2.1 mg/dL (ref 1.5–2.5)

## 2023-01-24 LAB — MICROSCOPIC MESSAGE

## 2023-01-24 MED ORDER — ROSUVASTATIN CALCIUM 10 MG PO TABS: ORAL_TABLET | ORAL | 3 refills | Status: DC

## 2023-01-24 NOTE — Progress Notes (Signed)
<><><><><><><><><><><><><><><><><><><><><><><><><><><><><><><><><> <><><><><><><><><><><><><><><><><><><><><><><><><><><><><><><><><>  - Test results slightly outside the reference range are not unusual. If there is anything important, I will review this with you,  otherwise it is considered normal test values.  If you have further questions,  please do not hesitate to contact me at the office or via My Chart.   <><><><><><><><><><><><><><><><><><><><><><><><><><><><><><><><><> <><><><><><><><><><><><><><><><><><><><><><><><><><><><><><><><><> <><><><><><><><><><><><><><><><><><><><><><><><><><><><><><><><><>  -  Total  Chol =    217       - Elevated             (  Ideal  or  Goal is less than 180  !  )  & -  Bad / Dangerous LDL  Chol =   140  - also Elevated              (  Ideal  or  Goal is less than 70  !  )    Sent Rx for low dose medicine to help lower your cholesterol  - Diet is still very important .   - Cholesterol only comes from animal sources                                                                               - ie. meat, dairy, egg yolks  - Eat all the vegetables you want.  - Avoid Meat, Avoid Meat,  Avoid Meat                                                                      - especially Red Meat - Beef AND Pork .  - Avoid cheese & dairy - milk & ice cream.     - Cheese is the most concentrated form of trans-fats which                                                                       is the worst thing to clog up our arteries.   - Veggie cheese is OK which can be found in the fresh                                      produce section at [employer] or [employer] or [employer] <><><><><><><><><><><><><><><><><><><><><><><><><><><><><><><><><>  - .aic = 5.8%  Blood sugar and A1c are STILL elevated in  the borderline and early or pre-diabetes range   which has the same 300% increased  risk for heart attack, stroke, cancer and                                                alzheimer- type vascular dementia as full blown diabetes.   But the good news is that diet, exercise with weight loss can                                                                                cure the early diabetes at this point. <><><><><><><><><><><><><><><><><><><><><><><><><><><><><><><><><> <><><><><><><><><><><><><><><><><><><><><><><><><><><><><><><><><>  -  Vitamin D = 85  - Excellent  - Please keep dose same  <><><><><><><><><><><><><><><><><><><><><><><><><><><><><><><><><>  - All Else - CBC - Kidneys - Electrolytes - Liver - Magnesium & Thyroid    - all  Normal / OK <><><><><><><><><><><><><><><><><><><><><><><><><><><><><><><><><> <><><><><><><><><><><><><><><><><><><><><><><><><><><><><><><><><> <><><><><><><><><><><><><><><><><><><><><><><><><><><><><><><><><>

## 2023-02-02 ENCOUNTER — Other Ambulatory Visit: Payer: Self-pay | Admitting: Internal Medicine

## 2023-02-05 DIAGNOSIS — E782 Mixed hyperlipidemia: Secondary | ICD-10-CM | POA: Diagnosis not present

## 2023-02-05 DIAGNOSIS — I10 Essential (primary) hypertension: Secondary | ICD-10-CM | POA: Diagnosis not present

## 2023-02-15 ENCOUNTER — Ambulatory Visit (INDEPENDENT_AMBULATORY_CARE_PROVIDER_SITE_OTHER): Payer: Medicare PPO | Admitting: Internal Medicine

## 2023-02-15 ENCOUNTER — Encounter: Payer: Self-pay | Admitting: Internal Medicine

## 2023-02-15 VITALS — BP 138/80 | HR 62 | Temp 97.9°F | Resp 17 | Ht 65.0 in | Wt 175.0 lb

## 2023-02-15 DIAGNOSIS — J4531 Mild persistent asthma with (acute) exacerbation: Secondary | ICD-10-CM

## 2023-02-15 DIAGNOSIS — G4483 Primary cough headache: Secondary | ICD-10-CM | POA: Diagnosis not present

## 2023-02-15 DIAGNOSIS — J40 Bronchitis, not specified as acute or chronic: Secondary | ICD-10-CM

## 2023-02-15 DIAGNOSIS — Z1152 Encounter for screening for COVID-19: Secondary | ICD-10-CM | POA: Diagnosis not present

## 2023-02-15 LAB — POC COVID19 BINAXNOW: SARS Coronavirus 2 Ag: NEGATIVE

## 2023-02-15 LAB — POC INFLUENZA A&B (BINAX/QUICKVUE)
Influenza A, POC: NEGATIVE
Influenza B, POC: NEGATIVE

## 2023-02-15 MED ORDER — DEXAMETHASONE 4 MG PO TABS
ORAL_TABLET | ORAL | 0 refills | Status: DC
Start: 2023-02-15 — End: 2023-04-26

## 2023-02-15 MED ORDER — PROMETHAZINE-DM 6.25-15 MG/5ML PO SYRP
ORAL_SOLUTION | ORAL | 1 refills | Status: DC
Start: 2023-02-15 — End: 2023-04-26

## 2023-02-15 MED ORDER — AZITHROMYCIN 250 MG PO TABS
ORAL_TABLET | ORAL | 1 refills | Status: DC
Start: 2023-02-15 — End: 2023-04-26

## 2023-02-15 NOTE — Progress Notes (Signed)
Future Appointments  Date Time Provider Department  02/15/2023 11:30 AM Lucky Cowboy, MD [facility]  02/27/2023  2:00 PM Regan Lemming, MD [facility]  04/26/2023 11:00 AM Raynelle Dick, NP [facility]  05/19/2023 10:00 AM Meriam Sprague, MD [facility]  08/01/2023 11:30 AM Lucky Cowboy, MD [facility]  11/07/2023 11:00 AM Raynelle Dick, NP [facility]  02/06/2024 11:00 AM Lucky Cowboy, MD [facility]    History of Present Illness:     Patient is a very nice WWF presenting with a 2 day hx/o of hoarseness, nonproductive cough & ST. Denies fever , chills, rash or dyspnea.  Covid & Flu A & B - all Negative.      Current Outpatient Medications on File Prior to Visit  Medication Sig   acetaminophen (TYLENOL) 500 MG tablet Take  2 (two) times daily.   ALPRAZolam (XANAX) 0.5 MG tablet TAKE 1/2 TO 1 TAB DAILY ONLY IF NEEDED    Ascorbic Acid (VITAMIN C PO) Take 1 tablet daily.    CALCIUM PO Take 1 tablet daily.   Cholecalciferol (VITAMIN D PO) Take 2 capsules  daily.   citalopram (CELEXA) 20 MG tablet TAKE 1 TABLET EVERY DAY FOR MOOD   FOLIC ACID PO Take 1 tablet daily.    Hydrocortisone Acetate 1 % OINT Apply to Hemorrhoid 3 to 4  x /d   hydrOXYzine (ATARAX) 25 MG tablet Take  1 tablet  3 x /day  as needed for Anxiety   MAGNESIUM PO Take 1 tablet  daily.   metoprolol (TOPROL-XL) 200 MG 24 hr tablet TAKE 1 TABLET EVERY DAY   Multiple Vitamin (MULTIVITAMIN) tablet Take 1 tablet  daily.   Multiple Vitamins-Minerals (ZINC PO) Take 1 tablet  daily.   naproxen sodium (ALEVE) 220 MG tablet Take  daily as needed (pain).   nystatin (MYCOSTATIN) 100000 UNIT/ML suspension Swish 5 mL and retain for as long as possible (several minutes) before swallowing.   pantoprazole (PROTONIX) 40 MG tablet Take 1 tablet Daily  for Indigestion & Acid Reflux   rosuvastatin (CRESTOR) 10 MG tablet Take 1 tablet Daily for Cholesterol   tretinoin (RETIN-A) 0.025 % cream Apply daily as  needed      Allergies  Allergen Reactions   Levsin [Hyoscyamine Sulfate]     unknown   Mobic [Meloxicam]     unknown    Topamax [Topiramate]     unknown    Zoloft [Sertraline Hcl]     unknown    Actonel [Risedronate Sodium] Other (See Comments)    Pt didn't like the way it made her feel    Boniva [Ibandronic Acid] Other (See Comments)    Pt didn't like the way it made her feel   Evista [Raloxifene] Other (See Comments)    Pt didn't like the way it made her feel   Fosamax [Alendronate Sodium] Other (See Comments)    Pt didn't like the way it made her feel      Problem list She has Osteoporosis; DI (detrusor instability); Atrophic vaginitis; LBBB (left bundle branch block); CHB (complete heart block); Vitamin D deficiency; Abnormal glucose; Essential hypertension; Hyperlipidemia, mixed; Medication management; Overweight (BMI 25.0-29.9); Hypertrophic obstructive cardiomyopathy; GERD (gastroesophageal reflux disease); Major depressive disorder, recurrent episode, mild with anxious distress; Pacemaker; Paroxysmal atrial fibrillation; Chronic anticoagulation; Familial tremor; Episodic tension-type headache, not intractable; Cervical spinal meningocele; HOCM (hypertrophic obstructive cardiomyopathy); Thoracic aortic aneurysm (HCC) by Chest CTA 12/2020; Former smoker; FHx: heart disease; and Tenderness of both temporomandibular joints on  their problem list.   Observations/Objective:  BP 138/80   Pulse 62   Temp 97.9 F (36.6 C)   Resp 17   Ht 5\' 5"  (1.651 m)   Wt 175 lb (79.4 kg)   SpO2 99%   BMI 29.12 kg/m   Brassy barking cough. No stridor.   HEENT - WNL. O/P - clear  Neck - supple.  Chest -  Few scattered fine inspir rales & forced post tussive wheezes . No Rhonchi.  Cor - Nl HS. RRR w/o sig MGR. PP 1(+). No edema. MS- FROM w/o deformities.  Gait Nl. Neuro -  Nl w/o focal abnormalities.  Assessment and Plan:  1. Tracheobronchitis  - azithromycin ( 250 MG tablet;   Take 2 tablets with Food on  Day 1, then 1 tablet Daily Dispense: 6 each; Refill: 1  - promethazine -DM) 6.25-15 MG/5ML syrup;  Take 1 tsp every 4 hours if needed for cough   Dispense: 240 mL; Refill: 1  2. Mild persistent asthmatic bronchitis with acute exacerbation  - promethazine- DM) 6.25-15 MG/5ML syrup;  Take 1 tsp every 4 hours if needed for cough   Dispense: 240 mL; Refill: 1  - dexamethasone  4 MG tablet;  Take 1 tab 3 x day - 3 days, then 2 x day - 3 days, then 1 tab daily   Dispense: 20 tablet; Refill: 0  3. Cough headache  - POC Influenza A&B (Binax test)   - > Negative - POC COVID-19   -  > Negative  Follow Up Instructions:        I discussed the assessment and treatment plan with the patient. The patient was provided an opportunity to ask questions and all were answered. The patient agreed with the plan and demonstrated an understanding of the instructions.       The patient was advised to call back or seek an in-person evaluation if the symptoms worsen or if the condition fails to improve as anticipated.    Marinus Maw, MD

## 2023-02-15 NOTE — Patient Instructions (Signed)
Acute Bronchitis  Acute bronchitis is sudden inflammation of the main airways (bronchi) that come off the windpipe (trachea) in the lungs. The swelling causes the airways to get smaller and make more mucus than normal. This can make it hard to breathe and can cause coughing or noisy breathing (wheezing). Acute bronchitis may last several weeks. The cough may last longer. Allergies, asthma, and exposure to smoke may make the condition worse. What are the causes? This condition can be caused by germs and by substances that irritate the lungs, including: Cold and flu viruses. The most common cause of this condition is the virus that causes the common cold. Bacteria. This is less common. Breathing in substances that irritate the lungs, including: Smoke from cigarettes and other forms of tobacco. Dust and pollen. Fumes from household cleaning products, gases, or burned fuel. Indoor or outdoor air pollution. What increases the risk? The following factors may make you more likely to develop this condition: A weak body's defense system, also called the immune system. A condition that affects your lungs and breathing, such as asthma. What are the signs or symptoms? Common symptoms of this condition include: Coughing. This may bring up clear, yellow, or green mucus from your lungs (sputum). Wheezing. Runny or stuffy nose. Having too much mucus in your lungs (chest congestion). Shortness of breath. Aches and pains, including sore throat or chest. How is this diagnosed? This condition is usually diagnosed based on: Your symptoms and medical history. A physical exam. You may also have other tests, including tests to rule out other conditions, such as pneumonia. These tests include: A test of lung function. Test of a mucus sample to look for the presence of bacteria. Tests to check the oxygen level in your blood. Blood tests. Chest X-ray. How is this treated? Most cases of acute bronchitis  clear up over time without treatment. Your health care provider may recommend: Drinking more fluids to help thin your mucus so it is easier to cough up. Taking inhaled medicine (inhaler) to improve air flow in and out of your lungs. Using a vaporizer or a humidifier. These are machines that add water to the air to help you breathe better. Taking a medicine that thins mucus and clears congestion (expectorant). Taking a medicine that prevents or stops coughing (cough suppressant). It is not common to take an antibiotic medicine for this condition. Follow these instructions at home: Take over-the-counter and prescription medicines only as told by your health care provider. Use an inhaler, vaporizer, or humidifier as told by your health care provider. Take two teaspoons (10 mL) of honey at bedtime to lessen coughing at night. Drink enough fluid to keep your urine pale yellow. Do not use any products that contain nicotine or tobacco. These products include cigarettes, chewing tobacco, and vaping devices, such as e-cigarettes. If you need help quitting, ask your health care provider. Get plenty of rest. Return to your normal activities as told by your health care provider. Ask your health care provider what activities are safe for you. Keep all follow-up visits. This is important. How is this prevented? To lower your risk of getting this condition again: Wash your hands often with soap and water for at least 20 seconds. If soap and water are not available, use hand sanitizer. Avoid contact with people who have cold symptoms. Try not to touch your mouth, nose, or eyes with your hands. Avoid breathing in smoke or chemical fumes. Breathing smoke or chemical fumes will make your condition  worse. Get the flu shot every year. Contact a health care provider if: Your symptoms do not improve after 2 weeks. You have trouble coughing up the mucus. Your cough keeps you awake at night. You have a fever. Get  help right away if you: Cough up blood. Feel pain in your chest. Have severe shortness of breath. Faint or keep feeling like you are going to faint. Have a severe headache. Have a fever or chills that get worse. These symptoms may represent a serious problem that is an emergency. Do not wait to see if the symptoms will go away. Get medical help right away. Call your local emergency services (911 in the [place] Do not drive yourself to the hospital. Summary Acute bronchitis is inflammation of the main airways (bronchi) that come off the windpipe (trachea) in the lungs. The swelling causes the airways to get smaller and make more mucus than normal. Drinking more fluids can help thin your mucus so it is easier to cough up. Take over-the-counter and prescription medicines only as told by your health care provider. Do not use any products that contain nicotine or tobacco. These products include cigarettes, chewing tobacco, and vaping devices, such as e-cigarettes. If you need help quitting, ask your health care provider. Contact a health care provider if your symptoms do not improve after 2 weeks.

## 2023-02-16 ENCOUNTER — Other Ambulatory Visit: Payer: Self-pay | Admitting: Internal Medicine

## 2023-02-16 DIAGNOSIS — F419 Anxiety disorder, unspecified: Secondary | ICD-10-CM

## 2023-02-21 ENCOUNTER — Encounter: Payer: Self-pay | Admitting: Internal Medicine

## 2023-02-21 ENCOUNTER — Other Ambulatory Visit: Payer: Self-pay | Admitting: Internal Medicine

## 2023-02-21 DIAGNOSIS — R110 Nausea: Secondary | ICD-10-CM

## 2023-02-21 MED ORDER — ONDANSETRON HCL 8 MG PO TABS
ORAL_TABLET | ORAL | 0 refills | Status: DC
Start: 2023-02-21 — End: 2023-05-31

## 2023-02-24 ENCOUNTER — Ambulatory Visit (INDEPENDENT_AMBULATORY_CARE_PROVIDER_SITE_OTHER): Payer: Medicare PPO

## 2023-02-24 DIAGNOSIS — I442 Atrioventricular block, complete: Secondary | ICD-10-CM | POA: Diagnosis not present

## 2023-02-24 LAB — CUP PACEART REMOTE DEVICE CHECK
Battery Remaining Longevity: 99 mo
Battery Remaining Percentage: 95.5 %
Battery Voltage: 3.01 V
Brady Statistic AP VP Percent: 98 %
Brady Statistic AP VS Percent: 1 %
Brady Statistic AS VP Percent: 2.4 %
Brady Statistic AS VS Percent: 1 %
Brady Statistic RA Percent Paced: 97 %
Brady Statistic RV Percent Paced: 99 %
Date Time Interrogation Session: 20240419063450
Implantable Lead Connection Status: 753985
Implantable Lead Connection Status: 753985
Implantable Lead Implant Date: 20140805
Implantable Lead Implant Date: 20140805
Implantable Lead Location: 753859
Implantable Lead Location: 753860
Implantable Lead Model: 1944
Implantable Lead Model: 1948
Implantable Pulse Generator Implant Date: 20240119
Lead Channel Impedance Value: 530 Ohm
Lead Channel Impedance Value: 580 Ohm
Lead Channel Pacing Threshold Amplitude: 0.5 V
Lead Channel Pacing Threshold Amplitude: 0.75 V
Lead Channel Pacing Threshold Pulse Width: 0.2 ms
Lead Channel Pacing Threshold Pulse Width: 0.5 ms
Lead Channel Sensing Intrinsic Amplitude: 1.9 mV
Lead Channel Setting Pacing Amplitude: 2 V
Lead Channel Setting Pacing Amplitude: 2 V
Lead Channel Setting Pacing Pulse Width: 0.5 ms
Lead Channel Setting Sensing Sensitivity: 4 mV
Pulse Gen Model: 2272
Pulse Gen Serial Number: 8145790

## 2023-02-27 ENCOUNTER — Ambulatory Visit: Payer: Medicare PPO | Attending: Cardiology | Admitting: Cardiology

## 2023-02-27 ENCOUNTER — Telehealth: Payer: Self-pay | Admitting: Pharmacist

## 2023-02-27 ENCOUNTER — Encounter: Payer: Self-pay | Admitting: Cardiology

## 2023-02-27 VITALS — BP 126/70 | HR 66 | Ht 65.0 in | Wt 173.0 lb

## 2023-02-27 DIAGNOSIS — I442 Atrioventricular block, complete: Secondary | ICD-10-CM | POA: Diagnosis not present

## 2023-02-27 DIAGNOSIS — I10 Essential (primary) hypertension: Secondary | ICD-10-CM

## 2023-02-27 DIAGNOSIS — I480 Paroxysmal atrial fibrillation: Secondary | ICD-10-CM

## 2023-02-27 LAB — CUP PACEART INCLINIC DEVICE CHECK
Battery Remaining Longevity: 91 mo
Battery Voltage: 3.01 V
Brady Statistic RA Percent Paced: 97 %
Brady Statistic RV Percent Paced: 99.91 %
Date Time Interrogation Session: 20240422142829
Implantable Lead Connection Status: 753985
Implantable Lead Connection Status: 753985
Implantable Lead Implant Date: 20140805
Implantable Lead Implant Date: 20140805
Implantable Lead Location: 753859
Implantable Lead Location: 753860
Implantable Lead Model: 1944
Implantable Lead Model: 1948
Implantable Pulse Generator Implant Date: 20240119
Lead Channel Impedance Value: 587.5 Ohm
Lead Channel Impedance Value: 600 Ohm
Lead Channel Pacing Threshold Amplitude: 0.5 V
Lead Channel Pacing Threshold Amplitude: 0.5 V
Lead Channel Pacing Threshold Amplitude: 1.5 V
Lead Channel Pacing Threshold Amplitude: 1.5 V
Lead Channel Pacing Threshold Pulse Width: 0.5 ms
Lead Channel Pacing Threshold Pulse Width: 0.5 ms
Lead Channel Pacing Threshold Pulse Width: 0.8 ms
Lead Channel Pacing Threshold Pulse Width: 0.8 ms
Lead Channel Sensing Intrinsic Amplitude: 3.9 mV
Lead Channel Setting Pacing Amplitude: 2 V
Lead Channel Setting Pacing Amplitude: 2.5 V
Lead Channel Setting Pacing Pulse Width: 0.5 ms
Lead Channel Setting Sensing Sensitivity: 4 mV
Pulse Gen Model: 2272
Pulse Gen Serial Number: 8145790

## 2023-02-27 NOTE — Progress Notes (Signed)
HC reviewing pts chart prior to general review call. Reviewing office visits, consults, hospital visits, lab and medication adherence/changes.  Chart Review Have there been any documented new, changed, or discontinued medications since last visit?  (Include name, dose, frequency, date): 03/18/24[name] (PCP)- started hydroxyzine HCl  tid prn for anxiety. 03/19/24[name] (PCP)- started rosuvastatin  once a day. 04/10/24[name] (PCP)- started azithromycin  2 tabs day 1 then qd for 4 days, started dexamethasone  TID for 3 days, then BID for 3 days then QD, started promethazine-DM 6.25-15mg /5ML 1 tsp q4hr prn. Has there been any documented recent hospitalizations or ED visits since last visit with Clinical Lead?: Yes Brief Summary (including Medication and/or Diagnosis changes):: 11/25/22-Dr. Camnitz-[facility]-PPM Generator changeout procedure performed. Adherence Review Does the [facility] have access to medication refill data?: Yes Adherence rates for STAR metric medications: Rosuvastatin - 01/25/23 90DS Adherence rates for medications indicated for disease state being reviewed: Metoprolol - 02/02/23 90DS, 11/17/22 90DS Disease State Questions Able to connect with the Patient?: Yes Did patient have any problems with their health recently?: Yes Note Problems and Concerns:: Patient was seen 02/15/23 for Bronchitis and had to stop taking the dexamethasone she was prescribed because it caused an upset stomach. Pt states she is doing much better now. Have you had any admissions or emergency room visits or worsening of your condition(s) since last visit?: No Have you had any visits with new specialists or providers since your last visit?: No Have you had any new health care problem(s) since your last visit?: No Have you run out of any of your medications since you last spoke with your clinical lead?: No Are there any medications you are not taking as prescribed?: No Are  you having any issues or side effects with your medications?: Yes Note issues or side effects:: Dexamethasone was causing an upset stomach but she stopped the medication and the side effects subsided. Do you have any other health concerns or questions you want to discuss with your Clinical lead  before your next visit?: No Are there any health concerns that you feel we can do a better job addressing?: No Any falls since last visit?: No Any increased or uncontrolled pain since last visit?: No Next visit Type:: Phone Visit with:: CRN Date:: 03/24/2023  Total time ( )

## 2023-02-27 NOTE — Patient Instructions (Signed)
Medication Instructions:  Your physician recommends that you continue on your current medications as directed. Please refer to the Current Medication list given to you today.  *If you need a refill on your cardiac medications before your next appointment, please call your pharmacy*   Lab Work: None ordered   Testing/Procedures: None ordered   Follow-Up: At [facility], you and your health needs are our priority.  As part of our continuing mission to provide you with exceptional heart care, we have created designated Provider Care Teams.  These Care Teams include your primary Cardiologist (physician) and Advanced Practice Providers (APPs -  Physician Assistants and Nurse Practitioners) who all work together to provide you with the care you need, when you need it.  Remote monitoring is used to monitor your Pacemaker or ICD from home. This monitoring reduces the number of office visits required to check your device to one time per year. It allows [place] to keep an eye on the functioning of your device to ensure it is working properly. You are scheduled for a device check from home on 05/26/2023. You may send your transmission at any time that day. If you have a wireless device, the transmission will be sent automatically. After your physician reviews your transmission, you will receive a postcard with your next transmission date.  Your next appointment:   1 year(s)  The format for your next appointment:   In Person  Provider:   You will see one of the following Advanced Practice Providers on your designated Care Team:   Francis Dowse, [place] Casimiro Needle "Mardelle Matte" Lanna Poche, [place]  Thank you for choosing [facility]!!   Dory Horn, RN [phone]

## 2023-02-27 NOTE — Progress Notes (Signed)
Electrophysiology Office Note   Date:  02/27/2023   ID:  Helma, Argyle 1941-05-19, MRN [id number]  PCP:  Lucky Cowboy, MD  Cardiologist:  Shari Prows Primary Electrophysiologist:  Crestina Strike Jorja Loa, MD    Chief Complaint: AF   History of Present Illness: Alexandra Wheeler is a 82 y.o. female who is being seen today for the evaluation of AF at the request of Lucky Cowboy, MD. Presenting today for electrophysiology evaluation.  She has a history significant for atrial fibrillation, complete heart block, hypertension, hyperlipidemia, hypertrophic cardiomyopathy.  She is post [facility] Jude dual-chamber pacemaker implanted 03/11/2013 with generator change 06/12/2023.  Today, denies symptoms of palpitations, chest pain, shortness of breath, orthopnea, PND, lower extremity edema, claudication, dizziness, presyncope, syncope, bleeding, or neurologic sequela. The patient is tolerating medications without difficulties.     Past Medical History:  Diagnosis Date   Anxiety    Atrophic vaginitis    Chest pain    a. Adenosine sestamibi (1/14) with no evidence of ischemia or infarction, EF 63%.    Colon polyps    Complete heart block 06-2013   status post pacemaker implantation by Dr Johney Frame 06-11-2013   Depression    DI (detrusor instability)    GERD (gastroesophageal reflux disease)    History + Hyplori via EGD   H/O echocardiogram    a. Echo 11/2012:EF 50-55%, focal basal septal hypertrophy with some mitral valve SAM (mild) and MR.  This may be a sigmoid septum with advanced age or could be a variant of hypertrophic cardiomyopathy.   HOCM (hypertrophic obstructive cardiomyopathy)    Echocardiogram 2/22: EF 60-65, no RWMA, severe asymmetric septal LVH, no LVOT, Gr 1 DD, mild SAM, mod MR, mild LAE, mod to severe TR, Asc Ao 42 mm, normal RVSF, RVSP 28   HTN (hypertension)    Hyperlipidemia    LBBB (left bundle branch block)    Leukopenia 05/19/2014   Lymphocytosis 05/19/2014    Migraines    Myoview    Myoview 12/21: EF 69, no ischemia or infarction; low risk    Osteoporosis    Paroxysmal atrial fibrillation    Prediabetes    Thoracic aortic aneurysm    Chest CTA 2/22: Ascending thoracic aortic aneurysm 44 mm; Ao arch 34 mm>>repeat 1 year   Vitamin D deficiency    Past Surgical History:  Procedure Laterality Date   BREAST BIOPSY Left 07/25/2019   ANGIOLIPOMA   BREAST EXCISIONAL BIOPSY Right    BREAST SURGERY     Breast cyst   CATARACT EXTRACTION Left 2019   Dr. Dione Booze   HEMORRHOID SURGERY     LEFT HEART CATHETERIZATION WITH CORONARY ANGIOGRAM N/A 06/11/2013   Procedure: LEFT HEART CATHETERIZATION WITH CORONARY ANGIOGRAM;  Surgeon: Kathleene Hazel, MD;  Location[facility] CATH LAB;  Service: Cardiovascular;  Laterality: N/A;   PACEMAKER INSERTION  06-11-2013   STJ dual chamber pacemaker implanted by Dr Johney Frame for complete heart block   PERMANENT PACEMAKER INSERTION Left 06/11/2013   Procedure: PERMANENT PACEMAKER INSERTION;  Surgeon: Kathleene Hazel, MD;  Location: [facility] CATH LAB;  Service: Cardiovascular;  Laterality: Left;   PERMANENT PACEMAKER INSERTION N/A 06/11/2013   Procedure: PERMANENT PACEMAKER INSERTION;  Surgeon: Hillis Range, MD;  Location: [facility] CATH LAB;  Service: Cardiovascular;  Laterality: N/A;   PPM GENERATOR CHANGEOUT N/A 11/25/2022   Procedure: PPM GENERATOR CHANGEOUT;  Surgeon: Regan Lemming, MD;  Location: [facility] INVASIVE CV LAB;  Service: Cardiovascular;  Laterality: N/A;   TEMPORARY PACEMAKER INSERTION  N/A 06/11/2013   Procedure: TEMPORARY PACEMAKER INSERTION;  Surgeon: Kathleene Hazel, MD;  Location: [facility] CATH LAB;  Service: Cardiovascular;  Laterality: N/A;   TUBAL LIGATION       Current Outpatient Medications  Medication Sig Dispense Refill   acetaminophen (TYLENOL) 500 MG tablet Take 500 mg by mouth 2 (two) times daily.     Alcohol Swabs (B-D SINGLE USE SWABS REGULAR) PADS Use as directed for Glucose monitoring Daily 100  each 3   Ascorbic Acid (VITAMIN C PO) Take 1 tablet by mouth daily.      CALCIUM PO Take 1 tablet by mouth daily.     Cholecalciferol (VITAMIN D PO) Take 2 capsules by mouth daily.     citalopram (CELEXA) 20 MG tablet TAKE 1 TABLET EVERY DAY FOR MOOD 90 tablet 3   FOLIC ACID PO Take 1 tablet by mouth daily.      Hydrocortisone Acetate 1 % OINT Apply to Hemorrhoid 3 to 4  x /day (Patient taking differently: Apply 1 Application topically 4 (four) times daily as needed (hemorrhoid).) 60 g 3   hydrOXYzine (ATARAX) 25 MG tablet TAKE 1 TABLET 3 X /DAY AS NEEDED FOR ANXIETY 270 tablet 1   MAGNESIUM PO Take 1 tablet by mouth daily.     metoprolol (TOPROL-XL) 200 MG 24 hr tablet TAKE 1 TABLET EVERY DAY FOR BLOOD PRESSURE AND HEART 90 tablet 3   Multiple Vitamin (MULTIVITAMIN) tablet Take 1 tablet by mouth daily.     Multiple Vitamins-Minerals (ZINC PO) Take 1 tablet by mouth daily.     ondansetron (ZOFRAN) 8 MG tablet Take   1 tablet   3 x / day   (every 6-8 hours)   as needed for Nausea 30 tablet 0   pantoprazole (PROTONIX) 40 MG tablet Take 1 tablet Daily with Supper for Indigestion & Acid Reflux (Patient taking differently: Take 40 mg by mouth daily as needed (acid reflux).) 90 tablet 3   promethazine-dextromethorphan (PROMETHAZINE-DM) 6.25-15 MG/5ML syrup Take 1 tsp every 4 hours if needed for cough 240 mL 1   rosuvastatin (CRESTOR) 10 MG tablet Take 1 tablet Daily for Cholesterol 90 tablet 3   tretinoin (RETIN-A) 0.025 % cream Please specify directions, refills and quantity (Patient taking differently: Apply 1 Application topically daily as needed (wrinkles). Please specify directions, refills and quantity) 45 g 1   ALPRAZolam (XANAX) 0.5 MG tablet TAKE 1/2 TO 1 TAB DAILY ONLY IF NEEDED FOR SLEEP OR TREMOR AND LIMIT TO 5 DAYS /WEEK TO AVOID ADDICTION AND DEMENTIA (Patient not taking: Reported on 02/15/2023) 20 tablet 0   azithromycin (ZITHROMAX) 250 MG tablet Take 2 tablets with Food on  Day 1, then 1  tablet Daily with Food for Sinusitis / Bronchitis (Patient not taking: Reported on 02/27/2023) 6 each 1   dexamethasone (DECADRON) 4 MG tablet Take 1 tab 3 x day - 3 days, then 2 x day - 3 days, then 1 tab daily (Patient not taking: Reported on 02/27/2023) 20 tablet 0   naproxen sodium (ALEVE) 220 MG tablet Take 220 mg by mouth daily as needed (pain). (Patient not taking: Reported on 02/27/2023)     nystatin (MYCOSTATIN) 100000 UNIT/ML suspension Swish 5 mL in the mouth and retain for as long as possible (several minutes) before swallowing. (Patient not taking: Reported on 02/15/2023) 60 mL 0   No current facility-administered medications for this visit.    Allergies:   Levsin [hyoscyamine sulfate], Mobic [meloxicam], Topamax [topiramate], Zoloft [sertraline hcl],  Actonel [risedronate sodium], Boniva [ibandronic acid], Evista [raloxifene], and Fosamax [alendronate sodium]   Social History:  The patient  reports that she quit smoking about 47 years ago. Her smoking use included cigarettes. She has never used smokeless tobacco. She reports that she does not drink alcohol and does not use drugs.   Family History:  The patient's family history includes Aneurysm in her brother; Breast cancer (age of onset: 21) in her sister; Cancer in her father; Diabetes in her brother, brother, and mother; Heart disease in her brother, brother, and mother; Hypertension in her brother; Stroke in her brother.   ROS:  Please see the history of present illness.   Otherwise, review of systems is positive for none.   All other systems are reviewed and negative.   PHYSICAL EXAM: VS:  BP 126/70   Pulse 66   Ht 5\' 5"  (1.651 m)   Wt 173 lb (78.5 kg)   SpO2 98%   BMI 28.79 kg/m  , BMI Body mass index is 28.79 kg/m. GEN: Well nourished, well developed, in no acute distress  HEENT: normal  Neck: no JVD, carotid bruits, or masses Cardiac: RRR; no murmurs, rubs, or gallops,no edema  Respiratory:  clear to auscultation  bilaterally, normal work of breathing GI: soft, nontender, nondistended, + BS MS: no deformity or atrophy  Skin: warm and dry, device site well healed Neuro:  Strength and sensation are intact Psych: euthymic mood, full affect  EKG:  EKG is ordered today. Personal review of the ekg ordered shows sinus rhythm, V paced  Personal review of the device interrogation today. Results in [facility]    Recent Labs: 01/23/2023: ALT 17; BUN 13; Creat 0.79; Hemoglobin 12.7; Magnesium 2.1; Platelets 203; Potassium 4.3; Sodium 142; TSH 0.76    Lipid Panel     Component Value Date/Time   CHOL 217 (H) 01/23/2023 1540   TRIG 113 01/23/2023 1540   HDL 54 01/23/2023 1540   CHOLHDL 4.0 01/23/2023 1540   VLDL 20 04/11/2017 1025   LDLCALC 140 (H) 01/23/2023 1540     Wt Readings from Last 3 Encounters:  02/27/23 173 lb (78.5 kg)  02/15/23 175 lb (79.4 kg)  01/23/23 173 lb 9.6 oz (78.7 kg)      Other studies Reviewed: Additional studies/ records that were reviewed today include: TTE 05/19/22  Review of the above records today demonstrates:   1. Left ventricular ejection fraction, by estimation, is 60 to 65%. The  left ventricle has normal function. The left ventricle has no regional  wall motion abnormalities. There is moderate asymmetric left ventricular  hypertrophy of the basal-septal  segment. Left ventricular diastolic parameters are consistent with Grade I  diastolic dysfunction (impaired relaxation). The average left ventricular  global longitudinal strain is -17.2 % (borderline)   2. Right ventricular systolic function is normal. The right ventricular  size is not well visualized. There is mildly elevated pulmonary artery  systolic pressure.   3. The mitral valve is grossly normal. Mild mitral valve regurgitation.   4. The aortic valve is tricuspid. Aortic valve regurgitation is trivial.  No aortic stenosis is present.   5. Aortic ascending aortic aneurysm (45 mm).   6. Left atrial  size was mildly dilated.   7. The inferior vena cava is normal in size with greater than 50%  respiratory variability, suggesting right atrial pressure of 3 mmHg.    ASSESSMENT AND PLAN:  1.  Complete heart block: Status post [facility] Jude dual-chamber pacemaker with generator  change 11/25/2022.  Device functioning appropriately.  Sensing, threshold, impedance within normal limits.  No changes.  2.  Paroxysmal atrial fibrillation: CHA2DS2-VASc of 4.  Burden less than 1% on device interrogation.  Patient prefers to avoid anticoagulation.  3.  Hypertension: well controlled  4.  Hypertrophic cardiomyopathy: No evidence of obstruction on most recent echo.  Plan per primary cardiology.   Current medicines are reviewed at length with the patient today.   The patient does not have concerns regarding her medicines.  The following changes were made today:  none  Labs/ tests ordered today include:  Orders Placed This Encounter  Procedures   EKG 12-Lead     Disposition:   FU 12 months  Signed, Temple Sporer Jorja Loa, MD  02/27/2023 2:28 PM     [facility] [address] [place] [address] [phone] (office) [phone] (fax)

## 2023-03-06 ENCOUNTER — Other Ambulatory Visit: Payer: Self-pay | Admitting: Internal Medicine

## 2023-03-07 DIAGNOSIS — I10 Essential (primary) hypertension: Secondary | ICD-10-CM | POA: Diagnosis not present

## 2023-03-07 DIAGNOSIS — E782 Mixed hyperlipidemia: Secondary | ICD-10-CM | POA: Diagnosis not present

## 2023-03-09 ENCOUNTER — Other Ambulatory Visit: Payer: Self-pay

## 2023-03-09 DIAGNOSIS — Z1211 Encounter for screening for malignant neoplasm of colon: Secondary | ICD-10-CM | POA: Diagnosis not present

## 2023-03-09 DIAGNOSIS — Z1212 Encounter for screening for malignant neoplasm of rectum: Secondary | ICD-10-CM | POA: Diagnosis not present

## 2023-03-09 LAB — POC HEMOCCULT BLD/STL (HOME/3-CARD/SCREEN)
Card #2 Fecal Occult Blod, POC: NEGATIVE
Card #3 Fecal Occult Blood, POC: NEGATIVE
Fecal Occult Blood, POC: NEGATIVE

## 2023-03-24 NOTE — Progress Notes (Signed)
Remote pacemaker transmission.   

## 2023-04-07 ENCOUNTER — Telehealth: Payer: Self-pay | Admitting: *Deleted

## 2023-04-10 ENCOUNTER — Telehealth: Payer: Self-pay | Admitting: *Deleted

## 2023-04-10 NOTE — Telephone Encounter (Signed)
Focused Nurse Outreach  [facility]  81 years, Female  DOB: 1941/09/05  M:   __________________________________________________ Call Outreach Engagement Notes Maryellen Pile on 04/05/2023 02:02 PM Focused nurse outreach prep started. Reviewing office visits, specialist's visits, hospital visits, adherence, medications, and labs. ( )  No recent ED visits or hospitalizations.  Patient Visit  Details Date visit completed: 04/10/2023  Type of visit:: Phone  Visit Details::  1. HTN: Patient does monitor her BP at home. It has been ranging 120s-130s/80s. She denies any chest pain, shortness of breath, dizziness or headaches. 2. Afib: Rate has been controlled. Denies any palpitations. Continues on Metroprolol daily. 3. Hyperlipidemia: Currently on Crestor. She feels it is causing increased joint and bone pain. Some nights she skips this medication so she won't have increased pain the following day. She will send a message to her PCP to discuss. She has an appointment in office on 6/19. 4. GERD: Denies any acid reflux symptoms at this time so has not been taking the Pantoprazole. Overall patient feels she is doing ok. Biggest issue is pain in her lower back, shoulder and knees. She takes Tylenol daily. With Tylenol her pain is less than 5. Without it her pain is 8/9. She stays very mobile and active. At time of call she is sitting with a heating pad on her back which also helps.  CRN will follow up with patient in 2 months  CRN Chart prep: 4 min CRN Phone visit: 20 min CRN Documentation: 5 min

## 2023-04-26 ENCOUNTER — Other Ambulatory Visit: Payer: Self-pay | Admitting: Nurse Practitioner

## 2023-04-26 ENCOUNTER — Ambulatory Visit (INDEPENDENT_AMBULATORY_CARE_PROVIDER_SITE_OTHER): Payer: Medicare PPO | Admitting: Nurse Practitioner

## 2023-04-26 ENCOUNTER — Telehealth: Payer: Self-pay | Admitting: Nurse Practitioner

## 2023-04-26 ENCOUNTER — Encounter: Payer: Self-pay | Admitting: Nurse Practitioner

## 2023-04-26 VITALS — BP 126/72 | HR 78 | Temp 97.9°F | Resp 16 | Ht 65.0 in | Wt 175.8 lb

## 2023-04-26 DIAGNOSIS — M810 Age-related osteoporosis without current pathological fracture: Secondary | ICD-10-CM

## 2023-04-26 DIAGNOSIS — I442 Atrioventricular block, complete: Secondary | ICD-10-CM

## 2023-04-26 DIAGNOSIS — R6889 Other general symptoms and signs: Secondary | ICD-10-CM

## 2023-04-26 DIAGNOSIS — E782 Mixed hyperlipidemia: Secondary | ICD-10-CM

## 2023-04-26 DIAGNOSIS — R1032 Left lower quadrant pain: Secondary | ICD-10-CM

## 2023-04-26 DIAGNOSIS — F330 Major depressive disorder, recurrent, mild: Secondary | ICD-10-CM | POA: Diagnosis not present

## 2023-04-26 DIAGNOSIS — R7309 Other abnormal glucose: Secondary | ICD-10-CM | POA: Diagnosis not present

## 2023-04-26 DIAGNOSIS — M791 Myalgia, unspecified site: Secondary | ICD-10-CM

## 2023-04-26 DIAGNOSIS — E559 Vitamin D deficiency, unspecified: Secondary | ICD-10-CM | POA: Diagnosis not present

## 2023-04-26 DIAGNOSIS — Z79899 Other long term (current) drug therapy: Secondary | ICD-10-CM | POA: Diagnosis not present

## 2023-04-26 DIAGNOSIS — Z0001 Encounter for general adult medical examination with abnormal findings: Secondary | ICD-10-CM | POA: Diagnosis not present

## 2023-04-26 DIAGNOSIS — I712 Thoracic aortic aneurysm, without rupture, unspecified: Secondary | ICD-10-CM | POA: Diagnosis not present

## 2023-04-26 DIAGNOSIS — I10 Essential (primary) hypertension: Secondary | ICD-10-CM | POA: Diagnosis not present

## 2023-04-26 DIAGNOSIS — N952 Postmenopausal atrophic vaginitis: Secondary | ICD-10-CM

## 2023-04-26 DIAGNOSIS — I447 Left bundle-branch block, unspecified: Secondary | ICD-10-CM

## 2023-04-26 DIAGNOSIS — I421 Obstructive hypertrophic cardiomyopathy: Secondary | ICD-10-CM

## 2023-04-26 DIAGNOSIS — I480 Paroxysmal atrial fibrillation: Secondary | ICD-10-CM | POA: Diagnosis not present

## 2023-04-26 DIAGNOSIS — Z0000 Encounter for general adult medical examination without abnormal findings: Secondary | ICD-10-CM

## 2023-04-26 LAB — COMPLETE METABOLIC PANEL WITH GFR
AG Ratio: 1.7 (calc) (ref 1.0–2.5)
ALT: 16 U/L (ref 6–29)
AST: 14 U/L (ref 10–35)
Albumin: 4 g/dL (ref 3.6–5.1)
Alkaline phosphatase (APISO): 90 U/L (ref 37–153)
BUN: 14 mg/dL (ref 7–25)
CO2: 31 mmol/L (ref 20–32)
Calcium: 9.4 mg/dL (ref 8.6–10.4)
Chloride: 106 mmol/L (ref 98–110)
Creat: 0.81 mg/dL (ref 0.60–0.95)
Globulin: 2.4 g/dL (calc) (ref 1.9–3.7)
Glucose, Bld: 96 mg/dL (ref 65–99)
Potassium: 4.4 mmol/L (ref 3.5–5.3)
Sodium: 141 mmol/L (ref 135–146)
Total Bilirubin: 0.5 mg/dL (ref 0.2–1.2)
Total Protein: 6.4 g/dL (ref 6.1–8.1)
eGFR: 72 mL/min/{1.73_m2} (ref >=60)

## 2023-04-26 LAB — LIPID PANEL
Cholesterol: 139 mg/dL (ref <200)
HDL: 54 mg/dL (ref >=50)
LDL Cholesterol (Calc): 65 mg/dL (calc)
Non-HDL Cholesterol (Calc): 85 mg/dL (calc) (ref <130)
Total CHOL/HDL Ratio: 2.6 (calc) (ref <5.0)
Triglycerides: 110 mg/dL (ref <150)

## 2023-04-26 LAB — CBC WITH DIFFERENTIAL/PLATELET
Absolute Monocytes: 381 cells/uL (ref 200–950)
Basophils Absolute: 49 cells/uL (ref 0–200)
Basophils Relative: 1.2 %
Eosinophils Absolute: 111 cells/uL (ref 15–500)
Eosinophils Relative: 2.7 %
HCT: 39 % (ref 35.0–45.0)
Hemoglobin: 12.4 g/dL (ref 11.7–15.5)
Lymphs Abs: 1820 cells/uL (ref 850–3900)
MCH: 28.2 pg (ref 27.0–33.0)
MCHC: 31.8 g/dL — ABNORMAL LOW (ref 32.0–36.0)
MCV: 88.8 fL (ref 80.0–100.0)
MPV: 10.6 fL (ref 7.5–12.5)
Monocytes Relative: 9.3 %
Neutro Abs: 1738 cells/uL (ref 1500–7800)
Neutrophils Relative %: 42.4 %
Platelets: 196 10*3/uL (ref 140–400)
RBC: 4.39 10*6/uL (ref 3.80–5.10)
RDW: 12.8 % (ref 11.0–15.0)
Total Lymphocyte: 44.4 %
WBC: 4.1 10*3/uL (ref 3.8–10.8)

## 2023-04-26 LAB — MAGNESIUM: Magnesium: 2.2 mg/dL (ref 1.5–2.5)

## 2023-04-26 NOTE — Progress Notes (Addendum)
MEDICARE ANNUAL WELLNESS VISIT AND FOLLOW UP  Assessment:   Diagnoses and all orders for this visit:  Encounter for Medicare annual wellness exam Continue annually   LBBB (left bundle branch block) Followed by cardiology; s/p pacemaker  Thoracic aortic aneurysm(HCC) Control BP. Weight , cholesterol and blood sugars  Hypertrophic obstructive cardiomyopathy (HCC) Weights stable; followed by cardiology; manage BP  Essential hypertension Continue medications Monitor blood pressure at home; call if consistently over 130/80 Continue DASH diet.   Reminder to go to the ER if any CP, SOB, nausea, dizziness, severe HA, changes vision/speech, left arm numbness and tingling and jaw pain.  CHB (complete heart block) (HCC) S/p pacemaker; followed by cardiology  Major depressive disorder, recurrent episode, mild with anxious distress (HCC) Continue medications Celexa 20 mg qd; has discontinued Xanax Lifestyle discussed: diet/exerise, sleep hygiene, stress management, hydration  Age-related osteoporosis without current pathological fracture - DEXA UTD, continue Vit D and Ca, emphasized weight bearing exercises Couldn't tolerate bisphosphonates  Atrophic vaginitis Monitor; topicals PRN  Paroxysmal atrial fibrillation (HCC) No concerns with excessive bleeding, no falls Pacemaker Followed by cardiology  Vitamin D deficiency Below goal last visit; she did increase dose continue to recommend supplementation for goal of 60-100 Check vitamin D level annually or as needed  Abnormal glucose She is working on weight loss Discussed disease and risks Discussed diet/exercise, weight management  A1C, insulin level routinely   Mixed hyperlipidemia/Statin myalgia Stop Rosuvastatin due to myalgias, start red yeast rice and omega 3's Continue low cholesterol diet and exercise.  - lipid panel.   Gastroesophageal reflux disease, esophagitis presence not specified Well managed by  lifestyle at this time Discussed diet, avoiding triggers and other lifestyle changes  Pacemaker Followed by Cardiology   Medication management -     CBC with Differential/Platelet -     COMPLETE METABOLIC PANEL WITH GFR -     Lipid panel -     Magnesium  Left lower quadrant abdominal pain Intermittent sharp pain x several months, no mass on palpation. Mild LLQ tenderness -     US Abdomen Renal        Over 40 minutes of exam, counseling, chart review and critical decision making was performed Future Appointments  Date Time Provider Department Center  05/19/2023 10:00 AM Meriam Sprague, MD [facility]  05/26/2023  7:00 AM [facility] DEVICE REMOTES [facility]  08/01/2023 11:30 AM Lucky Cowboy, MD [facility] None  08/25/2023  7:00 AM [facility] DEVICE REMOTES [facility]  11/07/2023 11:00 AM Raynelle Dick, NP [facility] None  11/24/2023  7:00 AM [facility] DEVICE REMOTES [facility]  02/06/2024 11:00 AM Lucky Cowboy, MD [facility] None  02/23/2024  7:00 AM [facility] DEVICE REMOTES [facility]  04/25/2024 11:00 AM Raynelle Dick, NP [facility] None  05/24/2024  7:00 AM [facility] DEVICE REMOTES [facility]  08/23/2024  7:00 AM [facility] DEVICE REMOTES [facility]  11/22/2024  7:00 AM [facility] DEVICE REMOTES [facility]     Plan:   During the course of the visit the patient was educated and counseled about appropriate screening and preventive services including:   Pneumococcal vaccine  Prevnar 13 Influenza vaccine Td vaccine Screening electrocardiogram Bone densitometry screening Colorectal cancer screening Diabetes screening Glaucoma screening Nutrition counseling  Advanced directives: requested   Subjective:  Alexandra Wheeler is a 82 y.o. female who presents for Medicare Annual Wellness Visit and 3 month follow up.   In 2014, she  had a PPM implanted for CHB. She  has an echocardiogram that suggests hypertrophic cardiomyopathy with asymmetric septal hypertrophy and mitral valve SAM with mild MR. She had negative cath. Followed by Dr. Lauretta Grill and newly with a. Fib clinic. She is no longer taking Eliquis. She had pacemaker changed 11/25/22 by Dr. Elberta Fortis She denies CP, palpitations, fatigue, exercise intolerance, edema. She has appt next month on 05/19/23. She does have pain where pacemaker was inserted.  She was previously following with Dr. Regino Schultze she has headaches related to cervical arthritis, Will occasionally have pain in right hip and knees.   she has a diagnosis of depression/anxiety and is currently on celexa 20 mg every day and symptoms are controlled Has not been using Xanax, last filled Xanax 0.5 mg #20 on 12/15/22  She is getting a sharp pain LLQ of abdomen that is intermittent and last only a short period.  This has been occurring off and on x 2-3 months.   BMI is Body mass index is 29.25 kg/m., she has been working on diet, cutting starches and sweets, exercise limited due to chronic pain.She is up 8 pounds from last year. Exercising walking 15 minutes daily Wt Readings from Last 3 Encounters:  04/26/23 175 lb 12.8 oz (79.7 kg)  02/27/23 173 lb (78.5 kg)  02/15/23 175 lb (79.4 kg)    Her blood pressure has been controlled at home, taking Metoprolol 200 mg daily,today their BP is BP: 126/72  BP Readings from Last 3 Encounters:  04/26/23 126/72  02/27/23 126/70  02/15/23 138/80  She does not workout. She denies chest pain, shortness of breath, dizziness.    She is on cholesterol medication Rosuvastatin 10 mg but taking once a week due to bad myalgias. Pain is much better when not taking statin.  Her cholesterol is not at goal. She is limiting red meat, dairy and eggs.  The cholesterol last visit was:   Lab Results  Component Value Date   CHOL 217 (H) 01/23/2023   HDL 54 01/23/2023   LDLCALC 140 (H)  01/23/2023   TRIG 113 01/23/2023   CHOLHDL 4.0 01/23/2023    She has been working on diet and exercise for prediabetes, and denies increased appetite, nausea, paresthesia of the feet, polydipsia, polyuria and visual disturbances. Limits white bread, white rice, pasta, potatoes, sugar. Last A1C in the office was:  Lab Results  Component Value Date   HGBA1C 5.8 (H) 01/23/2023   Last GFR: Lab Results  Component Value Date   EGFR 75 01/23/2023    Patient is on Vitamin D supplement and near goal of 60 at recent check; she has increased up to 1000 IU daily   Lab Results  Component Value Date   VD25OH 85 01/23/2023      Medication Review: Current Outpatient Medications on File Prior to Visit  Medication Sig Dispense Refill   acetaminophen (TYLENOL) 500 MG tablet Take 500 mg by mouth 2 (two) times daily.     Alcohol Swabs (B-D SINGLE USE SWABS REGULAR) PADS Use as directed for Glucose monitoring Daily 100 each 3   Ascorbic Acid (VITAMIN C PO) Take 1 tablet by mouth daily.      CALCIUM PO Take 1 tablet by mouth daily.     Cholecalciferol (VITAMIN D PO) Take 2 capsules by mouth daily.     citalopram (CELEXA) 20 MG tablet TAKE 1 TABLET EVERY DAY FOR MOOD 90 tablet 3   FOLIC ACID PO Take 1 tablet by mouth daily.      hydrocortisone 1 % ointment  APPLY TO HEMORRHOID 3 TO 4 TIMES A DAY 56 g 4   Hydrocortisone Acetate 1 % OINT Apply to Hemorrhoid 3 to 4  x /day (Patient taking differently: Apply 1 Application topically 4 (four) times daily as needed (hemorrhoid).) 60 g 3   hydrOXYzine (ATARAX) 25 MG tablet TAKE 1 TABLET 3 X /DAY AS NEEDED FOR ANXIETY 270 tablet 1   MAGNESIUM PO Take 1 tablet by mouth daily.     metoprolol (TOPROL-XL) 200 MG 24 hr tablet TAKE 1 TABLET EVERY DAY FOR BLOOD PRESSURE AND HEART 90 tablet 3   Multiple Vitamin (MULTIVITAMIN) tablet Take 1 tablet by mouth daily.     Multiple Vitamins-Minerals (ZINC PO) Take 1 tablet by mouth daily.     ondansetron (ZOFRAN) 8 MG tablet  Take   1 tablet   3 x / day   (every 6-8 hours)   as needed for Nausea 30 tablet 0   pantoprazole (PROTONIX) 40 MG tablet Take 1 tablet Daily with Supper for Indigestion & Acid Reflux (Patient taking differently: Take 40 mg by mouth daily as needed (acid reflux).) 90 tablet 3   rosuvastatin (CRESTOR) 10 MG tablet Take 1 tablet Daily for Cholesterol 90 tablet 3   tretinoin (RETIN-A) 0.025 % cream Please specify directions, refills and quantity (Patient taking differently: Apply 1 Application topically daily as needed (wrinkles). Please specify directions, refills and quantity) 45 g 1   ALPRAZolam (XANAX) 0.5 MG tablet TAKE 1/2 TO 1 TAB DAILY ONLY IF NEEDED FOR SLEEP OR TREMOR AND LIMIT TO 5 DAYS /WEEK TO AVOID ADDICTION AND DEMENTIA (Patient not taking: Reported on 02/15/2023) 20 tablet 0   No current facility-administered medications on file prior to visit.    Allergies  Allergen Reactions   Levsin [Hyoscyamine Sulfate]     unknown   Mobic [Meloxicam]     unknown    Topamax [Topiramate]     unknown    Zoloft [Sertraline Hcl]     unknown    Actonel [Risedronate Sodium] Other (See Comments)    Pt didn't like the way it made her feel    Boniva [Ibandronic Acid] Other (See Comments)    Pt didn't like the way it made her feel   Evista [Raloxifene] Other (See Comments)    Pt didn't like the way it made her feel   Fosamax [Alendronate Sodium] Other (See Comments)    Pt didn't like the way it made her feel     Current Problems (verified) Patient Active Problem List   Diagnosis Date Noted   Tenderness of both temporomandibular joints 11/30/2021   Former smoker 01/07/2021   FHx: heart disease 01/07/2021   Thoracic aortic aneurysm (HCC) by Chest CTA 12/2020    HOCM (hypertrophic obstructive cardiomyopathy) (HCC)    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   Episodic tension-type headache, not intractable 02/12/2020   Familial tremor 06/10/2019   GERD (gastroesophageal reflux  disease) 02/25/2019   Major depressive disorder, recurrent episode, mild with anxious distress (HCC) 02/25/2019   Pacemaker 02/25/2019   Paroxysmal atrial fibrillation (HCC) 02/25/2019   Chronic anticoagulation 02/25/2019   Hypertrophic obstructive cardiomyopathy (HCC) 09/21/2015   Overweight (BMI 25.0-29.9) 09/01/2015   Vitamin D deficiency    Abnormal glucose    Essential hypertension    Hyperlipidemia, mixed    CHB (complete heart block) (HCC) 06/10/2013   LBBB (left bundle branch block) 11/15/2012   Osteoporosis    DI (detrusor instability)    Atrophic vaginitis  Screening Tests Immunization History  Administered Date(s) Administered   Fluad Quad(high Dose 65+) 07/25/2019   Influenza Split 08/06/2014, 09/20/2017   Influenza, High Dose Seasonal PF 09/01/2015, 08/19/2016, 08/29/2022   Influenza-Unspecified 09/12/2018   PNEUMOCOCCAL CONJUGATE-20 08/29/2022   Pneumococcal Conjugate-13 09/01/2015   Pneumococcal-Unspecified 10/08/2010   Td 05/09/2011   Zoster, Live 05/09/2011   Health Maintenance  Topic Date Due   COVID-19 Vaccine (1) 05/12/2023 (Originally 09/24/1946)   Zoster Vaccines- Shingrix (1 of 2) 07/27/2023 (Originally 09/25/1960)   INFLUENZA VACCINE  06/08/2023   Medicare Annual Wellness (AWV)  04/25/2024   Pneumonia Vaccine 75+ Years old  Completed   DEXA SCAN  Completed   HPV VACCINES  Aged Out   DTaP/Tdap/Td  Discontinued   Mammo 12//20/23,  Dexa 07/20/22 T score - 3.0 osteoporosis  Names of Other Physician/Practitioners you currently use: 1. [facility] here for primary care 2. Dr. Dione Booze, eye doctor, last visit 2021- will schedule appt 3. Dr. Cain Saupe, dentist, last visit 02/2023  Patient Care Team: Lucky Cowboy, MD as PCP - General (Internal Medicine) Meriam Sprague, MD as PCP - Cardiology (Cardiology) Regan Lemming, MD as PCP - Electrophysiology (Cardiology) Lucky Cowboy, MD as Attending  Physician (Internal Medicine) Hillis Range, MD (Inactive) as Consulting Physician (Cardiology) Artis Delay, MD as Consulting Physician (Hematology and Oncology) Bernette Redbird, MD as Consulting Physician (Gastroenterology) Kennon Rounds as Physician Assistant (Cardiology) Sundra Aland, [facility] as Pharmacist (Pharmacist)  SURGICAL HISTORY She  has a past surgical history that includes Breast surgery; Tubal ligation; Hemorrhoid surgery; Pacemaker insertion (06-11-2013); left heart catheterization with coronary angiogram (N/A, 06/11/2013); temporary pacemaker insertion (N/A, 06/11/2013); permanent pacemaker insertion (Left, 06/11/2013); permanent pacemaker insertion (N/A, 06/11/2013); Cataract extraction (Left, 2019); Breast excisional biopsy (Right); Breast biopsy (Left, 07/25/2019); and PPM GENERATOR CHANGEOUT (N/A, 11/25/2022). FAMILY HISTORY Her family history includes Aneurysm in her brother; Breast cancer (age of onset: 43) in her sister; Cancer in her father; Diabetes in her brother, brother, and mother; Heart disease in her brother, brother, and mother; Hypertension in her brother; Stroke in her brother. SOCIAL HISTORY She  reports that she quit smoking about 47 years ago. Her smoking use included cigarettes. She has never used smokeless tobacco. She reports that she does not drink alcohol and does not use drugs.   MEDICARE WELLNESS OBJECTIVES: Physical activity:  walking 15 minutes daily Cardiac risk factors: Cardiac Risk Factors include: dyslipidemia;hypertension;advanced age (>6men, >10 women) Depression/mood screen:      04/26/2023   11:40 AM  Depression screen PHQ 2/9  Decreased Interest 0  Down, Depressed, Hopeless 0  PHQ - 2 Score 0    ADLs:     04/26/2023   11:41 AM 01/22/2023   11:03 PM  In your present state of health, do you have any difficulty performing the following activities:  Hearing? 0 0  Vision? 0 0  Difficulty concentrating or making decisions? 0 0  Walking or  climbing stairs? 1 0  Dressing or bathing? 0 0  Doing errands, shopping? 0 0     Cognitive Testing  Alert? Yes  Normal Appearance?Yes  Oriented to person? Yes  Place? Yes   Time? Yes  Recall of three objects?  Yes  Can perform simple calculations? Yes  Displays appropriate judgment?Yes  Can read the correct time from a watch face?Yes  EOL planning: Does Patient Have a Medical Advance Directive?: Yes Type of Advance Directive: Healthcare Power of Attorney, Living will Does patient want  to make changes to medical advance directive?: No - Patient declined Copy of Healthcare Power of Attorney in Chart?: No - copy requested Would patient like information on creating a medical advance directive?: No - Patient declined  Review of Systems  Constitutional:  Negative for malaise/fatigue and weight loss.  HENT:  Negative for hearing loss and tinnitus.   Eyes:  Negative for blurred vision and double vision.  Respiratory:  Negative for cough, sputum production, shortness of breath and wheezing.   Cardiovascular:  Negative for chest pain, palpitations, orthopnea, claudication, leg swelling and PND.  Gastrointestinal:  Positive for abdominal pain (LLQ). Negative for blood in stool, constipation, diarrhea, heartburn, melena, nausea and vomiting.  Genitourinary: Negative.   Musculoskeletal:  Positive for joint pain (kness and hips), myalgias (since starting statin) and neck pain. Negative for falls.  Skin:  Negative for rash.  Neurological:  Negative for dizziness, tingling, sensory change, weakness and headaches.  Endo/Heme/Allergies:  Negative for polydipsia.  Psychiatric/Behavioral:  Negative for depression, memory loss, substance abuse and suicidal ideas. The patient is nervous/anxious and has insomnia.   All other systems reviewed and are negative.    Objective:     Today's Vitals   04/26/23 1059  BP: 126/72  Pulse: 78  Resp: 16  Temp: 97.9 F (36.6 C)  SpO2: 98%  Weight: 175 lb  12.8 oz (79.7 kg)  Height: 5\' 5"  (1.651 m)   Body mass index is 29.25 kg/m.    General appearance: alert, no distress, WD/WN,  female HEENT: normocephalic, sclerae anicteric, TMs pearly, nares patent, no discharge or erythema, pharynx normal Neck: + temporal tenderness on the right side. supple, no lymphadenopathy, no thyromegaly, no masses Heart: RRR, normal S1, S2, no murmurs Lungs: CTA bilaterally, no wheezes, rhonchi, or rales Abdomen: +bs, soft, non distended, mild tenderness LLQ with deep palpation- no masses Musculoskeletal: normal range of motion, normal sensation, reflexes, and pulses distal. Extremities: no edema, no cyanosis, no clubbing Pulses: 2+ symmetric, upper and lower extremities, normal cap refill Neurological: alert, oriented x 3, CN2-12 intact, strength normal upper extremities and lower extremities, sensation normal throughout, DTRs 2+ throughout, no cerebellar signs, gait normal Psychiatric: normal affect, behavior normal, pleasant   Medicare Attestation I have personally reviewed: The patient's medical and social history Their use of alcohol, tobacco or illicit drugs Their current medications and supplements The patient's functional ability including ADLs,fall risks, home safety risks, cognitive, and hearing and visual impairment Diet and physical activities Evidence for depression or mood disorders  The patient's weight, height, BMI, and visual acuity have been recorded in the chart.  I have made referrals, counseling, and provided education to the patient based on review of the above and I have provided the patient with a written personalized care plan for preventive services.     Raynelle Dick, NP   04/26/2023

## 2023-04-26 NOTE — Patient Instructions (Addendum)
YOU CAN CALL TO MAKE AN ULTRASOUND..  I have put in an order for an ultrasound for you to have You can set them up at your convenience by calling this number [phone] You will likely have the ultrasound at [address] [facility]   If you have any issues call our office and we will set this up for you.    Stop Rosuvastatin Can use Red Yeast Rice and Omega 3 fatty acids  If remains elevated would recommend Zetia  Fair life protein shakes Eat more frequently - try not to go more than 6 hours without protein Aim for 90 grams of protein a day- 30 breakfast/30 lunch 30 dinner Try to keep net carbs less than 50 Net Carbs=Total Carbs-fiber- sugar alcohols Exercise heartrate 120-140(fat burning zone)- walking 20-30 minutes 4 days a week   Preventing High Cholesterol Cholesterol is a white, waxy substance similar to fat that the human body needs to help build cells. The liver makes all the cholesterol that a person's body needs. Having high cholesterol (hypercholesterolemia) increases your risk for heart disease and stroke. Extra or excess cholesterol comes from the food that you eat. High cholesterol can often be prevented with diet and lifestyle changes. If you already have high cholesterol, you can control it with diet, lifestyle changes, and medicines. How can high cholesterol affect me? If you have high cholesterol, fatty deposits (plaques) may build up on the walls of your blood vessels. The blood vessels that carry blood away from your heart are called arteries. Plaques make the arteries narrower and stiffer. This in turn can: Restrict or block blood flow and cause blood clots to form. Increase your risk for heart attack and stroke. What can increase my risk for high cholesterol? This condition is more likely to develop in people who: Eat foods that are high in saturated fat or cholesterol. Saturated fat is mostly found in foods that come from animal sources. Are overweight. Are not  getting enough exercise. Use products that contain nicotine or tobacco, such as cigarettes, e-cigarettes, and chewing tobacco. Have a family history of high cholesterol (familial hypercholesterolemia). What actions can I take to prevent this? Nutrition  Eat less saturated fat. Avoid trans fats (partially hydrogenated oils). These are often found in margarine and in some baked goods, fried foods, and snacks bought in packages. Avoid precooked or cured meat, such as bacon, sausages, or meat loaves. Avoid foods and drinks that have added sugars. Eat more fruits, vegetables, and whole grains. Choose healthy sources of protein, such as fish, poultry, lean cuts of red meat, beans, peas, lentils, and nuts. Choose healthy sources of fat, such as: Nuts. Vegetable oils, especially olive oil. Fish that have healthy fats, such as omega-3 fatty acids. These fish include mackerel or salmon. Lifestyle Lose weight if you are overweight. Maintaining a healthy body mass index (BMI) can help prevent or control high cholesterol. It can also lower your risk for diabetes and high blood pressure. Ask your health care provider to help you with a diet and exercise plan to lose weight safely. Do not use any products that contain nicotine or tobacco. These products include cigarettes, chewing tobacco, and vaping devices, such as e-cigarettes. If you need help quitting, ask your health care provider. Alcohol use Do not drink alcohol if: Your health care provider tells you not to drink. You are pregnant, may be pregnant, or are planning to become pregnant. If you drink alcohol: Limit how much you have to:  0-1 drink a day for women. 0-2 drinks a day for men. Know how much alcohol is in your drink. In the [place] one drink equals one 12 oz bottle of beer (355 mL), one 5 oz glass of wine (148 mL), or one 1 oz glass of hard liquor (44 mL). Activity  Get enough exercise. Do exercises as told by your health care  provider. Each week, do at least 150 minutes of exercise that takes a medium level of effort (moderate-intensity exercise). This kind of exercise: Makes your heart beat faster while allowing you to still be able to talk. Can be done in short sessions several times a day or longer sessions a few times a week. For example, on 5 days each week, you could walk fast or ride your bike 3 times a day for 10 minutes each time. Medicines Your health care provider may recommend medicines to help lower cholesterol. This may be a medicine to lower the amount of cholesterol that your liver makes. You may need medicine if: Diet and lifestyle changes have not lowered your cholesterol enough. You have high cholesterol and other risk factors for heart disease or stroke. Take over-the-counter and prescription medicines only as told by your health care provider. General information Manage your risk factors for high cholesterol. Talk with your health care provider about all your risk factors and how to lower your risk. Manage other conditions that you have, such as diabetes or high blood pressure (hypertension). Have blood tests to check your cholesterol levels at regular points in time as told by your health care provider. Keep all follow-up visits. This is important. Where to find more information American Heart Association: www.heart.org National Heart, Lung, and Blood Institute: [employer].is Summary High cholesterol increases your risk for heart disease and stroke. By keeping your cholesterol level low, you can reduce your risk for these conditions. High cholesterol can often be prevented with diet and lifestyle changes. Work with your health care provider to manage your risk factors, and have your blood tested regularly. This information is not intended to replace advice given to you by your health care provider. Make sure you discuss any questions you have with your health care provider. Document  Revised: 05/27/2022 Document Reviewed: 12/28/2020 Elsevier Patient Education  2024 [employer].

## 2023-04-26 NOTE — Telephone Encounter (Signed)
I changed U/S to renal and will cancel other order, please notify [facility].

## 2023-04-26 NOTE — Addendum Note (Signed)
Addended by: Anda Kraft E on: 04/26/2023 12:48 PM   Modules accepted: Orders

## 2023-04-26 NOTE — Telephone Encounter (Signed)
[facility] received order, but said it would be considered a pelvic ultrasound. Person on the phone asked if you wanted to check kidney functions before they went through with ultrasound. Also needs to know if it would be transvaginal or not? Gave me a number to call if you had any questions: [phone].

## 2023-04-27 ENCOUNTER — Ambulatory Visit (HOSPITAL_COMMUNITY): Payer: Medicare PPO

## 2023-05-01 ENCOUNTER — Inpatient Hospital Stay: Admission: RE | Admit: 2023-05-01 | Payer: Medicare PPO | Source: Ambulatory Visit

## 2023-05-03 ENCOUNTER — Ambulatory Visit
Admission: RE | Admit: 2023-05-03 | Discharge: 2023-05-03 | Disposition: A | Discharge status: Home / Self Care | Payer: Medicare PPO | Source: Ambulatory Visit | Attending: Nurse Practitioner | Admitting: Nurse Practitioner

## 2023-05-03 DIAGNOSIS — R1032 Left lower quadrant pain: Secondary | ICD-10-CM | POA: Diagnosis not present

## 2023-05-16 NOTE — Progress Notes (Signed)
Cardiology Office Note:    Date:  05/19/2023   ID:  Alexandra Wheeler, Alexandra Wheeler 1940/12/09, MRN [id number]  PCP:  Lucky Cowboy, MD   [facility]  Cardiologist:  Meriam Sprague, MD  Advanced Practice Provider:  Beatrice Lecher, PA-C Electrophysiologist:  Regan Lemming, MD    Referring MD: Lucky Cowboy, MD     History of Present Illness:    Alexandra Wheeler is a 82 y.o. female with a hx of paroxysmal Afib, CHB s/p PPM placement 06/2013, HTN, HLD, LBBB, non-obstructive CAD noted on cath in 2014, hypertrophic CM without LVOT obstruction who is followed by Dr. Johney Frame who now presents to clinic for follow-up.  Patient was previously followed by Dr. Shirlee Latch and last seen in clinic in 2018. She has a hx of HCM with asymmetric septal hypertrophy, MV systolic anterior motion and no significant LVOT gradient at rest.  She had a low risk ETT in 2016.  Her last echocardiogram was in 2018 and was c/w HCM.  She saw Dr. Johney Frame in 12/2018 and was last seen by EP in 12/2019 by Francis Dowse, PA-C.  Of note, her Apixaban was DC'd in 08/2019 due to low burden of AF on her pacer.    She was seen by Tereso Newcomer on 10/26/20 after calling with episode of chest pain and near syncope. Her device was interrogated and no abnormalities were detected. Thought her symptoms were related to taking extra doses of HCTZ. TTE stable with no LVOT gradient, G1DD, mild SAM with mild MR.  Was seen in clinic on 08/2022 by Dr. Elberta Fortis for Afib. PPM was also near ERI and she was amenable for generator change. She declined AC due to <1% burden of Afib on PPM.  Was last seen in clinic on 02/2023 by Dr. Elberta Fortis. Was feeling well at that time.   Today, the patient is feeling off. Having episodes dizziness as well as intermittent sweats.  States she thinks it is related to hydroxyzine and citalopram as symptoms do not occur without these medications. No syncope. Has also been slowly gaining  weight has been frustrating to her. No chest pain, SOB, orthopnea, PND, or LE edema.   Past Medical History:  Diagnosis Date   Anxiety    Atrophic vaginitis    Chest pain    a. Adenosine sestamibi (1/14) with no evidence of ischemia or infarction, EF 63%.    Colon polyps    Complete heart block (HCC) 06-2013   status post pacemaker implantation by Dr Johney Frame 06-11-2013   Depression    DI (detrusor instability)    GERD (gastroesophageal reflux disease)    History + Hyplori via EGD   H/O echocardiogram    a. Echo 11/2012:EF 50-55%, focal basal septal hypertrophy with some mitral valve SAM (mild) and MR.  This may be a sigmoid septum with advanced age or could be a variant of hypertrophic cardiomyopathy.   HOCM (hypertrophic obstructive cardiomyopathy) (HCC)    Echocardiogram 2/22: EF 60-65, no RWMA, severe asymmetric septal LVH, no LVOT, Gr 1 DD, mild SAM, mod MR, mild LAE, mod to severe TR, Asc Ao 42 mm, normal RVSF, RVSP 28   HTN (hypertension)    Hyperlipidemia    LBBB (left bundle branch block)    Leukopenia 05/19/2014   Lymphocytosis 05/19/2014   Migraines    Myoview    Myoview 12/21: EF 69, no ischemia or infarction; low risk    Osteoporosis    Paroxysmal atrial fibrillation (  HCC)    Prediabetes    Thoracic aortic aneurysm (HCC)    Chest CTA 2/22: Ascending thoracic aortic aneurysm 44 mm; Ao arch 34 mm>>repeat 1 year   Vitamin D deficiency     Past Surgical History:  Procedure Laterality Date   BREAST BIOPSY Left 07/25/2019   ANGIOLIPOMA   BREAST EXCISIONAL BIOPSY Right    BREAST SURGERY     Breast cyst   CATARACT EXTRACTION Left 2019   Dr. Dione Booze   HEMORRHOID SURGERY     LEFT HEART CATHETERIZATION WITH CORONARY ANGIOGRAM N/A 06/11/2013   Procedure: LEFT HEART CATHETERIZATION WITH CORONARY ANGIOGRAM;  Surgeon: Kathleene Hazel, MD;  Location: [facility] CATH LAB;  Service: Cardiovascular;  Laterality: N/A;   PACEMAKER INSERTION  06-11-2013   STJ dual chamber pacemaker implanted  by Dr Johney Frame for complete heart block   PERMANENT PACEMAKER INSERTION Left 06/11/2013   Procedure: PERMANENT PACEMAKER INSERTION;  Surgeon: Kathleene Hazel, MD;  Location[facility] CATH LAB;  Service: Cardiovascular;  Laterality: Left;   PERMANENT PACEMAKER INSERTION N/A 06/11/2013   Procedure: PERMANENT PACEMAKER INSERTION;  Surgeon: Hillis Range, MD;  Location: [facility] CATH LAB;  Service: Cardiovascular;  Laterality: N/A;   PPM GENERATOR CHANGEOUT N/A 11/25/2022   Procedure: PPM GENERATOR CHANGEOUT;  Surgeon: Regan Lemming, MD;  Location: [facility] INVASIVE CV LAB;  Service: Cardiovascular;  Laterality: N/A;   TEMPORARY PACEMAKER INSERTION N/A 06/11/2013   Procedure: TEMPORARY PACEMAKER INSERTION;  Surgeon: Kathleene Hazel, MD;  Location: [facility] CATH LAB;  Service: Cardiovascular;  Laterality: N/A;   TUBAL LIGATION      Current Medications: Current Meds  Medication Sig   acetaminophen (TYLENOL) 500 MG tablet Take 500 mg by mouth 2 (two) times daily.   Alcohol Swabs (B-D SINGLE USE SWABS REGULAR) PADS Use as directed for Glucose monitoring Daily   Ascorbic Acid (VITAMIN C PO) Take 1 tablet by mouth daily.    CALCIUM PO Take 1 tablet by mouth daily.   Cholecalciferol (VITAMIN D PO) Take 2 capsules by mouth daily.   citalopram (CELEXA) 20 MG tablet TAKE 1 TABLET EVERY DAY FOR MOOD   FOLIC ACID PO Take 1 tablet by mouth daily.    hydrocortisone 1 % ointment APPLY TO HEMORRHOID 3 TO 4 TIMES A DAY   Hydrocortisone Acetate 1 % OINT Apply to Hemorrhoid 3 to 4  x /day (Patient taking differently: Apply 1 Application topically 4 (four) times daily as needed (hemorrhoid).)   hydrOXYzine (ATARAX) 25 MG tablet TAKE 1 TABLET 3 X /DAY AS NEEDED FOR ANXIETY   MAGNESIUM PO Take 1 tablet by mouth daily.   metoprolol succinate (TOPROL-XL) 100 MG 24 hr tablet Take 1.5 tablets (150 mg total) by mouth daily. Take with or immediately following a meal.   Multiple Vitamin (MULTIVITAMIN) tablet Take 1 tablet by mouth  daily.   Multiple Vitamins-Minerals (ZINC PO) Take 1 tablet by mouth daily.   ondansetron (ZOFRAN) 8 MG tablet Take   1 tablet   3 x / day   (every 6-8 hours)   as needed for Nausea   pantoprazole (PROTONIX) 40 MG tablet Take 1 tablet Daily with Supper for Indigestion & Acid Reflux (Patient taking differently: Take 40 mg by mouth daily as needed (acid reflux).)   tretinoin (RETIN-A) 0.025 % cream Please specify directions, refills and quantity (Patient taking differently: Apply 1 Application topically daily as needed (wrinkles). Please specify directions, refills and quantity)   [DISCONTINUED] metoprolol (TOPROL-XL) 200 MG 24 hr tablet TAKE  1 TABLET EVERY DAY FOR BLOOD PRESSURE AND HEART   [DISCONTINUED] rosuvastatin (CRESTOR) 10 MG tablet Take 1 tablet Daily for Cholesterol     Allergies:   Levsin [hyoscyamine sulfate], Mobic [meloxicam], Topamax [topiramate], Zoloft [sertraline hcl], Actonel [risedronate sodium], Boniva [ibandronic acid], Evista [raloxifene], and Fosamax [alendronate sodium]   Social History   Socioeconomic History   Marital status: Widowed    Spouse name: Not on file   Number of children: Not on file   Years of education: Not on file   Highest education level: Not on file  Occupational History   Occupation: Part-time at [employer]  Tobacco Use   Smoking status: Former    Current packs/day: 0.00    Types: Cigarettes    Quit date: 10/09/1975    Years since quitting: 47.6   Smokeless tobacco: Never  Vaping Use   Vaping status: Never Used  Substance and Sexual Activity   Alcohol use: No   Drug use: No   Sexual activity: Never    Birth control/protection: Surgical, Post-menopausal  Other Topics Concern   Not on file  Social History Narrative   Lives alone. Dtr in the area.   Social Determinants of Health   Financial Resource Strain: Not on file  Food Insecurity: Not on file  Transportation Needs: Not on file  Physical Activity: Not on file  Stress: Not on file   Social Connections: Not on file     Family History: The patient's family history includes Aneurysm in her brother; Breast cancer (age of onset: 61) in her sister; Cancer in her father; Diabetes in her brother, brother, and mother; Heart disease in her brother, brother, and mother; Hypertension in her brother; Stroke in her brother.  ROS:   Please see the history of present illness.      EKGs/Labs/Other Studies Reviewed:    The following studies were reviewed today:  Cardiac Studies & Procedures     STRESS TESTS  MYOCARDIAL PERFUSION IMAGING 11/05/2020  Narrative  Nuclear stress EF: 69%.  Blood pressure demonstrated a normal response to exercise.  The study is normal.  This is a low risk study.  The left ventricular ejection fraction is hyperdynamic (>65%).  Negative nuclear medicine stress test for ischemia or infarction.   ECHOCARDIOGRAM  ECHOCARDIOGRAM COMPLETE 05/19/2022  Narrative ECHOCARDIOGRAM REPORT    Patient Name:   Mildreth MCNEILL Copado Date of Exam: 05/19/2022 Medical Rec #:  [id number]              Height:       65.0 in Accession #:    [id number]             Weight:       167.6 lb Date of Birth:  January 30, 1941             BSA:          1.835 m Patient Age:    80 years               BP:           130/80 mmHg Patient Gender: F                      HR:           60 bpm. Exam Location:  [address]  Procedure: 2D Echo, Cardiac Doppler, Color Doppler and Strain Analysis  Indications:     I42.1 HOCM I34.0 Mitral Regurgitation  History:  Patient has prior history of Echocardiogram examinations, most recent 12/09/2020. HOCM, Pacemaker, Arrythmias:LBBB and Paroxysmal atrial fibrillation., Signs/Symptoms:Chest Pain; Risk Factors:Hypertension, Dyslipidemia and Pre-diabetes. Thoracic Aortic Aneurysm.  Sonographer:     Daphine Deutscher RDCS Referring Phys:  [id number] Kathlynn Grate Tabor Bartram Diagnosing Phys: [place]  IMPRESSIONS   1. Left  ventricular ejection fraction, by estimation, is 60 to 65%. The left ventricle has normal function. The left ventricle has no regional wall motion abnormalities. There is moderate asymmetric left ventricular hypertrophy of the basal-septal segment. Left ventricular diastolic parameters are consistent with Grade I diastolic dysfunction (impaired relaxation). The average left ventricular global longitudinal strain is -17.2 % (borderline) 2. Right ventricular systolic function is normal. The right ventricular size is not well visualized. There is mildly elevated pulmonary artery systolic pressure. 3. The mitral valve is grossly normal. Mild mitral valve regurgitation. 4. The aortic valve is tricuspid. Aortic valve regurgitation is trivial. No aortic stenosis is present. 5. Aortic ascending aortic aneurysm (45 mm). 6. Left atrial size was mildly dilated. 7. The inferior vena cava is normal in size with greater than 50% respiratory variability, suggesting right atrial pressure of 3 mmHg.  Conclusion(s)/Recommendation(s): Compared to prior study, MR has improved.  FINDINGS Left Ventricle: Left ventricular ejection fraction, by estimation, is 60 to 65%. The left ventricle has normal function. The left ventricle has no regional wall motion abnormalities. The average left ventricular global longitudinal strain is -17.2 %. The left ventricular internal cavity size was normal in size. There is severe asymmetric left ventricular hypertrophy of the basal-septal segment. Left ventricular diastolic parameters are consistent with Grade I diastolic dysfunction (impaired relaxation).  Right Ventricle: The right ventricular size is not well visualized. Right vetricular wall thickness was not well visualized. Right ventricular systolic function is normal. There is mildly elevated pulmonary artery systolic pressure. The tricuspid regurgitant velocity is 2.97 m/s, and with an assumed right atrial pressure of 3 mmHg, the  estimated right ventricular systolic pressure is 38.3 mmHg.  Left Atrium: Left atrial size was mildly dilated.  Right Atrium: Right atrial size was normal in size.  Pericardium: There is no evidence of pericardial effusion.  Mitral Valve: The mitral valve is grossly normal. Mild mitral valve regurgitation.  Tricuspid Valve: The tricuspid valve is normal in structure. Tricuspid valve regurgitation is mild.  Aortic Valve: The aortic valve is tricuspid. Aortic valve regurgitation is trivial. Aortic regurgitation PHT measures 760 msec. No aortic stenosis is present.  Pulmonic Valve: The pulmonic valve was not well visualized. Pulmonic valve regurgitation is trivial.  Aorta: Ascending aortic aneurysm (45 mm).  Venous: The inferior vena cava is normal in size with greater than 50% respiratory variability, suggesting right atrial pressure of 3 mmHg.  IAS/Shunts: No atrial level shunt detected by color flow Doppler.  Additional Comments: A device lead is visualized.   LEFT VENTRICLE PLAX 2D LVIDd:         3.80 cm   Diastology LVIDs:         2.70 cm   LV e' medial:    4.57 cm/s LV PW:         1.00 cm   LV E/e' medial:  12.5 LV IVS:        1.40 cm   LV e' lateral:   4.79 cm/s LVOT diam:     1.90 cm   LV E/e' lateral: 11.9 LV SV:         80 LV SV Index:   43  2D Longitudinal Strain LVOT Area:     2.84 cm  2D Strain GLS (A2C):   -17.0 % 2D Strain GLS (A3C):   -16.8 % 2D Strain GLS (A4C):   -17.9 % 2D Strain GLS Avg:     -17.2 %  RIGHT VENTRICLE             IVC RV Basal diam:  5.20 cm     IVC diam: 1.00 cm RV S prime:     12.15 cm/s TAPSE (M-mode): 1.6 cm  LEFT ATRIUM             Index        RIGHT ATRIUM           Index LA diam:        3.70 cm 2.02 cm/m   RA Area:     16.70 cm LA Vol (A2C):   45.1 ml 24.58 ml/m  RA Volume:   47.30 ml  25.78 ml/m LA Vol (A4C):   36.8 ml 20.05 ml/m LA Biplane Vol: 42.5 ml 23.16 ml/m AORTIC VALVE LVOT Vmax:   133.00 cm/s LVOT Vmean:   89.200 cm/s LVOT VTI:    0.282 m AI PHT:      760 msec  AORTA Ao Root diam: 3.60 cm Ao Asc diam:  4.50 cm  MITRAL VALVE               TRICUSPID VALVE MV Area (PHT): 2.80 cm    TR Peak grad:   35.3 mmHg MV Decel Time: 271 msec    TR Vmax:        297.00 cm/s MV E velocity: 57.10 cm/s MV A velocity: 84.90 cm/s  SHUNTS MV E/A ratio:  0.67        Systemic VTI:  0.28 m Systemic Diam: 1.90 cm  Carolan Clines Electronically signed by Carolan Clines Signature Date/Time: 05/19/2022/5:16:27 PM    Final (Updated)              EKG: No new tracing   Recent Labs: 01/23/2023: TSH 0.76 04/26/2023: ALT 16; BUN 14; Creat 0.81; Hemoglobin 12.4; Magnesium 2.2; Platelets 196; Potassium 4.4; Sodium 141  Recent Lipid Panel    Component Value Date/Time   CHOL 139 04/26/2023 1157   TRIG 110 04/26/2023 1157   HDL 54 04/26/2023 1157   CHOLHDL 2.6 04/26/2023 1157   VLDL 20 04/11/2017 1025   LDLCALC 65 04/26/2023 1157     Risk Assessment/Calculations:    CHA2DS2-VASc Score = 5  This indicates a 7.2% annual risk of stroke. The patient's score is based upon: CHF History: 0 HTN History: 1 Diabetes History: 0 Stroke History: 0 Vascular Disease History: 1 Age Score: 2 Gender Score: 1      Physical Exam:    VS:  BP 128/88   Pulse 64   Ht 5\' 5"  (1.651 m)   Wt 178 lb 12.8 oz (81.1 kg)   SpO2 95%   BMI 29.75 kg/m     Wt Readings from Last 3 Encounters:  05/19/23 178 lb 12.8 oz (81.1 kg)  04/26/23 175 lb 12.8 oz (79.7 kg)  02/27/23 173 lb (78.5 kg)     GEN:  Comfortable, NAD HEENT: Normal NECK: No JVD; No carotid bruits CARDIAC: RRR, 2/6 mid-peaking systolic murmur. No rubs or gallops RESPIRATORY:  Clear to auscultation without rales, wheezing or rhonchi  ABDOMEN: Soft, non-tender, non-distended MUSCULOSKELETAL:  No edema; No deformity  SKIN: Warm and dry NEUROLOGIC:  Alert and oriented x 3  PSYCHIATRIC:  Normal affect   ASSESSMENT:    1. HOCM (hypertrophic obstructive  cardiomyopathy) (HCC)   2. CHB (complete heart block) (HCC)   3. Essential hypertension   4. Paroxysmal atrial fibrillation (HCC)   5. Cardiac pacemaker in situ   6. Primary hypertension   7. Complete AV block (HCC)     PLAN:    In order of problems listed above:  #HCM: No LVOT obstruction on TTE. Low risk ETT in 2016. No MRI due to PPM. Episode of syncope in 2022 appeared to be secondary to taking extra doses of HCTZ with resultant dehydration. No recurrence of symptoms. TTE 12/2020 with LVEF 60-65%, no LVOT obstruction, mild SAM with moderate MR. Repeat TTE 05/2022 with EF 60-65%, no LVOTO, mild MR, ascending aortic aneurysm 45mm. Currently with mild dizziness which I suspect is related to her atarax as no symptoms when she does not take the med. No syncope, palpitations, exertional intolerance. Will continue medications as below -Change metop to 150mg  XL daily as patient having mild dizziness/fatigue s -ETT low risk -No arrhythmias on cardiac monitor -Maintain adequate hydration -Only using HCTZ as needed for LE edema  #Chest Pain: Resolved. Cath 2014 with minimal plaque. Exercise myoview negative for ischemia and normal EF.  TTE 05/2022 with EF 60-65%, moderate basal septal hypertrophy, G1DD, no LVOTO, mild MR, ascending aortic aneurysm 45mm.  #Complete Heart Block: S/p PPM placement. -Follow-up with EP as scheduled   #Paroxysmal Afib: <1% burden on PPM. Has declined AC. Follows with Dr. Elberta Fortis.  #Moderate MR with mild SAM>>Mild MR: Improved on most recent TTE,. Will continue with monitoring. -Continue to monitor  #Moderate-to-severe Tricuspid Regurgitation> Now improved to Mild: Mild on most recent TTE  #HTN: -Change metop to 150mg  XL daily as above  #HLD: Stopped taking crestor and prefers to continue with lifestyle modifications at this time  #Aortic Dilation: Mild on CTA 002/2024 at 4.2cm which is stable. -Continue annual monitoring   Follow up: 6 months    Medication Adjustments/Labs and Tests Ordered: Current medicines are reviewed at length with the patient today.  Concerns regarding medicines are outlined above.  No orders of the defined types were placed in this encounter.  Meds ordered this encounter  Medications   metoprolol succinate (TOPROL-XL) 100 MG 24 hr tablet    Sig: Take 1.5 tablets (150 mg total) by mouth daily. Take with or immediately following a meal.    Dispense:  135 tablet    Refill:  2    Dose decrease    Patient Instructions  Medication Instructions:   DECREASE YOUR METOPROLOL SUCCINATE (TOPROL XL) TO 150 MG BY MOUTH DAILY  *If you need a refill on your cardiac medications before your next appointment, please call your pharmacy*    Follow-Up: At [facility], you and your health needs are our priority.  As part of our continuing mission to provide you with exceptional heart care, we have created designated Provider Care Teams.  These Care Teams include your primary Cardiologist (physician) and Advanced Practice Providers (APPs -  Physician Assistants and Nurse Practitioners) who all work together to provide you with the care you need, when you need it.  We recommend signing up for the patient portal called "MyChart".  Sign up information is provided on this After Visit Summary.  MyChart is used to connect with patients for Virtual Visits (Telemedicine).  Patients are able to view lab/test results, encounter notes, upcoming appointments, etc.  Non-urgent messages can be sent to  your provider as well.   To learn more about what you can do with MyChart, go to [email or web address].    Your next appointment:   6 month(s)  Provider:   DR. Izora Ribas      Signed, Meriam Sprague, MD  05/19/2023 10:29 AM     [facility]

## 2023-05-19 ENCOUNTER — Encounter: Payer: Self-pay | Admitting: Cardiology

## 2023-05-19 ENCOUNTER — Ambulatory Visit: Payer: Medicare PPO | Attending: Cardiology | Admitting: Cardiology

## 2023-05-19 VITALS — BP 128/88 | HR 64 | Ht 65.0 in | Wt 178.8 lb

## 2023-05-19 DIAGNOSIS — I480 Paroxysmal atrial fibrillation: Secondary | ICD-10-CM | POA: Diagnosis not present

## 2023-05-19 DIAGNOSIS — Z950 Presence of cardiac pacemaker: Secondary | ICD-10-CM | POA: Diagnosis not present

## 2023-05-19 DIAGNOSIS — I442 Atrioventricular block, complete: Secondary | ICD-10-CM | POA: Diagnosis not present

## 2023-05-19 DIAGNOSIS — I421 Obstructive hypertrophic cardiomyopathy: Secondary | ICD-10-CM

## 2023-05-19 DIAGNOSIS — I10 Essential (primary) hypertension: Secondary | ICD-10-CM | POA: Diagnosis not present

## 2023-05-19 MED ORDER — METOPROLOL SUCCINATE ER 100 MG PO TB24: 150.0000 mg | ORAL_TABLET | Freq: Every day | ORAL | 2 refills | Status: DC

## 2023-05-19 NOTE — Patient Instructions (Signed)
Medication Instructions:   DECREASE YOUR METOPROLOL SUCCINATE (TOPROL XL) TO 150 MG BY MOUTH DAILY  *If you need a refill on your cardiac medications before your next appointment, please call your pharmacy*    Follow-Up: At [facility], you and your health needs are our priority.  As part of our continuing mission to provide you with exceptional heart care, we have created designated Provider Care Teams.  These Care Teams include your primary Cardiologist (physician) and Advanced Practice Providers (APPs -  Physician Assistants and Nurse Practitioners) who all work together to provide you with the care you need, when you need it.  We recommend signing up for the patient portal called "MyChart".  Sign up information is provided on this After Visit Summary.  MyChart is used to connect with patients for Virtual Visits (Telemedicine).  Patients are able to view lab/test results, encounter notes, upcoming appointments, etc.  Non-urgent messages can be sent to your provider as well.   To learn more about what you can do with MyChart, go to [email or web address].    Your next appointment:   6 month(s)  Provider:   DR. Izora Ribas

## 2023-05-26 ENCOUNTER — Ambulatory Visit (INDEPENDENT_AMBULATORY_CARE_PROVIDER_SITE_OTHER): Payer: Medicare PPO

## 2023-05-26 DIAGNOSIS — I442 Atrioventricular block, complete: Secondary | ICD-10-CM | POA: Diagnosis not present

## 2023-05-28 LAB — CUP PACEART REMOTE DEVICE CHECK
Battery Remaining Longevity: 91 mo
Battery Remaining Percentage: 95.5 %
Battery Voltage: 2.99 V
Brady Statistic AP VP Percent: 92 %
Brady Statistic AP VS Percent: 1 %
Brady Statistic AS VP Percent: 8 %
Brady Statistic AS VS Percent: 1 %
Brady Statistic RA Percent Paced: 92 %
Brady Statistic RV Percent Paced: 99 %
Date Time Interrogation Session: 20240719040015
Implantable Lead Connection Status: 753985
Implantable Lead Connection Status: 753985
Implantable Lead Implant Date: 20140805
Implantable Lead Implant Date: 20140805
Implantable Lead Location: 753859
Implantable Lead Location: 753860
Implantable Lead Model: 1944
Implantable Lead Model: 1948
Implantable Pulse Generator Implant Date: 20240119
Lead Channel Impedance Value: 510 Ohm
Lead Channel Impedance Value: 580 Ohm
Lead Channel Pacing Threshold Amplitude: 0.25 V
Lead Channel Pacing Threshold Amplitude: 0.5 V
Lead Channel Pacing Threshold Pulse Width: 0.5 ms
Lead Channel Pacing Threshold Pulse Width: 0.8 ms
Lead Channel Sensing Intrinsic Amplitude: 12 mV
Lead Channel Sensing Intrinsic Amplitude: 3.1 mV
Lead Channel Setting Pacing Amplitude: 2 V
Lead Channel Setting Pacing Amplitude: 2.5 V
Lead Channel Setting Pacing Pulse Width: 0.5 ms
Lead Channel Setting Sensing Sensitivity: 4 mV
Pulse Gen Model: 2272
Pulse Gen Serial Number: 8145790

## 2023-05-30 NOTE — Progress Notes (Unsigned)
Assessment and Plan:  Alexandra Wheeler was seen today for an episodic visit.  Diagnoses and all order for this visit:  Nausea/Bloating/Abdominal pain Start Ondansetron for treatment of nausea Continue to advance diet as tolerated. Notify office if s/s fail to improve. Discussed if H Pylori positive will treat w/abx.  - ondansetron (ZOFRAN) 8 MG tablet; Take   1 tablet   3 x / day   (every 6-8 hours)   as needed for Nausea  Dispense: 30 tablet; Refill: 0 - CBC with Differential/Platelet - COMPLETE METABOLIC PANEL WITH GFR - H. pylori breath test  Thrush, oral Continue to rinse mouth daily. Discussed proper hygiene  - nystatin (MYCOSTATIN) 100000 UNIT/ML suspension; Swish 5 mL in the mouth and retain for as long as possible (several minutes) before swallowing.  Dispense: 60 mL; Refill: 0  Aversion to food Continue to take Ondansetron and advance diet as tolerated.  Notify office for further evaluation and treatment, questions or concerns if s/s fail to improve. The risks and benefits of my recommendations, as well as other treatment options were discussed with the patient today. Questions were answered.  Further disposition pending results of labs. Discussed med's effects and SE's.    Over 20 minutes of exam, counseling, chart review, and critical decision making was performed.   Future Appointments  Date Time Provider Department Center  08/01/2023 11:30 AM Lucky Cowboy, MD [facility] None  08/25/2023  7:00 AM [facility] DEVICE REMOTES [facility]  11/07/2023 11:00 AM Raynelle Dick, NP [facility] None  11/24/2023  7:00 AM [facility] DEVICE REMOTES [facility]  02/06/2024 11:00 AM Lucky Cowboy, MD [facility] None  02/23/2024  7:00 AM [facility] DEVICE REMOTES [facility]  04/25/2024 11:00 AM Raynelle Dick, NP [facility] None  05/24/2024  7:00 AM [facility] DEVICE REMOTES [facility]  08/23/2024  7:00 AM  [facility] DEVICE REMOTES [facility]  11/22/2024  7:00 AM [facility] DEVICE REMOTES [facility]    ------------------------------------------------------------------------------------------------------------------   HPI BP 120/82   Pulse 63   Temp 97.9 F (36.6 C)   Ht 5\' 5"  (1.651 m)   Wt 176 lb 6.4 oz (80 kg)   SpO2 99%   BMI 29.35 kg/m   82 y.o.female presents for evaluation of nausea accompanied by bloating and abdominal pain. Pain is generalized but she does have more noticeable mild symptoms in the left upper quadrant. Symptoms have been present for the last two weeks. She thought she had COVID but tested and was negative in home. She does have some food aversion but is able to eat and hold down food and liquid.  Eating does not make it worse.  Nausea is intermittent throughout the day.  Will wake up feeling nauseas.  Denies vomiting. Has noticed no bowel movement changes but has noticed the stool color has changed to a darker color. She has not noticed any blood.  Denies any urinary symptoms. Also notes a slimy film in her mouth mostly in the morning that she has to remove. She currently tags pantoprazole 40 milligrams daily. She denies fever chills.  Past Medical History:  Diagnosis Date   Anxiety    Atrophic vaginitis    Chest pain    a. Adenosine sestamibi (1/14) with no evidence of ischemia or infarction, EF 63%.    Colon polyps    Complete heart block (HCC) 06-2013   status post pacemaker implantation by Dr Johney Frame 06-11-2013   Depression    DI (detrusor instability)    GERD (gastroesophageal reflux  disease)    History + Hyplori via EGD   H/O echocardiogram    a. Echo 11/2012:EF 50-55%, focal basal septal hypertrophy with some mitral valve SAM (mild) and MR.  This may be a sigmoid septum with advanced age or could be a variant of hypertrophic cardiomyopathy.   HOCM (hypertrophic obstructive cardiomyopathy) (HCC)    Echocardiogram 2/22: EF 60-65,  no RWMA, severe asymmetric septal LVH, no LVOT, Gr 1 DD, mild SAM, mod MR, mild LAE, mod to severe TR, Asc Ao 42 mm, normal RVSF, RVSP 28   HTN (hypertension)    Hyperlipidemia    LBBB (left bundle branch block)    Leukopenia 05/19/2014   Lymphocytosis 05/19/2014   Migraines    Myoview    Myoview 12/21: EF 69, no ischemia or infarction; low risk    Osteoporosis    Paroxysmal atrial fibrillation (HCC)    Prediabetes    Thoracic aortic aneurysm (HCC)    Chest CTA 2/22: Ascending thoracic aortic aneurysm 44 mm; Ao arch 34 mm>>repeat 1 year   Vitamin D deficiency      Allergies  Allergen Reactions   Levsin [Hyoscyamine Sulfate]     unknown   Mobic [Meloxicam]     unknown    Topamax [Topiramate]     unknown    Zoloft [Sertraline Hcl]     unknown    Actonel [Risedronate Sodium] Other (See Comments)    Pt didn't like the way it made her feel    Boniva [Ibandronic Acid] Other (See Comments)    Pt didn't like the way it made her feel   Evista [Raloxifene] Other (See Comments)    Pt didn't like the way it made her feel   Fosamax [Alendronate Sodium] Other (See Comments)    Pt didn't like the way it made her feel     Current Outpatient Medications on File Prior to Visit  Medication Sig   acetaminophen (TYLENOL) 500 MG tablet Take 500 mg by mouth 2 (two) times daily.   Alcohol Swabs (B-D SINGLE USE SWABS REGULAR) PADS Use as directed for Glucose monitoring Daily   Ascorbic Acid (VITAMIN C PO) Take 1 tablet by mouth daily.    CALCIUM PO Take 1 tablet by mouth daily.   Cholecalciferol (VITAMIN D PO) Take 2 capsules by mouth daily.   citalopram (CELEXA) 20 MG tablet TAKE 1 TABLET EVERY DAY FOR MOOD   FOLIC ACID PO Take 1 tablet by mouth daily.    hydrocortisone 1 % ointment APPLY TO HEMORRHOID 3 TO 4 TIMES A DAY   Hydrocortisone Acetate 1 % OINT Apply to Hemorrhoid 3 to 4  x /day (Patient taking differently: Apply 1 Application topically 4 (four) times daily as needed  (hemorrhoid).)   hydrOXYzine (ATARAX) 25 MG tablet TAKE 1 TABLET 3 X /DAY AS NEEDED FOR ANXIETY   MAGNESIUM PO Take 1 tablet by mouth daily.   metoprolol succinate (TOPROL-XL) 100 MG 24 hr tablet Take 1.5 tablets (150 mg total) by mouth daily. Take with or immediately following a meal.   Multiple Vitamin (MULTIVITAMIN) tablet Take 1 tablet by mouth daily.   Multiple Vitamins-Minerals (ZINC PO) Take 1 tablet by mouth daily.   pantoprazole (PROTONIX) 40 MG tablet Take 1 tablet Daily with Supper for Indigestion & Acid Reflux (Patient taking differently: Take 40 mg by mouth daily as needed (acid reflux).)   tretinoin (RETIN-A) 0.025 % cream Please specify directions, refills and quantity (Patient taking differently: Apply 1 Application topically daily as  needed (wrinkles). Please specify directions, refills and quantity)   No current facility-administered medications on file prior to visit.    ROS: all negative except what is noted in the HPI.   Physical Exam:  BP 120/82   Pulse 63   Temp 97.9 F (36.6 C)   Ht 5\' 5"  (1.651 m)   Wt 176 lb 6.4 oz (80 kg)   SpO2 99%   BMI 29.35 kg/m   General Appearance: NAD.  Awake, conversant and cooperative. Eyes: PERRLA, EOMs intact.  Sclera white.  Conjunctiva without erythema. Sinuses: No frontal/maxillary tenderness.  No nasal discharge. Nares patent.  ENT/Mouth: Ext aud canals clear.  Bilateral TMs w/DOL and without erythema or bulging. Hearing intact.  Posterior pharynx without swelling or exudate.  Tonsils without swelling or erythema.  Neck: Supple.  No masses, nodules or thyromegaly. Respiratory: Effort is regular with non-labored breathing. Breath sounds are equal bilaterally without rales, rhonchi, wheezing or stridor.  Cardio: RRR with no MRGs. Brisk peripheral pulses without edema.  Abdomen: Active BS in all four quadrants.  Soft and non-tender without guarding, rebound tenderness, hernias or masses. Lymphatics: Non tender without  lymphadenopathy.  Musculoskeletal: Full ROM, 5/5 strength, normal ambulation.  No clubbing or cyanosis. Skin: Appropriate color for ethnicity. Warm without rashes, lesions, ecchymosis, ulcers.  Neuro: CN II-XII grossly normal. Normal muscle tone without cerebellar symptoms and intact sensation.   Psych: AO X 3,  appropriate mood and affect, insight and judgment.     Adela Glimpse, NP 9:56 AM [facility]

## 2023-05-31 ENCOUNTER — Encounter: Payer: Self-pay | Admitting: Nurse Practitioner

## 2023-05-31 ENCOUNTER — Ambulatory Visit (INDEPENDENT_AMBULATORY_CARE_PROVIDER_SITE_OTHER): Payer: Medicare PPO | Admitting: Nurse Practitioner

## 2023-05-31 VITALS — BP 120/82 | HR 63 | Temp 97.9°F | Ht 65.0 in | Wt 176.4 lb

## 2023-05-31 DIAGNOSIS — B370 Candidal stomatitis: Secondary | ICD-10-CM

## 2023-05-31 DIAGNOSIS — R1084 Generalized abdominal pain: Secondary | ICD-10-CM

## 2023-05-31 DIAGNOSIS — R6339 Other feeding difficulties: Secondary | ICD-10-CM | POA: Diagnosis not present

## 2023-05-31 DIAGNOSIS — R110 Nausea: Secondary | ICD-10-CM | POA: Diagnosis not present

## 2023-05-31 DIAGNOSIS — R140 Abdominal distension (gaseous): Secondary | ICD-10-CM

## 2023-05-31 LAB — CBC WITH DIFFERENTIAL/PLATELET
Absolute Monocytes: 298 cells/uL (ref 200–950)
Basophils Absolute: 38 cells/uL (ref 0–200)
Basophils Relative: 1.2 %
Eosinophils Absolute: 128 cells/uL (ref 15–500)
Hemoglobin: 13 g/dL (ref 11.7–15.5)
MCHC: 31.9 g/dL — ABNORMAL LOW (ref 32.0–36.0)
MCV: 87.4 fL (ref 80.0–100.0)
RBC: 4.67 10*6/uL (ref 3.80–5.10)
RDW: 13 % (ref 11.0–15.0)
WBC: 3.2 10*3/uL — ABNORMAL LOW (ref 3.8–10.8)

## 2023-05-31 MED ORDER — ONDANSETRON HCL 8 MG PO TABS
ORAL_TABLET | ORAL | 0 refills | Status: DC
Start: 2023-05-31 — End: 2023-07-27

## 2023-05-31 MED ORDER — NYSTATIN 100000 UNIT/ML MT SUSP
OROMUCOSAL | 0 refills | Status: DC
Start: 2023-05-31 — End: 2023-07-27

## 2023-05-31 NOTE — Patient Instructions (Signed)
Helicobacter Pylori Antibodies Test Why am I having this test? This test is used to check for a type of bacteria called Helicobacter pylori (H. pylori). H. pylori can be found in the cells that line the stomach. Having high levels of H. pylori in your stomach puts you at risk for: Stomach ulcers and small bowel ulcers. Long-term (chronic) inflammation of the lining of the stomach. Ulcers in the part of the body that moves food from the mouth to the stomach (esophagus). Stomach cancer if the infection is not treated. Most people with H. pylori in their stomach have no symptoms. Your health care provider may ask you to have this test if you have symptoms of a stomach ulcer or small bowel ulcer, such as stomach pain before or after eating, heartburn, or nausea after eating. What is being tested? This test checks your blood for antibodies to the H. pylori bacteria. Antibodies are proteins made by your immune system to fight germs and infection. The test checks for antibodies that the immune system produces in response to infection with H. pylori. What kind of sample is taken?  A blood sample is required for this test. It is usually collected by inserting a needle into a blood vessel or by sticking a finger with a small needle. Tell a health care provider about: All medicines you are taking, including vitamins, herbs, eye drops, creams, and over-the-counter medicines. How are the results reported? Your test results will be reported as values that are categorized as positive, negative, or equivocal. Equivocal means that your results are neither positive nor negative. Your health care provider will compare your results to normal ranges that were established after testing a large group of people (reference ranges). Reference ranges may vary among labs and hospitals. For this test, common reference ranges for the two types of antibodies that may be tested are: IgG antibodies: Less than 0.75 units/mL. This  is negative. 1 unit/mL or greater. This is positive. 0.75-0.99 units/mL. This is equivocal. IgM antibodies: 30 units/mL or less. This is negative. 40 units/mL or greater. This is positive. 30.01-39.99 units/mL. This is equivocal. What do the results mean? Test results that are higher than normal, or positive, may indicate various health conditions, such as: Short-term or long-term irritation of the stomach lining (gastritis). Small bowel ulcer. Stomach ulcer. Stomach cancer. Talk with your health care provider about what your results mean. Questions to ask your health care provider Ask your health care provider, or the department that is doing the test: When will my results be ready? How will I get my results? What are my treatment options? What other tests do I need? What are my next steps? Summary This test is used to check for a type of bacteria called Helicobacter pylori (H. pylori). Having high levels of H. pylori in your stomach puts you at risk for ulcers in the gastrointestinal tract or stomach cancer. Most people with H. pylori in their stomach have no symptoms. Your health care provider may ask you to have this test if you have symptoms of a stomach ulcer or small bowel ulcer, such as stomach pain before or after eating, heartburn, or nausea after eating. This test checks your blood for antibodies to the H. pylori bacteria. Talk with your health care provider about what your results mean. This information is not intended to replace advice given to you by your health care provider. Make sure you discuss any questions you have with your health care provider. Document Revised: 05/12/2021  Document Reviewed: 05/12/2021 Elsevier Patient Education  2024 [employer].

## 2023-06-01 LAB — CBC WITH DIFFERENTIAL/PLATELET
Eosinophils Relative: 4 %
HCT: 40.8 % (ref 35.0–45.0)
Lymphs Abs: 1619 cells/uL (ref 850–3900)
MCH: 27.8 pg (ref 27.0–33.0)
MPV: 11 fL (ref 7.5–12.5)
Monocytes Relative: 9.3 %
Neutro Abs: 1117 cells/uL — ABNORMAL LOW (ref 1500–7800)
Neutrophils Relative %: 34.9 %
Platelets: 200 10*3/uL (ref 140–400)
Total Lymphocyte: 50.6 %

## 2023-06-01 LAB — COMPLETE METABOLIC PANEL WITH GFR
AG Ratio: 1.6 (calc) (ref 1.0–2.5)
AST: 19 U/L (ref 10–35)
Albumin: 4.1 g/dL (ref 3.6–5.1)
Alkaline phosphatase (APISO): 104 U/L (ref 37–153)
BUN: 11 mg/dL (ref 7–25)
CO2: 28 mmol/L (ref 20–32)
Calcium: 9.5 mg/dL (ref 8.6–10.4)
Chloride: 108 mmol/L (ref 98–110)
Creat: 0.82 mg/dL (ref 0.60–0.95)
Glucose, Bld: 93 mg/dL (ref 65–99)
Potassium: 4.3 mmol/L (ref 3.5–5.3)
Sodium: 143 mmol/L (ref 135–146)
Total Bilirubin: 0.3 mg/dL (ref 0.2–1.2)
Total Protein: 6.6 g/dL (ref 6.1–8.1)
eGFR: 71 mL/min/{1.73_m2} (ref >=60)

## 2023-06-05 ENCOUNTER — Encounter: Payer: Self-pay | Admitting: Nurse Practitioner

## 2023-06-05 DIAGNOSIS — R6339 Other feeding difficulties: Secondary | ICD-10-CM

## 2023-06-05 DIAGNOSIS — R140 Abdominal distension (gaseous): Secondary | ICD-10-CM

## 2023-06-05 DIAGNOSIS — R1084 Generalized abdominal pain: Secondary | ICD-10-CM

## 2023-06-05 DIAGNOSIS — R1032 Left lower quadrant pain: Secondary | ICD-10-CM

## 2023-06-05 DIAGNOSIS — R110 Nausea: Secondary | ICD-10-CM

## 2023-06-05 DIAGNOSIS — B370 Candidal stomatitis: Secondary | ICD-10-CM

## 2023-06-08 NOTE — Progress Notes (Signed)
Remote pacemaker transmission.   

## 2023-06-12 ENCOUNTER — Ambulatory Visit
Admission: RE | Admit: 2023-06-12 | Discharge: 2023-06-12 | Disposition: A | Discharge status: Home / Self Care | Payer: Medicare PPO | Source: Ambulatory Visit | Attending: Nurse Practitioner | Admitting: Nurse Practitioner

## 2023-06-12 DIAGNOSIS — B370 Candidal stomatitis: Secondary | ICD-10-CM | POA: Diagnosis not present

## 2023-06-12 DIAGNOSIS — R110 Nausea: Secondary | ICD-10-CM

## 2023-06-12 DIAGNOSIS — I700 Atherosclerosis of aorta: Secondary | ICD-10-CM | POA: Diagnosis not present

## 2023-06-12 DIAGNOSIS — R1032 Left lower quadrant pain: Secondary | ICD-10-CM

## 2023-06-12 DIAGNOSIS — R140 Abdominal distension (gaseous): Secondary | ICD-10-CM | POA: Diagnosis not present

## 2023-06-12 DIAGNOSIS — R6339 Other feeding difficulties: Secondary | ICD-10-CM | POA: Diagnosis not present

## 2023-06-12 DIAGNOSIS — R1084 Generalized abdominal pain: Secondary | ICD-10-CM

## 2023-06-12 MED ORDER — IOPAMIDOL (ISOVUE-300) INJECTION 61%
100.0000 mL | Freq: Once | INTRAVENOUS | Status: AC | PRN
  Administered 2023-06-12: 100 mL via INTRAVENOUS

## 2023-06-16 ENCOUNTER — Telehealth: Payer: Self-pay

## 2023-06-16 NOTE — Telephone Encounter (Signed)
Following alert received from [employer] received for ongoing AF greater than 24 hours, 8/07 and 06/15/23 episodes are likely the same AF event due to atrial undersensing and ongoing since 06/14/23 at 12:23 am,  Burden 1.9%, OAC previously discontinued in 2020 due to low burden, V rates are controlled. Routed to clinic for increased burden.  Patient called who denies any symptoms and feeling well. Not on OAC since 2020 d/t low AF burden (<1%). Currently 1.9%. Request patient send remote transmission 06/19/23 to recheck. Directions sent to patient via mychart. Advised to call if she has further questions or concerns.

## 2023-06-19 NOTE — Telephone Encounter (Signed)
Remote transmission received. Patient is now back in SR. AT/AF burden 2.1%.

## 2023-06-22 MED ORDER — APIXABAN 5 MG PO TABS: 5.0000 mg | ORAL_TABLET | Freq: Two times a day (BID) | ORAL | 3 refills | Status: DC

## 2023-06-22 NOTE — Telephone Encounter (Signed)
Outreach made to Pt.  Advised per Dr. Lennart Pall recommends Pt restart Eliquis for increased afib burden.  Per Dr. Kallie Edward does not need office visit to restart.    Pt has had recent lab work.  Confirmed with pharmacy that correct Eliquis dosage is 5 mg PO BID.  Pt states she has Eliquis at home and will restart.  Pt has follow up with PCP next month to recheck lab work.  All questions answered.  New prescription sent.

## 2023-07-17 ENCOUNTER — Telehealth: Payer: Self-pay | Admitting: Internal Medicine

## 2023-07-17 NOTE — Telephone Encounter (Signed)
Pt c/o medication issue:  1. Name of Medication:   metoprolol succinate (TOPROL-XL) 100 MG 24 hr tablet  apixaban (ELIQUIS) 5 MG TABS tablet   2. How are you currently taking this medication (dosage and times per day)?   As prescribed  3. Are you having a reaction (difficulty breathing--STAT)?  Lightheaded, confused  4. What is your medication issue?    Patient stated she started taking Eliquis about 2-3 weeks ago and she has started feeling lightheaded and confused.  Patient wants advice of next steps.

## 2023-07-17 NOTE — Telephone Encounter (Signed)
Pt reports no energy, light headed. Has been occurring the last several weeks, since medication change. Reports Toprol was decreased back in July d/t to fatigue, but then her BPs starting to increase at home and she went back to the 200 mg daily. She is afraid to "get out or drive" d/t symptoms. Educated to the symptoms Metoprolol dose changes can cause. Aware will forward to MD for advisement. Informed that he may recommending decreasing it and adding something else for BP vs switching to another medication all together. Aware it may be later this week before following up w/ her with MD recommendation/s. Patient verbalized understanding and agreeable to plan.

## 2023-07-18 NOTE — Telephone Encounter (Signed)
Left a message to call back.

## 2023-07-18 NOTE — Telephone Encounter (Signed)
Pt returning call

## 2023-07-18 NOTE — Telephone Encounter (Signed)
Called pt advised of MD response: We can decrease to 150 mg.  I added back Clinic for Thursday due to a cancelled procedure, we could add her into this clinic.   Scheduled pt to see MD on 07/27/23 at 9 am. Pt had no further concerns at this time.

## 2023-07-27 ENCOUNTER — Ambulatory Visit: Payer: Medicare PPO | Attending: Internal Medicine | Admitting: Internal Medicine

## 2023-07-27 ENCOUNTER — Encounter: Payer: Self-pay | Admitting: Internal Medicine

## 2023-07-27 VITALS — BP 122/80 | HR 60 | Ht 65.0 in

## 2023-07-27 DIAGNOSIS — I422 Other hypertrophic cardiomyopathy: Secondary | ICD-10-CM | POA: Diagnosis not present

## 2023-07-27 DIAGNOSIS — I480 Paroxysmal atrial fibrillation: Secondary | ICD-10-CM | POA: Diagnosis not present

## 2023-07-27 DIAGNOSIS — I442 Atrioventricular block, complete: Secondary | ICD-10-CM

## 2023-07-27 DIAGNOSIS — I421 Obstructive hypertrophic cardiomyopathy: Secondary | ICD-10-CM | POA: Diagnosis not present

## 2023-07-27 MED ORDER — LORAZEPAM 1 MG PO TABS: ORAL_TABLET | ORAL | 0 refills | Status: DC

## 2023-07-27 MED ORDER — METOPROLOL SUCCINATE ER 100 MG PO TB24: 100.0000 mg | ORAL_TABLET | Freq: Every day | ORAL | 3 refills | Status: DC

## 2023-07-27 NOTE — Addendum Note (Signed)
Addended by: Macie Burows on: 07/27/2023 11:06 AM   Modules accepted: Orders

## 2023-07-27 NOTE — Progress Notes (Signed)
Cardiology Office Note:    Date:  07/27/2023   ID:  Alexandra Wheeler, Alexandra Wheeler November 17, 1940, MRN [id number]  PCP:  Lucky Cowboy, MD   [facility] Providers Cardiologist:  Meriam Sprague, MD Cardiology APP:  Beatrice Lecher, PA-C  Electrophysiologist:  Will Jorja Loa, MD     Referring MD: Lucky Cowboy, MD   CC: Dizziness Consulted for the evaluation of HCM eval at the behest of Dr. Shari Prows   History of Present Illness:    Alexandra Wheeler is a 82 y.o. female with a hx of paroxsymal atrial fibrillation, complete heart block s/p PPM.  Has a baseline HTN and HLD.  She has been diagnosed with hypertrophic cardiomyopathy  Patient notes SOB at rest and  DOE Has significant anxiety and has felt worse when her PCP started atarax. Notes that her fatigue has improved with decreased beta blocker dose. She feels bloated: she notes weight gain with some leg and ankles swelling. Notes no palpitations Notes rare CP. Notes no Dizziness. Notes recent syncope.  Notable family events include - one daughter; she is 35, she has not been screened.  - one son; who died off unclear causes in a car accident (as a driver) in [id number].  She has no memory of hypertrophic cardiomyopathy being discussed.  She also experiences a foggy vision, which she is unsure if it is related to her age or medication. She has noticed weight gain and a feeling of bloating, which she suspects might be due to fluid retention. She reports that her symptoms have worsened since she contracted COVID-19 during a trip to [place] last year. She has been feeling progressively weaker since then and has not regained her strength or sense of wellbeing. She also reports anxiety, which she believes might be contributing to her symptoms.  She notes that she has not felt well for the past two years.   Past Medical History:  Diagnosis Date   Anxiety    Atrophic vaginitis    Chest pain    a. Adenosine  sestamibi (1/14) with no evidence of ischemia or infarction, EF 63%.    Colon polyps    Complete heart block (HCC) 06-2013   status post pacemaker implantation by Dr Johney Frame 06-11-2013   Depression    DI (detrusor instability)    GERD (gastroesophageal reflux disease)    History + Hyplori via EGD   H/O echocardiogram    a. Echo 11/2012:EF 50-55%, focal basal septal hypertrophy with some mitral valve SAM (mild) and MR.  This may be a sigmoid septum with advanced age or could be a variant of hypertrophic cardiomyopathy.   HOCM (hypertrophic obstructive cardiomyopathy) (HCC)    Echocardiogram 2/22: EF 60-65, no RWMA, severe asymmetric septal LVH, no LVOT, Gr 1 DD, mild SAM, mod MR, mild LAE, mod to severe TR, Asc Ao 42 mm, normal RVSF, RVSP 28   HTN (hypertension)    Hyperlipidemia    LBBB (left bundle branch block)    Leukopenia 05/19/2014   Lymphocytosis 05/19/2014   Migraines    Myoview    Myoview 12/21: EF 69, no ischemia or infarction; low risk    Osteoporosis    Paroxysmal atrial fibrillation (HCC)    Prediabetes    Thoracic aortic aneurysm (HCC)    Chest CTA 2/22: Ascending thoracic aortic aneurysm 44 mm; Ao arch 34 mm>>repeat 1 year   Vitamin D deficiency     Past Surgical History:  Procedure Laterality Date   BREAST BIOPSY Left  07/25/2019   ANGIOLIPOMA   BREAST EXCISIONAL BIOPSY Right    BREAST SURGERY     Breast cyst   CATARACT EXTRACTION Left 2019   Dr. Dione Booze   HEMORRHOID SURGERY     LEFT HEART CATHETERIZATION WITH CORONARY ANGIOGRAM N/A 06/11/2013   Procedure: LEFT HEART CATHETERIZATION WITH CORONARY ANGIOGRAM;  Surgeon: Kathleene Hazel, MD;  Location: [facility] CATH LAB;  Service: Cardiovascular;  Laterality: N/A;   PACEMAKER INSERTION  06-11-2013   STJ dual chamber pacemaker implanted by Dr Johney Frame for complete heart block   PERMANENT PACEMAKER INSERTION Left 06/11/2013   Procedure: PERMANENT PACEMAKER INSERTION;  Surgeon: Kathleene Hazel, MD;  Location: [facility] CATH LAB;   Service: Cardiovascular;  Laterality: Left;   PERMANENT PACEMAKER INSERTION N/A 06/11/2013   Procedure: PERMANENT PACEMAKER INSERTION;  Surgeon: Hillis Range, MD;  Location: [facility] CATH LAB;  Service: Cardiovascular;  Laterality: N/A;   PPM GENERATOR CHANGEOUT N/A 11/25/2022   Procedure: PPM GENERATOR CHANGEOUT;  Surgeon: Regan Lemming, MD;  Location: [facility] INVASIVE CV LAB;  Service: Cardiovascular;  Laterality: N/A;   TEMPORARY PACEMAKER INSERTION N/A 06/11/2013   Procedure: TEMPORARY PACEMAKER INSERTION;  Surgeon: Kathleene Hazel, MD;  Location: [facility] CATH LAB;  Service: Cardiovascular;  Laterality: N/A;   TUBAL LIGATION      Current Medications: Current Meds  Medication Sig   acetaminophen (TYLENOL) 500 MG tablet Take 500 mg by mouth as needed.   Alcohol Swabs (B-D SINGLE USE SWABS REGULAR) PADS Use as directed for Glucose monitoring Daily   apixaban (ELIQUIS) 5 MG TABS tablet Take 1 tablet (5 mg total) by mouth 2 (two) times daily.   Ascorbic Acid (VITAMIN C PO) Take 1 tablet by mouth daily.    CALCIUM PO Take 1 tablet by mouth daily.   Cholecalciferol (VITAMIN D PO) Take 2 capsules by mouth daily.   FOLIC ACID PO Take 1 tablet by mouth daily.    hydrocortisone 1 % ointment APPLY TO HEMORRHOID 3 TO 4 TIMES A DAY   MAGNESIUM PO Take 1 tablet by mouth daily.   metoprolol succinate (TOPROL-XL) 100 MG 24 hr tablet Take 1.5 tablets (150 mg total) by mouth daily. Take with or immediately following a meal.   Multiple Vitamin (MULTIVITAMIN) tablet Take 1 tablet by mouth daily.   Multiple Vitamins-Minerals (ZINC PO) Take by mouth. daily   pantoprazole (PROTONIX) 40 MG tablet Take 1 tablet Daily with Supper for Indigestion & Acid Reflux (Patient taking differently: Take 40 mg by mouth daily as needed (acid reflux).)   tretinoin (RETIN-A) 0.025 % cream Please specify directions, refills and quantity (Patient taking differently: Apply 1 Application topically daily as needed (wrinkles). Please  specify directions, refills and quantity)     Allergies:   Levsin [hyoscyamine sulfate], Mobic [meloxicam], Topamax [topiramate], Zoloft [sertraline hcl], Actonel [risedronate sodium], Boniva [ibandronic acid], Evista [raloxifene], and Fosamax [alendronate sodium]   Social History   Socioeconomic History   Marital status: Widowed    Spouse name: Not on file   Number of children: Not on file   Years of education: Not on file   Highest education level: Not on file  Occupational History   Occupation: Part-time at [employer]  Tobacco Use   Smoking status: Former    Current packs/day: 0.00    Types: Cigarettes    Quit date: 10/09/1975    Years since quitting: 47.8   Smokeless tobacco: Never  Vaping Use   Vaping status: Never Used  Substance and Sexual Activity  Alcohol use: No   Drug use: No   Sexual activity: Never    Birth control/protection: Surgical, Post-menopausal  Other Topics Concern   Not on file  Social History Narrative   Lives alone. Dtr in the area.   Social Determinants of Health   Financial Resource Strain: Not on file  Food Insecurity: Not on file  Transportation Needs: Not on file  Physical Activity: Not on file  Stress: Not on file  Social Connections: Not on file     Family History: The patient's family history includes Aneurysm in her brother; Breast cancer (age of onset: 36) in her sister; Cancer in her father; Diabetes in her brother, brother, and mother; Heart disease in her brother, brother, and mother; Hypertension in her brother; Stroke in her brother.  ROS:   Please see the history of present illness.     EKGs/Labs/Other Studies Reviewed:    The following studies were reviewed today:  Cardiac Studies & Procedures     STRESS TESTS  MYOCARDIAL PERFUSION IMAGING 11/05/2020  Narrative  Nuclear stress EF: 69%.  Blood pressure demonstrated a normal response to exercise.  The study is normal.  This is a low risk study.  The left  ventricular ejection fraction is hyperdynamic (>65%).  Negative nuclear medicine stress test for ischemia or infarction.   ECHOCARDIOGRAM  ECHOCARDIOGRAM COMPLETE 05/19/2022  Narrative ECHOCARDIOGRAM REPORT    Patient Name:   Nathifa MCNEILL White Date of Exam: 05/19/2022 Medical Rec #:  [id number]              Height:       65.0 in Accession #:    [id number]             Weight:       167.6 lb Date of Birth:  Mar 19, 1941             BSA:          1.835 m Patient Age:    80 years               BP:           130/80 mmHg Patient Gender: F                      HR:           60 bpm. Exam Location:  [address]  Procedure: 2D Echo, Cardiac Doppler, Color Doppler and Strain Analysis  Indications:     I42.1 HOCM I34.0 Mitral Regurgitation  History:         Patient has prior history of Echocardiogram examinations, most recent 12/09/2020. HOCM, Pacemaker, Arrythmias:LBBB and Paroxysmal atrial fibrillation., Signs/Symptoms:Chest Pain; Risk Factors:Hypertension, Dyslipidemia and Pre-diabetes. Thoracic Aortic Aneurysm.  Sonographer:     Daphine Deutscher RDCS Referring Phys:  [id number] Kathlynn Grate P[place]ON Diagnosing Phys: [place]  IMPRESSIONS   1. Left ventricular ejection fraction, by estimation, is 60 to 65%. The left ventricle has normal function. The left ventricle has no regional wall motion abnormalities. There is moderate asymmetric left ventricular hypertrophy of the basal-septal segment. Left ventricular diastolic parameters are consistent with Grade I diastolic dysfunction (impaired relaxation). The average left ventricular global longitudinal strain is -17.2 % (borderline) 2. Right ventricular systolic function is normal. The right ventricular size is not well visualized. There is mildly elevated pulmonary artery systolic pressure. 3. The mitral valve is grossly normal. Mild mitral valve regurgitation. 4. The aortic valve is tricuspid. Aortic valve regurgitation is  trivial.  No aortic stenosis is present. 5. Aortic ascending aortic aneurysm (45 mm). 6. Left atrial size was mildly dilated. 7. The inferior vena cava is normal in size with greater than 50% respiratory variability, suggesting right atrial pressure of 3 mmHg.  Conclusion(s)/Recommendation(s): Compared to prior study, MR has improved.  FINDINGS Left Ventricle: Left ventricular ejection fraction, by estimation, is 60 to 65%. The left ventricle has normal function. The left ventricle has no regional wall motion abnormalities. The average left ventricular global longitudinal strain is -17.2 %. The left ventricular internal cavity size was normal in size. There is severe asymmetric left ventricular hypertrophy of the basal-septal segment. Left ventricular diastolic parameters are consistent with Grade I diastolic dysfunction (impaired relaxation).  Right Ventricle: The right ventricular size is not well visualized. Right vetricular wall thickness was not well visualized. Right ventricular systolic function is normal. There is mildly elevated pulmonary artery systolic pressure. The tricuspid regurgitant velocity is 2.97 m/s, and with an assumed right atrial pressure of 3 mmHg, the estimated right ventricular systolic pressure is 38.3 mmHg.  Left Atrium: Left atrial size was mildly dilated.  Right Atrium: Right atrial size was normal in size.  Pericardium: There is no evidence of pericardial effusion.  Mitral Valve: The mitral valve is grossly normal. Mild mitral valve regurgitation.  Tricuspid Valve: The tricuspid valve is normal in structure. Tricuspid valve regurgitation is mild.  Aortic Valve: The aortic valve is tricuspid. Aortic valve regurgitation is trivial. Aortic regurgitation PHT measures 760 msec. No aortic stenosis is present.  Pulmonic Valve: The pulmonic valve was not well visualized. Pulmonic valve regurgitation is trivial.  Aorta: Ascending aortic aneurysm (45 mm).  Venous:  The inferior vena cava is normal in size with greater than 50% respiratory variability, suggesting right atrial pressure of 3 mmHg.  IAS/Shunts: No atrial level shunt detected by color flow Doppler.  Additional Comments: A device lead is visualized.   LEFT VENTRICLE PLAX 2D LVIDd:         3.80 cm   Diastology LVIDs:         2.70 cm   LV e' medial:    4.57 cm/s LV PW:         1.00 cm   LV E/e' medial:  12.5 LV IVS:        1.40 cm   LV e' lateral:   4.79 cm/s LVOT diam:     1.90 cm   LV E/e' lateral: 11.9 LV SV:         80 LV SV Index:   43        2D Longitudinal Strain LVOT Area:     2.84 cm  2D Strain GLS (A2C):   -17.0 % 2D Strain GLS (A3C):   -16.8 % 2D Strain GLS (A4C):   -17.9 % 2D Strain GLS Avg:     -17.2 %  RIGHT VENTRICLE             IVC RV Basal diam:  5.20 cm     IVC diam: 1.00 cm RV S prime:     12.15 cm/s TAPSE (M-mode): 1.6 cm  LEFT ATRIUM             Index        RIGHT ATRIUM           Index LA diam:        3.70 cm 2.02 cm/m   RA Area:     16.70 cm LA Vol (A2C):   45.1 ml  24.58 ml/m  RA Volume:   47.30 ml  25.78 ml/m LA Vol (A4C):   36.8 ml 20.05 ml/m LA Biplane Vol: 42.5 ml 23.16 ml/m AORTIC VALVE LVOT Vmax:   133.00 cm/s LVOT Vmean:  89.200 cm/s LVOT VTI:    0.282 m AI PHT:      760 msec  AORTA Ao Root diam: 3.60 cm Ao Asc diam:  4.50 cm  MITRAL VALVE               TRICUSPID VALVE MV Area (PHT): 2.80 cm    TR Peak grad:   35.3 mmHg MV Decel Time: 271 msec    TR Vmax:        297.00 cm/s MV E velocity: 57.10 cm/s MV A velocity: 84.90 cm/s  SHUNTS MV E/A ratio:  0.67        Systemic VTI:  0.28 m Systemic Diam: 1.90 cm  Carolan Clines Electronically signed by Carolan Clines Signature Date/Time: 05/19/2022/5:16:27 PM    Final (Updated)                   Recent Labs: 01/23/2023: TSH 0.76 04/26/2023: Magnesium 2.2 05/31/2023: ALT 16; BUN 11; Creat 0.82; Hemoglobin 13.0; Platelets 200; Potassium 4.3; Sodium 143  Recent Lipid Panel    Component  Value Date/Time   CHOL 139 04/26/2023 1157   TRIG 110 04/26/2023 1157   HDL 54 04/26/2023 1157   CHOLHDL 2.6 04/26/2023 1157   VLDL 20 04/11/2017 1025   LDLCALC 65 04/26/2023 1157        Physical Exam:    VS:  BP 122/80   Pulse 60   Ht 5\' 5"  (1.651 m)   SpO2 96%   BMI 29.35 kg/m     Wt Readings from Last 3 Encounters:  05/31/23 176 lb 6.4 oz (80 kg)  05/19/23 178 lb 12.8 oz (81.1 kg)  04/26/23 175 lb 12.8 oz (79.7 kg)     GEN:  Well nourished, well developed mild anxiety HEENT: Normal NECK: No JVD CARDIAC: RRR, distant heart sounds with holosystlic murmurs, no  rubs, gallops RESPIRATORY:  Clear to auscultation without rales, wheezing or rhonchi  ABDOMEN: Soft, non-tender, non-distended MUSCULOSKELETAL:  Non pitting edema; No deformity  SKIN: Warm and dry NEUROLOGIC:  Alert and oriented x 3 PSYCHIATRIC:  Normal affect   ASSESSMENT:    No diagnosis found. PLAN:    Hypertrophic Cardiomyopathy vs Basal septal hypertrophy - possible septal variant - with central MR - suspicion of Fabry's/Danon or other mimics of HCM: moderate; going back I have not seen   - NYHA II - Non HCM Contributors to disease/status Anxiety- despite her long visit, I do not have a great sense of her symptoms, as she has many symptoms across her review of symptoms - mitral and tricuspid valve disease- needs CMR  Family history, Discussed family screening, but will wait to screen daugther until after CMR  SCD  Assessment - her mother died of SCD; she is 48 and has CHB, we will not screen for SCD risk at this time  Atrial fibrillation (rare) - continue eliquis (CHADSVASC NA)  Medication symptom plan - we will decrease metoprolol due to fatigue - if BP elevates add hydrochlorothiazide 25 mg PO daily and BMP in two weeks - if we can confirm CMR we will attempt stress echo for symptoms  Hypertension -Consider adding diuretic if blood pressure increases or if patient reports increased  swelling.  Ascending Aortic Dilation -Plan for further assessment based  on MRI results.      Time Spent Directly with Patient:   I have spent a total of 45 minutes with the patient reviewing notes, imaging, EKGs, labs and examining the patient as well as establishing an assessment and plan that was discussed personally with the patient.  > 50% of time was spent in direct patient care and reviewing imaging with patient .    Medication Adjustments/Labs and Tests Ordered: Current medicines are reviewed at length with the patient today.  Concerns regarding medicines are outlined above.  No orders of the defined types were placed in this encounter.  No orders of the defined types were placed in this encounter.   There are no Patient Instructions on file for this visit.   Signed, Christell Constant, MD  07/27/2023 9:32 AM    [facility]

## 2023-07-27 NOTE — Patient Instructions (Signed)
Medication Instructions:  Your physician has recommended you make the following change in your medication:  DECREASE: metoprolol succinate (Toprol- XL) to 100 mg by mouth once daily  *If you need a refill on your cardiac medications before your next appointment, please call your pharmacy*   Lab Work: NONE If you have labs (blood work) drawn today and your tests are completely normal, you will receive your results only by: MyChart Message (if you have MyChart) OR A paper copy in the mail If you have any lab test that is abnormal or we need to change your treatment, we will call you to review the results.   Testing/Procedures: Your physician has requested that you have a cardiac MRI. Cardiac MRI uses a computer to create images of your heart as its beating, producing both still and moving pictures of your heart and major blood vessels. For further information please visit [email or web address]. Please follow the instruction sheet given to you today for more information.    Follow-Up: At [facility], you and your health needs are our priority.  As part of our continuing mission to provide you with exceptional heart care, we have created designated Provider Care Teams.  These Care Teams include your primary Cardiologist (physician) and Advanced Practice Providers (APPs -  Physician Assistants and Nurse Practitioners) who all work together to provide you with the care you need, when you need it.  We recommend signing up for the patient portal called "MyChart".  Sign up information is provided on this After Visit Summary.  MyChart is used to connect with patients for Virtual Visits (Telemedicine).  Patients are able to view lab/test results, encounter notes, upcoming appointments, etc.  Non-urgent messages can be sent to your provider as well.   To learn more about what you can do with MyChart, go to [email or web address].    Your next appointment:   4 month(s)  Provider:   Christell Constant, MD     Other Instructions  You are scheduled for Cardiac MRI on ______________. Please arrive for your appointment at ______________ ( arrive 30-45 minutes prior to test start time). ?  [facility] [address] [place], [place] [address] [phone] Please take advantage of the free valet parking available at the [facility] and [employer] (Entrance C).  Proceed to the [facility] Radiology Department (First Floor) for check-in.   OR   [facility] [address] [place], [place] [address] [phone] Please go to the [facility] and check-in with the desk attendant.   Magnetic resonance imaging (MRI) is a painless test that produces images of the inside of the body without using Xrays.  During an MRI, strong magnets and radio waves work together in a Data processing manager to form detailed images.   MRI images may provide more details about a medical condition than X-rays, CT scans, and ultrasounds can provide.  You may be given earphones to listen for instructions.  You may eat a light breakfast and take medications as ordered with the exception of furosemide, hydrochlorothiazide, or spironolactone(fluid pill, other). Please avoid stimulants for 12 hr prior to test. (Ie. Caffeine, nicotine, chocolate, or antihistamine medications)  An IV will be inserted into one of your veins. Contrast material will be injected into your IV. It will leave your body through your urine within a day. You may be told to drink plenty of fluids to help flush the contrast material out of your system.  You will be  asked to remove all metal, including: Watch, jewelry, and other metal objects including hearing aids, hair pieces and dentures. Also wearable glucose monitoring systems (ie. Freestyle Libre and Omnipods) (Braces and fillings normally are not a problem.)   TEST WILL TAKE APPROXIMATELY 1 HOUR  PLEASE NOTIFY SCHEDULING AT LEAST 24 HOURS  IN ADVANCE IF YOU ARE UNABLE TO KEEP YOUR APPOINTMENT. [phone]  For more information and frequently asked questions, please visit our website : [email or web address]  Please call the Cardiac Imaging Nurse Navigators with any questions/concerns. [phone] Office

## 2023-07-31 ENCOUNTER — Encounter: Payer: Self-pay | Admitting: Internal Medicine

## 2023-07-31 NOTE — Progress Notes (Signed)
Future Appointments  Date Time Provider Department  08/01/2023                   6 mo ov  11:30 AM Lucky Cowboy, MD [facility]  11/07/2023 11:00 AM Raynelle Dick, NP [facility]  11/22/2023 11:20 AM Christell Constant, MD [facility]  02/06/2024                   cpe 11:00 AM Lucky Cowboy, MD [facility]  04/25/2024                  wellness 11:00 AM Raynelle Dick, NP [facility]    History of Present Illness:       This very nice 82 y.o. WWF  with HTN, HLD, Prediabetes  and Vitamin D Deficiency presents for 6 month f/u.   In Feb 2023,  Chest CT showed Aortic Atherosclerosis & an ascending thoracic aortic aneurysm followed by Cardiology. Patient has GERD controlled with Diet & Pantoprazole.         Patient is treated for HTN  (2005)   & BP has been controlled at home. Today's BP is at goal - 110/72. In 2014 , patient presented asymptomatic with CHB and ultimately had PPM implanted.   Myoview was negative in Jan 2014 as was heart cath in Aug 2014. ETT in 2016 was also Negative.  In 2019 , she was discovered in pAfib and was started on Eliquis - later  d/c'd in Oct 2020. Dr Shirlee Latch follows patient for HCM /ASH and  Ao Arch Thoracic Aneurysm . Patient has had no complaints of any cardiac type chest pain, palpitations, dyspnea Pollyann Kennedy /PND, dizziness, claudication or dependent edema.        Hyperlipidemia is controlled with diet . Last Lipids were at goal :  Lab Results  Component Value Date   CHOL 139 04/26/2023   HDL 54 04/26/2023   LDLCALC 65 04/26/2023   TRIG 110 04/26/2023   CHOLHDL 2.6 04/26/2023     Also, the patient has history of prediabetes (A1c 5.9% /2011 & 2016)  and has had no symptoms of reactive hypoglycemia, diabetic polys, paresthesias or visual blurring.  Last A1c was near goal :   Lab Results  Component Value Date   HGBA1C 5.8 (H) 01/23/2023                                                     Further, the patient also has history of  Vitamin D Deficiency ("34" /2008) and supplements vitamin D .   Last vitamin D  at goal :   Lab Results  Component Value Date   VD25OH 20 01/23/2023     Current Outpatient Medications on File Prior to Visit  Medication Sig   acetaminophen  500 MG tablet Take  as needed.   apixaban (ELIQUIS) 5 MG  Take 1 tablet  2  times daily.   VITAMIN C Take 1 tablet  daily.    CALCIUM PO Take 1 tablet daily.   VITAMIN D  Take 2 capsules  daily.   FOLIC ACID PO Take 1 tablet daily.    hydrocortisone 1 % ointment APPLY TO HEMORRHOID    MAGNESIUM PO Take 1 tabletdaily.   metoprolol succ-XL 100 MG  Take 1 tablet daily-> 1& 1/2 tab (150  mg) /day    Multiple Vitamin  Take 1 tablet  daily.   Multiple Vitamins-Minerals  Take  daily   pantoprazole 40 MG tablet Take 1 tablet Daily  as needed   RETIN-A 0.025 % cream Apply   daily      Allergies  Allergen Reactions   Levsin [Hyoscyamine Sulfate]    Mobic [Meloxicam] unknown   Zoloft [Sertraline Hcl] unknown   Actonel [Risedronate Sodium] Other (See Comments)    Pt didn't like the way it made her feel   Boniva [Ibandronic Acid] Other (See Comments)    Pt didn't like the way it made her feel   Evista [Raloxifene] Other (See Comments)    Pt didn't like the way it made her feel   Fosamax [Alendronate Sodium] Other (See Comments)    Pt didn't like the way it made her feel     PMHx:   Past Medical History:  Diagnosis Date   Anxiety    Atrophic vaginitis    Chest pain    a. Adenosine sestamibi (1/14) with no evidence of ischemia or infarction, EF 63%.    Colon polyps    Complete heart block (HCC) 06-2013   status post pacemaker implantation by Dr Johney Frame 06-11-2013   Depression    DI (detrusor instability)    GERD (gastroesophageal reflux disease)    History + Hyplori via EGD   H/O echocardiogram    a. Echo 11/2012:EF 50-55%, focal basal septal hypertrophy with some mitral valve SAM (mild) and MR.  This may be a sigmoid septum with advanced age or  could be a variant of hypertrophic cardiomyopathy.   HOCM (hypertrophic obstructive cardiomyopathy) (HCC)    Echocardiogram 2/22: EF 60-65, no RWMA, severe asymmetric septal LVH, no LVOT, Gr 1 DD, mild SAM, mod MR, mild LAE, mod to severe TR, Asc Ao 42 mm, normal RVSF, RVSP 28   HTN (hypertension)    Hyperlipidemia    LBBB (left bundle branch block)    Leukopenia 05/19/2014   Lymphocytosis 05/19/2014   Migraines    Myoview    Myoview 12/21: EF 69, no ischemia or infarction; low risk    Osteoporosis    Paroxysmal atrial fibrillation (HCC)    Prediabetes    Thoracic aortic aneurysm (HCC)    Chest CTA 2/22: Ascending thoracic aortic aneurysm 44 mm; Ao arch 34 mm>>repeat 1 year   Vitamin D deficiency      Immunization History  Administered Date(s) Administered   Fluad Quad(high Dose) 07/25/2019   Influenza Split 08/06/2014, 09/20/2017   Influenza, High Dose 09/01/2015, 08/19/2016, 08/29/2022   Influenza- 09/12/2018   PNEUMOCOCCAL CONJ-20 08/29/2022   Pneumococcal -13 09/01/2015   Pneumococcal-23 10/08/2010   Td 05/09/2011   Zoster, Live 05/09/2011     Past Surgical History:  Procedure Laterality Date   BREAST BIOPSY Left 07/25/2019   ANGIOLIPOMA   BREAST EXCISIONAL BIOPSY Right    BREAST SURGERY     Breast cyst   CATARACT EXTRACTION Left 2019   Dr. Dione Booze   HEMORRHOID SURGERY     LEFT HEART CATHETERIZATION WITH CORONARY ANGIOGRAM N/A 06/11/2013   Procedure: LEFT HEART CATHETERIZATION WITH CORONARY ANGIOGRAM;  Surgeon: Kathleene Hazel, MD;  Location: [facility] CATH LAB;  Service: Cardiovascular;  Laterality: N/A;   PACEMAKER INSERTION  06-11-2013   STJ dual chamber pacemaker implanted by Dr Johney Frame for complete heart block   PERMANENT PACEMAKER INSERTION Left 06/11/2013   Procedure: PERMANENT PACEMAKER INSERTION;  Surgeon: Kathleene Hazel, MD;  Location: [facility] CATH LAB;  Service: Cardiovascular;  Laterality: Left;   PERMANENT PACEMAKER INSERTION N/A 06/11/2013   Procedure:  PERMANENT PACEMAKER INSERTION;  Surgeon: Hillis Range, MD;  Location: [facility] CATH LAB;  Service: Cardiovascular;  Laterality: N/A;   PPM GENERATOR CHANGEOUT N/A 11/25/2022   Procedure: PPM GENERATOR CHANGEOUT;  Surgeon: Regan Lemming, MD;  Location: [facility] INVASIVE CV LAB;  Service: Cardiovascular;  Laterality: N/A;   TEMPORARY PACEMAKER INSERTION N/A 06/11/2013   Procedure: TEMPORARY PACEMAKER INSERTION;  Surgeon: Kathleene Hazel, MD;  Location: [facility] CATH LAB;  Service: Cardiovascular;  Laterality: N/A;   TUBAL LIGATION      FHx:    Reviewed / unchanged   SHx:    Reviewed / unchanged    Systems Review:  Constitutional: Denies fever, chills, wt changes, headaches, insomnia, fatigue, night sweats, change in appetite. Eyes: Denies redness, blurred vision, diplopia, discharge, itchy, watery eyes.  ENT: Denies discharge, congestion, post nasal drip, epistaxis, sore throat, earache, hearing loss, dental pain, tinnitus, vertigo, sinus pain, snoring.  CV: Denies chest pain, palpitations, irregular heartbeat, syncope, dyspnea, diaphoresis, orthopnea, PND, claudication or edema. Respiratory: denies cough, dyspnea, DOE, pleurisy, hoarseness, laryngitis, wheezing.  Gastrointestinal: Denies dysphagia, odynophagia, heartburn, reflux, water brash, abdominal pain or cramps, nausea, vomiting, bloating, diarrhea, constipation, hematemesis, melena, hematochezia  or hemorrhoids. Genitourinary: Denies dysuria, frequency, urgency, nocturia, hesitancy, discharge, hematuria or flank pain. Musculoskeletal: Denies arthralgias, myalgias, stiffness, jt. swelling, pain, limping or strain/sprain.  Skin: Denies pruritus, rash, hives, warts, acne, eczema or change in skin lesion(s). Neuro: No weakness, tremor, incoordination, spasms, paresthesia or pain. Psychiatric: Denies confusion, memory loss or sensory loss. Endo: Denies change in weight, skin or hair change.  Heme/Lymph: No excessive bleeding, bruising or  enlarged lymph nodes.   Physical Exam  BP 110/72   Pulse 70   Temp 97.7 F (36.5 C)   Resp 16   Ht 5\' 5"  (1.651 m)   Wt 177 lb 3.2 oz (80.4 kg)   SpO2 96%   BMI 29.49 kg/m   Appears  well nourished, well groomed  and in no distress.  Eyes: PERRLA, EOMs, conjunctiva no swelling or erythema. Sinuses: No frontal/maxillary tenderness ENT/Mouth: EAC's clear, TM's nl w/o erythema, bulging. Nares clear w/o erythema, swelling, exudates. Oropharynx clear without erythema or exudates. Oral hygiene is good. Tongue normal, non obstructing. Hearing intact.  Neck: Supple. Thyroid not palpable. Car 2+/2+ without bruits, nodes or JVD. Chest: Respirations nl with BS clear & equal w/o rales, rhonchi, wheezing or stridor.  Cor: Heart sounds normal w/ regular rate and rhythm without sig. murmurs, gallops, clicks or rubs. Peripheral pulses normal and equal  without edema.  Abdomen: Soft & bowel sounds normal. Non-tender w/o guarding, rebound, hernias, masses or organomegaly.  Lymphatics: Unremarkable.  Musculoskeletal: Full ROM all peripheral extremities, joint stability, 5/5 strength and normal gait.  Skin: Warm, dry without exposed rashes, lesions or ecchymosis apparent.  Neuro: Cranial nerves intact, reflexes equal bilaterally. Sensory-motor testing grossly intact. Tendon reflexes grossly intact.  Pysch: Alert & oriented x 3.  Insight and judgement nl & appropriate. No ideations.   Assessment and Plan:   1. Essential hypertension  - Continue medication, monitor blood pressure at home.  - Continue DASH diet.  Reminder to go to the ER if any CP,  SOB, nausea, dizziness, severe HA, changes vision/speech.   - CBC with Differential/Platelet - COMPLETE METABOLIC PANEL WITH GFR - Magnesium - TSH   2. Hyperlipidemia, mixed  - Continue diet/meds, exercise,& lifestyle modifications.  -  Continue monitor periodic cholesterol/liver & renal functions    - Lipid panel - TSH   3. Abnormal  glucose  - Continue diet, exercise  - Lifestyle modifications.  - Monitor appropriate labs   - Hemoglobin A1c - Insulin, random   4. Vitamin D deficiency  - Continue supplementation.   - VITAMIN D 25 Hydroxy    5. Thoracic aortic aneurysm without rupture (HCC)  - Lipid panel   6. CHB (complete heart block) (HCC)   7. Pacemaker   8. Paroxysmal atrial fibrillation (HCC)  - TSH  9. Gastroesophageal reflux disease  - CBC with Differential/Platelet   10. Medication management  - CBC with Differential/Platelet - COMPLETE METABOLIC PANEL WITH GFR - Magnesium - Lipid panel - TSH - Hemoglobin A1c - Insulin, random - VITAMIN D 25 Hydroxy          Discussed  regular exercise, BP monitoring, weight control to achieve/maintain BMI less than 25 and discussed med and SE's. Recommended labs to assess /monitor clinical status .  I discussed the assessment and treatment plan with the patient. The patient was provided an opportunity to ask questions and all were answered. The patient agreed with the plan and demonstrated an understanding of the instructions.  I provided over 30 minutes of exam, counseling, chart review and  complex critical decision making.        The patient was advised to call back or seek an in-person evaluation if the symptoms worsen or if the condition fails to improve as anticipated.   Marinus Maw, MD

## 2023-07-31 NOTE — Patient Instructions (Signed)
Due to recent changes in healthcare laws, you may see the results of your imaging and laboratory studies on MyChart before your provider has had a chance to review them.  We understand that in some cases there may be results that are confusing or concerning to you. Not all laboratory results come back in the same time frame and the provider may be waiting for multiple results in order to interpret others.  Please give [place] 48 hours in order for your provider to thoroughly review all the results before contacting the office for clarification of your results.  ++++++++++++++++++++++++++  Vit D  & Vit C 1,000 mg   are recommended to help protect  against the Covid-19 and other Corona viruses.    Also it's recommended  to take  Zinc 50 mg  to help  protect against the Covid-19   and best place to get  is also on [employer].[email or web address]  and don't pay more than 6-8 cents /pill !   +++++++++++++++++++++++++++++++++++++++ Recommend Adult Low Dose Aspirin or  coated  Aspirin 81 mg daily  To reduce risk of Colon Cancer 40 %,  Skin Cancer 26 % ,  Melanoma 46%  and  Pancreatic cancer 60% +++++++++++++++++++++++++++++++++++++++++ Vitamin D goal  is between 70-100.  Please make sure that you are taking your Vitamin D as directed.  It is very important as a natural anti-inflammatory  helping hair, skin, and nails, as well as reducing stroke and heart attack risk.  It helps your bones and helps with mood. It also decreases numerous cancer risks so please take it as directed.  Low Vit D is associated with a 200-300% higher risk for CANCER  and 200-300% higher risk for HEART   ATTACK  &  STROKE.   .....................................Marland Kitchen It is also associated with higher death rate at younger ages,  autoimmune diseases like Rheumatoid arthritis, Lupus, Multiple Sclerosis.    Also many other serious conditions, like depression, Alzheimer's Dementia, infertility, muscle aches, fatigue, fibromyalgia - just to name  a few. +++++++++++++++++++++++++++++++++++++++++ Recommend the book "The END of DIETING" by Dr Monico Hoar  & the book "The END of DIABETES " by Dr Monico Hoar At [email or web address] - get book & Audio CD's    Being diabetic has a  300% increased risk for heart attack, stroke, cancer, and alzheimer- type vascular dementia. It is very important that you work harder with diet by avoiding all foods that are white. Avoid white rice (brown & wild rice is OK), white potatoes (sweetpotatoes in moderation is OK), White bread or wheat bread or anything made out of white flour like bagels, donuts, rolls, buns, biscuits, cakes, pastries, cookies, pizza crust, and pasta (made from white flour & egg whites) - vegetarian pasta or spinach or wheat pasta is OK. Multigrain breads like Arnold's or Pepperidge Farm, or multigrain sandwich thins or flatbreads.  Diet, exercise and weight loss can reverse and cure diabetes in the early stages.  Diet, exercise and weight loss is very important in the control and prevention of complications of diabetes which affects every system in your body, ie. Brain - dementia/stroke, eyes - glaucoma/blindness, heart - heart attack/heart failure, kidneys - dialysis, stomach - gastric paralysis, intestines - malabsorption, nerves - severe painful neuritis, circulation - gangrene & loss of a leg(s), and finally cancer and Alzheimers.    I recommend avoid fried & greasy foods,  sweets/candy, white rice (brown or wild rice or Quinoa is OK), white potatoes (sweet potatoes are OK) - anything  made from white flour - bagels, doughnuts, rolls, buns, biscuits,white and wheat breads, pizza crust and traditional pasta made of white flour & egg white(vegetarian pasta or spinach or wheat pasta is OK).  Multi-grain bread is OK - like multi-grain flat bread or sandwich thins. Avoid alcohol in excess. Exercise is also important.    Eat all the vegetables you want - avoid meat, especially red meat and dairy - especially  cheese.  Cheese is the most concentrated form of trans-fats which is the worst thing to clog up our arteries. Veggie cheese is OK which can be found in the fresh produce section at [employer] or [employer] or [employer]  +++++++++++++++++++++++++++++++++++++++ DASH Eating Plan  DASH stands for "Dietary Approaches to Stop Hypertension."   The DASH eating plan is a healthy eating plan that has been shown to reduce high blood pressure (hypertension). Additional health benefits may include reducing the risk of type 2 diabetes mellitus, heart disease, and stroke. The DASH eating plan may also help with weight loss. WHAT DO I NEED TO KNOW ABOUT THE DASH EATING PLAN? For the DASH eating plan, you will follow these general guidelines: Choose foods with a percent daily value for sodium of less than 5% (as listed on the food label). Use salt-free seasonings or herbs instead of table salt or sea salt. Check with your health care provider or pharmacist before using salt substitutes. Eat lower-sodium products, often labeled as "lower sodium" or "no salt added." Eat fresh foods. Eat more vegetables, fruits, and low-fat dairy products. Choose whole grains. Look for the word "whole" as the first word in the ingredient list. Choose fish  Limit sweets, desserts, sugars, and sugary drinks. Choose heart-healthy fats. Eat veggie cheese  Eat more home-cooked food and less restaurant, buffet, and fast food. Limit fried foods. Cook foods using methods other than frying. Limit canned vegetables. If you do use them, rinse them well to decrease the sodium. When eating at a restaurant, ask that your food be prepared with less salt, or no salt if possible.                      WHAT FOODS CAN I EAT? Read Dr Francis Dowse Fuhrman's books on The End of Dieting & The End of Diabetes  Grains Whole grain or whole wheat bread. Brown rice. Whole grain or whole wheat pasta. Quinoa, bulgur, and whole grain cereals. Low-sodium  cereals. Corn or whole wheat flour tortillas. Whole grain cornbread. Whole grain crackers. Low-sodium crackers.  Vegetables Fresh or frozen vegetables (raw, steamed, roasted, or grilled). Low-sodium or reduced-sodium tomato and vegetable juices. Low-sodium or reduced-sodium tomato sauce and paste. Low-sodium or reduced-sodium canned vegetables.   Fruits All fresh, canned (in natural juice), or frozen fruits.  Protein Products  All fish and seafood.  Dried beans, peas, or lentils. Unsalted nuts and seeds. Unsalted canned beans.  Dairy Low-fat dairy products, such as skim or 1% milk, 2% or reduced-fat cheeses, low-fat ricotta or cottage cheese, or plain low-fat yogurt. Low-sodium or reduced-sodium cheeses.  Fats and Oils Tub margarines without trans fats. Light or reduced-fat mayonnaise and salad dressings (reduced sodium). Avocado. Safflower, olive, or canola oils. Natural peanut or almond butter.  Other Unsalted popcorn and pretzels. The items listed above may not be a complete list of recommended foods or beverages. Contact your dietitian for more options.  +++++++++++++++  WHAT FOODS ARE NOT RECOMMENDED? Grains/ White flour or wheat flour White bread. White pasta. White rice. Refined  cornbread. Bagels and croissants. Crackers that contain trans fat.  Vegetables  Creamed or fried vegetables. Vegetables in a . Regular canned vegetables. Regular canned tomato sauce and paste. Regular tomato and vegetable juices.  Fruits Dried fruits. Canned fruit in light or heavy syrup. Fruit juice.  Meat and Other Protein Products Meat in general - RED meat & White meat.  Fatty cuts of meat. Ribs, chicken wings, all processed meats as bacon, sausage, bologna, salami, fatback, hot dogs, bratwurst and packaged luncheon meats.  Dairy Whole or 2% milk, cream, half-and-half, and cream cheese. Whole-fat or sweetened yogurt. Full-fat cheeses or blue cheese. Non-dairy creamers and whipped toppings.  Processed cheese, cheese spreads, or cheese curds.  Condiments Onion and garlic salt, seasoned salt, table salt, and sea salt. Canned and packaged gravies. Worcestershire sauce. Tartar sauce. Barbecue sauce. Teriyaki sauce. Soy sauce, including reduced sodium. Steak sauce. Fish sauce. Oyster sauce. Cocktail sauce. Horseradish. Ketchup and mustard. Meat flavorings and tenderizers. Bouillon cubes. Hot sauce. Tabasco sauce. Marinades. Taco seasonings. Relishes.  Fats and Oils Butter, stick margarine, lard, shortening and bacon fat. Coconut, palm kernel, or palm oils. Regular salad dressings.  Pickles and olives. Salted popcorn and pretzels.  The items listed above may not be a complete list of foods and beverages to avoid.

## 2023-08-01 ENCOUNTER — Ambulatory Visit (INDEPENDENT_AMBULATORY_CARE_PROVIDER_SITE_OTHER): Payer: Medicare PPO | Admitting: Internal Medicine

## 2023-08-01 ENCOUNTER — Encounter: Payer: Self-pay | Admitting: Internal Medicine

## 2023-08-01 VITALS — BP 110/72 | HR 70 | Temp 97.7°F | Resp 16 | Ht 65.0 in | Wt 177.2 lb

## 2023-08-01 DIAGNOSIS — E559 Vitamin D deficiency, unspecified: Secondary | ICD-10-CM | POA: Diagnosis not present

## 2023-08-01 DIAGNOSIS — E782 Mixed hyperlipidemia: Secondary | ICD-10-CM | POA: Diagnosis not present

## 2023-08-01 DIAGNOSIS — I442 Atrioventricular block, complete: Secondary | ICD-10-CM | POA: Diagnosis not present

## 2023-08-01 DIAGNOSIS — Z23 Encounter for immunization: Secondary | ICD-10-CM | POA: Diagnosis not present

## 2023-08-01 DIAGNOSIS — I712 Thoracic aortic aneurysm, without rupture, unspecified: Secondary | ICD-10-CM | POA: Diagnosis not present

## 2023-08-01 DIAGNOSIS — I480 Paroxysmal atrial fibrillation: Secondary | ICD-10-CM | POA: Diagnosis not present

## 2023-08-01 DIAGNOSIS — K219 Gastro-esophageal reflux disease without esophagitis: Secondary | ICD-10-CM | POA: Diagnosis not present

## 2023-08-01 DIAGNOSIS — Z950 Presence of cardiac pacemaker: Secondary | ICD-10-CM | POA: Diagnosis not present

## 2023-08-01 DIAGNOSIS — Z79899 Other long term (current) drug therapy: Secondary | ICD-10-CM

## 2023-08-01 DIAGNOSIS — I10 Essential (primary) hypertension: Secondary | ICD-10-CM

## 2023-08-01 DIAGNOSIS — R7309 Other abnormal glucose: Secondary | ICD-10-CM

## 2023-08-02 LAB — COMPLETE METABOLIC PANEL WITH GFR
AG Ratio: 1.6 (calc) (ref 1.0–2.5)
ALT: 15 U/L (ref 6–29)
AST: 16 U/L (ref 10–35)
Albumin: 4.1 g/dL (ref 3.6–5.1)
Alkaline phosphatase (APISO): 104 U/L (ref 37–153)
BUN: 11 mg/dL (ref 7–25)
CO2: 30 mmol/L (ref 20–32)
Calcium: 9.8 mg/dL (ref 8.6–10.4)
Chloride: 107 mmol/L (ref 98–110)
Creat: 0.71 mg/dL (ref 0.60–0.95)
Globulin: 2.5 g/dL (calc) (ref 1.9–3.7)
Glucose, Bld: 100 mg/dL — ABNORMAL HIGH (ref 65–99)
Potassium: 4.4 mmol/L (ref 3.5–5.3)
Sodium: 144 mmol/L (ref 135–146)
Total Bilirubin: 0.6 mg/dL (ref 0.2–1.2)
Total Protein: 6.6 g/dL (ref 6.1–8.1)
eGFR: 85 mL/min/{1.73_m2} (ref >=60)

## 2023-08-02 LAB — CBC WITH DIFFERENTIAL/PLATELET
Absolute Monocytes: 338 cells/uL (ref 200–950)
Basophils Absolute: 30 cells/uL (ref 0–200)
Basophils Relative: 0.8 %
Eosinophils Absolute: 87 cells/uL (ref 15–500)
Eosinophils Relative: 2.3 %
HCT: 42.4 % (ref 35.0–45.0)
Hemoglobin: 13.1 g/dL (ref 11.7–15.5)
Lymphs Abs: 1820 cells/uL (ref 850–3900)
MCH: 27.3 pg (ref 27.0–33.0)
MCHC: 30.9 g/dL — ABNORMAL LOW (ref 32.0–36.0)
MCV: 88.3 fL (ref 80.0–100.0)
MPV: 10.6 fL (ref 7.5–12.5)
Monocytes Relative: 8.9 %
Neutro Abs: 1524 cells/uL (ref 1500–7800)
Neutrophils Relative %: 40.1 %
Platelets: 205 10*3/uL (ref 140–400)
RBC: 4.8 10*6/uL (ref 3.80–5.10)
RDW: 12.6 % (ref 11.0–15.0)
Total Lymphocyte: 47.9 %
WBC: 3.8 10*3/uL (ref 3.8–10.8)

## 2023-08-02 LAB — LIPID PANEL
Cholesterol: 191 mg/dL (ref <200)
HDL: 47 mg/dL — ABNORMAL LOW (ref >=50)
LDL Cholesterol (Calc): 120 mg/dL (calc) — ABNORMAL HIGH
Non-HDL Cholesterol (Calc): 144 mg/dL (calc) — ABNORMAL HIGH (ref <130)
Total CHOL/HDL Ratio: 4.1 (calc) (ref <5.0)
Triglycerides: 129 mg/dL (ref <150)

## 2023-08-02 LAB — VITAMIN D 25 HYDROXY (VIT D DEFICIENCY, FRACTURES): Vit D, 25-Hydroxy: 65 ng/mL (ref 30–100)

## 2023-08-02 LAB — INSULIN, RANDOM: Insulin: 8.3 u[IU]/mL

## 2023-08-02 LAB — TSH: TSH: 0.97 mIU/L (ref 0.40–4.50)

## 2023-08-02 LAB — MAGNESIUM: Magnesium: 2.3 mg/dL (ref 1.5–2.5)

## 2023-08-02 LAB — HEMOGLOBIN A1C
Hgb A1c MFr Bld: 6.1 % of total Hgb — ABNORMAL HIGH (ref <5.7)
Mean Plasma Glucose: 128 mg/dL
eAG (mmol/L): 7.1 mmol/L

## 2023-08-02 NOTE — Progress Notes (Signed)
<>*<>*<>*<>*<>*<>*<>*<>*<>*<>*<>*<>*<>*<>*<>*<>*<>*<>*<>*<>*<>*<>*<>*<>*<> <>*<>*<>*<>*<>*<>*<>*<>*<>*<>*<>*<>*<>*<>*<>*<>*<>*<>*<>*<>*<>*<>*<>*<>*<>  -Test results slightly outside the reference range are not unusual. If there is anything important, I will review this with you,  otherwise it is considered normal test values.  If you have further questions,  please do not hesitate to contact me at the office or via My Chart.   <>*<>*<>*<>*<>*<>*<>*<>*<>*<>*<>*<>*<>*<>*<>*<>*<>*<>*<>*<>*<>*<>*<>*<>*<> <>*<>*<>*<>*<>*<>*<>*<>*<>*<>*<>*<>*<>*<>*<>*<>*<>*<>*<>*<>*<>*<>*<>*<>*<>   -  Total  Chol =    191  - is slightly  Elevated             (  Ideal  or  Goal is less than 180  !  )  & -  Bad / Dangerous LDL  Chol = 120  - is very elevated              (  Ideal  or  Goal is less than 70  !  )   - Recommend a stricter  low cholesterol diet   - Cholesterol only comes from animal sources                                                                       - ie. meat, dairy, egg yolks  - Eat all the vegetables you want.  - Avoid Meat, Avoid Meat,  Avoid Meat                                                              - especially Red Meat - Beef AND Pork .  - Avoid cheese & dairy - milk & ice cream.     - Cheese is the most concentrated form of trans-fats which                                                                              is the worst thing to clog up our arteries.    - Veggie cheese is OK which can be found in the fresh                                                produce section at [employer] or [employer] or [employer]  <>*<>*<>*<>*<>*<>*<>*<>*<>*<>*<>*<>*<>*<>*<>*<>*<>*<>*<>*<>*<>*<>*<>*<>*<> <>*<>*<>*<>*<>*<>*<>*<>*<>*<>*<>*<>*<>*<>*<>*<>*<>*<>*<>*<>*<>*<>*<>*<>*<>  -  A1c has jumped up to 6.1%    - getting closer to being a full fledged Diabetic when it reaches 6.5%    !  <>*<>*<>*<>*<>*<>*<>*<>*<>*<>*<>*<>*<>*<>*<>*<>*<>*<>*<>*<>*<>*<>*<>*<>*<> <>*<>*<>*<>*<>*<>*<>*<>*<>*<>*<>*<>*<>*<>*<>*<>*<>*<>*<>*<>*<>*<>*<>*<>*<>  -  Being diabetic has a  300% increased risk for heart attack,  stroke, cancer, and alzheimer- type vascular dementia.   -  It is very important that you work harder with diet by                             avoiding all foods that are white except chicken, fish & calliflower.  - Avoid white rice  (brown & wild rice is OK),   - Avoid white potatoes  (sweet potatoes in moderation is OK),   White bread or wheat bread or anything made out of   white flour like bagels, donuts, rolls, buns, biscuits, cakes,  - pastries, cookies, pizza crust, and pasta (made from  white flour & egg whites)   - vegetarian pasta or spinach or wheat pasta is OK.  - Multigrain breads like Arnold's, Pepperidge Farm or   multigrain sandwich thins or high fiber breads like   [place] bread or "Dave's Killer" breads that are  4 to 5 grams fiber per slice !  are best.    Diet, exercise and weight loss can reverse and cure  diabetes in the early stages.    - Diet, exercise and weight loss is very important in the   control and prevention of complications of diabetes which  affects every system in your body  <>*<>*<>*<>*<>*<>*<>*<>*<>*<>*<>*<>*<>*<>*<>*<>*<>*<>*<>*<>*<>*<>*<>*<>*<> <>*<>*<>*<>*<>*<>*<>*<>*<>*<>*<>*<>*<>*<>*<>*<>*<>*<>*<>*<>*<>*<>*<>*<>*<>  -  Vitamin D = 65 - Excellent - Please keep dose same   <>*<>*<>*<>*<>*<>*<>*<>*<>*<>*<>*<>*<>*<>*<>*<>*<>*<>*<>*<>*<>*<>*<>*<>*<> <>*<>*<>*<>*<>*<>*<>*<>*<>*<>*<>*<>*<>*<>*<>*<>*<>*<>*<>*<>*<>*<>*<>*<>*<>  -  All Else - CBC - Kidneys - Electrolytes - Liver - Magnesium & Thyroid    - all  Normal /  OK  <>*<>*<>*<>*<>*<>*<>*<>*<>*<>*<>*<>*<>*<>*<>*<>*<>*<>*<>*<>*<>*<>*<>*<>*<> <>*<>*<>*<>*<>*<>*<>*<>*<>*<>*<>*<>*<>*<>*<>*<>*<>*<>*<>*<>*<>*<>*<>*<>*<>

## 2023-08-06 ENCOUNTER — Encounter: Payer: Self-pay | Admitting: Internal Medicine

## 2023-08-25 ENCOUNTER — Ambulatory Visit (INDEPENDENT_AMBULATORY_CARE_PROVIDER_SITE_OTHER): Payer: Medicare PPO

## 2023-08-25 DIAGNOSIS — I421 Obstructive hypertrophic cardiomyopathy: Secondary | ICD-10-CM

## 2023-08-26 LAB — CUP PACEART REMOTE DEVICE CHECK
Battery Remaining Longevity: 88 mo
Battery Remaining Percentage: 94 %
Battery Voltage: 2.99 V
Brady Statistic AP VP Percent: 89 %
Brady Statistic AP VS Percent: 1 %
Brady Statistic AS VP Percent: 11 %
Brady Statistic AS VS Percent: 1 %
Brady Statistic RA Percent Paced: 87 %
Brady Statistic RV Percent Paced: 99 %
Date Time Interrogation Session: 20241018054909
Implantable Lead Connection Status: 753985
Implantable Lead Connection Status: 753985
Implantable Lead Implant Date: 20140805
Implantable Lead Implant Date: 20140805
Implantable Lead Location: 753859
Implantable Lead Location: 753860
Implantable Lead Model: 1944
Implantable Lead Model: 1948
Implantable Pulse Generator Implant Date: 20240119
Lead Channel Impedance Value: 510 Ohm
Lead Channel Impedance Value: 580 Ohm
Lead Channel Pacing Threshold Amplitude: 0.25 V
Lead Channel Pacing Threshold Amplitude: 0.5 V
Lead Channel Pacing Threshold Pulse Width: 0.5 ms
Lead Channel Pacing Threshold Pulse Width: 0.8 ms
Lead Channel Sensing Intrinsic Amplitude: 12 mV
Lead Channel Sensing Intrinsic Amplitude: 3.2 mV
Lead Channel Setting Pacing Amplitude: 2 V
Lead Channel Setting Pacing Amplitude: 2.5 V
Lead Channel Setting Pacing Pulse Width: 0.5 ms
Lead Channel Setting Sensing Sensitivity: 4 mV
Pulse Gen Model: 2272
Pulse Gen Serial Number: 8145790

## 2023-09-08 NOTE — Progress Notes (Signed)
Remote pacemaker transmission.   

## 2023-09-12 ENCOUNTER — Telehealth: Payer: Self-pay | Admitting: Internal Medicine

## 2023-09-12 NOTE — Telephone Encounter (Signed)
Spoke with the patient and advised that she could take two of her 2.5 mg tablets twice daily until she ran out.

## 2023-09-12 NOTE — Telephone Encounter (Signed)
Patient is calling to talk with Dr. Izora Ribas or his nurse in regards to Eliquis. Currently have 2.5 MG due to running out of 5 MG and want to know if she can take 2 of the 2.5 MG

## 2023-09-18 ENCOUNTER — Telehealth: Payer: Self-pay | Admitting: Internal Medicine

## 2023-09-18 NOTE — Telephone Encounter (Signed)
Called pt in regards to Eliquis.  Pt reports takes Elqiuis 2.5 mg x 2 = 5mg  PO BID.   Reports has had dizziness and lightheadedness since starting medication.  Reports one morning last week can't remember if it was Cl[place] Cao, Fri, or Sat; had an episode after getting up to the BR; extreme lightheadedness, vision became blurry couldn't read clock also felt like bed was going in circles.  Wants to know if PPM showed anything.  Worries that PPM is not functioning correctly.    Denies extremity weakness, had a little chest discomfort under breast bone.  Denies difficulty with speech.  Episode lasting about an hour.  Last BP check 118/81-62.  Denies any further occurs similar to this one.  Pt expresses thinks symptoms could be coming from Eliquis.  Pt has taken Eliquis previously without difficulty..  Will send concern to device clinic to review.

## 2023-09-18 NOTE — Telephone Encounter (Signed)
Pt c/o medication issue:  1. Name of Medication: apixaban (ELIQUIS) 5 MG TABS tablet   2. How are you currently taking this medication (dosage and times per day)? 2 - 2.5 tablets by mouth twice daily  3. Are you having a reaction (difficulty breathing--STAT)? No  4. What is your medication issue? Pt states that since Saturday she has been feeling dizzy as well as "swimmy headed". Pt would like a c/b regarding this matter. Please advise

## 2023-09-18 NOTE — Telephone Encounter (Signed)
Contacted patient to walk pt through a manual remote transmission. Advised patient once reviewed will contact patient. Pt has ER precaution if symptoms progress.

## 2023-09-19 ENCOUNTER — Ambulatory Visit: Payer: Medicare PPO | Attending: Internal Medicine

## 2023-09-19 DIAGNOSIS — I442 Atrioventricular block, complete: Secondary | ICD-10-CM

## 2023-09-19 LAB — CUP PACEART INCLINIC DEVICE CHECK
Battery Remaining Longevity: 82 mo
Battery Voltage: 2.99 V
Brady Statistic RA Percent Paced: 87 %
Brady Statistic RV Percent Paced: 99.97 %
Date Time Interrogation Session: 20241112105908
Implantable Lead Connection Status: 753985
Implantable Lead Connection Status: 753985
Implantable Lead Implant Date: 20140805
Implantable Lead Implant Date: 20140805
Implantable Lead Location: 753859
Implantable Lead Location: 753860
Implantable Lead Model: 1944
Implantable Lead Model: 1948
Implantable Pulse Generator Implant Date: 20240119
Lead Channel Impedance Value: 575 Ohm
Lead Channel Impedance Value: 587.5 Ohm
Lead Channel Pacing Threshold Amplitude: 0.75 V
Lead Channel Pacing Threshold Amplitude: 0.75 V
Lead Channel Pacing Threshold Amplitude: 1.25 V
Lead Channel Pacing Threshold Amplitude: 1.25 V
Lead Channel Pacing Threshold Pulse Width: 0.5 ms
Lead Channel Pacing Threshold Pulse Width: 0.5 ms
Lead Channel Pacing Threshold Pulse Width: 0.8 ms
Lead Channel Pacing Threshold Pulse Width: 0.8 ms
Lead Channel Sensing Intrinsic Amplitude: 12 mV
Lead Channel Sensing Intrinsic Amplitude: 3.3 mV
Lead Channel Setting Pacing Amplitude: 2.5 V
Lead Channel Setting Pacing Amplitude: 2.5 V
Lead Channel Setting Pacing Pulse Width: 0.5 ms
Lead Channel Setting Sensing Sensitivity: 4 mV
Pulse Gen Model: 2272
Pulse Gen Serial Number: 8145790

## 2023-09-19 NOTE — Telephone Encounter (Signed)
Appointment made to enable data collection and assess leads/parameters. Pt lightheaded and dizzy but not currently. Instructed to check HR w/ pulse oximeter to ensure HR is not below 60. Scheduled for 1030 AM today.

## 2023-09-19 NOTE — Progress Notes (Signed)
Pacemaker check in clinic. Normal device function. Thresholds, sensing, impedance's consistent with previous measurements. Device programmed to maximize longevity. No mode switch or high ventricular rates noted. Device programmed at appropriate safety margins. Histogram distribution appropriate for patient activity level. Device programmed to optimize intrinsic conduction. Estimated longevity 6.9-7.6 years.  Patient enrolled in remote follow-up. Patient education completed.   Pt c/o dizziness, swimmyheaded, SHoB w/ exertion that resolves after some time.   changes made per protocol.   Increased slope from 8-10 d/t left sided histograms added V amp channel for remote monitoring purposes increased RV amp. to 2.5--4 as per clinic standard

## 2023-10-02 ENCOUNTER — Telehealth (HOSPITAL_COMMUNITY): Payer: Self-pay | Admitting: *Deleted

## 2023-10-02 NOTE — Telephone Encounter (Signed)
Reaching out to patient to offer assistance regarding upcoming cardiac imaging study; pt verbalizes understanding of appt date/time, parking situation and where to check in, medications ordered, and verified current allergies; name and call back number provided for further questions should they arise  Larey Brick RN Navigator Cardiac Imaging [facility] [phone] office [phone] cell  Patient to take 1 mg ativan for her cardiac MRI. She will have a driver and states her only metal is her pacemaker.

## 2023-10-03 ENCOUNTER — Ambulatory Visit (HOSPITAL_COMMUNITY)
Admission: RE | Admit: 2023-10-03 | Discharge: 2023-10-03 | Disposition: A | Discharge status: Home / Self Care | Payer: Medicare PPO | Source: Ambulatory Visit | Attending: Internal Medicine | Admitting: Internal Medicine

## 2023-10-03 ENCOUNTER — Other Ambulatory Visit: Payer: Self-pay | Admitting: Internal Medicine

## 2023-10-03 DIAGNOSIS — I421 Obstructive hypertrophic cardiomyopathy: Secondary | ICD-10-CM

## 2023-10-03 MED ORDER — GADOBUTROL 1 MMOL/ML IV SOLN
10.0000 mL | Freq: Once | INTRAVENOUS | Status: AC | PRN
  Administered 2023-10-03: 10 mL via INTRAVENOUS

## 2023-10-04 ENCOUNTER — Encounter: Payer: Self-pay | Admitting: Cardiology

## 2023-10-04 DIAGNOSIS — I071 Rheumatic tricuspid insufficiency: Secondary | ICD-10-CM | POA: Insufficient documentation

## 2023-10-04 DIAGNOSIS — I77810 Thoracic aortic ectasia: Secondary | ICD-10-CM | POA: Insufficient documentation

## 2023-10-04 DIAGNOSIS — I340 Nonrheumatic mitral (valve) insufficiency: Secondary | ICD-10-CM | POA: Insufficient documentation

## 2023-10-10 ENCOUNTER — Telehealth: Payer: Self-pay

## 2023-10-10 DIAGNOSIS — I421 Obstructive hypertrophic cardiomyopathy: Secondary | ICD-10-CM

## 2023-10-10 NOTE — Telephone Encounter (Signed)
Call to patient to discuss cardiac MRI results. No answer, no recent filled out DPR on file. Left message with no identifiers asking recipient to call [place] at our office #.

## 2023-10-10 NOTE — Telephone Encounter (Signed)
-----   Message from Armanda Magic sent at 10/04/2023 10:24 PM EST ----- Please let patient know that cMRI showed findings consistent with hypertrophic cardiomyopathy.  LVF is normal.  RV function mildly reduced.  The tricuspid valve is moderately leaky and the mitral valve is mild to moderately leaky.  The ascending aorta is mildly enlarged at 43mm.   Please see if you can move up her appt with Dr. Izora Ribas

## 2023-10-11 NOTE — Telephone Encounter (Signed)
Called pt reviewed MD recommendation: When I met her she had some fairly vague symptoms.  I had discussed with her that: - if we can confirm CMR we will attempt stress echo for symptoms; she gave her consent  I would recommend getting this test in December, that way I can discuss results with her in January  Thanks, MAC   Pt is agreeable to plan, instructions reviewed and placed on my chart for review.  Advised a scheduler will call to schedule appointment.

## 2023-10-11 NOTE — Addendum Note (Signed)
Addended by: Macie Burows on: 10/11/2023 05:03 PM   Modules accepted: Orders

## 2023-10-11 NOTE — Telephone Encounter (Signed)
Pt returning call

## 2023-10-12 NOTE — Addendum Note (Signed)
Addended by: Riley Lam A on: 10/12/2023 07:52 AM   Modules accepted: Orders

## 2023-10-13 NOTE — Telephone Encounter (Signed)
Left a message for pt to call back.  Would like to change date of OV with Dr. Izora Ribas.  Stress Echo is scheduled for 11/22/22 which is after 11/21/22 currently scheduled visit.

## 2023-10-18 ENCOUNTER — Other Ambulatory Visit: Payer: Self-pay | Admitting: Internal Medicine

## 2023-10-18 DIAGNOSIS — Z1231 Encounter for screening mammogram for malignant neoplasm of breast: Secondary | ICD-10-CM

## 2023-10-30 ENCOUNTER — Emergency Department (HOSPITAL_BASED_OUTPATIENT_CLINIC_OR_DEPARTMENT_OTHER)
Admission: EM | Admit: 2023-10-30 | Discharge: 2023-10-31 | Disposition: A | Discharge status: Home / Self Care | Payer: Medicare PPO | Attending: Emergency Medicine | Admitting: Emergency Medicine

## 2023-10-30 ENCOUNTER — Encounter (HOSPITAL_BASED_OUTPATIENT_CLINIC_OR_DEPARTMENT_OTHER): Payer: Self-pay

## 2023-10-30 ENCOUNTER — Other Ambulatory Visit: Payer: Self-pay

## 2023-10-30 ENCOUNTER — Emergency Department (HOSPITAL_BASED_OUTPATIENT_CLINIC_OR_DEPARTMENT_OTHER): Payer: Medicare PPO

## 2023-10-30 DIAGNOSIS — I10 Essential (primary) hypertension: Secondary | ICD-10-CM | POA: Diagnosis not present

## 2023-10-30 DIAGNOSIS — R002 Palpitations: Secondary | ICD-10-CM | POA: Insufficient documentation

## 2023-10-30 DIAGNOSIS — Z87891 Personal history of nicotine dependence: Secondary | ICD-10-CM | POA: Insufficient documentation

## 2023-10-30 DIAGNOSIS — Z7901 Long term (current) use of anticoagulants: Secondary | ICD-10-CM | POA: Insufficient documentation

## 2023-10-30 DIAGNOSIS — Z950 Presence of cardiac pacemaker: Secondary | ICD-10-CM | POA: Insufficient documentation

## 2023-10-30 DIAGNOSIS — Z79899 Other long term (current) drug therapy: Secondary | ICD-10-CM | POA: Insufficient documentation

## 2023-10-30 DIAGNOSIS — R0789 Other chest pain: Secondary | ICD-10-CM | POA: Diagnosis not present

## 2023-10-30 DIAGNOSIS — R030 Elevated blood-pressure reading, without diagnosis of hypertension: Secondary | ICD-10-CM

## 2023-10-30 LAB — BASIC METABOLIC PANEL
Anion gap: 9 (ref 5–15)
BUN: 17 mg/dL (ref 8–23)
CO2: 24 mmol/L (ref 22–32)
Calcium: 9.2 mg/dL (ref 8.9–10.3)
Chloride: 107 mmol/L (ref 98–111)
Creatinine, Ser: 0.9 mg/dL (ref 0.44–1.00)
GFR, Estimated: >60 mL/min (ref >60)
Glucose, Bld: 111 mg/dL — ABNORMAL HIGH (ref 70–99)
Potassium: 4 mmol/L (ref 3.5–5.1)
Sodium: 140 mmol/L (ref 135–145)

## 2023-10-30 LAB — CBC
HCT: 41.1 % (ref 36.0–46.0)
Hemoglobin: 13.2 g/dL (ref 12.0–15.0)
MCH: 28.6 pg (ref 26.0–34.0)
MCHC: 32.1 g/dL (ref 30.0–36.0)
MCV: 89 fL (ref 80.0–100.0)
Platelets: 205 10*3/uL (ref 150–400)
RBC: 4.62 MIL/uL (ref 3.87–5.11)
RDW: 13.9 % (ref 11.5–15.5)
WBC: 5.4 10*3/uL (ref 4.0–10.5)
nRBC: 0 % (ref 0.0–0.2)

## 2023-10-30 LAB — TROPONIN I (HIGH SENSITIVITY): Troponin I (High Sensitivity): 11 ng/L (ref <18)

## 2023-10-30 NOTE — ED Triage Notes (Addendum)
Pt reports she is here today due to chest discomfort and hypertension. Pt denies any pain in her chest but states she feels like her heart is racing. Pt reports she has a pacemaker that was placed x10 years ago ( new one placed in January).Pt reports she had a complete heart block at the time of insertion. Pt denies any N&V or sob. Pt also reports she started having a headache and took her bp and notice it was elevated 180's. Pt reports taking her bp medication as prescribed and denies missing any doses.

## 2023-10-31 ENCOUNTER — Telehealth: Payer: Self-pay | Admitting: Physician Assistant

## 2023-10-31 LAB — TROPONIN I (HIGH SENSITIVITY): Troponin I (High Sensitivity): 13 ng/L (ref <18)

## 2023-10-31 NOTE — Telephone Encounter (Signed)
   The patient called the answering service after-hours today (holiday) due to concerns for high BP. Chart reviewed. Last OV's 07/2023 showed normotensive values. She was previously on Toprol 200mg  daily then reduced to 100mg  daily due to fatigue.  She was seen in ED yesterday with palpitations with initial SBP 180s which EDP felt was due to anxiety vs age-related dysautonomia as all subsequent BPs trended back down to 120s-140s/80s-90s without intervention, discharging BP 124/86. Per their notes, pacemaker interrogation without recent events, and labs felt reassuring. Patient denies any extra caffeine, pain, or OTC supplements.  The patient rechecked her BP this afternoon and got 159/111. No acute symptoms with this. She typically takes her Toprol 100mg  in the evening. She would like to add back a half tablet (50mg ) daily to see how it does wither BP. She will take her 100mg  dose now (a little early) then 50mg  at bedtime if BP still elevated. She will repeat the same tomorrow. Advised she give update to office on Thursday when office re-opens so that this information can be related to Dr. Izora Ribas for further review of trends and next steps for meds. The patient verbalized understanding and gratitude.   Laurann Montana, PA-C

## 2023-10-31 NOTE — ED Notes (Signed)
Called [employer] to have pacemaker interrogated . Waiting on call back from facility.

## 2023-10-31 NOTE — ED Provider Notes (Signed)
[facility] Provider Note  CSN: [id number] Arrival date & time: 10/30/23 2248  Chief Complaint(s) Palpitations and Hypertension  HPI Alexandra Wheeler is a 82 y.o. female with a past medical history listed below including hypertension, hyperlipidemia, complete heart block status post pacemaker that was replaced in January of this year here for palpitations and hypertension.  Patient reports feeling more severe palpitations 2 days ago that was brief.  Earlier this evening, she felt the palpitations again and checked her blood pressure.  She noted that the blood pressure was reading in the 190s.  That prompted her visit to the emergency department.  She denied any overt chest pain.  Denied any associated shortness of breath.  No nausea or vomiting.  No abdominal pain.  No recent fevers or infections.  No coughing or congestion.  She reports being compliant with all of her medications.  Denies any recent alcohol or drug use.  Denies any OTC congestion medicine.  The history is provided by the patient.    Past Medical History Past Medical History:  Diagnosis Date   Anxiety    Ascending aorta dilatation (HCC)    43mm by cMRI 09/2023   Atrophic vaginitis    Chest pain    a. Adenosine sestamibi (1/14) with no evidence of ischemia or infarction, EF 63%.    Colon polyps    Complete heart block (HCC) 06/2013   status post pacemaker implantation by Dr Johney Frame 06-11-2013   Depression    DI (detrusor instability)    GERD (gastroesophageal reflux disease)    History + Hyplori via EGD   H/O echocardiogram    a. Echo 11/2012:EF 50-55%, focal basal septal hypertrophy with some mitral valve SAM (mild) and MR.  This may be a sigmoid septum with advanced age or could be a variant of hypertrophic cardiomyopathy.   HOCM (hypertrophic obstructive cardiomyopathy) (HCC)    Echocardiogram 2/22: EF 60-65, no RWMA, severe asymmetric septal LVH, no LVOT, Gr 1 DD,  mild SAM, mod MR, mild LAE, mod to severe TR, Asc Ao 42 mm, normal RVSF, RVSP 28   HOCM (hypertrophic obstructive cardiomyopathy) (HCC)    noted on cMRI 09/2023   HTN (hypertension)    Hyperlipidemia    LBBB (left bundle branch block)    Leukopenia 05/19/2014   Lymphocytosis 05/19/2014   Migraines    Mitral regurgitation    mild to moderate by cMRI 09/2023   Myoview    Myoview 12/21: EF 69, no ischemia or infarction; low risk    Osteoporosis    Paroxysmal atrial fibrillation (HCC)    Prediabetes    Thoracic aortic aneurysm (HCC)    Chest CTA 2/22: Ascending thoracic aortic aneurysm 44 mm; Ao arch 34 mm>>repeat 1 year   Tricuspid regurgitation    moderate by cMRI 09/2023   Vitamin D deficiency    Patient Active Problem List   Diagnosis Date Noted   Ascending aorta dilatation (HCC)    Tricuspid regurgitation    Mitral regurgitation    Tenderness of both temporomandibular joints 11/30/2021   Former smoker 01/07/2021   FHx: heart disease 01/07/2021   Thoracic aortic aneurysm (HCC) by Chest CTA 12/2020    Hypertrophic cardiomyopathy (HCC)    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   Episodic tension-type headache, not intractable 02/12/2020   Familial tremor 06/10/2019   GERD (gastroesophageal reflux disease) 02/25/2019   Major depressive disorder, recurrent episode, mild with anxious distress (HCC) 02/25/2019   Pacemaker  02/25/2019   Paroxysmal atrial fibrillation (HCC) 02/25/2019   Chronic anticoagulation 02/25/2019   HOCM (hypertrophic obstructive cardiomyopathy) (HCC) 09/21/2015   Overweight (BMI 25.0-29.9) 09/01/2015   Vitamin D deficiency    Abnormal glucose    Essential hypertension    Hyperlipidemia, mixed    CHB (complete heart block) (HCC) 06/10/2013   LBBB (left bundle branch block) 11/15/2012   Osteoporosis    DI (detrusor instability)    Atrophic vaginitis    Home Medication(s) Prior to Admission medications   Medication Sig Start Date End Date  Taking? Authorizing Provider  acetaminophen (TYLENOL) 500 MG tablet Take 500 mg by mouth as needed.    [provider]  Alcohol Swabs (B-D SINGLE USE SWABS REGULAR) PADS Use as directed for Glucose monitoring Daily 04/26/21   Lucky Cowboy, MD  apixaban (ELIQUIS) 5 MG TABS tablet Take 1 tablet (5 mg total) by mouth 2 (two) times daily. 06/22/23   Camnitz, Andree Coss, MD  Ascorbic Acid (VITAMIN C PO) Take 1 tablet by mouth daily.     [provider]  CALCIUM PO Take 1 tablet by mouth daily.    [provider]  Cholecalciferol (VITAMIN D PO) Take 2 capsules by mouth daily.    [provider]  FOLIC ACID PO Take 1 tablet by mouth daily.     [provider]  hydrocortisone 1 % ointment APPLY TO HEMORRHOID 3 TO 4 TIMES A DAY 03/06/23   Raynelle Dick, NP  LORazepam (ATIVAN) 1 MG tablet Take 30 min to 1 hour prior to Cardiac MRI 07/27/23   Riley Lam A, MD  MAGNESIUM PO Take 1 tablet by mouth daily.    [provider]  metoprolol succinate (TOPROL-XL) 100 MG 24 hr tablet Take 1 tablet (100 mg total) by mouth daily. Take with or immediately following a meal. 07/27/23   Chandrasekhar, Mahesh A, MD  Multiple Vitamin (MULTIVITAMIN) tablet Take 1 tablet by mouth daily.    [provider]  Multiple Vitamins-Minerals (ZINC PO) Take by mouth. daily    [provider]  pantoprazole (PROTONIX) 40 MG tablet Take 1 tablet Daily with Supper for Indigestion & Acid Reflux Patient taking differently: Take 40 mg by mouth daily as needed (acid reflux). 06/20/19   Lucky Cowboy, MD  tretinoin (RETIN-A) 0.025 % cream Please specify directions, refills and quantity Patient taking differently: Apply 1 Application topically daily as needed (wrinkles). Please specify directions, refills and quantity 10/21/22   Raynelle Dick, NP                                                                                                                                     Allergies Levsin [hyoscyamine sulfate], Mobic [meloxicam], Topamax [topiramate], Zoloft [sertraline hcl], Actonel [risedronate sodium], Boniva [ibandronic acid], Evista [raloxifene], and Fosamax [alendronate sodium]  Review of Systems Review of Systems As noted in HPI  Physical Exam Vital Signs  I have reviewed the triage vital signs BP 124/86   Pulse (!) 58   Temp 98 F (36.7 C)   Resp 19   Ht 5\' 5"  (1.651 m)   Wt 77.1 kg   SpO2 98%   BMI 28.29 kg/m   Physical Exam Vitals reviewed.  Constitutional:      General: She is not in acute distress.    Appearance: She is well-developed. She is not diaphoretic.  HENT:     Head: Normocephalic and atraumatic.     Nose: Nose normal.  Eyes:     General: No scleral icterus.       Right eye: No discharge.        Left eye: No discharge.     Conjunctiva/sclera: Conjunctivae normal.     Pupils: Pupils are equal, round, and reactive to light.  Cardiovascular:     Rate and Rhythm: Normal rate and regular rhythm.     Heart sounds: No murmur heard.    No friction rub. No gallop.  Pulmonary:     Effort: Pulmonary effort is normal. No respiratory distress.     Breath sounds: Normal breath sounds. No stridor. No rales.  Abdominal:     General: There is no distension.     Palpations: Abdomen is soft.     Tenderness: There is no abdominal tenderness.  Musculoskeletal:        General: No tenderness.     Cervical back: Normal range of motion and neck supple.  Skin:    General: Skin is warm and dry.     Findings: No erythema or rash.  Neurological:     Mental Status: She is alert and oriented to person, place, and time.     ED Results and Treatments Labs (all labs ordered are listed, but only abnormal results are displayed) Labs Reviewed  BASIC METABOLIC PANEL - Abnormal; Notable for the following components:      Result Value   Glucose, Bld 111 (*)    All other components within normal limits  CBC  TROPONIN I  (HIGH SENSITIVITY)  TROPONIN I (HIGH SENSITIVITY)                                                                                                                         EKG  EKG Interpretation Date/Time:  Monday October 30 2023 22:57:10 EST Ventricular Rate:  60 PR Interval:  174 QRS Duration:  180 QT Interval:  488 QTC Calculation: 488 R Axis:   -79  Text Interpretation: AV dual-paced rhythm Abnormal ECG When compared with ECG of 15-Oct-2018 13:53, PREVIOUS ECG IS PRESENT Confirmed by Drema Pry [phone]) on 10/30/2023 11:29:57 PM       Radiology DG Chest 2 View Result Date: 10/31/2023 CLINICAL DATA:  Chest discomfort. EXAM: CHEST - 2 VIEW COMPARISON:  June 12, 2013 FINDINGS: There is stable dual lead AICD positioning. The heart size is within normal limits. Stable rounded opacities are seen projecting over the medial  aspect of the bilateral apices, consistent with known subpleural neurofibromas. Both lungs are otherwise clear. The visualized skeletal structures are unremarkable. IMPRESSION: 1. No active cardiopulmonary disease. Electronically Signed   By: Aram Candela M.D.   On: 10/31/2023 01:06    Medications Ordered in ED Medications - No data to display Procedures Procedures  (including critical care time) Medical Decision Making / ED Course   Medical Decision Making Amount and/or Complexity of Data Reviewed Labs: ordered. Decision-making details documented in ED Course. Radiology: ordered and independent interpretation performed. Decision-making details documented in ED Course. ECG/medicine tests: ordered and independent interpretation performed. Decision-making details documented in ED Course.  Risk Decision regarding hospitalization.   Differential diagnosis and workup listed below   Palpitations Will need to assess for tacky dysrhythmias.  Will assess for electrolyte derangements.  Will interrogate pacemaker CBC without leukocytosis or anemia BMP  without significant electrolyte derangements or renal sufficiency Pacemaker interrogation w/o recent events On telemetry, patient had regular paced rate with rates in the 60s.  No dysrhythmias She denied any overt chest pain concerning for ACS but given her history, will rule this out. EKG with paced rhythm.  No acute ischemic changes. Serial troponins negative x 2 Doubt dissection, PE. Chest x-ray without evidence of pneumonia, pneumothorax, pulmonary edema pleural effusions.  Elevated blood pressure Possibly secondary to anxiety vs age-related dysautonomia Initial blood pressure in the 180s.  Subsequent blood pressures down trended to the 130s without intervention.      Final Clinical Impression(s) / ED Diagnoses Final diagnoses:  Palpitations  Elevated blood pressure reading   The patient appears reasonably screened and/or stabilized for discharge and I doubt any other medical condition or other [facility] requiring further screening, evaluation, or treatment in the ED at this time. I have discussed the findings, Dx and Tx plan with the patient/family who expressed understanding and agree(s) with the plan. Discharge instructions discussed at length. The patient/family was given strict return precautions who verbalized understanding of the instructions. No further questions at time of discharge.  Disposition: Discharge  Condition: Good  ED Discharge Orders     None       Follow Up: Lucky Cowboy, MD [address] [place] [address] [phone]  Call  to schedule an appointment for close follow up  Regan Lemming, MD [address] [place] [place] [address] [phone]  Call      This chart was dictated using voice recognition software.  Despite best efforts to proofread,  errors can occur which can change the documentation meaning.    Nira Conn, MD 10/31/23 [phone]

## 2023-11-02 ENCOUNTER — Telehealth: Payer: Self-pay

## 2023-11-02 ENCOUNTER — Telehealth: Payer: Self-pay | Admitting: Internal Medicine

## 2023-11-02 DIAGNOSIS — Z79899 Other long term (current) drug therapy: Secondary | ICD-10-CM

## 2023-11-02 MED ORDER — HYDROCHLOROTHIAZIDE 25 MG PO TABS: 25.0000 mg | ORAL_TABLET | Freq: Every day | ORAL | 3 refills | Status: DC

## 2023-11-02 NOTE — Telephone Encounter (Signed)
Pt c/o BP issue: STAT if pt c/o blurred vision, one-sided weakness or slurred speech  1. What are your last 5 BP readings? 141/97  2. Are you having any other symptoms (ex. Dizziness, headache, blurred vision, passed out)? Headaches  3. What is your BP issue? Pt is concerned about her BP still being elevated and would like to be advised on what to do. Please advise

## 2023-11-02 NOTE — Telephone Encounter (Signed)
Call to patient to advise of cardiac MRI results. Patient verbalizes understanding of evidence of hypertrophic cardiomyopathy, mildly reduced RV function and changes to valves. Patient w/ appt to see Dr. Izora Ribas 11/24/23, has stress echo scheduled 11/22/22.

## 2023-11-02 NOTE — Telephone Encounter (Signed)
-----   Message from Armanda Magic sent at 10/04/2023 10:24 PM EST ----- Please let patient know that cMRI showed findings consistent with hypertrophic cardiomyopathy.  LVF is normal.  RV function mildly reduced.  The tricuspid valve is moderately leaky and the mitral valve is mild to moderately leaky.  The ascending aorta is mildly enlarged at 43mm.   Please see if you can move up her appt with Dr. Izora Ribas

## 2023-11-02 NOTE — Telephone Encounter (Signed)
Returned call to patient and reviewed below recommendations. She agrees to plan. Medication sent to pharmacy on file and labs entered and released for her to get.   November 02, 2023 Christell Constant, MD to Macie Burows, RN  [facility]   11/02/23  8:08 AM If patient calls back (due to elevated BP, we will start hydrochlorothiazide 25 mg PO daily as I had discussed with her in September and get BMP in two weeks

## 2023-11-03 ENCOUNTER — Telehealth: Payer: Self-pay | Admitting: Internal Medicine

## 2023-11-03 DIAGNOSIS — I10 Essential (primary) hypertension: Secondary | ICD-10-CM

## 2023-11-03 DIAGNOSIS — Z79899 Other long term (current) drug therapy: Secondary | ICD-10-CM

## 2023-11-03 NOTE — Telephone Encounter (Signed)
Called pt to f/u BP readings.  Pt reports started hydrochlorothiazide today around 11 am as instructed by Triage RN. Takes Toprol XL around 7:30 pm daily.  Pt feels may have had more sodium in diet recently d/t holidays.  Has a hx of anxiety but denies anything that would trigger anxiety.   Advised pt to check BP once daily around the same time keep a log and send in readings in about 2 weeks for MD to review.  Advised to notify our office if BP is consistently elevated above 140's with HA or SOB.  Also advised to notify our office if BP is lower than 110 and feels tired or dizzy.  Pt expresses understanding.  Pt reports was told would need f/u labs in 2 weeks.  She will go to [facility]. BMP order released for future draw.

## 2023-11-03 NOTE — Telephone Encounter (Signed)
New Message:     French Ana from [facility] Management wants to give the nuse update on patient's condition/

## 2023-11-06 ENCOUNTER — Telehealth: Payer: Self-pay | Admitting: Internal Medicine

## 2023-11-06 NOTE — Telephone Encounter (Signed)
Spoke with patient and she states she can not take hydrochlorothiazide. She states it makes her light headed and dizzy but she does not feel it helps her BP. She states she took it in 2012 and had to stop back then. She would like to know if you recommend another medication she would also like to try a natural products. She stated she would like to talk to the pharmacist to help control her BP

## 2023-11-06 NOTE — Telephone Encounter (Signed)
  Pt c/o medication issue:  1. Name of Medication:   hydrochlorothiazide (HYDRODIURIL) 25 MG tablet    2. How are you currently taking this medication (dosage and times per day)?   Take 1 tablet (25 mg total) by mouth daily.    3. Are you having a reaction (difficulty breathing--STAT)? No   4. What is your medication issue? Pt saide she really can't tolerate this medication. She said, she gets too lightheaded and need to hold onto something when she stand up. She wants to ask Dr. Izora Ribas if she can try this medication glyco optimizer to lower her BP

## 2023-11-07 ENCOUNTER — Ambulatory Visit: Payer: Medicare PPO | Admitting: Nurse Practitioner

## 2023-11-07 NOTE — Telephone Encounter (Signed)
 She should cut the hydrochlorothiazide  in half and take it with food, early in the day.  It might cause a little dizziness at first, but it usually goes away within a couple of days.  And she should NOT take the Glyco Optimizer.   It's not designed to lower blood pressure and none of the ingredients will do that.  It's supposedly to help with weight management, but has green tea extract which has caffeine .   This can make her BP go up.

## 2023-11-09 NOTE — Telephone Encounter (Signed)
 Attempted to return call to patient, but no answer and states "voicemail box not set up." Will re-attempt

## 2023-11-09 NOTE — Telephone Encounter (Signed)
 Left a message to call back.

## 2023-11-09 NOTE — Telephone Encounter (Signed)
 Attempted to call pt back, voicemail not set up unable to leave a message.

## 2023-11-09 NOTE — Telephone Encounter (Signed)
 Pt is returning to nurse

## 2023-11-10 ENCOUNTER — Encounter: Payer: Self-pay | Admitting: Internal Medicine

## 2023-11-10 NOTE — Telephone Encounter (Signed)
 Called pt advised of [facility] response:  She should cut the hydrochlorothiazide  in half and take it with food, early in the day.  It might cause a little dizziness at first, but it usually goes away within a couple of days.  And she should NOT take the Glyco Optimizer.   It's not designed to lower blood pressure and none of the ingredients will do that.  It's supposedly to help with weight management, but has green tea extract which has caffeine .   This can make her BP go up.     Pt reports would rather take increased dose of metoprolol  vs taking hydrochlorothiazide  previously took med and dizziness was worrisome. If pt can't have increased metoprolol  would like another med to help control BP.   Also reports does not take Glyco Optimizer.   BP 1/2- 180/100's, 1/3-  AM 140/100's recheck 125/80  HR in 60's.  Advised will send to pharmacist to advise.

## 2023-11-10 NOTE — Telephone Encounter (Signed)
 Called pt advised of pharmacist response:  She was on metoprolol  150 mg daily, but was cut back to 100 mg secondary to fatigue.  If she wants to go back to 150 mg, she will need to monitor heart rate, as she looks to average around 60.   Also, metoprolol  is not a strong BP medication, so although it will help, it is not usually enough.  She can start back on it and follow up with MD at her appointment on Jan 17   Pt reports she will not take hydrochlorothiazide  and increase metoprolol  succinate to 150 mg PO every day.  I advised I will send message to MD to advise.

## 2023-11-10 NOTE — Telephone Encounter (Signed)
 She was on metoprolol  150 mg daily, but was cut back to 100 mg secondary to fatigue.  If she wants to go back to 150 mg, she will need to monitor heart rate, as she looks to average around 60.   Also, metoprolol  is not a strong BP medication, so although it will help, it is not usually enough.  She can start back on it and follow up with MD at her appointment on Jan 17

## 2023-11-10 NOTE — Telephone Encounter (Signed)
 Please call me. I have a new phone and for some reason your number was blocked yesterday. You can also call my daughter Luke if you don't reach me. Her number is [phone].   Called patient who states that her blood pressure is elevated. States yesterday evening her blood pressure range was 180-190/100-116. She normally takes metoprolol  100 mg but took 150 mg because she was worried and had a headache. She states that she did not start the hydrochlorothiazide  25mg  that was prescribed last month. She did take one dose last pm. She states that she can have dizziness and headache depending on blood pressure and that is why she has had adjustments in her metoprolol . States today her bp early this am was 150/11 and now with recheck is 125/80.  She states she is feeling better right now but needs to know how to proceed with the meds. She has a follow up appointment already scheduled with Dr Suzette on 11/24/23. I had looked for an earlier appointment with any APP and the next one is on 11/23/23. Informed patient I would forward this note to the provider nurse.

## 2023-11-10 NOTE — Telephone Encounter (Signed)
 Pt returning call

## 2023-11-13 ENCOUNTER — Telehealth: Payer: Self-pay | Admitting: Internal Medicine

## 2023-11-13 MED ORDER — AMLODIPINE BESYLATE 5 MG PO TABS: 5.0000 mg | ORAL_TABLET | Freq: Every day | ORAL | 11 refills | Status: DC

## 2023-11-13 NOTE — Telephone Encounter (Signed)
 Called pt advised of MD response: Amlodipine  5 mg.  Would meet her requirements.  - I am concerned that a some of her symptoms are non cardiac.  I look forward to seeing her later this month but advice her to also touch base with her PCP.   Pt has an OV with PCP on Thursday to address ear concerns.  Advised pt to keep a log of BP readings since starting a new BP medication.  Reminded pt to only take metoprolol  100 mg PO every day and amlodipine  5 mg PO every day.  Removed hydrochlorothiazide  from medication list.  Pt expresses understanding all questions answered.

## 2023-11-13 NOTE — Telephone Encounter (Signed)
 Patent called with blood pressure concerns, a stocked-up ear, and dizziness... She wants to know if she should go to the ER, her heart doctor, or her primary care? Please call her back with your recommendations.

## 2023-11-13 NOTE — Telephone Encounter (Signed)
Patient is calling back. Please advise  

## 2023-11-13 NOTE — Telephone Encounter (Signed)
 Pt reports is having issues with Ear being stopped up.  Had a dizzy episode Sunday.  Pt reports felt as if she would pass out, head spinning.  BP on Sunday morning 139/99-63 and later 111/80.   Advised of MD response pt would like to take a medication that does not require f/u labs to monitor kidney function.  Advised pt will send a message to MD to advise.

## 2023-11-14 NOTE — Telephone Encounter (Signed)
Please see telephone encounter for more details.

## 2023-11-15 NOTE — Progress Notes (Signed)
 MEDICARE ANNUAL WELLNESS VISIT AND FOLLOW UP  Assessment:   Diagnoses and all orders for this visit:  Encounter for Medicare annual wellness exam Continue annually   LBBB (left bundle branch block) Followed by cardiology; s/p pacemaker  Thoracic aortic aneurysm(HCC) Control BP. Weight , cholesterol and blood sugars  Hypertrophic obstructive cardiomyopathy (HCC) Weights stable; followed by cardiology; manage BP  Essential hypertension Continue medications Monitor blood pressure at home; call if consistently over 130/80 Continue DASH diet.   Reminder to go to the ER if any CP, SOB, nausea, dizziness, severe HA, changes vision/speech, left arm numbness and tingling and jaw pain.  CHB (complete heart block) (HCC) S/p pacemaker; followed by cardiology  Major depressive disorder, recurrent episode, mild with anxious distress (HCC) Restart Celexa  20 mg every day and continue atarax  Instructed patient to contact office or on-call physician promptly should condition worsen or any new symptoms appear. IF THE PATIENT HAS ANY SUICIDAL OR HOMICIDAL IDEATIONS, CALL THE OFFICE, DISCUSS WITH A SUPPORT MEMBER, OR GO TO THE ER IMMEDIATELY. Patient was agreeable with this plan.  Lifestyle discussed: diet/exerise, sleep hygiene, stress management, hydration  Age-related osteoporosis without current pathological fracture - DEXA UTD, continue Vit D and Ca, emphasized weight bearing exercises Couldn't tolerate bisphosphonates  Atrophic vaginitis Monitor; topicals PRN  Paroxysmal atrial fibrillation (HCC) No concerns with excessive bleeding, no falls Pacemaker Followed by cardiology  Vitamin D  deficiency Below goal last visit; she did increase dose continue to recommend supplementation for goal of 60-100 Check vitamin D  level annually or as needed  Abnormal glucose She is working on weight loss Discussed disease and risks Discussed diet/exercise, weight management  A1C, insulin   level routinely   Mixed hyperlipidemia/Statin myalgia Stop Rosuvastatin  due to myalgias, start red yeast rice and omega 3's Continue low cholesterol diet and exercise.  - lipid panel.   Gastroesophageal reflux disease, esophagitis presence not specified Well managed by lifestyle at this time Discussed diet, avoiding triggers and other lifestyle changes  Pacemaker Followed by Cardiology   Medication management -     CBC with Differential/Platelet -     COMPLETE METABOLIC PANEL WITH GFR -     Lipid panel -     Magnesium   Impacted cerumen right ear Ear lavage performed and removed wax- TM no erythem or bulging       Over 40 minutes of exam, counseling, chart review and critical decision making was performed Future Appointments  Date Time Provider Department Center  11/23/2023  2:30 PM [facility] NM2/TREAD [id number] [facility]  11/23/2023  2:45 PM [facility] CH ECHO 3 [id number] [facility]  11/24/2023  7:00 AM [facility] DEVICE REMOTES [facility]  11/24/2023  2:20 PM Santo Kelly A, MD [facility]  11/30/2023 11:10 AM GI-BCG MM 2 GI-BCGMM GI-BREAST CE  02/16/2024 10:00 AM Tonita Fallow, MD [facility] None  02/23/2024  7:00 AM [facility] DEVICE REMOTES [facility]  05/24/2024  7:00 AM [facility] DEVICE REMOTES [facility]  08/23/2024  7:00 AM [facility] DEVICE REMOTES [facility]  11/18/2024 11:30 AM Jude Lonell BRAVO, NP [facility] None  11/22/2024  7:00 AM [facility] DEVICE REMOTES [facility]     Plan:   During the course of the visit the patient was educated and counseled about appropriate screening and preventive services including:   Pneumococcal vaccine  Prevnar 13 Influenza vaccine Td vaccine Screening electrocardiogram Bone densitometry screening Colorectal cancer screening Diabetes screening Glaucoma screening Nutrition counseling  Advanced directives:  requested   Subjective:  Alexandra Wheeler is  a 83 y.o. female who presents for Medicare Annual Wellness Visit and 3 month follow up.   In 2014, she had a PPM implanted for CHB. She has an echocardiogram that suggests hypertrophic cardiomyopathy with asymmetric septal hypertrophy and mitral valve SAM with mild MR. She had negative cath. Followed by Dr. Santo. and newly with a. Fib clinic. She is taking Eliquis  5 mg BID. She had pacemaker changed 11/25/22 by Dr. Inocencio She denies CP, palpitations, fatigue, exercise intolerance, edema. She has appt next month on 05/19/23. She does have pain where pacemaker was inserted.She has echo next week and is following with Dr. JAYSON on the 17th.   She was previously following with Dr. Cesario she has headaches related to cervical arthritis, Will occasionally have pain in right hip and knees.   she has a diagnosis of depression/anxiety and is currently off all medications. She deals with a lot of anxiety. She is on Atarax  and it is helping.   She does have issues with GERD intermittently and uses Pantoprazole  as neede, also dietary modifications.    BMI is Body mass index is 29.39 kg/m., she has been working on diet, cutting starches and sweets, exercise limited due to chronic pain. Exercising walking 15 minutes daily Wt Readings from Last 3 Encounters:  11/16/23 176 lb 9.6 oz (80.1 kg)  10/30/23 170 lb (77.1 kg)  08/01/23 177 lb 3.2 oz (80.4 kg)    Her blood pressure has been controlled at home, taking Metoprolol  100 mg daily and Amlodipine  5 mg QD,today their BP is BP: 120/76 . At home her BP has been running 130's/99. She is having some dizziness and fatigue for 3 weeks.  BP Readings from Last 3 Encounters:  11/16/23 120/76  10/31/23 124/86  08/01/23 110/72  She does not workout. She does have some dizziness and blurred vision since starting amlodipine   Denies chest pain and shortness of breath.    She is on cholesterol medication Rosuvastatin  10  mg but taking once a week due to bad myalgias. Pain is much better when not taking statin.  Her cholesterol is not at goal. She is limiting red meat, dairy and eggs.  The cholesterol last visit was:   Lab Results  Component Value Date   CHOL 191 08/01/2023   HDL 47 (L) 08/01/2023   LDLCALC 120 (H) 08/01/2023   TRIG 129 08/01/2023   CHOLHDL 4.1 08/01/2023    She has been working on diet and exercise for prediabetes, and denies increased appetite, nausea, paresthesia of the feet, polydipsia, polyuria and visual disturbances. Limits white bread, white rice, pasta, potatoes, sugar. Last A1C in the office was:  Lab Results  Component Value Date   HGBA1C 6.1 (H) 08/01/2023   Last GFR: Lab Results  Component Value Date   EGFR 85 08/01/2023    Patient is on Vitamin D  supplement and near goal of 60 at recent check; she has increased up to 1000 IU daily   Lab Results  Component Value Date   VD25OH 54 08/01/2023      Medication Review: Current Outpatient Medications on File Prior to Visit  Medication Sig Dispense Refill   acetaminophen  (TYLENOL ) 500 MG tablet Take 500 mg by mouth as needed.     Alcohol Swabs  (B-D SINGLE USE SWABS  REGULAR) PADS Use as directed for Glucose monitoring Daily 100 each 3   amLODipine  (NORVASC ) 5 MG tablet Take 1 tablet (5 mg total) by mouth daily. 30 tablet 11   apixaban  (ELIQUIS ) 5 MG  TABS tablet Take 1 tablet (5 mg total) by mouth 2 (two) times daily. 180 tablet 3   Ascorbic Acid  (VITAMIN C  PO) Take 1 tablet by mouth daily.      CALCIUM  PO Take 1 tablet by mouth daily.     Cholecalciferol  (VITAMIN D  PO) Take 2 capsules by mouth daily.     FOLIC ACID  PO Take 1 tablet by mouth daily.      hydrocortisone  1 % ointment APPLY TO HEMORRHOID 3 TO 4 TIMES A DAY 56 g 4   MAGNESIUM  PO Take 1 tablet by mouth daily.     metoprolol  succinate (TOPROL -XL) 100 MG 24 hr tablet Take 1 tablet (100 mg total) by mouth daily. Take with or immediately following a meal. 90 tablet 3    Multiple Vitamin (MULTIVITAMIN) tablet Take 1 tablet by mouth daily.     Multiple Vitamins-Minerals (ZINC PO) Take by mouth. daily     pantoprazole  (PROTONIX ) 40 MG tablet Take 1 tablet Daily with Supper for Indigestion & Acid Reflux (Patient taking differently: Take 40 mg by mouth daily as needed (acid reflux).) 90 tablet 3   tretinoin  (RETIN-A ) 0.025 % cream Please specify directions, refills and quantity (Patient taking differently: Apply 1 Application topically daily as needed (wrinkles). Please specify directions, refills and quantity) 45 g 1   LORazepam  (ATIVAN ) 1 MG tablet Take 30 min to 1 hour prior to Cardiac MRI (Patient not taking: Reported on 11/16/2023) 1 tablet 0   No current facility-administered medications on file prior to visit.    Allergies  Allergen Reactions   Levsin [Hyoscyamine Sulfate]     unknown   Mobic [Meloxicam]     unknown    Topamax [Topiramate]     unknown    Zoloft [Sertraline Hcl]     unknown    Actonel [Risedronate Sodium] Other (See Comments)    Pt didn't like the way it made her feel    Boniva [Ibandronic Acid] Other (See Comments)    Pt didn't like the way it made her feel   Evista [Raloxifene] Other (See Comments)    Pt didn't like the way it made her feel   Fosamax [Alendronate Sodium] Other (See Comments)    Pt didn't like the way it made her feel     Current Problems (verified) Patient Active Problem List   Diagnosis Date Noted   Ascending aorta dilatation (HCC)    Tricuspid regurgitation    Mitral regurgitation    Tenderness of both temporomandibular joints 11/30/2021   Former smoker 01/07/2021   FHx: heart disease 01/07/2021   Thoracic aortic aneurysm (HCC) by Chest CTA 12/2020    Hypertrophic cardiomyopathy (HCC)    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   Episodic tension-type headache, not intractable 02/12/2020   Familial tremor 06/10/2019   GERD (gastroesophageal reflux disease) 02/25/2019   Major depressive  disorder, recurrent episode, mild with anxious distress (HCC) 02/25/2019   Pacemaker 02/25/2019   Paroxysmal atrial fibrillation (HCC) 02/25/2019   Chronic anticoagulation 02/25/2019   HOCM (hypertrophic obstructive cardiomyopathy) (HCC) 09/21/2015   Overweight (BMI 25.0-29.9) 09/01/2015   Vitamin D  deficiency    Abnormal glucose    Essential hypertension    Hyperlipidemia, mixed    CHB (complete heart block) (HCC) 06/10/2013   LBBB (left bundle branch block) 11/15/2012   Osteoporosis    DI (detrusor instability)    Atrophic vaginitis     Screening Tests Immunization History  Administered Date(s) Administered   Fluad Quad(high Dose 65+)  07/25/2019   Influenza Split 08/06/2014, 09/20/2017   Influenza, High Dose Seasonal PF 09/01/2015, 08/19/2016, 08/29/2022, 08/01/2023   Influenza-Unspecified 09/12/2018   PNEUMOCOCCAL CONJUGATE-20 08/29/2022   Pneumococcal Conjugate-13 09/01/2015   Pneumococcal-Unspecified 10/08/2010   Td 05/09/2011   Zoster, Live 05/09/2011   Health Maintenance  Topic Date Due   COVID-19 Vaccine (1) 12/02/2023 (Originally 09/24/1946)   Zoster Vaccines- Shingrix (1 of 2) 02/14/2024 (Originally 09/25/1960)   Medicare Annual Wellness (AWV)  11/15/2024   Pneumonia Vaccine 44+ Years old  Completed   INFLUENZA VACCINE  Completed   DEXA SCAN  Completed   HPV VACCINES  Aged Out   DTaP/Tdap/Td  Discontinued   Mammo 12//20/23- scheduled for 11/30/23 Dexa 07/20/22 T score - 3.0 osteoporosis- continues Vit D and weight bearing exercises  Names of Other Physician/Practitioners you currently use: 1. [facility] here for primary care 2. Dr. Octavia, eye doctor, today 2:30 3. Dr. Candi, dentist, last visit 08/2023  Patient Care Team: Tonita Fallow, MD as PCP - General (Internal Medicine) Inocencio Soyla Lunger, MD as PCP - Electrophysiology (Cardiology) Santo Stanly LABOR, MD as PCP - Cardiology (Cardiology) Tonita Fallow, MD as Attending Physician (Internal Medicine) Kelsie Agent, MD (Inactive) as Consulting Physician (Cardiology) Lonn Hicks, MD as Consulting Physician (Hematology and Oncology) Donnald Charleston, MD as Consulting Physician (Gastroenterology) Lelon Glendia ONEIDA DEVONNA as Physician Assistant (Cardiology) Freida Valery SAILOR, [facility] (Inactive) as Pharmacist (Pharmacist)  SURGICAL HISTORY She  has a past surgical history that includes Breast surgery; Tubal ligation; Hemorrhoid surgery; Pacemaker insertion (06-11-2013); left heart catheterization with coronary angiogram (N/A, 06/11/2013); temporary pacemaker insertion (N/A, 06/11/2013); permanent pacemaker insertion (Left, 06/11/2013); permanent pacemaker insertion (N/A, 06/11/2013); Cataract extraction (Left, 2019); Breast excisional biopsy (Right); Breast biopsy (Left, 07/25/2019); and PPM GENERATOR CHANGEOUT (N/A, 11/25/2022). FAMILY HISTORY Her family history includes Aneurysm in her brother; Breast cancer (age of onset: 74) in her sister; Cancer in her father; Diabetes in her brother, brother, and mother; Heart disease in her brother, brother, and mother; Hypertension in her brother; Stroke in her brother. SOCIAL HISTORY She  reports that she quit smoking about 48 years ago. Her smoking use included cigarettes. She has never used smokeless tobacco. She reports that she does not drink alcohol and does not use drugs.   MEDICARE WELLNESS OBJECTIVES: Physical activity:  walking 20 minutes 3 days a week Cardiac risk factors: Cardiac Risk Factors include: advanced age (>64men, >39 women);dyslipidemia;hypertension;sedentary lifestyle Depression/mood screen:      11/16/2023   12:13 PM  Depression screen PHQ 2/9  Decreased Interest 2  Down, Depressed, Hopeless 0  PHQ - 2 Score 2  Altered sleeping 1  Tired, decreased energy 3  Change in appetite 0  Feeling bad or failure about yourself  0  Trouble concentrating 2  Moving slowly or fidgety/restless 0  Suicidal  thoughts 0  PHQ-9 Score 8  Difficult doing work/chores Very difficult    ADLs:     11/16/2023   12:19 PM 07/31/2023   11:34 PM  In your present state of health, do you have any difficulty performing the following activities:  Hearing? 1 0  Vision? 1 0  Difficulty concentrating or making decisions? 0 0  Walking or climbing stairs?  0  Dressing or bathing? 0 0  Doing errands, shopping? 0 0  Preparing Food and eating ? N   Using the Toilet? N   In the past six months, have you accidently leaked urine? N   Do you have  problems with loss of bowel control? N   Managing your Medications? N   Managing your Finances? N   Housekeeping or managing your Housekeeping? N      Cognitive Testing  Alert? Yes  Normal Appearance?Yes  Oriented to person? Yes  Place? Yes   Time? Yes  Recall of three objects?  Yes  Can perform simple calculations? Yes  Displays appropriate judgment?Yes  Can read the correct time from a watch face?Yes  EOL planning: Does Patient Have a Medical Advance Directive?: Yes Type of Advance Directive: Healthcare Power of Attorney, Living will Does patient want to make changes to medical advance directive?: No - Patient declined Copy of Healthcare Power of Attorney in Chart?: No - copy requested  Review of Systems  Constitutional:  Negative for malaise/fatigue and weight loss.  HENT:  Negative for hearing loss and tinnitus.   Eyes:  Positive for blurred vision. Negative for double vision.  Respiratory:  Negative for cough, sputum production, shortness of breath and wheezing.   Cardiovascular:  Negative for chest pain, palpitations, orthopnea, claudication, leg swelling and PND.  Gastrointestinal:  Negative for abdominal pain, blood in stool, diarrhea, heartburn, melena, nausea and vomiting. Constipation: controlled with miralax. Genitourinary: Negative.   Musculoskeletal:  Positive for joint pain (kness and hips), myalgias (since starting statin) and neck pain. Negative  for falls.  Skin:  Negative for rash.  Neurological:  Positive for dizziness. Negative for tingling, sensory change, weakness and headaches.  Endo/Heme/Allergies:  Negative for polydipsia.  Psychiatric/Behavioral:  Positive for depression. Negative for memory loss, substance abuse and suicidal ideas. The patient is nervous/anxious and has insomnia.   All other systems reviewed and are negative.    Objective:     Today's Vitals   11/16/23 1134  BP: 120/76  Pulse: 73  Temp: 97.7 F (36.5 C)  SpO2: 97%  Weight: 176 lb 9.6 oz (80.1 kg)  Height: 5' 5 (1.651 m)    Body mass index is 29.39 kg/m.    General appearance: alert, no distress, WD/WN,  female HEENT: normocephalic, sclerae anicteric, right TM obscured with wax- ear lavage removed was and TM WNL.  L TM no bulging or erythema. nares patent, no discharge or erythema, pharynx normal Neck:  supple, no lymphadenopathy, no thyromegaly, no masses Heart: RRR, normal S1, S2, no murmurs Lungs: CTA bilaterally, no wheezes, rhonchi, or rales Abdomen: +bs, soft, non distended Musculoskeletal: normal range of motion, normal sensation, reflexes, and pulses distal. Extremities: no edema, no cyanosis, no clubbing Pulses: 2+ symmetric, upper and lower extremities, normal cap refill Neurological: alert, oriented x 3, CN2-12 intact, strength normal upper extremities and lower extremities, sensation normal throughout, DTRs 2+ throughout, no cerebellar signs, slow steady gait  Psychiatric: normal affect, behavior normal, pleasant   Medicare Attestation I have personally reviewed: The patient's medical and social history Their use of alcohol, tobacco or illicit drugs Their current medications and supplements The patient's functional ability including ADLs,fall risks, home safety risks, cognitive, and hearing and visual impairment Diet and physical activities Evidence for depression or mood disorders  The patient's weight, height, BMI, and  visual acuity have been recorded in the chart.  I have made referrals, counseling, and provided education to the patient based on review of the above and I have provided the patient with a written personalized care plan for preventive services.     Donna Snooks E Onix Jumper, NP   11/16/2023

## 2023-11-16 ENCOUNTER — Other Ambulatory Visit: Payer: Self-pay | Admitting: Nurse Practitioner

## 2023-11-16 ENCOUNTER — Ambulatory Visit (INDEPENDENT_AMBULATORY_CARE_PROVIDER_SITE_OTHER): Payer: Medicare PPO | Admitting: Nurse Practitioner

## 2023-11-16 ENCOUNTER — Ambulatory Visit: Payer: Medicare PPO

## 2023-11-16 ENCOUNTER — Encounter: Payer: Self-pay | Admitting: Nurse Practitioner

## 2023-11-16 VITALS — BP 120/76 | HR 73 | Temp 97.7°F | Ht 65.0 in | Wt 176.6 lb

## 2023-11-16 DIAGNOSIS — Z961 Presence of intraocular lens: Secondary | ICD-10-CM | POA: Diagnosis not present

## 2023-11-16 DIAGNOSIS — I480 Paroxysmal atrial fibrillation: Secondary | ICD-10-CM | POA: Diagnosis not present

## 2023-11-16 DIAGNOSIS — M810 Age-related osteoporosis without current pathological fracture: Secondary | ICD-10-CM

## 2023-11-16 DIAGNOSIS — H25811 Combined forms of age-related cataract, right eye: Secondary | ICD-10-CM | POA: Diagnosis not present

## 2023-11-16 DIAGNOSIS — T466X5A Adverse effect of antihyperlipidemic and antiarteriosclerotic drugs, initial encounter: Secondary | ICD-10-CM

## 2023-11-16 DIAGNOSIS — N952 Postmenopausal atrophic vaginitis: Secondary | ICD-10-CM

## 2023-11-16 DIAGNOSIS — I447 Left bundle-branch block, unspecified: Secondary | ICD-10-CM

## 2023-11-16 DIAGNOSIS — I421 Obstructive hypertrophic cardiomyopathy: Secondary | ICD-10-CM

## 2023-11-16 DIAGNOSIS — Z950 Presence of cardiac pacemaker: Secondary | ICD-10-CM

## 2023-11-16 DIAGNOSIS — I442 Atrioventricular block, complete: Secondary | ICD-10-CM

## 2023-11-16 DIAGNOSIS — Z0001 Encounter for general adult medical examination with abnormal findings: Secondary | ICD-10-CM | POA: Diagnosis not present

## 2023-11-16 DIAGNOSIS — Z79899 Other long term (current) drug therapy: Secondary | ICD-10-CM

## 2023-11-16 DIAGNOSIS — K219 Gastro-esophageal reflux disease without esophagitis: Secondary | ICD-10-CM

## 2023-11-16 DIAGNOSIS — I712 Thoracic aortic aneurysm, without rupture, unspecified: Secondary | ICD-10-CM

## 2023-11-16 DIAGNOSIS — R6889 Other general symptoms and signs: Secondary | ICD-10-CM

## 2023-11-16 DIAGNOSIS — I10 Essential (primary) hypertension: Secondary | ICD-10-CM

## 2023-11-16 DIAGNOSIS — H40013 Open angle with borderline findings, low risk, bilateral: Secondary | ICD-10-CM | POA: Diagnosis not present

## 2023-11-16 DIAGNOSIS — H6121 Impacted cerumen, right ear: Secondary | ICD-10-CM | POA: Diagnosis not present

## 2023-11-16 DIAGNOSIS — E559 Vitamin D deficiency, unspecified: Secondary | ICD-10-CM | POA: Diagnosis not present

## 2023-11-16 DIAGNOSIS — E782 Mixed hyperlipidemia: Secondary | ICD-10-CM | POA: Diagnosis not present

## 2023-11-16 DIAGNOSIS — Z0000 Encounter for general adult medical examination without abnormal findings: Secondary | ICD-10-CM

## 2023-11-16 DIAGNOSIS — R7309 Other abnormal glucose: Secondary | ICD-10-CM

## 2023-11-16 DIAGNOSIS — F330 Major depressive disorder, recurrent, mild: Secondary | ICD-10-CM

## 2023-11-16 DIAGNOSIS — M791 Myalgia, unspecified site: Secondary | ICD-10-CM

## 2023-11-16 MED ORDER — CITALOPRAM HYDROBROMIDE 20 MG PO TABS: 20.0000 mg | ORAL_TABLET | Freq: Every day | ORAL | 2 refills | Status: DC

## 2023-11-16 NOTE — Patient Instructions (Signed)
 Start citalopram  20 mg daily for depression If symptoms are still not controlled notify the office  Managing Depression, Adult Depression is a mental health condition that affects your thoughts, feelings, and actions. Being diagnosed with depression can bring you relief if you did not know why you have felt or behaved a certain way. It could also leave you feeling overwhelmed. Finding ways to manage your symptoms can help you feel more positive about your future. How to manage lifestyle changes Being depressed is difficult. Depression can increase the level of everyday stress. Stress can make depression symptoms worse. You may believe your symptoms cannot be managed or will never improve. However, there are many things you can try to help manage your symptoms. There is hope. Managing stress  Stress is your body's reaction to life changes and events, both good and bad. Stress can add to your feelings of depression. Learning to manage your stress can help lessen your feelings of depression. Try some of the following approaches to reducing your stress (stress reduction techniques): Listen to music that you enjoy and that inspires you. Try using a meditation app or take a meditation class. Develop a practice that helps you connect with your spiritual self. Walk in nature, pray, or go to a place of worship. Practice deep breathing. To do this, inhale slowly through your nose. Pause at the top of your inhale for a few seconds and then exhale slowly, letting yourself relax. Repeat this three or four times. Practice yoga to help relax and work your muscles. Choose a stress reduction technique that works for you. These techniques take time and practice to develop. Set aside 5-15 minutes a day to do them. Therapists can offer training in these techniques. Do these things to help manage stress: Keep a journal. Know your limits. Set healthy boundaries for yourself and others, such as saying no when you think  something is too much. Pay attention to how you react to certain situations. You may not be able to control everything, but you can change your reaction. Add humor to your life by watching funny movies or shows. Make time for activities that you enjoy and that relax you. Spend less time using electronics, especially at night before bed. The light from screens can make your brain think it is time to get up rather than go to bed.  Medicines Medicines, such as antidepressants, are often a part of treatment for depression. Talk with your pharmacist or health care provider about all the medicines, supplements, and herbal products that you take, their possible side effects, and what medicines and other products are safe to take together. Make sure to report any side effects you may have to your health care provider. Relationships Your health care provider may suggest family therapy, couples therapy, or individual therapy as part of your treatment. How to recognize changes Everyone responds differently to treatment for depression. As you recover from depression, you may start to: Have more interest in doing activities. Feel more hopeful. Have more energy. Eat a more regular amount of food. Have better mental focus. It is important to recognize if your depression is not getting better or is getting worse. The symptoms you had in the beginning may return, such as: Feeling tired. Eating too much or too little. Sleeping too much or too little. Feeling restless, agitated, or hopeless. Trouble focusing or making decisions. Having unexplained aches and pains. Feeling irritable, angry, or aggressive. If you or your family members notice these symptoms coming  back, let your health care provider know right away. Follow these instructions at home: Activity Try to get some form of exercise each day, such as walking. Try yoga, mindfulness, or other stress reduction techniques. Participate in group  activities if you are able. Lifestyle Get enough sleep. Cut down on or stop using caffeine , tobacco, alcohol, and any other harmful substances. Eat a healthy diet that includes plenty of vegetables, fruits, whole grains, low-fat dairy products, and lean protein. Limit foods that are high in solid fats, added sugar, or salt (sodium). General instructions Take over-the-counter and prescription medicines only as told by your health care provider. Keep all follow-up visits. It is important for your health care provider to check on your mood, behavior, and medicines. Your health care provider may need to make changes to your treatment. Where to find support Talking to others  Friends and family members can be sources of support and guidance. Talk to trusted friends or family members about your condition. Explain your symptoms and let them know that you are working with a health care provider to treat your depression. Tell friends and family how they can help. Finances Find mental health providers that fit with your financial situation. Talk with your health care provider if you are worried about access to food, housing, or medicine. Call your insurance company to learn about your co-pays and prescription plan. Where to find more information You can find support in your area from: Anxiety and Depression Association of America (ADAA): adaa.org Mental Health America: mentalhealthamerica.net [employer] on Mental Illness: nami.org Contact a health care provider if: You stop taking your antidepressant medicines, and you have any of these symptoms: Nausea. Headache. Light-headedness. Chills and body aches. Not being able to sleep (insomnia). You or your friends and family think your depression is getting worse. Get help right away if: You have thoughts of hurting yourself or others. Get help right away if you feel like you may hurt yourself or others, or have thoughts about taking your own  life. Go to your nearest emergency room or: Call 911. Call the National Suicide Prevention Lifeline at (450)869-3415 or 988. This is open 24 hours a day. Text the Crisis Text Line at (254)418-9097. This information is not intended to replace advice given to you by your health care provider. Make sure you discuss any questions you have with your health care provider. Document Revised: 03/01/2022 Document Reviewed: 03/01/2022 Elsevier Patient Education  2024 [employer].

## 2023-11-17 LAB — COMPLETE METABOLIC PANEL WITH GFR
AG Ratio: 1.6 (calc) (ref 1.0–2.5)
ALT: 12 U/L (ref 6–29)
AST: 17 U/L (ref 10–35)
Albumin: 4.3 g/dL (ref 3.6–5.1)
Alkaline phosphatase (APISO): 115 U/L (ref 37–153)
BUN: 13 mg/dL (ref 7–25)
CO2: 28 mmol/L (ref 20–32)
Calcium: 10.2 mg/dL (ref 8.6–10.4)
Chloride: 108 mmol/L (ref 98–110)
Creat: 0.84 mg/dL (ref 0.60–0.95)
Globulin: 2.7 g/dL (ref 1.9–3.7)
Glucose, Bld: 100 mg/dL — ABNORMAL HIGH (ref 65–99)
Potassium: 5.4 mmol/L — ABNORMAL HIGH (ref 3.5–5.3)
Sodium: 145 mmol/L (ref 135–146)
Total Bilirubin: 0.5 mg/dL (ref 0.2–1.2)
Total Protein: 7 g/dL (ref 6.1–8.1)
eGFR: 69 mL/min/{1.73_m2} (ref >=60)

## 2023-11-17 LAB — LIPID PANEL
Cholesterol: 200 mg/dL — ABNORMAL HIGH (ref <200)
HDL: 55 mg/dL (ref >=50)
LDL Cholesterol (Calc): 121 mg/dL — ABNORMAL HIGH
Non-HDL Cholesterol (Calc): 145 mg/dL — ABNORMAL HIGH (ref <130)
Total CHOL/HDL Ratio: 3.6 (calc) (ref <5.0)
Triglycerides: 126 mg/dL (ref <150)

## 2023-11-17 LAB — CBC WITH DIFFERENTIAL/PLATELET
Absolute Lymphocytes: 2063 {cells}/uL (ref 850–3900)
Absolute Monocytes: 385 {cells}/uL (ref 200–950)
Basophils Absolute: 39 {cells}/uL (ref 0–200)
Basophils Relative: 0.7 %
Eosinophils Absolute: 77 {cells}/uL (ref 15–500)
Eosinophils Relative: 1.4 %
HCT: 42.5 % (ref 35.0–45.0)
Hemoglobin: 13.6 g/dL (ref 11.7–15.5)
MCH: 28 pg (ref 27.0–33.0)
MCHC: 32 g/dL (ref 32.0–36.0)
MCV: 87.6 fL (ref 80.0–100.0)
MPV: 10.9 fL (ref 7.5–12.5)
Monocytes Relative: 7 %
Neutro Abs: 2937 {cells}/uL (ref 1500–7800)
Neutrophils Relative %: 53.4 %
Platelets: 209 10*3/uL (ref 140–400)
RBC: 4.85 10*6/uL (ref 3.80–5.10)
RDW: 12.5 % (ref 11.0–15.0)
Total Lymphocyte: 37.5 %
WBC: 5.5 10*3/uL (ref 3.8–10.8)

## 2023-11-17 LAB — MAGNESIUM: Magnesium: 2.4 mg/dL (ref 1.5–2.5)

## 2023-11-21 ENCOUNTER — Telehealth (HOSPITAL_COMMUNITY): Payer: Self-pay | Admitting: *Deleted

## 2023-11-21 NOTE — Telephone Encounter (Signed)
 Gave instructions for stress echo.

## 2023-11-22 ENCOUNTER — Ambulatory Visit: Payer: Medicare PPO | Admitting: Internal Medicine

## 2023-11-23 ENCOUNTER — Ambulatory Visit (HOSPITAL_BASED_OUTPATIENT_CLINIC_OR_DEPARTMENT_OTHER): Payer: Medicare PPO

## 2023-11-23 ENCOUNTER — Ambulatory Visit (HOSPITAL_COMMUNITY): Payer: Medicare PPO | Attending: Cardiology

## 2023-11-23 ENCOUNTER — Other Ambulatory Visit: Payer: Self-pay | Admitting: Internal Medicine

## 2023-11-23 DIAGNOSIS — I421 Obstructive hypertrophic cardiomyopathy: Secondary | ICD-10-CM | POA: Diagnosis not present

## 2023-11-23 LAB — ECHOCARDIOGRAM COMPLETE
AR max vel: 2.82 cm2
AV Area VTI: 2.71 cm2
AV Area mean vel: 2.85 cm2
AV Mean grad: 4.8 mm[Hg]
AV Peak grad: 8.4 mm[Hg]
Ao pk vel: 1.45 m/s
Area-P 1/2: 2.96 cm2
S' Lateral: 2.3 cm

## 2023-11-24 ENCOUNTER — Ambulatory Visit: Payer: Medicare PPO

## 2023-11-24 ENCOUNTER — Ambulatory Visit: Payer: Medicare PPO | Attending: Internal Medicine | Admitting: Internal Medicine

## 2023-11-24 VITALS — BP 118/70 | HR 65 | Ht 65.0 in | Wt 170.0 lb

## 2023-11-24 DIAGNOSIS — I10 Essential (primary) hypertension: Secondary | ICD-10-CM

## 2023-11-24 DIAGNOSIS — I480 Paroxysmal atrial fibrillation: Secondary | ICD-10-CM | POA: Diagnosis not present

## 2023-11-24 DIAGNOSIS — I421 Obstructive hypertrophic cardiomyopathy: Secondary | ICD-10-CM

## 2023-11-24 LAB — CUP PACEART REMOTE DEVICE CHECK
Battery Remaining Longevity: 82 mo
Battery Remaining Percentage: 91 %
Battery Voltage: 2.98 V
Brady Statistic AP VP Percent: 87 %
Brady Statistic AP VS Percent: 1 %
Brady Statistic AS VP Percent: 13 %
Brady Statistic AS VS Percent: 1 %
Brady Statistic RA Percent Paced: 87 %
Brady Statistic RV Percent Paced: 99 %
Date Time Interrogation Session: 20250117052836
Implantable Lead Connection Status: 753985
Implantable Lead Connection Status: 753985
Implantable Lead Implant Date: 20140805
Implantable Lead Implant Date: 20140805
Implantable Lead Location: 753859
Implantable Lead Location: 753860
Implantable Lead Model: 1944
Implantable Lead Model: 1948
Implantable Pulse Generator Implant Date: 20240119
Lead Channel Impedance Value: 510 Ohm
Lead Channel Impedance Value: 580 Ohm
Lead Channel Pacing Threshold Amplitude: 0.25 V
Lead Channel Pacing Threshold Amplitude: 0.75 V
Lead Channel Pacing Threshold Pulse Width: 0.5 ms
Lead Channel Pacing Threshold Pulse Width: 0.8 ms
Lead Channel Sensing Intrinsic Amplitude: 12 mV
Lead Channel Sensing Intrinsic Amplitude: 3.6 mV
Lead Channel Setting Pacing Amplitude: 2.5 V
Lead Channel Setting Pacing Amplitude: 2.5 V
Lead Channel Setting Pacing Pulse Width: 0.5 ms
Lead Channel Setting Sensing Sensitivity: 4 mV
Pulse Gen Model: 2272
Pulse Gen Serial Number: 8145790

## 2023-11-24 MED ORDER — DILTIAZEM HCL ER COATED BEADS 360 MG PO CP24: 360.0000 mg | ORAL_CAPSULE | Freq: Every day | ORAL | 3 refills | Status: DC

## 2023-11-24 NOTE — Progress Notes (Signed)
Cardiology Office Note:  .    Date:  11/24/2023  ID:  Alexandra Wheeler, DOB Jan 20, 1941, MRN [id number] PCP: Lucky Cowboy, MD  [facility] Providers Cardiologist:  Christell Constant, MD Cardiology APP:  Beatrice Lecher, PA-C  Electrophysiologist:  Will Jorja Loa, MD     CC: [facility] follow up  History of Present Illness: .    Alexandra Wheeler is a 83 y.o. female ,  with a complex medical history, presents with concerns about hypertrophic obstructive cardiomyopathy. She reports variable blood pressure readings, with measurements as high as 180s systolic. The patient has a history of anxiety and has recently increased dietary sodium intake. She has multiple medication allergies and has been resistant to taking certain medications. Currently, she is on amlodipine 5mg , which has helped to lower her blood pressure.  The patient also has hyperlipidemia, for which she is taking red yeast rice extract and omega-3 supplements due to previous statin myalgia. She reports fatigue and difficulty with mobility, which she attributes to her current medications, including metoprolol, Eliquis, and amlodipine. She describes feeling "fuzzy" and having issues with balance, particularly after standing for extended periods. She denies any episodes of syncope but reports near syncope on Christmas Eve, which prompted an ER visit.  The patient has been monitoring her blood pressure at home, with readings as high as 190 systolic. She denies chest pain but reports discomfort in the upper chest area. She has not noticed any significant swelling. She also reports occasional episodes of feeling her heart beating out of her chest, which she associates with anxiety. She has good and bad days, with the bad days characterized by high anxiety and elevated blood pressure.  The patient has a history of obstructive hypertrophic cardiomyopathy, which has been confirmed by a recent echocardiogram. She reports feeling  well at rest but experiences shortness of breath with exertion. She has been managing her symptoms with hydration and medication adjustments, but expresses a desire for improved symptom control. She is open to trying a new medication regimen to potentially alleviate her fatigue and improve her overall well-being. She also expresses concerns about mobility and the possibility of obtaining a handicap placard for easier access to medical appointments.  Relevant histories: .  Social - former [facility] patient  ROS: As per HPI.   Studies Reviewed: .   Cardiac Studies & Procedures     STRESS TESTS  MYOCARDIAL PERFUSION IMAGING 11/05/2020  Narrative  Nuclear stress EF: 69%.  Blood pressure demonstrated a normal response to exercise.  The study is normal.  This is a low risk study.  The left ventricular ejection fraction is hyperdynamic (>65%).  Negative nuclear medicine stress test for ischemia or infarction.  ECHOCARDIOGRAM  ECHOCARDIOGRAM COMPLETE 11/23/2023  Narrative ECHOCARDIOGRAM REPORT    Patient Name:   Alexandra Wheeler Date of Exam: 11/23/2023 Medical Rec #:  [id number]        Height:       65.0 in Accession #:    [id number]       Weight:       176.6 lb Date of Birth:  1941-01-18       BSA:          1.876 m Patient Age:    82 years         BP:           111/70 mmHg Patient Gender: F  HR:           60 bpm. Exam Location:  [address]  Procedure: 2D Echo, Color Doppler and Cardiac Doppler  Indications:     I42.1 HCM Squat to stand  History:         Patient has prior history of Echocardiogram examinations, most recent 05/19/2022. Pacemaker; Arrythmias:LBBB and Paroxysmal Atrial Fibrillation. HCM.  Sonographer:     Samule Ohm RDCS Referring Phys:  [id number] [facility] A Izora Ribas Diagnosing Phys: Weston Brass MD   Sonographer Comments: Squat to Stand Provocation Protocol (Mayo Protocol) IMPRESSIONS   1. Dynamic maneuvers performed to elicit LVOT  obstruction:. 2. At rest, no LVOT obstruction. There is flow acceleration at the basal septum and mild systolic anterior motion of the mitral valve, primarily chordal. Mild-moderate MR. HR 60 bpm. Valsalva- no LVOT obstruction. 3. With repetitive squat to stand, LVOT maximal instantaneous gradient 52 mmHg, HR 101 bpm, systolic anterior motion of the mitral valve. MR was challenging to interrogate due to HR recovery however appeared to mild-moderate. Patient described mild dizziness but recovered quickly. 4. Left ventricular ejection fraction, by estimation, is 65 to 70%. The left ventricle has normal function. The left ventricle has no regional wall motion abnormalities. There is severe asymmetric left ventricular hypertrophy of the septal segment (18 mm). Left ventricular diastolic parameters are consistent with Grade I diastolic dysfunction (impaired relaxation). 5. Right ventricular systolic function is normal. The right ventricular size is normal. There is normal pulmonary artery systolic pressure. The estimated right ventricular systolic pressure is 33.0 mmHg. 6. Left atrial size was mildly dilated. 7. The mitral valve is grossly normal. Mild to moderate mitral valve regurgitation. No evidence of mitral stenosis. 8. Tricuspid valve regurgitation is mild to moderate. 9. The aortic valve is grossly normal. Aortic valve regurgitation is mild. No aortic stenosis is present.  FINDINGS Left Ventricle: Left ventricular ejection fraction, by estimation, is 65 to 70%. The left ventricle has normal function. The left ventricle has no regional wall motion abnormalities. The left ventricular internal cavity size was normal in size. There is severe asymmetric left ventricular hypertrophy of the septal segment. Left ventricular diastolic parameters are consistent with Grade I diastolic dysfunction (impaired relaxation).  Right Ventricle: The right ventricular size is normal. No increase in right ventricular  wall thickness. Right ventricular systolic function is normal. There is normal pulmonary artery systolic pressure. The tricuspid regurgitant velocity is 2.50 m/s, and with an assumed right atrial pressure of 8 mmHg, the estimated right ventricular systolic pressure is 33.0 mmHg.  Left Atrium: LA strain performed but not reported due to suboptimal tracking. Left atrial size was mildly dilated.  Right Atrium: Right atrial size was normal in size.  Pericardium: There is no evidence of pericardial effusion.  Mitral Valve: The mitral valve is grossly normal. Mild to moderate mitral valve regurgitation. No evidence of mitral valve stenosis.  Tricuspid Valve: The tricuspid valve is normal in structure. Tricuspid valve regurgitation is mild to moderate. No evidence of tricuspid stenosis.  Aortic Valve: The aortic valve is grossly normal. Aortic valve regurgitation is mild. No aortic stenosis is present. Aortic valve mean gradient measures 4.8 mmHg. Aortic valve peak gradient measures 8.4 mmHg. Aortic valve area, by VTI measures 2.71 cm.  Pulmonic Valve: The pulmonic valve was normal in structure. Pulmonic valve regurgitation is not visualized. No evidence of pulmonic stenosis.  Aorta: The aortic root is normal in size and structure. Ascending aorta measurements are within normal limits for age when indexed  to body surface area.  Venous: The inferior vena cava was not well visualized.  IAS/Shunts: The atrial septum is grossly normal.  Additional Comments: A device lead is visualized in the right atrium and right ventricle.   LEFT VENTRICLE PLAX 2D LVIDd:         3.20 cm   Diastology LVIDs:         2.30 cm   LV e' medial:    4.03 cm/s LV PW:         1.10 cm   LV E/e' medial:  16.4 LV IVS:        1.80 cm   LV e' lateral:   4.24 cm/s LVOT diam:     2.00 cm   LV E/e' lateral: 15.6 LV SV:         81 LV SV Index:   43 LVOT Area:     3.14 cm   LEFT ATRIUM           Index LA diam:      2.80  cm 1.49 cm/m LA Vol (A4C): 42.0 ml 22.39 ml/m AORTIC VALVE AV Area (Vmax):    2.82 cm AV Area (Vmean):   2.85 cm AV Area (VTI):     2.71 cm AV Vmax:           144.84 cm/s AV Vmean:          104.246 cm/s AV VTI:            0.300 m AV Peak Grad:      8.4 mmHg AV Mean Grad:      4.8 mmHg LVOT Vmax:         130.00 cm/s LVOT Vmean:        94.600 cm/s LVOT VTI:          0.259 m LVOT/AV VTI ratio: 0.86  AORTA Ao Root diam: 3.60 cm Ao Asc diam:  3.90 cm  MITRAL VALVE               TRICUSPID VALVE MV Area (PHT): 2.96 cm    TR Peak grad:   25.0 mmHg MV Decel Time: 256 msec    TR Vmax:        250.00 cm/s MV E velocity: 66.00 cm/s MV A velocity: 88.00 cm/s  SHUNTS MV E/A ratio:  0.75        Systemic VTI:  0.26 m Systemic Diam: 2.00 cm  Weston Brass MD Electronically signed by Weston Brass MD Signature Date/Time: 11/23/2023/5:08:48 PM    Final (Updated)     CARDIAC MRI  MR CARDIAC MORPHOLOGY W WO CONTRAST 10/03/2023  Narrative CLINICAL DATA:  77F with HCM, PAF, CHB s/p PPM  EXAM: CARDIAC MRI  TECHNIQUE: The patient was scanned on a 1.5 Tesla Siemens magnet. A dedicated cardiac coil was used. Functional imaging was done using Fiesta sequences. 2,3, and 4 chamber views were done to assess for RWMA's. Modified Simpson's rule using a short axis stack was used to calculate an ejection fraction on a dedicated work [employer]. The patient received 10 cc of Gadavist. After 10 minutes inversion recovery sequences were used to assess for infiltration and scar tissue. Phase contrast velocity mapping was performed above the aortic and pulmonic valves  CONTRAST:  10 cc  of Gadavist  FINDINGS: Left ventricle:  -Asymmetric LV hypertrophy measuring 15mm in basal septum (8mm in posterior wall)  -Normal size  -Normal systolic function  -Normal ECV (27%)  -Patchy LGE in basal  septum and RV insertion site, consistent with HCM. LGE accounts for 5% of  total myocardial mass  LV EF:  56% (Normal 52-79%)  Absolute volumes:  LV EDV: (Normal 78-167 mL)  LV ESV: 48mL (Normal 21-64 mL)  LV SV: 60mL (Normal 52-114 mL)  CO: 5.1L/min (Normal 2.7-6.3 L/min)  Indexed volumes:  LV EDV: 55mL/sq-m (Normal 50-96 mL/sq-m)  LV ESV: 81mL/sq-m (Normal 10-40 mL/sq-m)  LV SV: 74mL/sq-m (Normal 33-64 mL/sq-m)  CI: 2.7L/min/sq-m (Normal 1.9-3.9 L/min/sq-m)  Right ventricle: Normal size and mild systolic dysfunction. Focal region of dyskinesis in RV free wall  RV EF: 46% (Normal 52-80%)  Absolute volumes:  RV EDV: (Normal 79-175 mL)  RV ESV: 76mL (Normal 13-75 mL)  RV SV: 65mL (Normal 56-110 mL)  CO: 5.6L/min (Normal 2.7-6 L/min)  Indexed volumes:  RV EDV: 69mL/sq-m (Normal 51-97 mL/sq-m)  RV ESV: 7mL/sq-m (Normal 9-42 mL/sq-m)  RV SV: 75mL/sq-m (Normal 35-61 mL/sq-m)  CI: 2.9L/min/sq-m (Normal 1.8-3.8 L/min/sq-m)  Left atrium: Normal size  Right atrium: Mild enlargement  Mitral valve: Mild to moderate regurgitation (regurgitant fraction 20%)  Aortic valve: Trivial regurgitation  Tricuspid valve: Moderate regurgitation (regurgitant fraction 31%)  Pulmonic valve: No regurgitation  Aorta: Dilated ascending aorta measuring 43mm  Pericardium: Normal  IMPRESSION: 1. Asymmetric LV hypertrophy measuring 15mm in basal septum (8mm in posterior wall), meeting criteria for hypertrophic cardiomyopathy  2. Patchy LGE in basal septum and RV insertion site, consistent with HCM. LGE accounts for 5% of total myocardial mass  3.  Normal LV size and systolic function (EF 56%)  4. Normal RV size with mild systolic dysfunction (EF 46%). Focal region of dyskinesis in RV free wall  5.  Moderate tricuspid regurgitation (regurgitant fraction 31%)  6.  Mild to moderate mitral regurgitation (regurgitant fraction 20%)  7.  Dilated ascending aorta measuring 43mm   Electronically Signed By: Epifanio Lesches  M.D. On: 10/04/2023 22:03         Physical Exam:    VS:  BP 118/70 (BP Location: Right Arm)   Pulse 65   Ht 5\' 5"  (1.651 m)   Wt 170 lb (77.1 kg)   SpO2 95%   BMI 28.29 kg/m    Wt Readings from Last 3 Encounters:  11/24/23 170 lb (77.1 kg)  11/16/23 176 lb 9.6 oz (80.1 kg)  10/30/23 170 lb (77.1 kg)    Gen: no distress   Neck: No JVD Cardiac: No Rubs or Gallops, harsh holosystolic murmur worse with standing, RRR, +2 radial pulses Respiratory: Clear to auscultation bilaterally, normal effort, normal  respiratory rate GI: Soft, nontender, non-distended  MS: No  edema;  moves all extremities Integument: Skin feels warm Neuro:  At time of evaluation, alert and oriented to person/place/time/situation  Psych: anxious affect, patient feels poorly   ASSESSMENT AND PLAN: .    Hypertrophic Obstructive Cardiomyopathy Echocardiogram confirms hypertrophic obstructive cardiomyopathy with dynamic LVOT obstruction. Symptoms include fatigue, lightheadedness, and variable blood pressure. Current medications (metoprolol and amlodipine) may contribute to fatigue. Discussed switching to diltiazem to manage symptoms and blood pressure. Risks include potential for increased blood pressure; benefits include potential reduction in fatigue and better symptom control. Patient agreed to trial diltiazem. If symptoms do not improve, further interventions such as alcohol septal ablation or open-heart surgery may be considered, though these are less likely due to age. - Stop metoprolol and amlodipine - no  AF - Start diltiazem 360 mg - Follow-up on December 13, 2023 to assess response to medication change -  If symptoms worsen or blood pressure increases significantly, revert to previous medication regimen and contact clinic  Hypertension Variable blood pressure readings with recent high readings of 190/111 mmHg. Blood pressure management complicated by medication intolerance and anxiety. Current regimen  includes metoprolol and amlodipine, which will be switched to diltiazem. - Monitor blood pressure regularly  Anxiety Significant anxiety contributing to symptom burden and variable blood pressure. Anxiety may exacerbate hypertrophic obstructive cardiomyopathy symptoms. Discussed that anxiety management may improve overall symptoms. - Continue current anxiety management strategies  Hyperlipidemia Managed with red yeast rice extract and omega-3 supplements due to statin myalgia. - will not escalate treatment until after we improve her sx  General Health Maintenance Inquired about obtaining a handicap placard due to mobility issues. - Assist in obtaining a handicap placard  Follow-up - Follow-up appointment on December 13, 2023.  Time Spent Directly with Patient:   I have spent a total of 41 minutes with the patient reviewing notes, imaging, EKGs, labs, and examining the patient as well as establishing an assessment and plan that was discussed personally with the patient. Discussed disease state education, we reviewed her echo at length including reviewing the images.    Riley Lam, MD FASE [facility] Cardiologist [facility]  [address] [place] [place], [place] [address] [phone]  5:18 PM

## 2023-11-24 NOTE — Patient Instructions (Signed)
Medication Instructions:  Your physician has recommended you make the following change in your medication:  STOP: amlodipine STOP: metoprolol   START: Diltiazem (Cardizem) 360 mg by mouth once daily   *If you need a refill on your cardiac medications before your next appointment, please call your pharmacy*   Lab Work: NONE If you have labs (blood work) drawn today and your tests are completely normal, you will receive your results only by: MyChart Message (if you have MyChart) OR A paper copy in the mail If you have any lab test that is abnormal or we need to change your treatment, we will call you to review the results.   Testing/Procedures: NONE   Follow-Up:As scheduled  At [facility], you and your health needs are our priority.  As part of our continuing mission to provide you with exceptional heart care, we have created designated Provider Care Teams.  These Care Teams include your primary Cardiologist (physician) and Advanced Practice Providers (APPs -  Physician Assistants and Nurse Practitioners) who all work together to provide you with the care you need, when you need it.  Provider:   Christell Constant, MD

## 2023-11-30 ENCOUNTER — Ambulatory Visit
Admission: RE | Admit: 2023-11-30 | Discharge: 2023-11-30 | Disposition: A | Discharge status: Home / Self Care | Payer: Medicare PPO | Source: Ambulatory Visit | Attending: Internal Medicine | Admitting: Internal Medicine

## 2023-11-30 DIAGNOSIS — Z1231 Encounter for screening mammogram for malignant neoplasm of breast: Secondary | ICD-10-CM | POA: Diagnosis not present

## 2023-12-13 ENCOUNTER — Ambulatory Visit: Payer: Medicare PPO | Attending: Internal Medicine | Admitting: Internal Medicine

## 2023-12-13 ENCOUNTER — Encounter: Payer: Self-pay | Admitting: Internal Medicine

## 2023-12-13 VITALS — BP 118/84 | HR 88 | Ht 65.0 in | Wt 175.0 lb

## 2023-12-13 DIAGNOSIS — I340 Nonrheumatic mitral (valve) insufficiency: Secondary | ICD-10-CM | POA: Diagnosis not present

## 2023-12-13 DIAGNOSIS — I421 Obstructive hypertrophic cardiomyopathy: Secondary | ICD-10-CM | POA: Diagnosis not present

## 2023-12-13 DIAGNOSIS — I10 Essential (primary) hypertension: Secondary | ICD-10-CM

## 2023-12-13 DIAGNOSIS — I480 Paroxysmal atrial fibrillation: Secondary | ICD-10-CM

## 2023-12-13 MED ORDER — METOPROLOL SUCCINATE ER 25 MG PO TB24: 25.0000 mg | ORAL_TABLET | Freq: Every day | ORAL | 3 refills | Status: DC

## 2023-12-13 NOTE — Patient Instructions (Signed)
 Medication Instructions:  Your physician has recommended you make the following change in your medication:  START: metoprolol  succinate (Toprol  XL) 25 mg by mouth once daily  *If you need a refill on your cardiac medications before your next appointment, please call your pharmacy*   Lab Work: NONE  If you have labs (blood work) drawn today and your tests are completely normal, you will receive your results only by: MyChart Message (if you have MyChart) OR A paper copy in the mail If you have any lab test that is abnormal or we need to change your treatment, we will call you to review the results.   Testing/Procedures: NONE    Follow-Up: At [facility], you and your health needs are our priority.  As part of our continuing mission to provide you with exceptional heart care, we have created designated Provider Care Teams.  These Care Teams include your primary Cardiologist (physician) and Advanced Practice Providers (APPs -  Physician Assistants and Nurse Practitioners) who all work together to provide you with the care you need, when you need it.   Your next appointment:   3 month(s)  Provider:   Dayna Dunn, PA-C or Glendia Ferrier, PA-C

## 2023-12-13 NOTE — Progress Notes (Signed)
 Cardiology Office Note:  .    Date:  12/13/2023  ID:  Alexandra Wheeler, DOB May 14, 1941, MRN [id number] PCP: Tonita Fallow, MD  [facility] Providers Cardiologist:  Stanly DELENA Leavens, MD Cardiology APP:  Lelon Glendia DASEN, PA-C  Electrophysiologist:  Will Gladis Norton, MD     CC: [facility] follow up  History of Present Illness: .    Alexandra Wheeler is a 83 y.o. female , with hypertrophic obstructive cardiomyopathy who presents with medication-related side effects and symptom management.  She is experiencing significant side effects from her current medication regimen, which includes diltiazem  and Eliquis . After starting diltiazem  at the highest dose possible, she experienced severe symptoms including a sensation of having a heart attack, with 'pounding chest' and 'fluttering' sensations. These symptoms were most pronounced during the first two nights of taking the medication but have since improved.  She describes persistent shaking and quivering throughout her body, which she attributes to the medication changes. This shaking is severe enough that she struggles to hold a cup of coffee. These symptoms began after starting the current medication regimen, which includes diltiazem  and Eliquis . She previously experienced some shaking in her left hand, but it has now progressed to her entire body.  She feels lightheaded and sometimes as if she might pass out, particularly when walking. She also experiences fatigue, which she found more manageable when she was on metoprolol  without Eliquis . She was previously on metoprolol  at a dose of 200 mg, which she tolerated better despite the fatigue, compared to her current symptoms.  She has noticed new red spots on her skin and describes a sensation of a film over her eyes, similar to 'crying and there's that water in the eyes.' She has been evaluated by an eye doctor, who found no issues with her vision.  She has a history of hypertrophic  obstructive cardiomyopathy and has undergone a stress test in the past, during which she may have felt dizzy or off balance. She has been on various medications, including metoprolol  and diltiazem , to manage her symptoms.  Relevant histories: .  Social - former [facility] patient  ROS: As per HPI.   Studies Reviewed: .   Cardiac Studies & Procedures     STRESS TESTS  MYOCARDIAL PERFUSION IMAGING 11/05/2020  Narrative  Nuclear stress EF: 69%.  Blood pressure demonstrated a normal response to exercise.  The study is normal.  This is a low risk study.  The left ventricular ejection fraction is hyperdynamic (>65%).  Negative nuclear medicine stress test for ischemia or infarction.  ECHOCARDIOGRAM  ECHOCARDIOGRAM COMPLETE 11/23/2023  Narrative ECHOCARDIOGRAM REPORT    Patient Name:   Alexandra Wheeler Date of Exam: 11/23/2023 Medical Rec #:  [id number]        Height:       65.0 in Accession #:    [id number]       Weight:       176.6 lb Date of Birth:  1941/08/23       BSA:          1.876 m Patient Age:    82 years         BP:           111/70 mmHg Patient Gender: F                HR:           60 bpm. Exam Location:  [address]  Procedure: 2D Echo, Color Doppler and Cardiac  Doppler  Indications:     I42.1 HCM Squat to stand  History:         Patient has prior history of Echocardiogram examinations, most recent 05/19/2022. Pacemaker; Arrythmias:LBBB and Paroxysmal Atrial Fibrillation. HCM.  Sonographer:     Elsie Bohr RDCS Referring Phys:  [id number] [facility] Diagnosing Phys: Soyla Merck MD   Sonographer Comments: Squat to Stand Provocation Protocol (Mayo Protocol) IMPRESSIONS   1. Dynamic maneuvers performed to elicit LVOT obstruction:. 2. At rest, no LVOT obstruction. There is flow acceleration at the basal septum and mild systolic anterior motion of the mitral valve, primarily chordal. Mild-moderate MR. HR 60 bpm. Valsalva- no LVOT  obstruction. 3. With repetitive squat to stand, LVOT maximal instantaneous gradient 52 mmHg, HR 101 bpm, systolic anterior motion of the mitral valve. MR was challenging to interrogate due to HR recovery however appeared to mild-moderate. Patient described mild dizziness but recovered quickly. 4. Left ventricular ejection fraction, by estimation, is 65 to 70%. The left ventricle has normal function. The left ventricle has no regional wall motion abnormalities. There is severe asymmetric left ventricular hypertrophy of the septal segment (18 mm). Left ventricular diastolic parameters are consistent with Grade I diastolic dysfunction (impaired relaxation). 5. Right ventricular systolic function is normal. The right ventricular size is normal. There is normal pulmonary artery systolic pressure. The estimated right ventricular systolic pressure is 33.0 mmHg. 6. Left atrial size was mildly dilated. 7. The mitral valve is grossly normal. Mild to moderate mitral valve regurgitation. No evidence of mitral stenosis. 8. Tricuspid valve regurgitation is mild to moderate. 9. The aortic valve is grossly normal. Aortic valve regurgitation is mild. No aortic stenosis is present.  FINDINGS Left Ventricle: Left ventricular ejection fraction, by estimation, is 65 to 70%. The left ventricle has normal function. The left ventricle has no regional wall motion abnormalities. The left ventricular internal cavity size was normal in size. There is severe asymmetric left ventricular hypertrophy of the septal segment. Left ventricular diastolic parameters are consistent with Grade I diastolic dysfunction (impaired relaxation).  Right Ventricle: The right ventricular size is normal. No increase in right ventricular wall thickness. Right ventricular systolic function is normal. There is normal pulmonary artery systolic pressure. The tricuspid regurgitant velocity is 2.50 m/s, and with an assumed right atrial pressure of 8 mmHg,  the estimated right ventricular systolic pressure is 33.0 mmHg.  Left Atrium: LA strain performed but not reported due to suboptimal tracking. Left atrial size was mildly dilated.  Right Atrium: Right atrial size was normal in size.  Pericardium: There is no evidence of pericardial effusion.  Mitral Valve: The mitral valve is grossly normal. Mild to moderate mitral valve regurgitation. No evidence of mitral valve stenosis.  Tricuspid Valve: The tricuspid valve is normal in structure. Tricuspid valve regurgitation is mild to moderate. No evidence of tricuspid stenosis.  Aortic Valve: The aortic valve is grossly normal. Aortic valve regurgitation is mild. No aortic stenosis is present. Aortic valve mean gradient measures 4.8 mmHg. Aortic valve peak gradient measures 8.4 mmHg. Aortic valve area, by VTI measures 2.71 cm.  Pulmonic Valve: The pulmonic valve was normal in structure. Pulmonic valve regurgitation is not visualized. No evidence of pulmonic stenosis.  Aorta: The aortic root is normal in size and structure. Ascending aorta measurements are within normal limits for age when indexed to body surface area.  Venous: The inferior vena cava was not well visualized.  IAS/Shunts: The atrial septum is grossly normal.  Additional Comments: A  device lead is visualized in the right atrium and right ventricle.   LEFT VENTRICLE PLAX 2D LVIDd:         3.20 cm   Diastology LVIDs:         2.30 cm   LV e' medial:    4.03 cm/s LV PW:         1.10 cm   LV E/e' medial:  16.4 LV IVS:        1.80 cm   LV e' lateral:   4.24 cm/s LVOT diam:     2.00 cm   LV E/e' lateral: 15.6 LV SV:         81 LV SV Index:   43 LVOT Area:     3.14 cm   LEFT ATRIUM           Index LA diam:      2.80 cm 1.49 cm/m LA Vol (A4C): 42.0 ml 22.39 ml/m AORTIC VALVE AV Area (Vmax):    2.82 cm AV Area (Vmean):   2.85 cm AV Area (VTI):     2.71 cm AV Vmax:           144.84 cm/s AV Vmean:          104.246 cm/s AV  VTI:            0.300 m AV Peak Grad:      8.4 mmHg AV Mean Grad:      4.8 mmHg LVOT Vmax:         130.00 cm/s LVOT Vmean:        94.600 cm/s LVOT VTI:          0.259 m LVOT/AV VTI ratio: 0.86  AORTA Ao Root diam: 3.60 cm Ao Asc diam:  3.90 cm  MITRAL VALVE               TRICUSPID VALVE MV Area (PHT): 2.96 cm    TR Peak grad:   25.0 mmHg MV Decel Time: 256 msec    TR Vmax:        250.00 cm/s MV E velocity: 66.00 cm/s MV A velocity: 88.00 cm/s  SHUNTS MV E/A ratio:  0.75        Systemic VTI:  0.26 m Systemic Diam: 2.00 cm  Soyla Merck MD Electronically signed by Soyla Merck MD Signature Date/Time: 11/23/2023/5:08:48 PM    Final (Updated)     CARDIAC MRI  MR CARDIAC MORPHOLOGY W WO CONTRAST 10/03/2023  Narrative CLINICAL DATA:  64F with HCM, PAF, CHB s/p PPM  EXAM: CARDIAC MRI  TECHNIQUE: The patient was scanned on a 1.5 Tesla Siemens magnet. A dedicated cardiac coil was used. Functional imaging was done using Fiesta sequences. 2,3, and 4 chamber views were done to assess for RWMA's. Modified Simpson's rule using a short axis stack was used to calculate an ejection fraction on a dedicated work [employer]. The patient received 10 cc of Gadavist . After 10 minutes inversion recovery sequences were used to assess for infiltration and scar tissue. Phase contrast velocity mapping was performed above the aortic and pulmonic valves  CONTRAST:  10 cc  of Gadavist   FINDINGS: Left ventricle:  -Asymmetric LV hypertrophy measuring 15mm in basal septum (8mm in posterior wall)  -Normal size  -Normal systolic function  -Normal ECV (27%)  -Patchy LGE in basal septum and RV insertion site, consistent with HCM. LGE accounts for 5% of total myocardial mass  LV EF:  56% (Normal 52-79%)  Absolute volumes:  LV EDV: (Normal 78-167 mL)  LV ESV: 48mL (Normal 21-64 mL)  LV SV: 60mL (Normal 52-114 mL)  CO: 5.1L/min (Normal 2.7-6.3  L/min)  Indexed volumes:  LV EDV: 83mL/sq-m (Normal 50-96 mL/sq-m)  LV ESV: 47mL/sq-m (Normal 10-40 mL/sq-m)  LV SV: 78mL/sq-m (Normal 33-64 mL/sq-m)  CI: 2.7L/min/sq-m (Normal 1.9-3.9 L/min/sq-m)  Right ventricle: Normal size and mild systolic dysfunction. Focal region of dyskinesis in RV free wall  RV EF: 46% (Normal 52-80%)  Absolute volumes:  RV EDV: (Normal 79-175 mL)  RV ESV: 76mL (Normal 13-75 mL)  RV SV: 65mL (Normal 56-110 mL)  CO: 5.6L/min (Normal 2.7-6 L/min)  Indexed volumes:  RV EDV: 73mL/sq-m (Normal 51-97 mL/sq-m)  RV ESV: 64mL/sq-m (Normal 9-42 mL/sq-m)  RV SV: 4mL/sq-m (Normal 35-61 mL/sq-m)  CI: 2.9L/min/sq-m (Normal 1.8-3.8 L/min/sq-m)  Left atrium: Normal size  Right atrium: Mild enlargement  Mitral valve: Mild to moderate regurgitation (regurgitant fraction 20%)  Aortic valve: Trivial regurgitation  Tricuspid valve: Moderate regurgitation (regurgitant fraction 31%)  Pulmonic valve: No regurgitation  Aorta: Dilated ascending aorta measuring 43mm  Pericardium: Normal  IMPRESSION: 1. Asymmetric LV hypertrophy measuring 15mm in basal septum (8mm in posterior wall), meeting criteria for hypertrophic cardiomyopathy  2. Patchy LGE in basal septum and RV insertion site, consistent with HCM. LGE accounts for 5% of total myocardial mass  3.  Normal LV size and systolic function (EF 56%)  4. Normal RV size with mild systolic dysfunction (EF 46%). Focal region of dyskinesis in RV free wall  5.  Moderate tricuspid regurgitation (regurgitant fraction 31%)  6.  Mild to moderate mitral regurgitation (regurgitant fraction 20%)  7.  Dilated ascending aorta measuring 43mm   Electronically Signed By: Lonni Nanas M.D. On: 10/04/2023 22:03         Physical Exam:    VS:  BP 118/84   Pulse 88   Ht 5' 5 (1.651 m)   Wt 175 lb (79.4 kg)   BMI 29.12 kg/m    Wt Readings from Last 3 Encounters:  12/13/23 175 lb (79.4 kg)   11/24/23 170 lb (77.1 kg)  11/16/23 176 lb 9.6 oz (80.1 kg)    Gen: no distress   Neck: No JVD Cardiac: No Rubs or Gallops, harsh holosystolic murmur, RRR, +2 radial pulses Respiratory: Clear to auscultation bilaterally, normal effort, normal  respiratory rate GI: Soft, nontender, non-distended  MS: No  edema;  moves all extremities Integument: Small patch red macules on arms Neuro:  At time of evaluation, alert and oriented to person/place/time/situation  Psych: anxious affect, patient feels poorly   ASSESSMENT AND PLAN: .     Hypertrophic Obstructive Cardiomyopathy Chronic condition with symptoms of fatigue, dyspnea, and lightheadedness. Intolerance to multiple medications. Recent switch from metoprolol  to diltiazem  caused severe side effects. Patient prefers metoprolol  despite fatigue. Discussed metoprolol , diltiazem , mavacamten, and procedural options (alcohol septal ablation, open heart surgery). Patient prefers to avoid surgery and new medications due to side effects. Explained alcohol septal ablation and mavacamten, emphasizing risks and benefits. - Restart 25 mg po at night in addition to her diltiazem  - Consider mavacamten if symptoms persist - Discussed alcohol septal ablation if symptoms persist; I think this is her best change at improved HCM symptoms - She has so many symptoms and so many reactions to medicines I have shared my concern that she will not be symptom free; We will do our best to support her with medications that cause minimal symptoms   Atrial Fibrillation Chronic condition requiring  anticoagulation to prevent stroke. Currently on Eliquis  with side effects (shaking, petechiae). Discussed stroke risks without anticoagulation and potential switch to Xarelto if side effects persist. - Continue Eliquis  - Consider switching to Xarelto if symptoms persist - CHADSVAC NA; I do not recommend stopping AC or LAA occlusion  Hypertension Well-controlled with current  medication regimen. Anxiety contributing to elevated blood pressure. Discussed importance of stress management.   Anxiety Anxiety may exacerbate symptoms.    Hyperlipidemia - not amenable to medication therapy at this time   Time Spent Directly with Patient:   I have spent a total of 46 minutes with the patient reviewing notes, imaging, EKGs, labs,  and examining the patient as well as establishing an assessment and plan that was discussed personally with the patient. Discussed disease state education, medication side effects, and ASA; given patient information on each SRT option.   Stanly Leavens, MD FASE [facility] Cardiologist [facility]  [address] [place], [address] [place], [place] [address] [phone]  5:31 PM

## 2023-12-22 ENCOUNTER — Telehealth: Payer: Self-pay | Admitting: Internal Medicine

## 2023-12-22 NOTE — Telephone Encounter (Signed)
Pt returning call to a nurse

## 2023-12-22 NOTE — Telephone Encounter (Signed)
Spoke with patient and she stated she would rather speak with Shamea. She is aware Alena Bills is out today and will return on Monday. She states she will wait until monday

## 2023-12-25 NOTE — Telephone Encounter (Signed)
Called pt to f/u concern.  Pt expresses would like to let MD know that she feels a lot better with the medication change he made.  No further concerns at this time.

## 2023-12-29 NOTE — Progress Notes (Signed)
 Remote pacemaker transmission.

## 2024-01-09 ENCOUNTER — Other Ambulatory Visit: Payer: Self-pay | Admitting: Cardiology

## 2024-01-09 DIAGNOSIS — I480 Paroxysmal atrial fibrillation: Secondary | ICD-10-CM

## 2024-01-09 NOTE — Telephone Encounter (Signed)
 Eliquis 5mg  refill request received. Patient is 83 years old, weight-79.4kg, Crea-0.84 on 11/16/23, Diagnosis-Afib, and last seen by Dr. Izora Ribas on 12/13/23. Dose is appropriate based on dosing criteria. Will send in refill to requested pharmacy.

## 2024-01-30 ENCOUNTER — Encounter: Payer: Medicare PPO | Admitting: Internal Medicine

## 2024-02-06 ENCOUNTER — Telehealth: Payer: Self-pay | Admitting: Internal Medicine

## 2024-02-06 ENCOUNTER — Encounter: Payer: Medicare PPO | Admitting: Internal Medicine

## 2024-02-06 DIAGNOSIS — I480 Paroxysmal atrial fibrillation: Secondary | ICD-10-CM

## 2024-02-06 MED ORDER — APIXABAN 5 MG PO TABS: 5.0000 mg | ORAL_TABLET | Freq: Two times a day (BID) | ORAL | Status: DC

## 2024-02-06 NOTE — Telephone Encounter (Signed)
 Will forward this message to our refill dept to further assist the pt with eliquis samples, while awaiting mail order delivery.

## 2024-02-06 NOTE — Telephone Encounter (Signed)
 Pt c/o medication issue:  1. Name of Medication: apixaban (ELIQUIS) 5 MG TABS tablet   2. How are you currently taking this medication (dosage and times per day)? 5 mg, 2 times daily   3. Are you having a reaction (difficulty breathing--STAT)? No  4. What is your medication issue? Pt is waiting on med from [facility] and she is ran out today, wanting to know if we have samples, requesting cb

## 2024-02-06 NOTE — Telephone Encounter (Signed)
 Called pt to inform her that we were leaving her 1 box of Eliquis 5 mg tablets samples at [facility] front desk for pt to pick up. Pt verbalized understanding.

## 2024-02-16 ENCOUNTER — Encounter: Payer: Medicare PPO | Admitting: Internal Medicine

## 2024-02-23 ENCOUNTER — Ambulatory Visit (INDEPENDENT_AMBULATORY_CARE_PROVIDER_SITE_OTHER): Payer: Medicare PPO

## 2024-02-23 DIAGNOSIS — I421 Obstructive hypertrophic cardiomyopathy: Secondary | ICD-10-CM

## 2024-02-25 LAB — CUP PACEART REMOTE DEVICE CHECK
Battery Remaining Longevity: 80 mo
Battery Remaining Percentage: 88 %
Battery Voltage: 2.98 V
Brady Statistic AP VP Percent: 68 %
Brady Statistic AP VS Percent: 1 %
Brady Statistic AS VP Percent: 31 %
Brady Statistic AS VS Percent: 1 %
Brady Statistic RA Percent Paced: 68 %
Brady Statistic RV Percent Paced: 99 %
Date Time Interrogation Session: 20250418051114
Implantable Lead Connection Status: 753985
Implantable Lead Connection Status: 753985
Implantable Lead Implant Date: 20140805
Implantable Lead Implant Date: 20140805
Implantable Lead Location: 753859
Implantable Lead Location: 753860
Implantable Lead Model: 1944
Implantable Lead Model: 1948
Implantable Pulse Generator Implant Date: 20240119
Lead Channel Impedance Value: 510 Ohm
Lead Channel Impedance Value: 540 Ohm
Lead Channel Pacing Threshold Amplitude: 0.25 V
Lead Channel Pacing Threshold Amplitude: 0.75 V
Lead Channel Pacing Threshold Pulse Width: 0.5 ms
Lead Channel Pacing Threshold Pulse Width: 0.8 ms
Lead Channel Sensing Intrinsic Amplitude: 12 mV
Lead Channel Sensing Intrinsic Amplitude: 2.1 mV
Lead Channel Setting Pacing Amplitude: 2.5 V
Lead Channel Setting Pacing Amplitude: 2.5 V
Lead Channel Setting Pacing Pulse Width: 0.5 ms
Lead Channel Setting Sensing Sensitivity: 4 mV
Pulse Gen Model: 2272
Pulse Gen Serial Number: 8145790

## 2024-03-11 NOTE — Progress Notes (Unsigned)
 Cardiology Office Note:    Date:  03/12/2024  ID:  Alexandra Wheeler 03/12/1941, MRN [id number] PCP: Vangie Genet, MD  [facility] Providers Cardiologist:  Jann Melody, MD Cardiology APP:  Gabino Joe, PA-C  Electrophysiologist:  Lei Pump, MD       Patient Profile:      Paroxysmal atrial fibrillation Apixaban  started in 10/2018 (AFib clinic)  Apixaban  DC'd in 08/2019: 0% AF on PPM interrogation  CHB, s/p pacemaker in 06/2013 Hypertension  Hyperlipidemia  LBBB GERD Coronary artery disease  Myoview  1/14: low risk Cath 8/14: Minimal plaque MPI 11/05/20: no ischemia  Hypertrophic CM Echocardiogram in 2014: asymmetric septal hypertrophy, MV SAM, mild MR, no sig LVOT at rest; LVOT 15 mmHg) beta-blocker initiated in 2014 No MRI due to PPM ETT 11/16: no high risk features  TTE 5/18: EF 60-65, Gr 1 DD, findings c/w HCM TTE  2/22: EF 60-65, no RWMA, severe asymmetric septal LVH, no LVOT, Gr 1 DD, mild SAM, mod MR, mild LAE, mod to severe TR, Asc Ao 42 mm, normal RVSF, RVSP 28  TTE 05/19/22: EF 60-65, mod ASH, mild MR, trivial AI, asc aorta 45 mm  CMR 10/03/23: Asymmetric LVH 15 mm (basal septum), post wall 8 mm, patchy LGE in basal septum and RV insertion site c/w HCM, EF 56, mod TR, mild to mod MR, asc aorta 43 mm TTE 11/13/23: EF 65-70, n oRWMA, severe ASH 18 mm, Gr 1 DD, NL RVSF, NL PASP, RVSP 33, mild LAE, mild to mod MR, mild to mod TR, mild AI; no LVOT at rest; mild SAM, no LVOT w Valsava, w repetitive squat/stand LVOT max gradient 52 mmHg, SAM Thoracic aortic aneurysm  CT 12/2022: Asc aorta 4.2 cm CMR 09/2023: 43 mm         Discussed the use of AI scribe software for clinical note transcription with the patient, who gave verbal consent to proceed.  History of Present Illness Alexandra Wheeler is a 83 y.o. female who returns for follow up of oHCM, AFib. She was last seen by Dr. Paulita Boss in 12/2023. She was having side effects to  Diltiazem  and Eliquis . Metoprolol  succinate 25 mg once daily was added back to her regimen in addition to Dilt. ETOH septal ablation and Mavacamten were discussed. Septal ablation is felt to be her best option for symptom management. She preferred to avoid any procedures at that time. It was decided to remain on Eliquis  with an option to change to Xarelto if symptoms persisted.   She is here alone.  She experiences palpitations, particularly after taking diltiazem . These palpitations occur about five hours after taking the medication, leading her to feel 'real bad' and causing her to lay down. She also notes feeling lightheaded and 'walking around in a fog' since starting the medication. No fainting or passing out has occurred.  She is short of breath.  She mainly relates this to the palpitations.  Otherwise, she can walk up a flight of steps without difficulty.   Her blood pressure has been controlled with diltiazem , typically around 120/80, but she reports an episode where it was 110/50, causing her to feel lightheaded.  She never started metoprolol  after her last visit.  She has been hesitant to take metoprolol  due to concerns about her blood pressure dropping too low.  She reports dark stools since starting diltiazem , which have been persistent. No chest pain is present, but she does report difficulty breathing when lying flat, requiring her  to elevate her bed.  Again, she mainly relates this to palpitations.  She has been able to manage stairs at home without chest pain.   ROS-See HPI    Studies Reviewed:        Results    Risk Assessment/Calculations:    CHA2DS2-VASc Score = 5   This indicates a 7.2% annual risk of stroke. The patient's score is based upon: CHF History: 0 HTN History: 1 Diabetes History: 0 Stroke History: 0 Vascular Disease History: 1 Age Score: 2 Gender Score: 1            Physical Exam:   VS:  BP 130/80   Pulse 66   Ht 5\' 5"  (1.651 m)   Wt 172 lb 9.6 oz (78.3  kg)   SpO2 97%   BMI 28.72 kg/m    Wt Readings from Last 3 Encounters:  03/12/24 172 lb 9.6 oz (78.3 kg)  12/13/23 175 lb (79.4 kg)  11/24/23 170 lb (77.1 kg)    Constitutional:      Appearance: Healthy appearance. Not in distress.  Neck:     Vascular: JVD normal.  Pulmonary:     Breath sounds: No wheezing. No rales.  Cardiovascular:     Normal rate. Regular rhythm.     Murmurs: There is a grade 2/6 systolic murmur at the URSB and ULSB.  Edema:    Peripheral edema absent.  Abdominal:     Palpations: Abdomen is soft.        Assessment and Plan:   Assessment & Plan HOCM (hypertrophic obstructive cardiomyopathy) (HCC) Echocardiogram in January 2025 with severe asymmetric septal hypertrophy with mild systolic anterior motion (SAM) and no left ventricular outflow tract (LVOT) obstruction at rest. LVOT max gradient of 52 with repetitive squatting and standing.  She mainly notices palpitations that seem to have gotten worse since starting on diltiazem .  She was afraid to take metoprolol .  I suspect she will have symptom improvement with taking metoprolol  to help some of her palpitations.  Her blood pressure can certainly tolerate the addition of metoprolol  succinate 25 mg daily.  I reassured her that her heart rate will remain stable with having a pacemaker. - Start metoprolol  succinate 25 mg daily. - Continue Cardizem  CD 360 mg daily. - Follow up in three months to assess symptom control and adjust dosages if necessary. Paroxysmal atrial fibrillation (HCC) Maintaining sinus rhythm.  She does note increased palpitations after starting diltiazem .  She has had some dark stools recently. - Continue Eliquis  5 mg twice daily. - Start metoprolol  succinate 25 mg daily to help control palpitations. - BMET, CBC today Essential hypertension Blood pressure is well-controlled with current regimen.  Her blood pressure can tolerate the addition of metoprolol  succinate - Continue Cardizem  CD 360 mg  daily. - Start metoprolol  succinate 25 mg daily.       Dispo:  Return in about 3 months (around 06/12/2024) for Routine Follow Up, w/ Marlyse Single, PA-C.  Signed, Marlyse Single, PA-C

## 2024-03-12 ENCOUNTER — Encounter: Payer: Self-pay | Admitting: Physician Assistant

## 2024-03-12 ENCOUNTER — Ambulatory Visit: Payer: Medicare PPO | Attending: Physician Assistant | Admitting: Physician Assistant

## 2024-03-12 VITALS — BP 130/80 | HR 66 | Ht 65.0 in | Wt 172.6 lb

## 2024-03-12 DIAGNOSIS — I10 Essential (primary) hypertension: Secondary | ICD-10-CM

## 2024-03-12 DIAGNOSIS — I480 Paroxysmal atrial fibrillation: Secondary | ICD-10-CM

## 2024-03-12 DIAGNOSIS — I421 Obstructive hypertrophic cardiomyopathy: Secondary | ICD-10-CM

## 2024-03-12 MED ORDER — METOPROLOL SUCCINATE ER 25 MG PO TB24: 25.0000 mg | ORAL_TABLET | Freq: Every day | ORAL | Status: DC

## 2024-03-12 NOTE — Assessment & Plan Note (Signed)
 Maintaining sinus rhythm.  She does note increased palpitations after starting diltiazem .  She has had some dark stools recently. - Continue Eliquis  5 mg twice daily. - Start metoprolol  succinate 25 mg daily to help control palpitations. - BMET, CBC today

## 2024-03-12 NOTE — Assessment & Plan Note (Signed)
 Echocardiogram in January 2025 with severe asymmetric septal hypertrophy with mild systolic anterior motion (SAM) and no left ventricular outflow tract (LVOT) obstruction at rest. LVOT max gradient of 52 with repetitive squatting and standing.  She mainly notices palpitations that seem to have gotten worse since starting on diltiazem .  She was afraid to take metoprolol .  I suspect she will have symptom improvement with taking metoprolol  to help some of her palpitations.  Her blood pressure can certainly tolerate the addition of metoprolol  succinate 25 mg daily.  I reassured her that her heart rate will remain stable with having a pacemaker. - Start metoprolol  succinate 25 mg daily. - Continue Cardizem  CD 360 mg daily. - Follow up in three months to assess symptom control and adjust dosages if necessary.

## 2024-03-12 NOTE — Assessment & Plan Note (Signed)
 Blood pressure is well-controlled with current regimen.  Her blood pressure can tolerate the addition of metoprolol  succinate - Continue Cardizem  CD 360 mg daily. - Start metoprolol  succinate 25 mg daily.

## 2024-03-12 NOTE — Patient Instructions (Signed)
 Medication Instructions:  Your physician has recommended you make the following change in your medication:  START Toprol  XL 25 taking 1 daily  *If you need a refill on your cardiac medications before your next appointment, please call your pharmacy*  Lab Work: TODAY:  BMET & CBC  If you have labs (blood work) drawn today and your tests are completely normal, you will receive your results only by: MyChart Message (if you have MyChart) OR A paper copy in the mail If you have any lab test that is abnormal or we need to change your treatment, we will call you to review the results.  Testing/Procedures: None ordered  Follow-Up: At [facility], you and your health needs are our priority.  As part of our continuing mission to provide you with exceptional heart care, our providers are all part of one team.  This team includes your primary Cardiologist (physician) and Advanced Practice Providers or APPs (Physician Assistants and Nurse Practitioners) who all work together to provide you with the care you need, when you need it.  Your next appointment:   3 month(s)  Provider:   Marlyse Single, PA-C          We recommend signing up for the patient portal called "MyChart".  Sign up information is provided on this After Visit Summary.  MyChart is used to connect with patients for Virtual Visits (Telemedicine).  Patients are able to view lab/test results, encounter notes, upcoming appointments, etc.  Non-urgent messages can be sent to your provider as well.   To learn more about what you can do with MyChart, go to [email or web address].   Other Instructions

## 2024-03-13 LAB — CBC
Hematocrit: 40.1 % (ref 34.0–46.6)
Hemoglobin: 13 g/dL (ref 11.1–15.9)
MCH: 28.4 pg (ref 26.6–33.0)
MCHC: 32.4 g/dL (ref 31.5–35.7)
MCV: 88 fL (ref 79–97)
Platelets: 195 10*3/uL (ref 150–450)
RBC: 4.57 x10E6/uL (ref 3.77–5.28)
RDW: 13.2 % (ref 11.7–15.4)
WBC: 4.3 10*3/uL (ref 3.4–10.8)

## 2024-03-13 LAB — BASIC METABOLIC PANEL WITH GFR
BUN/Creatinine Ratio: 23 (ref 12–28)
BUN: 16 mg/dL (ref 8–27)
CO2: 20 mmol/L (ref 20–29)
Calcium: 9.5 mg/dL (ref 8.7–10.3)
Chloride: 106 mmol/L (ref 96–106)
Creatinine, Ser: 0.71 mg/dL (ref 0.57–1.00)
Glucose: 93 mg/dL (ref 70–99)
Potassium: 4.5 mmol/L (ref 3.5–5.2)
Sodium: 145 mmol/L — ABNORMAL HIGH (ref 134–144)
eGFR: 84 mL/min/{1.73_m2} (ref >59)

## 2024-03-19 ENCOUNTER — Ambulatory Visit (HOSPITAL_BASED_OUTPATIENT_CLINIC_OR_DEPARTMENT_OTHER): Payer: Self-pay | Admitting: *Deleted

## 2024-04-03 NOTE — Progress Notes (Signed)
 Remote pacemaker transmission.

## 2024-04-03 NOTE — Addendum Note (Signed)
 Addended by: Lott Rouleau A on: 04/03/2024 09:22 AM   Modules accepted: Orders

## 2024-04-22 ENCOUNTER — Encounter: Payer: Self-pay | Admitting: Family Medicine

## 2024-04-22 ENCOUNTER — Ambulatory Visit (INDEPENDENT_AMBULATORY_CARE_PROVIDER_SITE_OTHER): Admitting: Family Medicine

## 2024-04-22 VITALS — BP 106/68 | HR 51 | Temp 97.9°F | Ht 65.0 in | Wt 173.2 lb

## 2024-04-22 DIAGNOSIS — K219 Gastro-esophageal reflux disease without esophagitis: Secondary | ICD-10-CM

## 2024-04-22 DIAGNOSIS — I442 Atrioventricular block, complete: Secondary | ICD-10-CM

## 2024-04-22 DIAGNOSIS — L578 Other skin changes due to chronic exposure to nonionizing radiation: Secondary | ICD-10-CM | POA: Diagnosis not present

## 2024-04-22 DIAGNOSIS — R1032 Left lower quadrant pain: Secondary | ICD-10-CM

## 2024-04-22 DIAGNOSIS — I872 Venous insufficiency (chronic) (peripheral): Secondary | ICD-10-CM | POA: Diagnosis not present

## 2024-04-22 DIAGNOSIS — F411 Generalized anxiety disorder: Secondary | ICD-10-CM | POA: Diagnosis not present

## 2024-04-22 DIAGNOSIS — I421 Obstructive hypertrophic cardiomyopathy: Secondary | ICD-10-CM

## 2024-04-22 DIAGNOSIS — I480 Paroxysmal atrial fibrillation: Secondary | ICD-10-CM

## 2024-04-22 MED ORDER — BUSPIRONE HCL 7.5 MG PO TABS: 7.5000 mg | ORAL_TABLET | Freq: Two times a day (BID) | ORAL | 0 refills | Status: AC

## 2024-04-22 MED ORDER — TRETINOIN (EMOLLIENT) 0.05 % EX CREA: 1.0000 | TOPICAL_CREAM | Freq: Every evening | CUTANEOUS | 3 refills | Status: DC

## 2024-04-22 MED ORDER — PANTOPRAZOLE SODIUM 40 MG PO TBEC
DELAYED_RELEASE_TABLET | ORAL | 3 refills | Status: DC
Start: 2024-04-22 — End: 2024-08-05

## 2024-04-22 NOTE — Patient Instructions (Addendum)
  VISIT SUMMARY: During your visit, we discussed your concerns about medication side effects and memory issues. We reviewed your history of atrial fibrillation, anxiety, and other health conditions. We made some changes to your medications and provided recommendations to help manage your symptoms and improve your overall well-being.  YOUR PLAN: -ANXIETY: Anxiety is a condition characterized by feelings of worry and fear. We discussed that hydroxyzine , which you were taking for anxiety, causes sedation and mental fogginess. We decided to start you on buspirone  7.5 mg twice daily, which is generally well-tolerated and has fewer sedative effects. You should discontinue hydroxyzine . Although you prefer not to engage in therapy currently, it remains an option for managing anxiety without medication.  -ATRIAL FIBRILLATION: Atrial fibrillation is an irregular and often rapid heart rate. Your condition is managed with diltiazem  and metoprolol , and your heart rate is stable at 51 bpm with your pacemaker. Continue taking your heart medications as prescribed, and consult your cardiologist before making any changes.  -HYPERTROPHIC OBSTRUCTIVE CARDIOMYOPATHY: Hypertrophic obstructive cardiomyopathy is a condition where the heart muscle becomes abnormally thick. This is managed with your pacemaker, and there are no acute issues at this time. Continue with your current management plan.  -VENOUS INSUFFICIENCY: Venous insufficiency is a condition where the veins have trouble sending blood from the legs back to the heart, causing ankle swelling. We recommend non-pharmacological management including leg elevation, monitoring your salt intake, and considering the use of compression stockings.  -GASTROINTESTINAL DISCOMFORT: You have intermittent abdominal discomfort, possibly related to bowel habits. Continue using MiraLax as needed for regular bowel movements. Consider adding probiotics to your diet and increase your fiber  and water intake to improve gut health.  -PHOTOAGING: Photoaging refers to skin damage caused by prolonged exposure to sunlight. We discussed the risks of increased sunburn and skin dryness with higher strength tretinoin . We prescribed tretinoin  0.05% with an emollient for nightly use and advised you to use non-comedogenic sunscreen and moisturizer.  INSTRUCTIONS: Please follow up with your cardiologist before making any changes to your heart medications. Continue with the new medication regimen and lifestyle recommendations as discussed. If you have any new symptoms or concerns, please contact our office.

## 2024-04-22 NOTE — Progress Notes (Signed)
 Assessment & Plan   Assessment/Plan:    Assessment & Plan Anxiety Chronic anxiety with recent exacerbation. Hydroxyzine  causes sedation and mental fogginess. Discussed benzodiazepine risks, including increased dementia and fall risk, especially in older adults. Explored buspirone  as an alternative, which is well-tolerated with fewer sedative effects. Discussed buspirone  side effects, including dry mouth and mild sedation. She prefers not to engage in therapy currently. - Start buspirone  7.5 mg twice daily - Discontinue hydroxyzine  - Consider therapy as a non-pharmacological option for anxiety management  Atrial Fibrillation Atrial fibrillation managed with diltiazem  and metoprolol . Metoprolol  recently added for palpitations. Heart rate stable at 51 bpm with pacemaker in place. No changes to heart medications without cardiologist approval. - Continue diltiazem  and metoprolol  as prescribed - Consult cardiologist before making any changes to heart medications  Hypertrophic Obstructive Cardiomyopathy Hypertrophic obstructive cardiomyopathy with complete AV block managed with pacemaker. No acute issues. - Continue current management with pacemaker  Venous Insufficiency Venous insufficiency with ankle swelling. Discussed non-pharmacological management including leg elevation, monitoring salt intake, and compression stockings. - Recommend leg elevation - Monitor salt intake - Consider use of compression stockings  Gastrointestinal Discomfort Intermittent abdominal discomfort, possibly related to bowel habits. Regular bowel movements with MiraLax. Discussed potential benefits of probiotics for gut health. - Continue MiraLax as needed - Consider adding probiotics to diet - Increase fiber and water intake  Photoaging Photoaging managed with tretinoin . Discussed risks of increased sunburn and skin dryness with higher strength tretinoin . - Prescribe tretinoin  0.05% with emollient for nightly  use - Advise use of non-comedogenic sunscreen and moisturizer      Medications Discontinued During This Encounter  Medication Reason   tretinoin  (RETIN-A ) 0.025 % cream    pantoprazole  (PROTONIX ) 40 MG tablet Reorder    Return in about 2 weeks (around 05/06/2024) for anxiety.        Subjective:   Encounter date: 04/22/2024  GERALDY AKRIDGE is a 83 y.o. female who has Osteoporosis; DI (detrusor instability); Atrophic vaginitis; LBBB (left bundle branch block); CHB (complete heart block) (HCC); Vitamin D  deficiency; Abnormal glucose; Essential hypertension; Hyperlipidemia, mixed; Overweight (BMI 25.0-29.9); HOCM (hypertrophic obstructive cardiomyopathy) (HCC); GERD (gastroesophageal reflux disease); Major depressive disorder, recurrent episode, mild with anxious distress (HCC); Pacemaker; Paroxysmal atrial fibrillation (HCC); Chronic anticoagulation; Familial tremor; Episodic tension-type headache, not intractable; Cervical spinal meningocele (HCC); Hypertrophic cardiomyopathy (HCC); Thoracic aortic aneurysm (HCC) by Chest CTA 12/2020; Former smoker; FHx: heart disease; Tenderness of both temporomandibular joints; Ascending aorta dilatation (HCC); Tricuspid regurgitation; and Mitral regurgitation on their problem list..   She  has a past medical history of Anxiety, Ascending aorta dilatation (HCC), Atrophic vaginitis, Chest pain, Colon polyps, Complete heart block (HCC) (06/2013), Depression, DI (detrusor instability), GERD (gastroesophageal reflux disease), H/O echocardiogram, HOCM (hypertrophic obstructive cardiomyopathy) (HCC), HOCM (hypertrophic obstructive cardiomyopathy) (HCC), HTN (hypertension), Hyperlipidemia, LBBB (left bundle branch block), Leukopenia (05/19/2014), Lymphocytosis (05/19/2014), Migraines, Mitral regurgitation, Myoview , Osteoporosis, Paroxysmal atrial fibrillation (HCC), Prediabetes, Thoracic aortic aneurysm (HCC), Tricuspid regurgitation, and Vitamin D  deficiency..    She presents with chief complaint of Establish Care and Medication Refill .   Discussed the use of AI scribe software for clinical note transcription with the patient, who gave verbal consent to proceed.  History of Present Illness COURTNI BALASH is an 83 year old female with atrial fibrillation and anxiety who presents with concerns about medication side effects and memory issues.  She has a history of atrial fibrillation, complete AV block, and hypertrophic obstructive cardiomyopathy. She has been experiencing palpitations  and was recently prescribed metoprolol  25 mg daily and Cardizem  CD 360 mg for rate control. She has a pacemaker in place. She is concerned about mental confusion and fogginess since starting these medications.  She has a history of anxiety and was previously managed on Xanax , which was discontinued a year ago. She was started on hydroxyzine  for anxiety, which she takes half a tablet at night. Hydroxyzine  causes her to feel groggy and mentally foggy, impacting her daily functioning. She has not taken Xanax  for over a year and is concerned about the side effects of her current anxiety medication.  She experiences a tremor, which she associates with her anxiety and medication side effects. She has a history of memory concerns, including forgetfulness of names, which she attributes to aging but feels may be exacerbated by her current medications.  She takes pantoprazole  40 mg as needed for heartburn and uses tretinoin  0.025% as needed for facial wrinkles. She uses Miralax for bowel regularity and experiences occasional left-sided abdominal pain, particularly when lying down. She also notes ankle swelling, which she attributes to venous insufficiency.  She has been living in [place] since [place] and currently resides near [place]. She is concerned about the distance to the medical office and the potential need to transfer care to a closer location in the  future.     ROS  Past Surgical History:  Procedure Laterality Date   BREAST BIOPSY Left 07/25/2019   ANGIOLIPOMA   BREAST EXCISIONAL BIOPSY Right    BREAST SURGERY     Breast cyst   CATARACT EXTRACTION Left 2019   Dr. Candi Chafe   HEMORRHOID SURGERY     LEFT HEART CATHETERIZATION WITH CORONARY ANGIOGRAM N/A 06/11/2013   Procedure: LEFT HEART CATHETERIZATION WITH CORONARY ANGIOGRAM;  Surgeon: Odie Benne, MD;  Location: [facility] CATH LAB;  Service: Cardiovascular;  Laterality: N/A;   PACEMAKER INSERTION  06-11-2013   STJ dual chamber pacemaker implanted by Dr Nunzio Belch for complete heart block   PERMANENT PACEMAKER INSERTION Left 06/11/2013   Procedure: PERMANENT PACEMAKER INSERTION;  Surgeon: Odie Benne, MD;  Location: [facility] CATH LAB;  Service: Cardiovascular;  Laterality: Left;   PERMANENT PACEMAKER INSERTION N/A 06/11/2013   Procedure: PERMANENT PACEMAKER INSERTION;  Surgeon: Jolly Needle, MD;  Location: [facility] CATH LAB;  Service: Cardiovascular;  Laterality: N/A;   PPM GENERATOR CHANGEOUT N/A 11/25/2022   Procedure: PPM GENERATOR CHANGEOUT;  Surgeon: Lei Pump, MD;  Location: [facility] INVASIVE CV LAB;  Service: Cardiovascular;  Laterality: N/A;   TEMPORARY PACEMAKER INSERTION N/A 06/11/2013   Procedure: TEMPORARY PACEMAKER INSERTION;  Surgeon: Odie Benne, MD;  Location[facility] C[facility] LAB;  Service: Cardiovascular;  Laterality: N/A;   TUBAL LIGATION      Outpatient Medications Prior to Visit  Medication Sig Dispense Refill   acetaminophen  (TYLENOL ) 500 MG tablet Take 500 mg by mouth as needed.     apixaban  (ELIQUIS ) 5 MG TABS tablet Take 1 tablet (5 mg total) by mouth 2 (two) times daily. 14 tablet    Ascorbic Acid  (VITAMIN C  PO) Take 1 tablet by mouth daily.      CALCIUM  PO Take 1 tablet by mouth daily.     Cholecalciferol  (VITAMIN D  PO) Take 2 capsules by mouth daily.     diltiazem  (CARDIZEM  CD) 360 MG 24 hr capsule Take 1 capsule (360 mg total) by mouth daily. 90 capsule  3   FOLIC ACID  PO Take 1 tablet by mouth daily.      MAGNESIUM  PO  Take 1 tablet by mouth daily.     metoprolol  succinate (TOPROL  XL) 25 MG 24 hr tablet Take 1 tablet (25 mg total) by mouth daily.     Multiple Vitamin (MULTIVITAMIN) tablet Take 1 tablet by mouth daily.     Multiple Vitamins-Minerals (ZINC PO) Take by mouth. daily     pantoprazole  (PROTONIX ) 40 MG tablet Take 1 tablet Daily with Supper for Indigestion & Acid Reflux 90 tablet 3   tretinoin  (RETIN-A ) 0.025 % cream Please specify directions, refills and quantity 45 g 1   Alcohol Swabs  (B-D SINGLE USE SWABS  REGULAR) PADS Use as directed for Glucose monitoring Daily 100 each 3   No facility-administered medications prior to visit.    Family History  Problem Relation Age of Onset   Diabetes Mother    Heart disease Mother    Diabetes Brother    Hypertension Brother    Heart disease Brother    Breast cancer Sister 27   Aneurysm Brother    Stroke Brother    Cancer Father        prostate   Diabetes Brother    Heart disease Brother     Social History   Socioeconomic History   Marital status: Widowed    Spouse name: Not on file   Number of children: Not on file   Years of education: Not on file   Highest education level: Not on file  Occupational History   Occupation: Part-time at [employer]  Tobacco Use   Smoking status: Former    Current packs/day: 0.00    Types: Cigarettes    Quit date: 10/09/1975    Years since quitting: 48.5   Smokeless tobacco: Never  Vaping Use   Vaping status: Never Used  Substance and Sexual Activity   Alcohol use: No   Drug use: No   Sexual activity: Never    Birth control/protection: Surgical, Post-menopausal  Other Topics Concern   Not on file  Social History Narrative   Lives alone. Dtr in the area.   Social Drivers of Corporate investment banker Strain: Not on file  Food Insecurity: Not on file  Transportation Needs: Not on file  Physical Activity: Not on file  Stress: Not  on file  Social Connections: Not on file  Intimate Partner Violence: Not on file                                                                                                  Objective:  Physical Exam: BP 106/68   Pulse (!) 51   Temp 97.9 F (36.6 C) (Temporal)   Ht 5' 5 (1.651 m)   Wt 173 lb 3.2 oz (78.6 kg)   SpO2 98%   BMI 28.82 kg/m    Physical Exam  GENERAL: Alert, cooperative, well developed, no acute distress. HEENT: Normocephalic, normal oropharynx, moist mucous membranes. CHEST: Clear to auscultation bilaterally. No wheezes, rhonchi, or crackles. CARDIOVASCULAR: Heart murmur present, stable. Irregular rhythm ABDOMEN: Soft, non-tender, non-distended, without organomegaly. Normal bowel sounds. EXTREMITIES: Varicose veins, 1+ bilateral peripheral edema, no ulcers NEUROLOGICAL: Cranial nerves grossly intact.  Moves all extremities without gross motor or sensory deficit. SKIN: Photoaging and wrinkles on face   Physical Exam  CUP PACEART REMOTE DEVICE CHECK Result Date: 02/25/2024 Scheduled remote reviewed. Normal device function.  Presenting rhythm:  AP-VP. Next remote 91 days. - CS, CVRS   Recent Results (from the past 2160 hours)  CUP PACEART REMOTE DEVICE CHECK     Status: None   Collection Time: 02/23/24  5:11 AM  Result Value Ref Range   Date Time Interrogation Session [id number]    Pulse Generator Manufacturer [employer]    Pulse Gen Model 2272 Assurity MRI    Pulse Gen Serial Number [id number]    Clinic Name [facility]    Implantable Pulse Generator Type Implantable Pulse Generator    Implantable Pulse Generator Implant Date 95621308    Implantable Lead Manufacturer [facility]    Implantable Lead Model 1944 IsoFlex Optim    Implantable Lead Serial Number [id number]    Implantable Lead Implant Date 65784696    Implantable Lead Location [id number]    Implantable Lead Connection Status [id number]    Implantable Lead Manufacturer [facility]    Implantable Lead Model 1948  IsoFlex Optim    Implantable Lead Serial Number [id number]    Implantable Lead Implant Date 29528413    Implantable Lead Location [id number]    Implantable Lead Connection Status [id number]    Lead Channel Setting Sensing Sensitivity 4.0 mV   Lead Channel Setting Sensing Adaptation Mode Fixed Pacing    Lead Channel Setting Pacing Amplitude 2.5 V   Lead Channel Setting Pacing Pulse Width 0.5 ms   Lead Channel Setting Pacing Amplitude 2.5 V   Lead Channel Status NULL    Lead Channel Impedance Value 510 ohm   Lead Channel Sensing Intrinsic Amplitude 2.1 mV   Lead Channel Pacing Threshold Amplitude 0.25 V   Lead Channel Pacing Threshold Pulse Width 0.8 ms   Lead Channel Status NULL    Lead Channel Impedance Value 540 ohm   Lead Channel Sensing Intrinsic Amplitude 12.0 mV   Lead Channel Pacing Threshold Amplitude 0.75 V   Lead Channel Pacing Threshold Pulse Width 0.5 ms   Battery Status MOS    Battery Remaining Longevity 80 mo   Battery Remaining Percentage 88.0 %   Battery Voltage 2.98 V   Brady Statistic RA Percent Paced 68.0 %   Brady Statistic RV Percent Paced 99.0 %   Brady Statistic AP VP Percent 68.0 %   Brady Statistic AS VP Percent 31.0 %   Brady Statistic AP VS Percent 1.0 %   Brady Statistic AS VS Percent 1.0 %  Basic Metabolic Panel (BMET)     Status: Abnormal   Collection Time: 03/12/24 12:16 PM  Result Value Ref Range   Glucose 93 70 - 99 mg/dL   BUN 16 8 - 27 mg/dL   Creatinine, Ser 2.44 0.57 - 1.00 mg/dL   eGFR 84 >01 UU/VOZ/3.66   BUN/Creatinine Ratio 23 12 - 28   Sodium 145 (H) 134 - 144 mmol/L   Potassium 4.5 3.5 - 5.2 mmol/L   Chloride 106 96 - 106 mmol/L   CO2 20 20 - 29 mmol/L   Calcium  9.5 8.7 - 10.3 mg/dL  CBC     Status: None   Collection Time: 03/12/24 12:16 PM  Result Value Ref Range   WBC 4.3 3.4 - 10.8 x10E3/uL   RBC 4.57 3.77 - 5.28 x10E6/uL   Hemoglobin 13.0 11.1 - 15.9 g/dL   Hematocrit 44.0 34.7 -  46.6 %   MCV 88 79 - 97 fL   MCH 28.4 26.6 - 33.0  pg   MCHC 32.4 31.5 - 35.7 g/dL   RDW 78.2 95.6 - 21.3 %   Platelets 195 150 - 450 x10E3/uL        Carnell Christian, MD, MS

## 2024-04-25 ENCOUNTER — Ambulatory Visit: Payer: Medicare PPO | Admitting: Nurse Practitioner

## 2024-04-25 ENCOUNTER — Telehealth: Payer: Self-pay

## 2024-04-25 NOTE — Telephone Encounter (Signed)
 Copied from CRM [id number]. Topic: Clinical - Prescription Issue >> Apr 24, 2024  3:20 PM Clyde Darling P wrote: Reason for CRM: Pt request medications (pantoprazole  (PROTONIX ) 40 MG tablet, Tretinoin , Facial Wrinkles, (TRETINOIN , EMOLLIENT,) 0.05 % CREA,  busPIRone  (BUSPAR ) 7.5 MG tablet   to be sent to [id number] - [place], [place] - [address] Pt would like to be contacted once prescriptions has been sent [phone]

## 2024-04-25 NOTE — Telephone Encounter (Signed)
 Called patient and advised that medication was sent to the mail order on 04/21/24.   She will call them to see if they ar being processed.  If any issues she will call us  back. Dm/cma

## 2024-04-26 ENCOUNTER — Other Ambulatory Visit: Payer: Self-pay | Admitting: Family Medicine

## 2024-04-26 DIAGNOSIS — L578 Other skin changes due to chronic exposure to nonionizing radiation: Secondary | ICD-10-CM

## 2024-04-26 MED ORDER — TRETINOIN (EMOLLIENT) 0.05 % EX CREA: 1.0000 | TOPICAL_CREAM | Freq: Every evening | CUTANEOUS | 3 refills | Status: DC

## 2024-04-26 NOTE — Telephone Encounter (Signed)
 Copied from CRM [id number]. Topic: Clinical - Medication Refill >> Apr 26, 2024 11:02 AM Melissa C wrote: Medication: Tretinoin , Facial Wrinkles, (TRETINOIN , EMOLLIENT,) 0.05 % CREA   Has the patient contacted their pharmacy? Yes (Agent: If no, request that the patient contact the pharmacy for the refill. If patient does not wish to contact the pharmacy document the reason why and proceed with request.) (Agent: If yes, when and what did the pharmacy advise?)  This is the patient's preferred pharmacy:    [phone] [place] - [address] [place] [address] Phone: [phone] Fax: [phone]  Is this the correct pharmacy for this prescription? Yes If no, delete pharmacy and type the correct one.   Has the prescription been filled recently? Yes  Is the patient out of the medication? No  Has the patient been seen for an appointment in the last year OR does the patient have an upcoming appointment? Yes  Can we respond through MyChart? No  Agent: Please be advised that Rx refills may take up to 3 business days. We ask that you follow-up with your pharmacy.  NOTE: prescription has been filled recently because previous prescription refill request was sent with 2 medications. One medication was sent to preferred pharmacy but this one was not, please re-send this medication to preferred pharmacy above.

## 2024-05-02 ENCOUNTER — Other Ambulatory Visit: Payer: Self-pay | Admitting: Internal Medicine

## 2024-05-02 DIAGNOSIS — I480 Paroxysmal atrial fibrillation: Secondary | ICD-10-CM

## 2024-05-03 NOTE — Telephone Encounter (Signed)
 Prescription refill request for Eliquis  received. Indication:AFIB Last office visit:5/25 Scr:0.71  5/25 Age: 83 Weight:78.6  kg  Prescription refilled

## 2024-05-05 ENCOUNTER — Other Ambulatory Visit: Payer: Self-pay | Admitting: Family Medicine

## 2024-05-05 DIAGNOSIS — L578 Other skin changes due to chronic exposure to nonionizing radiation: Secondary | ICD-10-CM

## 2024-05-06 ENCOUNTER — Encounter: Payer: Self-pay | Admitting: Family Medicine

## 2024-05-06 ENCOUNTER — Ambulatory Visit (INDEPENDENT_AMBULATORY_CARE_PROVIDER_SITE_OTHER): Admitting: Family Medicine

## 2024-05-06 VITALS — BP 112/74 | HR 55 | Temp 98.0°F | Ht 65.0 in | Wt 173.0 lb

## 2024-05-06 DIAGNOSIS — F419 Anxiety disorder, unspecified: Secondary | ICD-10-CM | POA: Insufficient documentation

## 2024-05-06 DIAGNOSIS — L578 Other skin changes due to chronic exposure to nonionizing radiation: Secondary | ICD-10-CM | POA: Insufficient documentation

## 2024-05-06 DIAGNOSIS — I480 Paroxysmal atrial fibrillation: Secondary | ICD-10-CM | POA: Diagnosis not present

## 2024-05-06 MED ORDER — TRETINOIN 0.05 % EX CREA
TOPICAL_CREAM | Freq: Every day | CUTANEOUS | 0 refills | Status: AC
Start: 2024-05-06 — End: ?

## 2024-05-06 NOTE — Patient Instructions (Signed)
  VISIT SUMMARY: During your visit, we discussed your chronic anxiety, issues with your heart medication, and a prescription refill problem. We reviewed your current medications and made a plan to address your concerns.  YOUR PLAN: -ATRIAL FIBRILLATION: Atrial fibrillation is an irregular and often rapid heart rate that can increase your risk of strokes, heart failure, and other heart-related complications. You are currently managing this with diltiazem , metoprolol , and Eliquis . Since you are experiencing palpitations and discomfort, particularly at night, we recommend consulting your cardiologist to discuss adjusting your diltiazem  dosage and evaluating your current heart medications.  -CHRONIC ANXIETY: Chronic anxiety is a long-term condition characterized by excessive, persistent worry and fear. You have been prescribed buspirone  7.5 mg twice daily but have not started it due to concerns about side effects and interactions with your current medications. You are currently taking hydroxyzine , which causes sedation. We discussed that buspirone  is less sedating and effective for anxiety, but you should consider starting it after your cardiology review and adjustment of heart medications.  -PRESCRIPTION REFILL ISSUE: You are having issues with filling your tretinoin  prescription due to the formulation with emollient. We recommend reverting to your previous tretinoin  formulation without the emollient.  INSTRUCTIONS: Please follow up with your cardiologist to discuss adjusting your diltiazem  dosage and evaluating your current heart medications. Consider starting buspirone  for your anxiety after your cardiology review. Revert to your previous tretinoin  formulation without the emollient for your prescription refill.

## 2024-05-06 NOTE — Progress Notes (Signed)
 Assessment & Plan   Assessment/Plan:   Assessment & Plan Atrial Fibrillation Atrial fibrillation managed with diltiazem , metoprolol , and Eliquis . She experiences palpitations and discomfort, particularly at night, potentially related to diltiazem . Plans to discuss medication adjustment with cardiologist to address these symptoms. - Consult cardiologist regarding adjustment of diltiazem  dosage - Evaluate current heart medications including diltiazem , metoprolol , and Eliquis   Chronic Anxiety Chronic anxiety with management challenges. Prescribed buspirone  7.5 mg BID but has not started it due to concerns about side effects and interactions with current medications. Currently taking hydroxyzine  but experiencing sedation. Hesitant to start new medication until heart medication issues are resolved. Buspirone  is less sedating and effective for anxiety, but decision to start is deferred until after cardiology review. - Continue hydroxyzine  for anxiety management - Discuss potential side effects of buspirone , including less sedation compared to hydroxyzine  - Consider starting buspirone  after cardiology review and adjustment of heart medications  Photoaging Issues with filling tretinoin  prescription due to formulation with emollient. Previous formulation was without emollient. - Revert to tretinoin  0.05% cream without emollient      Medications Discontinued During This Encounter  Medication Reason   Tretinoin , Facial Wrinkles, (TRETINOIN , EMOLLIENT,) 0.05 % CREA     Return if symptoms worsen or fail to improve.        Subjective:   Encounter date: 05/06/2024  [name] is a 83 y.o. female who has Osteoporosis; DI (detrusor instability); Atrophic vaginitis; LBBB (left bundle branch block); CHB (complete heart block) (HCC); Vitamin D  deficiency; Abnormal glucose; Essential hypertension; Hyperlipidemia, mixed; Overweight (BMI 25.0-29.9); HOCM (hypertrophic obstructive  cardiomyopathy) (HCC); GERD (gastroesophageal reflux disease); Major depressive disorder, recurrent episode, mild with anxious distress (HCC); Pacemaker; Paroxysmal atrial fibrillation (HCC); Chronic anticoagulation; Familial tremor; Episodic tension-type headache, not intractable; Cervical spinal meningocele (HCC); Hypertrophic cardiomyopathy (HCC); Thoracic aortic aneurysm (HCC) by Chest CTA 12/2020; Former smoker; FHx: heart disease; Tenderness of both temporomandibular joints; Ascending aorta dilatation (HCC); Tricuspid regurgitation; Mitral regurgitation; Anxiety; and Photoaging of skin on their problem list..   She  has a past medical history of Anxiety, Ascending aorta dilatation (HCC), Atrophic vaginitis, Chest pain, Colon polyps, Complete heart block (HCC) (06/2013), Depression, DI (detrusor instability), GERD (gastroesophageal reflux disease), H/O echocardiogram, HOCM (hypertrophic obstructive cardiomyopathy) (HCC), HOCM (hypertrophic obstructive cardiomyopathy) (HCC), HTN (hypertension), Hyperlipidemia, LBBB (left bundle branch block), Leukopenia (05/19/2014), Lymphocytosis (05/19/2014), Migraines, Mitral regurgitation, Myoview , Osteoporosis, Paroxysmal atrial fibrillation (HCC), Prediabetes, Thoracic aortic aneurysm (HCC), Tricuspid regurgitation, and Vitamin D  deficiency..   She presents with chief complaint of Follow-up (Anxiety; want to continue with the buspar  for now) and Medication Problem (Tretinion; pharmacy can't fill correct one) .   Discussed the use of AI scribe software for clinical note transcription with the patient, who gave verbal consent to proceed.  History of Present Illness [name] is an 83 year old female with chronic anxiety who presents for follow-up regarding her condition.  She has chronic anxiety and was recently prescribed buspirone  7.5 mg BID but has not started taking it due to concerns about potential side effects and interactions with her current  medications. She continues to take hydroxyzine  for anxiety but experiences sedation. She feels distracted and is hesitant to start a new medication until her heart medication issues are resolved.  She is experiencing issues with her heart medication, diltiazem , which she takes at night. She feels palpitations and discomfort several hours after taking it, particularly when lying down. She is also on metoprolol  and Eliquis  for atrial fibrillation and blood thinning,  respectively. She plans to discuss medication adjustment with her cardiologist.  There is an issue with filling her tretinoin  prescription, specifically the one with the emollient. She is considering returning to her previous formulation of tretinoin .  She might spend the upcoming Fourth of July with her daughter, indicating some family involvement in her life.      04/22/2024   10:51 AM  GAD 7 : Generalized Anxiety Score  Nervous, Anxious, on Edge 1  Control/stop worrying 1  Worry too much - different things 2  Trouble relaxing 1  Restless 1  Easily annoyed or irritable 1  Afraid - awful might happen 1  Total GAD 7 Score 8  Anxiety Difficulty Somewhat difficult       04/22/2024   10:50 AM 11/16/2023   12:13 PM 07/31/2023   11:35 PM 04/26/2023   11:40 AM 01/22/2023   11:03 PM  Depression screen PHQ 2/9  Decreased Interest 1 2 0 0 0  Down, Depressed, Hopeless 1 0 0 0 0  PHQ - 2 Score 2 2 0 0 0  Altered sleeping 1 1     Tired, decreased energy 1 3     Change in appetite 0 0     Feeling bad or failure about yourself  0 0     Trouble concentrating 1 2     Moving slowly or fidgety/restless 1 0     Suicidal thoughts 1 0     PHQ-9 Score 7 8     Difficult doing work/chores Somewhat difficult Very difficult        ROS  Past Surgical History:  Procedure Laterality Date   BREAST BIOPSY Left 07/25/2019   ANGIOLIPOMA   BREAST EXCISIONAL BIOPSY Right    BREAST SURGERY     Breast cyst   CATARACT EXTRACTION Left 2019   Dr.  Octavia   HEMORRHOID SURGERY     LEFT HEART CATHETERIZATION WITH CORONARY ANGIOGRAM N/A 06/11/2013   Procedure: LEFT HEART CATHETERIZATION WITH CORONARY ANGIOGRAM;  Surgeon: Lonni JONETTA Cash, MD;  Location: [facility] CATH LAB;  Service: Cardiovascular;  Laterality: N/A;   PACEMAKER INSERTION  06-11-2013   STJ dual chamber pacemaker implanted by Dr Kelsie for complete heart block   PERMANENT PACEMAKER INSERTION Left 06/11/2013   Procedure: PERMANENT PACEMAKER INSERTION;  Surgeon: Lonni JONETTA Cash, MD;  Location: [facility] CATH LAB;  Service: Cardiovascular;  Laterality: Left;   PERMANENT PACEMAKER INSERTION N/A 06/11/2013   Procedure: PERMANENT PACEMAKER INSERTION;  Surgeon: Lynwood Kelsie, MD;  Location: [facility] CATH LAB;  Service: Cardiovascular;  Laterality: N/A;   PPM GENERATOR CHANGEOUT N/A 11/25/2022   Procedure: PPM GENERATOR CHANGEOUT;  Surgeon: Inocencio Soyla Lunger, MD;  Location: [facility] INVASIVE CV LAB;  Service: Cardiovascular;  Laterality: N/A;   TEMPORARY PACEMAKER INSERTION N/A 06/11/2013   Procedure: TEMPORARY PACEMAKER INSERTION;  Surgeon: Lonni JONETTA Cash, MD;  Location: [facility] CATH LAB;  Service: Cardiovascular;  Laterality: N/A;   TUBAL LIGATION      Outpatient Medications Prior to Visit  Medication Sig Dispense Refill   acetaminophen  (TYLENOL ) 500 MG tablet Take 500 mg by mouth as needed.     Alcohol Swabs  (B-D SINGLE USE SWABS  REGULAR) PADS Use as directed for Glucose monitoring Daily 100 each 3   Ascorbic Acid  (VITAMIN C  PO) Take 1 tablet by mouth daily.      CALCIUM  PO Take 1 tablet by mouth daily.     Cholecalciferol  (VITAMIN D  PO) Take 2 capsules by mouth daily.     diltiazem  (  CARDIZEM  CD) 360 MG 24 hr capsule Take 1 capsule (360 mg total) by mouth daily. 90 capsule 3   ELIQUIS  5 MG TABS tablet TAKE 1 TABLET TWICE DAILY 180 tablet 3   FOLIC ACID  PO Take 1 tablet by mouth daily.      MAGNESIUM  PO Take 1 tablet by mouth daily.     metoprolol  succinate (TOPROL  XL) 25 MG 24 hr tablet Take 1  tablet (25 mg total) by mouth daily.     Multiple Vitamin (MULTIVITAMIN) tablet Take 1 tablet by mouth daily.     Multiple Vitamins-Minerals (ZINC PO) Take by mouth. daily     pantoprazole  (PROTONIX ) 40 MG tablet Take 1 tablet Daily with Supper for Indigestion & Acid Reflux 90 tablet 3   busPIRone  (BUSPAR ) 7.5 MG tablet Take 1 tablet (7.5 mg total) by mouth 2 (two) times daily. (Patient not taking: Reported on 05/06/2024) 60 tablet 0   Tretinoin , Facial Wrinkles, (TRETINOIN , EMOLLIENT,) 0.05 % CREA Apply 1 Application topically at bedtime. (Patient not taking: Reported on 05/06/2024) 40 g 3   No facility-administered medications prior to visit.    Family History  Problem Relation Age of Onset   Diabetes Mother    Heart disease Mother    Diabetes Brother    Hypertension Brother    Heart disease Brother    Breast cancer Sister 52   Aneurysm Brother    Stroke Brother    Cancer Father        prostate   Diabetes Brother    Heart disease Brother     Social History   Socioeconomic History   Marital status: Widowed    Spouse name: Not on file   Number of children: Not on file   Years of education: Not on file   Highest education level: Not on file  Occupational History   Occupation: Part-time at [employer]  Tobacco Use   Smoking status: Former    Current packs/day: 0.00    Types: Cigarettes    Quit date: 10/09/1975    Years since quitting: 48.6   Smokeless tobacco: Never  Vaping Use   Vaping status: Never Used  Substance and Sexual Activity   Alcohol use: No   Drug use: No   Sexual activity: Never    Birth control/protection: Surgical, Post-menopausal  Other Topics Concern   Not on file  Social History Narrative   Lives alone. Dtr in the area.   Social Drivers of Corporate investment banker Strain: Not on file  Food Insecurity: Not on file  Transportation Needs: Not on file  Physical Activity: Not on file  Stress: Not on file  Social Connections: Not on file  Intimate  Partner Violence: Not on file                                                                                                  Objective:  Physical Exam: BP 112/74   Pulse (!) 55   Temp 98 F (36.7 C)   Ht 5' 5 (1.651 m)   Wt 173 lb (78.5 kg)   SpO2  96%   BMI 28.79 kg/m    Physical Exam GENERAL: Alert, cooperative, well developed, no acute distress HEENT: Normocephalic, normal oropharynx, moist mucous membranes CHEST: Clear to auscultation bilaterally, No wheezes, rhonchi, or crackles CARDIOVASCULAR: Normal heart rate and rhythm, S1 and S2 normal without murmurs ABDOMEN: Soft, non-tender, non-distended, without organomegaly, Normal bowel sounds EXTREMITIES: No cyanosis or edema NEUROLOGICAL: Cranial nerves grossly intact, Moves all extremities without gross motor or sensory deficit  Previous PHQ had suicidal ideation. Patient denies any SI/HI at today's visit.   Physical Exam  CUP PACEART REMOTE DEVICE CHECK Result Date: 02/25/2024 Scheduled remote reviewed. Normal device function.  Presenting rhythm:  AP-VP. Next remote 91 days. - CS, CVRS   Recent Results (from the past 2160 hours)  CUP PACEART REMOTE DEVICE CHECK     Status: None   Collection Time: 02/23/24  5:11 AM  Result Value Ref Range   Date Time Interrogation Session [id number]    Pulse Generator Manufacturer [employer]    Pulse Gen Model 2272 Assurity MRI    Pulse Gen Serial Number [id number]    Clinic Name [facility]    Implantable Pulse Generator Type Implantable Pulse Generator    Implantable Pulse Generator Implant Date [id number]    Implantable Lead Manufacturer [facility]    Implantable Lead Model 1944 IsoFlex Optim    Implantable Lead Serial Number [id number]    Implantable Lead Implant Date [id number]    Implantable Lead Location [id number]    Implantable Lead Connection Status [id number]    Implantable Lead Manufacturer [facility]    Implantable Lead Model 1948 IsoFlex Optim    Implantable Lead Serial Number [id number]     Implantable Lead Implant Date 79859194    Implantable Lead Location [id number]    Implantable Lead Connection Status [id number]    Lead Channel Setting Sensing Sensitivity 4.0 mV   Lead Channel Setting Sensing Adaptation Mode Fixed Pacing    Lead Channel Setting Pacing Amplitude 2.5 V   Lead Channel Setting Pacing Pulse Width 0.5 ms   Lead Channel Setting Pacing Amplitude 2.5 V   Lead Channel Status NULL    Lead Channel Impedance Value 510 ohm   Lead Channel Sensing Intrinsic Amplitude 2.1 mV   Lead Channel Pacing Threshold Amplitude 0.25 V   Lead Channel Pacing Threshold Pulse Width 0.8 ms   Lead Channel Status NULL    Lead Channel Impedance Value 540 ohm   Lead Channel Sensing Intrinsic Amplitude 12.0 mV   Lead Channel Pacing Threshold Amplitude 0.75 V   Lead Channel Pacing Threshold Pulse Width 0.5 ms   Battery Status MOS    Battery Remaining Longevity 80 mo   Battery Remaining Percentage 88.0 %   Battery Voltage 2.98 V   Brady Statistic RA Percent Paced 68.0 %   Brady Statistic RV Percent Paced 99.0 %   Brady Statistic AP VP Percent 68.0 %   Brady Statistic AS VP Percent 31.0 %   Brady Statistic AP VS Percent 1.0 %   Brady Statistic AS VS Percent 1.0 %  Basic Metabolic Panel (BMET)     Status: Abnormal   Collection Time: 03/12/24 12:16 PM  Result Value Ref Range   Glucose 93 70 - 99 mg/dL   BUN 16 8 - 27 mg/dL   Creatinine, Ser 9.28 0.57 - 1.00 mg/dL   eGFR 84 >40 fO/fpw/8.26   BUN/Creatinine Ratio 23 12 - 28   Sodium 145 (H) 134 - 144 mmol/L   Potassium 4.5 3.5 - 5.2 mmol/L  Chloride 106 96 - 106 mmol/L   CO2 20 20 - 29 mmol/L   Calcium  9.5 8.7 - 10.3 mg/dL  CBC     Status: None   Collection Time: 03/12/24 12:16 PM  Result Value Ref Range   WBC 4.3 3.4 - 10.8 x10E3/uL   RBC 4.57 3.77 - 5.28 x10E6/uL   Hemoglobin 13.0 11.1 - 15.9 g/dL   Hematocrit 59.8 65.9 - 46.6 %   MCV 88 79 - 97 fL   MCH 28.4 26.6 - 33.0 pg   MCHC 32.4 31.5 - 35.7 g/dL   RDW 86.7 88.2 - 84.5 %    Platelets 195 150 - 450 x10E3/uL        Beverley Adine Hummer, MD, MS

## 2024-05-23 ENCOUNTER — Ambulatory Visit: Admitting: Family Medicine

## 2024-05-24 ENCOUNTER — Ambulatory Visit: Payer: Medicare PPO

## 2024-05-24 DIAGNOSIS — I421 Obstructive hypertrophic cardiomyopathy: Secondary | ICD-10-CM | POA: Diagnosis not present

## 2024-05-27 LAB — CUP PACEART REMOTE DEVICE CHECK
Battery Remaining Longevity: 77 mo
Battery Remaining Percentage: 85 %
Battery Voltage: 2.98 V
Brady Statistic AP VP Percent: 71 %
Brady Statistic AP VS Percent: 1 %
Brady Statistic AS VP Percent: 28 %
Brady Statistic AS VS Percent: 1 %
Brady Statistic RA Percent Paced: 70 %
Brady Statistic RV Percent Paced: 99 %
Date Time Interrogation Session: 20250718082222
Implantable Lead Connection Status: 753985
Implantable Lead Connection Status: 753985
Implantable Lead Implant Date: 20140805
Implantable Lead Implant Date: 20140805
Implantable Lead Location: 753859
Implantable Lead Location: 753860
Implantable Lead Model: 1944
Implantable Lead Model: 1948
Implantable Pulse Generator Implant Date: 20240119
Lead Channel Impedance Value: 490 Ohm
Lead Channel Impedance Value: 560 Ohm
Lead Channel Pacing Threshold Amplitude: 0.25 V
Lead Channel Pacing Threshold Amplitude: 0.75 V
Lead Channel Pacing Threshold Pulse Width: 0.5 ms
Lead Channel Pacing Threshold Pulse Width: 0.8 ms
Lead Channel Sensing Intrinsic Amplitude: 12 mV
Lead Channel Sensing Intrinsic Amplitude: 2.2 mV
Lead Channel Setting Pacing Amplitude: 2.5 V
Lead Channel Setting Pacing Amplitude: 2.5 V
Lead Channel Setting Pacing Pulse Width: 0.5 ms
Lead Channel Setting Sensing Sensitivity: 4 mV
Pulse Gen Model: 2272
Pulse Gen Serial Number: 8145790

## 2024-05-29 ENCOUNTER — Telehealth: Payer: Self-pay | Admitting: Family Medicine

## 2024-05-29 NOTE — Telephone Encounter (Signed)
 Copied from CRM #[id number]. Topic: Clinical - Medication Question >> May 29, 2024 12:14 PM Viola F wrote: Reason for CRM: Patient would like new script that she was previously on - it was a cream/ointment for rectum for pain and swelling/hemorrhoids. The over the counter cream is not working. Please call her  [phone] (M) she [facility] >> May 29, 2024 12:30 PM Roxie KIDD wrote: Pt requesting RX renewal

## 2024-05-30 ENCOUNTER — Encounter: Payer: Self-pay | Admitting: Family Medicine

## 2024-05-30 NOTE — Telephone Encounter (Signed)
 Pt was called she stated that she would check all her tubes and find out what it is called and call back. An appointment was recommended.

## 2024-06-03 NOTE — Telephone Encounter (Signed)
 Called patient and informed her that Dr. Sebastian has the refill request and it is pending his review. She stated that she just wanted something that she knows that helps with the swelling and pain. I informed her that someone will give her a call once Dr. Sebastian makes a decision. She thanked me for calling and all questions (if any) were answered.

## 2024-06-03 NOTE — Telephone Encounter (Signed)
 Copied from CRM [id number]. Topic: Clinical - Prescription Issue >> Jun 03, 2024  2:03 PM Alexandra Wheeler wrote: Reason for CRM: patient is calling to follow up on the medication request. She reached out via my chart there was no updates or response. Please give the patient at [phone] regarding concerns

## 2024-06-04 ENCOUNTER — Other Ambulatory Visit: Payer: Self-pay | Admitting: Family

## 2024-06-04 MED ORDER — HYDROCORTISONE 1 % EX CREA: 1.0000 | TOPICAL_CREAM | Freq: Two times a day (BID) | CUTANEOUS | 0 refills | Status: DC

## 2024-06-05 ENCOUNTER — Other Ambulatory Visit: Payer: Self-pay | Admitting: Family

## 2024-06-05 ENCOUNTER — Ambulatory Visit: Payer: Self-pay | Admitting: Cardiology

## 2024-06-05 NOTE — Telephone Encounter (Signed)
 Patient called and made aware that [facility]. She thanked me for calling.

## 2024-06-10 ENCOUNTER — Other Ambulatory Visit: Payer: Self-pay | Admitting: Family Medicine

## 2024-06-10 NOTE — Progress Notes (Signed)
 OFFICE NOTE:    Date:  06/11/2024  ID:  CHE BELOW, DOB [birth year], MRN [id number] PCP: Sebastian Beverley NOVAK, MD  [facility] Providers Cardiologist:  Stanly DELENA Leavens, MD Cardiology APP:  Lelon Glendia DASEN, PA-C  Electrophysiologist:  Soyla Gladis Norton, MD       Paroxysmal atrial fibrillation Apixaban  started in 10/2018 (AFib clinic)  Apixaban  DC'd in 08/2019: 0% AF on PPM interrogation  CHB, s/p pacemaker in 06/2013 Hypertension  Hyperlipidemia  LBBB GERD Coronary artery disease  Myoview  1/14: low risk Cath 8/14: Minimal plaque MPI 11/05/20: no ischemia  Hypertrophic CM Echocardiogram in 2014: asymmetric septal hypertrophy, MV SAM, mild MR, no sig LVOT at rest; LVOT 15 mmHg) beta-blocker initiated in 2014 No MRI due to PPM ETT 11/16: no high risk features  TTE 5/18: EF 60-65, Gr 1 DD, findings c/w HCM TTE  2/22: EF 60-65, no RWMA, severe asymmetric septal LVH, no LVOT, Gr 1 DD, mild SAM, mod MR, mild LAE, mod to severe TR, Asc Ao 42 mm, normal RVSF, RVSP 28  TTE 05/19/22: EF 60-65, mod ASH, mild MR, trivial AI, asc aorta 45 mm  CMR 10/03/23: Asymmetric LVH 15 mm (basal septum), post wall 8 mm, patchy LGE in basal septum and RV insertion site c/w HCM, EF 56, mod TR, mild to mod MR, asc aorta 43 mm TTE 11/13/23: EF 65-70, n oRWMA, severe ASH 18 mm, Gr 1 DD, NL RVSF, NL PASP, RVSP 33, mild LAE, mild to mod MR, mild to mod TR, mild AI; no LVOT at rest; mild SAM, no LVOT w Valsava, w repetitive squat/stand LVOT max gradient 52 mmHg, SAM Thoracic aortic aneurysm  CT 12/2022: Asc aorta 4.2 cm CMR 09/2023: 43 mm         Discussed the use of AI scribe software for clinical note transcription with the patient, who gave verbal consent to proceed. History of Present Illness Alexandra Wheeler is a 83 y.o. female for follow up of oHCM, AFib. She saw Dr. Leavens in 12/2023. Pt was having side effects to Diltiazem  and Eliquis . Metoprolol  succinate was added to her  regimen along with Diltiazem . Pt wanted to avoid any procedures for HCM. She was last seen 03/2024. She was still having palpitations. She never started the Metoprolol  succinate. I reassured her that she could start Metoprolol  succinate.   Her heart palpitations have improved significantly with metoprolol , experiencing fewer episodes. No shortness of breath, chest discomfort, or syncope. Home blood pressure readings are around 100/60.    Review of Systems  Gastrointestinal:  Negative for hematochezia and melena.  Genitourinary:  Negative for hematuria.  -See HPI    Studies Reviewed:       Risk Assessment/Calculations:  CHA2DS2-VASc Score = 5   This indicates a 7.2% annual risk of stroke. The patient's score is based upon: CHF History: 0 HTN History: 1 Diabetes History: 0 Stroke History: 0 Vascular Disease History: 1 Age Score: 2 Gender Score: 1           Physical Exam:  VS:  BP 113/75   Pulse 63   Ht 5' 5 (1.651 m)   Wt 175 lb 9.6 oz (79.7 kg)   SpO2 96%   BMI 29.22 kg/m        Wt Readings from Last 3 Encounters:  06/11/24 175 lb 9.6 oz (79.7 kg)  05/06/24 173 lb (78.5 kg)  04/22/24 173 lb 3.2 oz (78.6 kg)    Constitutional:  Appearance: Healthy appearance. Not in distress.  Neck:     Vascular: JVD normal.  Pulmonary:     Breath sounds: Normal breath sounds. No wheezing. No rales.  Cardiovascular:     Normal rate. Regular rhythm.     Murmurs: There is a grade 2/6 systolic murmur at the ULSB.  Edema:    Peripheral edema absent.  Abdominal:     Palpations: Abdomen is soft.       Assessment and Plan:    Assessment & Plan HOCM (hypertrophic obstructive cardiomyopathy) (HCC) Echocardiogram in January 2025 with severe asymmetric septal hypertrophy with mild systolic anterior motion (SAM) and no left ventricular outflow tract (LVOT) obstruction at rest. LVOT max gradient of 52 with repetitive squatting and standing.  She was started on Metoprolol  succinate  at last visit. Her palpitations are much improved. Overall, her symptoms are improved/stable.  - Continue Cardizem  CD 360 mg daily, Toprol -XL 25 mg daily Paroxysmal atrial fibrillation (HCC) Maintaining sinus rhythm by exam.  Continue Eliquis  5 mg twice daily, Cardizem  CD 360 mg daily, Toprol -XL 25 mg daily. Essential hypertension The patient's blood pressure is controlled on her current regimen.  Continue current therapy.          Dispo:  Return in about 6 months (around 12/12/2024) for Routine Follow Up, w/ Dr. Santo.  Signed, Glendia Ferrier, PA-C

## 2024-06-10 NOTE — Telephone Encounter (Unsigned)
 Copied from CRM [id number]. Topic: Clinical - Medication Refill >> Jun 10, 2024  3:56 PM Franky GRADE wrote: Medication: hydrocortisone  cream 1 % [[id number]]  Has the patient contacted their pharmacy? Yes (Agent: If no, request that the patient contact the pharmacy for the refill. If patient does not wish to contact the pharmacy document the reason why and proceed with request.) (Agent: If yes, when and what did the pharmacy advise?)  This is the patient's preferred pharmacy:    [phone] [place] [facility] [address] [place] [address] Phone: [phone] Fax: [phone]  Is this the correct pharmacy for this prescription? Yes If no, delete pharmacy and type the correct one.   Has the prescription been filled recently? No  Is the patient out of the medication? Yes  Has the patient been seen for an appointment in the last year OR does the patient have an upcoming appointment? Yes  Can we respond through MyChart? Yes  Agent: Please be advised that Rx refills may take up to 3 business days. We ask that you follow-up with your pharmacy.

## 2024-06-10 NOTE — Assessment & Plan Note (Signed)
 Echocardiogram in January 2025 with severe asymmetric septal hypertrophy with mild systolic anterior motion (SAM) and no left ventricular outflow tract (LVOT) obstruction at rest. LVOT max gradient of 52 with repetitive squatting and standing.  She was started on Metoprolol  succinate at last visit. Her palpitations are much improved. Overall, her symptoms are improved/stable.  - Continue Cardizem  CD 360 mg daily, Toprol -XL 25 mg daily

## 2024-06-11 ENCOUNTER — Ambulatory Visit: Attending: Physician Assistant | Admitting: Physician Assistant

## 2024-06-11 ENCOUNTER — Encounter: Payer: Self-pay | Admitting: Physician Assistant

## 2024-06-11 VITALS — BP 113/75 | HR 63 | Ht 65.0 in | Wt 175.6 lb

## 2024-06-11 DIAGNOSIS — I480 Paroxysmal atrial fibrillation: Secondary | ICD-10-CM | POA: Diagnosis not present

## 2024-06-11 DIAGNOSIS — I421 Obstructive hypertrophic cardiomyopathy: Secondary | ICD-10-CM | POA: Diagnosis not present

## 2024-06-11 DIAGNOSIS — I10 Essential (primary) hypertension: Secondary | ICD-10-CM

## 2024-06-11 MED ORDER — HYDROCORTISONE 1 % EX CREA: 1.0000 | TOPICAL_CREAM | Freq: Two times a day (BID) | CUTANEOUS | 0 refills | Status: AC

## 2024-06-11 NOTE — Patient Instructions (Signed)
 Medication Instructions:  Your physician recommends that you continue on your current medications as directed. Please refer to the Current Medication list given to you today.  *If you need a refill on your cardiac medications before your next appointment, please call your pharmacy*  Lab Work: None ordered  If you have labs (blood work) drawn today and your tests are completely normal, you will receive your results only by: MyChart Message (if you have MyChart) OR A paper copy in the mail If you have any lab test that is abnormal or we need to change your treatment, we will call you to review the results.  Testing/Procedures: None ordered  Follow-Up: At [facility], you and your health needs are our priority.  As part of our continuing mission to provide you with exceptional heart care, our providers are all part of one team.  This team includes your primary Cardiologist (physician) and Advanced Practice Providers or APPs (Physician Assistants and Nurse Practitioners) who all work together to provide you with the care you need, when you need it.  Your next appointment:   6 month(s)  Provider:   Stanly DELENA Leavens, MD    We recommend signing up for the patient portal called MyChart.  Sign up information is provided on this After Visit Summary.  MyChart is used to connect with patients for Virtual Visits (Telemedicine).  Patients are able to view lab/test results, encounter notes, upcoming appointments, etc.  Non-urgent messages can be sent to your provider as well.   To learn more about what you can do with MyChart, go to [email or web address].   Other Instructions

## 2024-06-11 NOTE — Assessment & Plan Note (Addendum)
The patient's blood pressure is controlled on her current regimen.  Continue current therapy.   

## 2024-06-11 NOTE — Assessment & Plan Note (Signed)
 Maintaining sinus rhythm by exam.  Continue Eliquis  5 mg twice daily, Cardizem  CD 360 mg daily, Toprol -XL 25 mg daily.

## 2024-07-10 DIAGNOSIS — M545 Low back pain, unspecified: Secondary | ICD-10-CM | POA: Diagnosis not present

## 2024-07-24 ENCOUNTER — Other Ambulatory Visit: Payer: Self-pay | Admitting: Internal Medicine

## 2024-07-24 DIAGNOSIS — I480 Paroxysmal atrial fibrillation: Secondary | ICD-10-CM

## 2024-07-24 NOTE — Telephone Encounter (Signed)
 Prescription refill request for Eliquis  received. Indication: HOCM/PAF Last office visit: 06/11/24  GORMAN Ferrier PA-C Scr: 0.71 on 03/12/24  Epic Age: 83 Weight: 79.7kg  Based on above findings Eliquis  5mg  twice daily is the appropriate dose.  Refill approved.

## 2024-08-05 ENCOUNTER — Ambulatory Visit

## 2024-08-05 VITALS — BP 102/74 | HR 63 | Temp 98.4°F | Ht 63.5 in | Wt 175.0 lb

## 2024-08-05 DIAGNOSIS — K219 Gastro-esophageal reflux disease without esophagitis: Secondary | ICD-10-CM

## 2024-08-05 DIAGNOSIS — M810 Age-related osteoporosis without current pathological fracture: Secondary | ICD-10-CM | POA: Diagnosis not present

## 2024-08-05 DIAGNOSIS — F419 Anxiety disorder, unspecified: Secondary | ICD-10-CM | POA: Diagnosis not present

## 2024-08-05 DIAGNOSIS — M7989 Other specified soft tissue disorders: Secondary | ICD-10-CM

## 2024-08-05 MED ORDER — FAMOTIDINE 20 MG PO TABS: 20.0000 mg | ORAL_TABLET | Freq: Every day | ORAL | 0 refills | Status: AC | PRN

## 2024-08-05 NOTE — Progress Notes (Signed)
 Subjective:    Patient ID: Alexandra Wheeler, female    DOB: January 09, 1941, 83 y.o.   MRN: [id number]   Alexandra Wheeler is a very pleasant 83 y.o. female who presents today as a new patient.  Past medical, surgical and family history: Reviewed and updated in chart.  Allergies: Reviewed and updated in chart.  Medications: Reviewed and updated in chart.  Social history: Reviewed and updated in chart.  Last PCP and reason for leaving: Last PCP died  BL ankle swelling, went to [address] and did a lot of walking, helps if she elevates legs. Having anxiety, only using atarax  at nighttime but it makes her dizzy.    Review of Systems  All other systems reviewed and are negative.       BP 102/74 (BP Location: Left Arm, Patient Position: Sitting, Cuff Size: Large)   Pulse 63   Temp 98.4 F (36.9 C) (Oral)   Ht 5' 3.5 (1.613 m)   Wt 175 lb (79.4 kg)   SpO2 93%   BMI 30.51 kg/m   Objective:    Physical Exam Vitals and nursing note reviewed.  Constitutional:      Appearance: Normal appearance.  HENT:     Head: Normocephalic and atraumatic.  Eyes:     Extraocular Movements: Extraocular movements intact.     Conjunctiva/sclera: Conjunctivae normal.  Musculoskeletal:     Right lower leg: Edema present.     Left lower leg: Edema present.  Skin:    General: Skin is warm.     Comments: Varicose veins visible over the LEs BL  Neurological:     Mental Status: She is alert.  Psychiatric:        Mood and Affect: Mood normal.        Behavior: Behavior normal.         Assessment & Plan:   1. Anxiety (Primary) Patient complains of daily anxiety, using Atarax  nightly but it is causing dizziness.  Extensively discussed with patient about efficacy of SSRIs in treating this, she reportedly has citalopram  at home, advised patient to send me a picture via MyChart to verify, counseled that she can restart this and discontinue the Atarax . - Patient to start Celexa  20 mg daily  2.  Osteoporosis, unspecified osteoporosis type, unspecified pathological fracture presence Per chart review, DEXA scan from 2021 did confirm osteoporosis, unclear which IV medication she was on, but reportedly completed a few years of treatment before discontinuing.  Will repeat DEXA scan as below, will determine based on fracture risk whether to consider prolonged treatment.  Counseled about weightbearing exercises, counseled about dairy/calcium  rich foods.  - DG Bone Density; Future - Continue calcium  and vitamin D  supplements  3. Leg swelling Patient does have varicose veins, reportedly having bilateral ankle swelling, will order compression stockings as below.  Counseled patient to wear them during the day and elevate feet every night for the next several weeks, after that can transition use of compression stockings to as needed.  - Compression stockings  4. Gastroesophageal reflux disease, unspecified whether esophagitis present Patient has been on Protonix  for several years, extensively counseled about risks of long-term PPI use, patient is agreeable to switch to Pepcid, regimen as below.  - famotidine (PEPCID) 20 MG tablet; Take 1 tablet (20 mg total) by mouth daily as needed for heartburn or indigestion. Take at least 10 mins before meals. Start with 1 tablet twice daily, after 2 weeks reduce to 1 tablet once daily. After 2  weeks discontinue and only use when you have reflux symptoms  Dispense: 90 tablet; Refill: 0   Return in about 2 weeks (around 08/19/2024) for Tremors.   Terie Lear K Grove Defina, MD  08/05/24

## 2024-08-05 NOTE — Patient Instructions (Addendum)
 Thank you for visiting [facility] today! Here's what we talked about: - Stop Protonix , Use pepcid as needed - Send me pictures of Vit D and calcium  supplements and Citalopram  - Pick up compression stockings and use daily, raise feet daily

## 2024-08-12 NOTE — Progress Notes (Signed)
 Remote PPM Transmission

## 2024-08-19 DIAGNOSIS — H40013 Open angle with borderline findings, low risk, bilateral: Secondary | ICD-10-CM | POA: Diagnosis not present

## 2024-08-19 DIAGNOSIS — H26492 Other secondary cataract, left eye: Secondary | ICD-10-CM | POA: Diagnosis not present

## 2024-08-19 DIAGNOSIS — Z961 Presence of intraocular lens: Secondary | ICD-10-CM | POA: Diagnosis not present

## 2024-08-20 ENCOUNTER — Ambulatory Visit: Payer: Self-pay

## 2024-08-20 NOTE — Telephone Encounter (Signed)
 FYI Only or Action Required?: Action required by provider: update on patient condition. New heartburn med not helping, pt would like recommendation. She will make an appt if she has to but wanting recommendations first  Patient was last seen in primary care on 08/05/2024 by Bennett Reuben POUR, MD.  Called Nurse Triage reporting Heartburn.  Symptoms began a week ago.  Interventions attempted: Prescription medications: pepcid, protonix   and Rest, hydration, or home remedies.  Symptoms are: gradually worsening.  Triage Disposition: Call PCP Within 24 Hours  Patient/caregiver understands and will follow disposition?: No, wishes to speak with PCP  Copied from CRM #[id number]. Topic: Clinical - Red Word Triage >> Aug 20, 2024 12:49 PM Sasha M wrote: Red Word that prompted transfer to Nurse Triage: nausea, sever heartburn and acid reflux going on a week. Currently prescribed pepcid and its not helping Reason for Disposition  [1] Patient says chest pain feels exactly the same as previously diagnosed heartburn AND [2] describes burning in chest AND [3] accompanying sour taste in mouth  Answer Assessment - Initial Assessment Questions Patient with chronic heartburn on protonix  long term. Patient was advised on 9/29 to come off protonix  and start pepcid for more appropriate long term management. Patient for the last week has been having terrible heartburn. She will have reflux into her mouth with bending over. Feels like her esophagus is burning all the way up into her throat. Had to take a left over Protonix  today. Unsure if benefit yet. Water seems to help. Able to sleep but woken in the middle of the night with burning.  UC/ED precautions given and understood.   1. LOCATION: Where does it hurt?       All the way down her esophagus into her stomach, chest discomfort 2. RADIATION: Does the pain go anywhere else? (e.g., into neck, jaw, arms, back)     Up into the back of her throat 3. ONSET: When  did the chest pain begin? (Minutes, hours or days)      About a week- switched off Protonix  to Pepcid 4. PATTERN: Does the pain come and go, or has it been constant since it started?  Does it get worse with exertion?      Coming and going- worse with bending over 5. DURATION: How long does it last (e.g., seconds, minutes, hours)     Depends what she does- minutes  6. SEVERITY: How bad is the pain?  (e.g., Scale 1-10; mild, moderate, or severe)     Heartburn pain hits 8-9/10 7. CARDIAC RISK FACTORS: Do you have any history of heart problems or risk factors for heart disease? (e.g., angina, prior heart attack; diabetes, high blood pressure, high cholesterol, smoker, or strong family history of heart disease)     CHF, HTN, Pacer 8. PULMONARY RISK FACTORS: Do you have any history of lung disease?  (e.g., blood clots in lung, asthma, emphysema, birth control pills)     denies 9. CAUSE: What do you think is causing the chest pain?     Chronic heartburn  10. OTHER SYMPTOMS: Do you have any other symptoms? (e.g., dizziness, nausea, vomiting, sweating, fever, difficulty breathing, cough)       Nausea, chest discomfort from heartburn  Protocols used: Chest Pain-A-AH

## 2024-08-21 ENCOUNTER — Telehealth: Payer: Self-pay

## 2024-08-21 ENCOUNTER — Ambulatory Visit: Payer: Self-pay | Admitting: Family Medicine

## 2024-08-21 ENCOUNTER — Ambulatory Visit: Admitting: Nurse Practitioner

## 2024-08-21 VITALS — BP 100/80 | HR 60 | Temp 98.5°F | Ht 63.5 in | Wt 176.6 lb

## 2024-08-21 DIAGNOSIS — R1010 Upper abdominal pain, unspecified: Secondary | ICD-10-CM

## 2024-08-21 DIAGNOSIS — K219 Gastro-esophageal reflux disease without esophagitis: Secondary | ICD-10-CM

## 2024-08-21 DIAGNOSIS — R0789 Other chest pain: Secondary | ICD-10-CM | POA: Diagnosis not present

## 2024-08-21 MED ORDER — PANTOPRAZOLE SODIUM 20 MG PO TBEC: 20.0000 mg | DELAYED_RELEASE_TABLET | Freq: Every day | ORAL | 0 refills | Status: DC

## 2024-08-21 NOTE — Patient Instructions (Signed)
 Nice to see you today  I will be in touch with the labs once I have them  Follow up with Dr. Bennett as scheduled/need   EKG was ok in office

## 2024-08-21 NOTE — Telephone Encounter (Signed)
 This has been completed and PCP was already updated in EPIC.

## 2024-08-21 NOTE — Telephone Encounter (Signed)
 Copied from CRM #[id number]. Topic: Clinical - Red Word Triage >> Aug 21, 2024 11:25 AM Alfonso ORN wrote: Red Word that prompted transfer to Nurse Triage: pt calling back for advicxe on chest pains from triage call yesterday message sent to old pcp office.

## 2024-08-21 NOTE — Telephone Encounter (Signed)
 I checked the message that was taken yesterday and nothing was mentioned about chest pain. Pt is having issues with heartburn.

## 2024-08-21 NOTE — Progress Notes (Signed)
 Established Patient Office Visit  Subjective   Patient ID: Alexandra Wheeler, female    DOB: [birth year]  Age: 83 y.o. MRN: [id number]  Chief Complaint  Patient presents with   Heartburn    Pt complains of acid reflux from throat down to her stomach. Ongoing for 2 weeks.     Discussed the use of AI scribe software for clinical note transcription with the patient, who gave verbal consent to proceed.  History of Present Illness QUANDRA FEDORCHAK is an 83 year old female with atrial fibrillation and hypertrophic cardiomyopathy who presents with chest discomfort and acid reflux symptoms.  She has been experiencing acid reflux symptoms for the past couple of weeks, beginning around the first of last week. The pain is described as a stabbing sensation located underneath the ribs, radiating to the back, and sometimes felt behind the breast. The discomfort worsens after eating, particularly with certain foods like chocolate and coffee. She reports that sometimes lying in bed can worsen the symptoms, but generally position does not make a difference. She has experienced episodes of burping and regurgitation.  She has a history of using Protonix  (pantoprazole ) 40 mg as needed but switched to Pepcid (famotidine) due to concerns about long-term use of Protonix . Pepcid has not been effective. Previously, she would sometimes go a month without needing Protonix , but the current symptoms appeared suddenly and have been persistent.  In addition to the reflux symptoms, she experiences hot flashes and occasional nausea. No fever, chills, or vomiting. Her bowel movements are regular, aided by the use of Miralax.  Her past medical history includes atrial fibrillation, hypertrophic cardiomyopathy (HOCM), and a complete heart block for which she has a pacemaker. She follows up with her cardiologist every six months, with her last visit in July. She denies any shortness of breath associated with the reflux symptoms,  although she experiences it with her heart condition.  Denies alcohol and NSAID use     Review of Systems  Eyes:  Positive for discharge.  Respiratory:  Negative for shortness of breath.   Cardiovascular:  Positive for chest pain.  Gastrointestinal:  Positive for abdominal pain, heartburn and nausea. Negative for constipation and vomiting.        Bm daily       Objective:     BP 100/80   Pulse 60   Temp 98.5 F (36.9 C) (Oral)   Ht 5' 3.5 (1.613 m)   Wt 176 lb 9.6 oz (80.1 kg)   SpO2 98%   BMI 30.79 kg/m  BP Readings from Last 3 Encounters:  08/21/24 100/80  08/05/24 102/74  06/11/24 113/75   Wt Readings from Last 3 Encounters:  08/21/24 176 lb 9.6 oz (80.1 kg)  08/05/24 175 lb (79.4 kg)  06/11/24 175 lb 9.6 oz (79.7 kg)   SpO2 Readings from Last 3 Encounters:  08/21/24 98%  08/05/24 93%  06/11/24 96%      Physical Exam Vitals and nursing note reviewed.  Constitutional:      Appearance: Normal appearance.  Cardiovascular:     Rate and Rhythm: Normal rate and regular rhythm.     Heart sounds: Normal heart sounds.  Pulmonary:     Effort: Pulmonary effort is normal.     Breath sounds: Normal breath sounds.  Abdominal:     General: Bowel sounds are normal. There is no distension.     Palpations: There is no mass.     Tenderness: There is no abdominal tenderness.  Hernia: No hernia is present.  Neurological:     Mental Status: She is alert.      No results found for any visits on 08/21/24.    The ASCVD Risk score (Arnett DK, et al., 2019) failed to calculate for the following reasons:   The 2019 ASCVD risk score is only valid for ages 57 to 24   Risk score cannot be calculated because patient has a medical history suggesting prior/existing ASCVD    Assessment & Plan:   Problem List Items Addressed This Visit       Digestive   GERD (gastroesophageal reflux disease)   Relevant Medications   pantoprazole  (PROTONIX ) 20 MG tablet   Other  Visit Diagnoses       Atypical chest pain    -  Primary   Relevant Orders   CBC   Comprehensive metabolic panel with GFR   Lipase   EKG 12-Lead   Troponin I (High Sensitivity)     Upper abdominal pain       Relevant Orders   CBC   Comprehensive metabolic panel with GFR   Lipase      Assessment and Plan Assessment & Plan Gastroesophageal reflux disease (GERD) Intermittent chest pain and discomfort radiating from the stomach to the back, worsened by eating and lying down. Symptoms include stabbing pain, increased burping, and occasional nausea. Symptoms worsened after discontinuing pantoprazole  and switching to ineffective famotidine. Differential includes cardiac issues due to atrial fibrillation and hypertrophic obstructive cardiomyopathy history. - Order blood work to rule out cardiac causes. - Perform EKG to assess cardiac function. -protonix  20mg  daily for 2 weeks then trial off  Atrial fibrillation Regular cardiology follow-ups every six months.  Hypertrophic obstructive cardiomyopathy (HOCM) Regular cardiology follow-ups every six months.  Presence of cardiac pacemaker Cardiac pacemaker due to complete heart block. Regular cardiology follow-ups every six months.  Return if symptoms worsen or fail to improve, for with Dr. Bennett.    Adina Crandall, NP

## 2024-08-21 NOTE — Telephone Encounter (Signed)
 Copied from CRM #[id number]. Topic: Appointments - Transfer of Care >> Aug 21, 2024 11:22 AM Alfonso ORN wrote: Pt is requesting to transfer FROM: Sebastian Righter Pt is requesting to transfer TO: Reuben Burkes Reason for requested transfer: new pt appt already completed  It is the responsibility of the team the patient would like to transfer to (Dr. Burkes) to reach out to the patient if for any reason this transfer is not acceptable.   Patient has already switched over to Dr. Burkes. Please update patient PCP.

## 2024-08-21 NOTE — Telephone Encounter (Signed)
 FYI Only or Action Required?: FYI only for provider.  Patient was last seen in primary care on 08/05/2024 by Bennett Reuben POUR, MD.  Called Nurse Triage reporting Heartburn.  Symptoms began a week ago.  Triage Disposition: See HCP Within 4 Hours (Or PCP Triage)  Patient/caregiver understands and will follow disposition?: Yes     Copied from CRM #[id number]. Topic: Clinical - Medical Advice >> Aug 21, 2024 11:18 AM Alfonso ORN wrote: Reason for CRM: medications advised not working called back for update on what to do regarding triage call yesterday. Message was sent to old provider yesterday but she is now seeing Dr Bennett as of last month ([facility]) Reason for Disposition  [1] MILD-MODERATE pain AND [2] constant AND [3] present > 2 hours  Answer Assessment - Initial Assessment Questions This RN scheduled pt for an appointment today in office.  Dr. Bennett recommended she takes Pepcid which is not working. Pt states she tried that all last week. Pt was told on MyChart to take two different medications. Pt states this is not a usual heartburn. It radiates to pt's back and pt wants an appointment.  Onset: one week ago  Protocols used: Chest Pain-A-AH, Abdominal Pain - Upper-A-AH

## 2024-08-22 LAB — COMPREHENSIVE METABOLIC PANEL WITH GFR
ALT: 16 U/L (ref 0–35)
AST: 16 U/L (ref 0–37)
Albumin: 4.1 g/dL (ref 3.5–5.2)
Alkaline Phosphatase: 106 U/L (ref 39–117)
BUN: 21 mg/dL (ref 6–23)
CO2: 27 meq/L (ref 19–32)
Calcium: 9 mg/dL (ref 8.4–10.5)
Chloride: 107 meq/L (ref 96–112)
Creatinine, Ser: 1.03 mg/dL (ref 0.40–1.20)
GFR: 50.14 mL/min — ABNORMAL LOW (ref >60.00)
Glucose, Bld: 102 mg/dL — ABNORMAL HIGH (ref 70–99)
Potassium: 4.3 meq/L (ref 3.5–5.1)
Sodium: 143 meq/L (ref 135–145)
Total Bilirubin: 0.2 mg/dL (ref 0.2–1.2)
Total Protein: 6.6 g/dL (ref 6.0–8.3)

## 2024-08-22 LAB — LIPASE: Lipase: 28 U/L (ref 11.0–59.0)

## 2024-08-22 LAB — CBC
HCT: 38.9 % (ref 36.0–46.0)
Hemoglobin: 12.4 g/dL (ref 12.0–15.0)
MCHC: 31.9 g/dL (ref 30.0–36.0)
MCV: 87.4 fl (ref 78.0–100.0)
Platelets: 192 K/uL (ref 150.0–400.0)
RBC: 4.45 Mil/uL (ref 3.87–5.11)
RDW: 13.5 % (ref 11.5–15.5)
WBC: 4.9 K/uL (ref 4.0–10.5)

## 2024-08-22 LAB — TROPONIN I (HIGH SENSITIVITY): High Sens Troponin I: 6 ng/L (ref 2–17)

## 2024-08-22 MED ORDER — OMEPRAZOLE 40 MG PO CPDR: 40.0000 mg | DELAYED_RELEASE_CAPSULE | Freq: Every day | ORAL | 0 refills | Status: AC

## 2024-08-22 NOTE — Telephone Encounter (Signed)
 Addressed in another encounter

## 2024-08-22 NOTE — Telephone Encounter (Signed)
 Please call patient and advise that omeprazole has been sent to her pharmacy since Pepcid is not working, she should take 1 tablet once a day.

## 2024-08-22 NOTE — Telephone Encounter (Unsigned)
 Copied from CRM [id number]. Topic: General - Other >> Aug 22, 2024 10:50 AM Macario HERO wrote: Reason for CRM: Patient called regarding missed call from [facility]. Relayed message left about medication.

## 2024-08-22 NOTE — Telephone Encounter (Signed)
Routing to nursing to call patient

## 2024-08-22 NOTE — Telephone Encounter (Signed)
 Left message to call office.  Please provide  message from provider/office when call is returned from patient.

## 2024-08-23 ENCOUNTER — Ambulatory Visit: Payer: Self-pay | Admitting: Nurse Practitioner

## 2024-08-23 ENCOUNTER — Ambulatory Visit (INDEPENDENT_AMBULATORY_CARE_PROVIDER_SITE_OTHER): Payer: Medicare PPO

## 2024-08-23 DIAGNOSIS — I421 Obstructive hypertrophic cardiomyopathy: Secondary | ICD-10-CM

## 2024-08-23 DIAGNOSIS — M48062 Spinal stenosis, lumbar region with neurogenic claudication: Secondary | ICD-10-CM | POA: Diagnosis not present

## 2024-08-27 LAB — CUP PACEART REMOTE DEVICE CHECK
Battery Remaining Longevity: 76 mo
Battery Remaining Percentage: 81 %
Battery Voltage: 2.98 V
Brady Statistic AP VP Percent: 74 %
Brady Statistic AP VS Percent: 1 %
Brady Statistic AS VP Percent: 25 %
Brady Statistic AS VS Percent: 1 %
Brady Statistic RA Percent Paced: 74 %
Brady Statistic RV Percent Paced: 99 %
Date Time Interrogation Session: 20251017040014
Implantable Lead Connection Status: 753985
Implantable Lead Connection Status: 753985
Implantable Lead Implant Date: 20140805
Implantable Lead Implant Date: 20140805
Implantable Lead Location: 753859
Implantable Lead Location: 753860
Implantable Lead Model: 1944
Implantable Lead Model: 1948
Implantable Pulse Generator Implant Date: 20240119
Lead Channel Impedance Value: 560 Ohm
Lead Channel Impedance Value: 590 Ohm
Lead Channel Pacing Threshold Amplitude: 0.25 V
Lead Channel Pacing Threshold Amplitude: 0.75 V
Lead Channel Pacing Threshold Pulse Width: 0.5 ms
Lead Channel Pacing Threshold Pulse Width: 0.8 ms
Lead Channel Sensing Intrinsic Amplitude: 12 mV
Lead Channel Sensing Intrinsic Amplitude: 3 mV
Lead Channel Setting Pacing Amplitude: 2.5 V
Lead Channel Setting Pacing Amplitude: 2.5 V
Lead Channel Setting Pacing Pulse Width: 0.5 ms
Lead Channel Setting Sensing Sensitivity: 4 mV
Pulse Gen Model: 2272
Pulse Gen Serial Number: 8145790

## 2024-08-29 NOTE — Progress Notes (Signed)
 Remote PPM Transmission

## 2024-09-03 ENCOUNTER — Ambulatory Visit: Payer: Self-pay | Admitting: Cardiology

## 2024-09-11 ENCOUNTER — Other Ambulatory Visit: Payer: Self-pay | Admitting: Physical Medicine and Rehabilitation

## 2024-09-11 DIAGNOSIS — M545 Low back pain, unspecified: Secondary | ICD-10-CM

## 2024-09-27 NOTE — Discharge Instructions (Signed)

## 2024-09-30 ENCOUNTER — Ambulatory Visit
Admission: RE | Admit: 2024-09-30 | Discharge: 2024-09-30 | Disposition: A | Source: Ambulatory Visit | Attending: Physical Medicine and Rehabilitation | Admitting: Physical Medicine and Rehabilitation

## 2024-09-30 ENCOUNTER — Ambulatory Visit
Admission: RE | Admit: 2024-09-30 | Discharge: 2024-09-30 | Disposition: A | Discharge status: Home / Self Care | Source: Ambulatory Visit | Attending: Physical Medicine and Rehabilitation | Admitting: Physical Medicine and Rehabilitation

## 2024-09-30 DIAGNOSIS — M47817 Spondylosis without myelopathy or radiculopathy, lumbosacral region: Secondary | ICD-10-CM | POA: Diagnosis not present

## 2024-09-30 DIAGNOSIS — M4316 Spondylolisthesis, lumbar region: Secondary | ICD-10-CM | POA: Diagnosis not present

## 2024-09-30 DIAGNOSIS — M545 Low back pain, unspecified: Secondary | ICD-10-CM

## 2024-09-30 DIAGNOSIS — M48061 Spinal stenosis, lumbar region without neurogenic claudication: Secondary | ICD-10-CM | POA: Diagnosis not present

## 2024-09-30 DIAGNOSIS — M5126 Other intervertebral disc displacement, lumbar region: Secondary | ICD-10-CM | POA: Diagnosis not present

## 2024-09-30 MED ORDER — IOPAMIDOL (ISOVUE-M 200) INJECTION 41%
18.0000 mL | Freq: Once | INTRAMUSCULAR | Status: AC
  Administered 2024-09-30: 18 mL via INTRATHECAL

## 2024-09-30 MED ORDER — DIAZEPAM 5 MG PO TABS
5.0000 mg | ORAL_TABLET | Freq: Once | ORAL | Status: AC
  Administered 2024-09-30: 5 mg via ORAL

## 2024-09-30 MED ORDER — ONDANSETRON HCL 4 MG/2ML IJ SOLN: 4.0000 mg | Freq: Once | INTRAMUSCULAR | Status: DC | PRN

## 2024-10-07 DIAGNOSIS — M47816 Spondylosis without myelopathy or radiculopathy, lumbar region: Secondary | ICD-10-CM | POA: Diagnosis not present

## 2024-10-16 DIAGNOSIS — M47816 Spondylosis without myelopathy or radiculopathy, lumbar region: Secondary | ICD-10-CM | POA: Diagnosis not present

## 2024-10-21 ENCOUNTER — Telehealth: Payer: Self-pay

## 2024-10-21 NOTE — Telephone Encounter (Signed)
 Patient is past due for annual follow up for PPM management.  Also, noting some increase in AF burden of recent as well.   Recommend APP or [facility] follow up in next 3-4 weeks.   Forwarding to EP scheduling.

## 2024-10-28 ENCOUNTER — Other Ambulatory Visit: Payer: Self-pay | Admitting: Internal Medicine

## 2024-10-29 MED ORDER — DILTIAZEM HCL ER COATED BEADS 360 MG PO CP24: 360.0000 mg | ORAL_CAPSULE | Freq: Every day | ORAL | 2 refills | Status: DC

## 2024-11-01 NOTE — Telephone Encounter (Signed)
 Spoke w/ patient - she is scheduled to see Jodie Passey, PA on 1/27.

## 2024-11-13 ENCOUNTER — Other Ambulatory Visit: Payer: Self-pay

## 2024-11-13 DIAGNOSIS — Z1231 Encounter for screening mammogram for malignant neoplasm of breast: Secondary | ICD-10-CM

## 2024-11-18 ENCOUNTER — Ambulatory Visit: Payer: Medicare PPO | Admitting: Nurse Practitioner

## 2024-11-22 ENCOUNTER — Ambulatory Visit: Payer: Self-pay | Admitting: Cardiology

## 2024-11-22 ENCOUNTER — Ambulatory Visit: Payer: Medicare PPO

## 2024-11-22 DIAGNOSIS — I421 Obstructive hypertrophic cardiomyopathy: Secondary | ICD-10-CM

## 2024-11-22 LAB — CUP PACEART REMOTE DEVICE CHECK
Battery Remaining Longevity: 73 mo
Battery Remaining Percentage: 78 %
Battery Voltage: 2.98 V
Brady Statistic AP VP Percent: 76 %
Brady Statistic AP VS Percent: 1 %
Brady Statistic AS VP Percent: 23 %
Brady Statistic AS VS Percent: 1 %
Brady Statistic RA Percent Paced: 74 %
Brady Statistic RV Percent Paced: 99 %
Date Time Interrogation Session: 20260116040014
Implantable Lead Connection Status: 753985
Implantable Lead Connection Status: 753985
Implantable Lead Implant Date: 20140805
Implantable Lead Implant Date: 20140805
Implantable Lead Location: 753859
Implantable Lead Location: 753860
Implantable Lead Model: 1944
Implantable Lead Model: 1948
Implantable Pulse Generator Implant Date: 20240119
Lead Channel Impedance Value: 580 Ohm
Lead Channel Impedance Value: 590 Ohm
Lead Channel Pacing Threshold Amplitude: 0.25 V
Lead Channel Pacing Threshold Amplitude: 0.75 V
Lead Channel Pacing Threshold Pulse Width: 0.5 ms
Lead Channel Pacing Threshold Pulse Width: 0.8 ms
Lead Channel Sensing Intrinsic Amplitude: 12 mV
Lead Channel Sensing Intrinsic Amplitude: 2.8 mV
Lead Channel Setting Pacing Amplitude: 2.5 V
Lead Channel Setting Pacing Amplitude: 2.5 V
Lead Channel Setting Pacing Pulse Width: 0.5 ms
Lead Channel Setting Sensing Sensitivity: 4 mV
Pulse Gen Model: 2272
Pulse Gen Serial Number: 8145790

## 2024-11-28 NOTE — Progress Notes (Signed)
 Remote PPM Transmission

## 2024-12-02 ENCOUNTER — Ambulatory Visit

## 2024-12-02 ENCOUNTER — Telehealth: Payer: Self-pay | Admitting: Internal Medicine

## 2024-12-02 NOTE — Telephone Encounter (Signed)
 Call made to patient to confirm/reschedule her 1/27 appointment with EP APP, she is rescheduled to 3/10 with EP APP but reports BP issues and wanted to notify Dr. Santo  Pt c/o BP issue: STAT if pt c/o blurred vision, one-sided weakness or slurred speech  1. What are your last 5 BP readings?  Friday with patients home health nurse:  90/57  109/79 104/72 Readings on Sunday 1/25:  117/83  126/87 155/80 125/78  2. Are you having any other symptoms (ex. Dizziness, headache, blurred vision, passed out)? Reports no symptoms   3. What is your BP issue? Due to blood pressure being low, patient hasn't taken her diltiazem  or metoprolol  in 2 nights. She states she feels better when she doesn't take it.

## 2024-12-03 ENCOUNTER — Ambulatory Visit: Admitting: Student

## 2024-12-03 ENCOUNTER — Other Ambulatory Visit: Payer: Self-pay

## 2024-12-03 NOTE — Progress Notes (Signed)
 Medication list updated.

## 2024-12-03 NOTE — Telephone Encounter (Signed)
 Called pt to relay the message. She verbalized understanding. Medication list updated.

## 2024-12-03 NOTE — Telephone Encounter (Signed)
 Patient identification verified by 2 forms.   Called and spoke to patient  Patient states:  Blood pressure yesterday  131/85 81 110/81 74 122/86 66  -Pt stopped taking Metoprolol  and Diltiazem  Friday.  -I was weak, dizzy/lightheaded, SOB when taking the medications.  -I feel a whole lot better not taking that medicine.   Patient denies:  -Symptoms since stopping the medications.    Interventions/Plan: -Will get message to Dr. Santo for review.   Reviewed ED warning signs/precautions  Patient agrees with plan, no questions at this time

## 2024-12-06 NOTE — Telephone Encounter (Signed)
 Pt c/o BP issue: STAT if pt c/o blurred vision, one-sided weakness or slurred speech.  STAT if BP is GREATER than 180/120 TODAY.  STAT if BP is LESS than 90/60 and SYMPTOMATIC TODAY  1. What is your BP concern? Pt called back in about BP, she wants to know what to do with her medications   2. Have you taken any BP medication today? Yes   3. What are your last 5 BP readings?  131/102 - last night  126/96 hr 84 - this morning   4. Are you having any other symptoms (ex. Dizziness, headache, blurred vision, passed out)? No

## 2024-12-06 NOTE — Telephone Encounter (Signed)
 Patient reports BP last night was 131/102, she also had a headache. Patient states she took diltiazem  360 mg at that time.  Earlier this morning her BP was 126/96, and a few minutes before this conversation she rechecked BP with reading of 108/85, HR 72. She currently denies any headache, dizziness, blurred vision, SOB, or CP.  Per previous phone note from triage nurse on 12/03/24: -Pt stopped taking Metoprolol  and Diltiazem  Friday.  -I was weak, dizzy/lightheaded, SOB when taking the medications.  -I feel a whole lot better not taking that medicine.  Response from Dr. Helon on 12/03/24: Patient with [facility] who stopped her AV nodal agents with improvement in symptoms. - Let's change the MAR, to reflect her wishes - she has a history of CHADSVASC NA AF.  Has declined AC (see Dr. Manford note).  If AF returns may need to rechallenge AV nodal agents.  Would also re-recommend AC given HCM - if oHCM sx occur we would have her see Tessa or Me to consider CMI therapy.   Stanly Leavens, MD FASE [facility] Cardiologist [facility]  [address] [place], [place] [address] [phone]  2:34 PM  Will forward to Dr. Leavens for further advisement.   Informed patient Dr. Robbin is out of the office and a colleague is covering his basket, we will follow-up when we receive a response. Notified patient of on-call provider availability for the weekend and advised on ED precautions if we do not get a response today, patient verbalized understanding.

## 2024-12-09 ENCOUNTER — Ambulatory Visit

## 2024-12-10 ENCOUNTER — Ambulatory Visit

## 2024-12-18 ENCOUNTER — Ambulatory Visit

## 2025-01-14 ENCOUNTER — Ambulatory Visit: Admitting: Cardiology

## 2025-02-21 ENCOUNTER — Ambulatory Visit

## 2025-05-23 ENCOUNTER — Ambulatory Visit

## 2025-08-22 ENCOUNTER — Ambulatory Visit

## 2025-11-21 ENCOUNTER — Ambulatory Visit

## 2026-02-20 ENCOUNTER — Ambulatory Visit
